# Patient Record
Sex: Male | Born: 1955 | Race: Black or African American | Hispanic: No | Marital: Single | Smoking: Current every day smoker
Health system: Southern US, Community
[De-identification: ages and names within clinical notes are randomized; demographics above are authoritative.]

## PROBLEM LIST (undated history)

## (undated) DIAGNOSIS — K5790 Diverticulosis of intestine, part unspecified, without perforation or abscess without bleeding: Secondary | ICD-10-CM

## (undated) DIAGNOSIS — T783XXA Angioneurotic edema, initial encounter: Secondary | ICD-10-CM

## (undated) DIAGNOSIS — I639 Cerebral infarction, unspecified: Secondary | ICD-10-CM

## (undated) DIAGNOSIS — M1990 Unspecified osteoarthritis, unspecified site: Secondary | ICD-10-CM

## (undated) DIAGNOSIS — D649 Anemia, unspecified: Secondary | ICD-10-CM

## (undated) DIAGNOSIS — F079 Unspecified personality and behavioral disorder due to known physiological condition: Secondary | ICD-10-CM

## (undated) DIAGNOSIS — R569 Unspecified convulsions: Secondary | ICD-10-CM

## (undated) DIAGNOSIS — E119 Type 2 diabetes mellitus without complications: Secondary | ICD-10-CM

## (undated) DIAGNOSIS — R55 Syncope and collapse: Secondary | ICD-10-CM

## (undated) DIAGNOSIS — E785 Hyperlipidemia, unspecified: Secondary | ICD-10-CM

## (undated) DIAGNOSIS — R609 Edema, unspecified: Secondary | ICD-10-CM

## (undated) DIAGNOSIS — I4901 Ventricular fibrillation: Secondary | ICD-10-CM

## (undated) DIAGNOSIS — F1010 Alcohol abuse, uncomplicated: Secondary | ICD-10-CM

## (undated) DIAGNOSIS — F418 Other specified anxiety disorders: Secondary | ICD-10-CM

## (undated) DIAGNOSIS — K861 Other chronic pancreatitis: Secondary | ICD-10-CM

## (undated) DIAGNOSIS — K2970 Gastritis, unspecified, without bleeding: Secondary | ICD-10-CM

## (undated) DIAGNOSIS — K635 Polyp of colon: Secondary | ICD-10-CM

## (undated) DIAGNOSIS — I10 Essential (primary) hypertension: Secondary | ICD-10-CM

## (undated) DIAGNOSIS — G8929 Other chronic pain: Secondary | ICD-10-CM

## (undated) DIAGNOSIS — J449 Chronic obstructive pulmonary disease, unspecified: Secondary | ICD-10-CM

## (undated) DIAGNOSIS — I219 Acute myocardial infarction, unspecified: Secondary | ICD-10-CM

## (undated) DIAGNOSIS — J189 Pneumonia, unspecified organism: Secondary | ICD-10-CM

## (undated) DIAGNOSIS — K750 Abscess of liver: Secondary | ICD-10-CM

## (undated) DIAGNOSIS — K4090 Unilateral inguinal hernia, without obstruction or gangrene, not specified as recurrent: Secondary | ICD-10-CM

## (undated) DIAGNOSIS — K7460 Unspecified cirrhosis of liver: Secondary | ICD-10-CM

## (undated) DIAGNOSIS — Z8673 Personal history of transient ischemic attack (TIA), and cerebral infarction without residual deficits: Secondary | ICD-10-CM

## (undated) DIAGNOSIS — G931 Anoxic brain damage, not elsewhere classified: Secondary | ICD-10-CM

## (undated) DIAGNOSIS — F17200 Nicotine dependence, unspecified, uncomplicated: Secondary | ICD-10-CM

## (undated) DIAGNOSIS — K219 Gastro-esophageal reflux disease without esophagitis: Secondary | ICD-10-CM

## (undated) HISTORY — DX: Cerebral infarction, unspecified: I63.9

## (undated) HISTORY — DX: Diverticulosis of intestine, part unspecified, without perforation or abscess without bleeding: K57.90

## (undated) HISTORY — DX: Unspecified cirrhosis of liver: K74.60

## (undated) HISTORY — DX: Gastro-esophageal reflux disease without esophagitis: K21.9

## (undated) HISTORY — DX: Anemia, unspecified: D64.9

## (undated) HISTORY — DX: Gastritis, unspecified, without bleeding: K29.70

## (undated) HISTORY — DX: Hyperlipidemia, unspecified: E78.5

## (undated) HISTORY — DX: Ventricular fibrillation: I49.01

## (undated) HISTORY — DX: Abscess of liver: K75.0

## (undated) HISTORY — DX: Acute myocardial infarction, unspecified: I21.9

## (undated) HISTORY — DX: Nicotine dependence, unspecified, uncomplicated: F17.200

## (undated) HISTORY — PX: OTHER SURGICAL HISTORY: SHX169

## (undated) HISTORY — DX: Unspecified osteoarthritis, unspecified site: M19.90

## (undated) HISTORY — DX: Personal history of transient ischemic attack (TIA), and cerebral infarction without residual deficits: Z86.73

## (undated) HISTORY — DX: Alcohol abuse, uncomplicated: F10.10

## (undated) HISTORY — DX: Unilateral inguinal hernia, without obstruction or gangrene, not specified as recurrent: K40.90

## (undated) HISTORY — DX: Unspecified personality and behavioral disorder due to known physiological condition: F07.9

## (undated) HISTORY — DX: Chronic obstructive pulmonary disease, unspecified: J44.9

## (undated) HISTORY — DX: Other chronic pancreatitis: K86.1

## (undated) HISTORY — DX: Unspecified convulsions: R56.9

## (undated) HISTORY — DX: Other chronic pain: G89.29

## (undated) HISTORY — DX: Anoxic brain damage, not elsewhere classified: G93.1

## (undated) HISTORY — DX: Angioneurotic edema, initial encounter: T78.3XXA

## (undated) HISTORY — DX: Edema, unspecified: R60.9

## (undated) HISTORY — DX: Polyp of colon: K63.5

## (undated) HISTORY — DX: Essential (primary) hypertension: I10

---
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
## 2007-07-28 ENCOUNTER — Inpatient Hospital Stay (HOSPITAL_COMMUNITY): Admission: EM | Admit: 2007-07-28 | Discharge: 2007-08-01 | Payer: Self-pay | Admitting: Emergency Medicine

## 2007-07-28 ENCOUNTER — Ambulatory Visit: Payer: Self-pay | Admitting: Family Medicine

## 2007-08-20 ENCOUNTER — Encounter: Payer: Self-pay | Admitting: Family Medicine

## 2007-08-21 ENCOUNTER — Encounter: Payer: Self-pay | Admitting: Family Medicine

## 2008-02-28 ENCOUNTER — Inpatient Hospital Stay (HOSPITAL_COMMUNITY): Admission: EM | Admit: 2008-02-28 | Discharge: 2008-03-03 | Payer: Self-pay | Admitting: Emergency Medicine

## 2008-02-28 ENCOUNTER — Ambulatory Visit: Payer: Self-pay | Admitting: Pulmonary Disease

## 2008-03-10 ENCOUNTER — Ambulatory Visit: Payer: Self-pay | Admitting: Internal Medicine

## 2008-03-10 DIAGNOSIS — T783XXA Angioneurotic edema, initial encounter: Secondary | ICD-10-CM | POA: Insufficient documentation

## 2008-03-10 DIAGNOSIS — F17200 Nicotine dependence, unspecified, uncomplicated: Secondary | ICD-10-CM | POA: Insufficient documentation

## 2008-03-10 DIAGNOSIS — J9600 Acute respiratory failure, unspecified whether with hypoxia or hypercapnia: Secondary | ICD-10-CM | POA: Insufficient documentation

## 2008-03-10 DIAGNOSIS — I10 Essential (primary) hypertension: Secondary | ICD-10-CM | POA: Insufficient documentation

## 2009-01-03 ENCOUNTER — Ambulatory Visit: Payer: Self-pay | Admitting: Internal Medicine

## 2009-01-03 ENCOUNTER — Ambulatory Visit: Payer: Self-pay | Admitting: Cardiology

## 2009-01-03 ENCOUNTER — Inpatient Hospital Stay (HOSPITAL_COMMUNITY): Admission: EM | Admit: 2009-01-03 | Discharge: 2009-01-07 | Payer: Self-pay | Admitting: Emergency Medicine

## 2009-01-05 ENCOUNTER — Encounter (INDEPENDENT_AMBULATORY_CARE_PROVIDER_SITE_OTHER): Payer: Self-pay | Admitting: Internal Medicine

## 2009-01-06 ENCOUNTER — Encounter: Payer: Self-pay | Admitting: Internal Medicine

## 2009-01-26 ENCOUNTER — Encounter: Payer: Self-pay | Admitting: Internal Medicine

## 2009-01-26 ENCOUNTER — Ambulatory Visit: Payer: Self-pay | Admitting: Infectious Disease

## 2009-01-26 DIAGNOSIS — F1010 Alcohol abuse, uncomplicated: Secondary | ICD-10-CM | POA: Insufficient documentation

## 2009-01-26 DIAGNOSIS — H5310 Unspecified subjective visual disturbances: Secondary | ICD-10-CM | POA: Insufficient documentation

## 2009-01-26 DIAGNOSIS — F528 Other sexual dysfunction not due to a substance or known physiological condition: Secondary | ICD-10-CM | POA: Insufficient documentation

## 2009-01-26 DIAGNOSIS — R350 Frequency of micturition: Secondary | ICD-10-CM | POA: Insufficient documentation

## 2009-01-26 LAB — CONVERTED CEMR LAB
Bilirubin Urine: NEGATIVE
Hemoglobin, Urine: NEGATIVE
Ketones, ur: NEGATIVE mg/dL
Leukocytes, UA: NEGATIVE
Nitrite: NEGATIVE
Protein, ur: NEGATIVE mg/dL
Specific Gravity, Urine: 1.01 (ref 1.005–1.030)
Urine Glucose: NEGATIVE mg/dL
Urobilinogen, UA: 0.2 (ref 0.0–1.0)
pH: 6 (ref 5.0–8.0)

## 2009-01-27 ENCOUNTER — Encounter: Payer: Self-pay | Admitting: Internal Medicine

## 2009-10-10 DIAGNOSIS — G8929 Other chronic pain: Secondary | ICD-10-CM

## 2009-10-10 DIAGNOSIS — R569 Unspecified convulsions: Secondary | ICD-10-CM

## 2009-10-10 DIAGNOSIS — E785 Hyperlipidemia, unspecified: Secondary | ICD-10-CM

## 2009-10-10 DIAGNOSIS — K7460 Unspecified cirrhosis of liver: Secondary | ICD-10-CM

## 2009-10-10 DIAGNOSIS — I4901 Ventricular fibrillation: Secondary | ICD-10-CM

## 2009-10-10 DIAGNOSIS — K4090 Unilateral inguinal hernia, without obstruction or gangrene, not specified as recurrent: Secondary | ICD-10-CM

## 2009-10-10 DIAGNOSIS — I10 Essential (primary) hypertension: Secondary | ICD-10-CM

## 2009-10-10 DIAGNOSIS — Z8673 Personal history of transient ischemic attack (TIA), and cerebral infarction without residual deficits: Secondary | ICD-10-CM

## 2009-10-10 DIAGNOSIS — I219 Acute myocardial infarction, unspecified: Secondary | ICD-10-CM

## 2009-10-10 HISTORY — DX: Unilateral inguinal hernia, without obstruction or gangrene, not specified as recurrent: K40.90

## 2009-10-10 HISTORY — DX: Ventricular fibrillation: I49.01

## 2009-10-10 HISTORY — DX: Acute myocardial infarction, unspecified: I21.9

## 2009-10-10 HISTORY — DX: Personal history of transient ischemic attack (TIA), and cerebral infarction without residual deficits: Z86.73

## 2009-10-10 HISTORY — DX: Unspecified convulsions: R56.9

## 2009-10-10 HISTORY — DX: Unspecified cirrhosis of liver: K74.60

## 2009-10-10 HISTORY — PX: CARDIAC CATHETERIZATION: SHX172

## 2009-10-10 HISTORY — DX: Other chronic pain: G89.29

## 2009-10-10 HISTORY — DX: Essential (primary) hypertension: I10

## 2009-10-10 HISTORY — DX: Hyperlipidemia, unspecified: E78.5

## 2010-02-03 ENCOUNTER — Encounter: Payer: Self-pay | Admitting: Physician Assistant

## 2010-02-03 ENCOUNTER — Inpatient Hospital Stay (HOSPITAL_COMMUNITY): Admission: EM | Admit: 2010-02-03 | Discharge: 2010-02-09 | Payer: Self-pay | Admitting: Emergency Medicine

## 2010-02-09 ENCOUNTER — Encounter: Payer: Self-pay | Admitting: Physician Assistant

## 2010-02-23 ENCOUNTER — Telehealth: Payer: Self-pay | Admitting: Physician Assistant

## 2010-02-23 ENCOUNTER — Encounter: Payer: Self-pay | Admitting: Physician Assistant

## 2010-02-23 ENCOUNTER — Ambulatory Visit: Payer: Self-pay | Admitting: Internal Medicine

## 2010-02-23 DIAGNOSIS — K861 Other chronic pancreatitis: Secondary | ICD-10-CM | POA: Insufficient documentation

## 2010-02-23 DIAGNOSIS — R079 Chest pain, unspecified: Secondary | ICD-10-CM | POA: Insufficient documentation

## 2010-02-23 DIAGNOSIS — K7030 Alcoholic cirrhosis of liver without ascites: Secondary | ICD-10-CM | POA: Insufficient documentation

## 2010-02-26 ENCOUNTER — Telehealth: Payer: Self-pay | Admitting: Physician Assistant

## 2010-03-05 ENCOUNTER — Telehealth: Payer: Self-pay | Admitting: Physician Assistant

## 2010-03-09 ENCOUNTER — Ambulatory Visit: Payer: Self-pay | Admitting: Physician Assistant

## 2010-03-15 ENCOUNTER — Encounter: Payer: Self-pay | Admitting: Physician Assistant

## 2010-03-15 ENCOUNTER — Encounter (INDEPENDENT_AMBULATORY_CARE_PROVIDER_SITE_OTHER): Payer: Self-pay | Admitting: *Deleted

## 2010-03-15 DIAGNOSIS — E785 Hyperlipidemia, unspecified: Secondary | ICD-10-CM | POA: Insufficient documentation

## 2010-03-15 DIAGNOSIS — D649 Anemia, unspecified: Secondary | ICD-10-CM | POA: Insufficient documentation

## 2010-03-15 LAB — CONVERTED CEMR LAB
ALT: <8 units/L (ref 0–53)
AST: 9 units/L (ref 0–37)
Albumin: 3.7 g/dL (ref 3.5–5.2)
Alkaline Phosphatase: 66 units/L (ref 39–117)
Amphetamine Screen, Ur: NEGATIVE
BUN: 13 mg/dL (ref 6–23)
Barbiturate Quant, Ur: NEGATIVE
Basophils Absolute: 0 10*3/uL (ref 0.0–0.1)
Basophils Relative: 0 % (ref 0–1)
Benzodiazepines.: NEGATIVE
CO2: 28 meq/L (ref 19–32)
Calcium: 9.5 mg/dL (ref 8.4–10.5)
Chloride: 99 meq/L (ref 96–112)
Cholesterol, target level: 200 mg/dL
Cholesterol: 138 mg/dL (ref 0–200)
Cocaine Metabolites: NEGATIVE
Creatinine, Ser: 0.75 mg/dL (ref 0.40–1.50)
Creatinine,U: 375.5 mg/dL
Eosinophils Absolute: 0.2 10*3/uL (ref 0.0–0.7)
Eosinophils Relative: 2 % (ref 0–5)
Glucose, Bld: 104 mg/dL — ABNORMAL HIGH (ref 70–99)
HCT: 38.5 % — ABNORMAL LOW (ref 39.0–52.0)
HDL goal, serum: 40 mg/dL
HDL: 19 mg/dL — ABNORMAL LOW (ref >39)
Hemoglobin: 11.7 g/dL — ABNORMAL LOW (ref 13.0–17.0)
LDL Cholesterol: 103 mg/dL — ABNORMAL HIGH (ref 0–99)
LDL Goal: 100 mg/dL
Lymphocytes Relative: 18 % (ref 12–46)
Lymphs Abs: 1.8 10*3/uL (ref 0.7–4.0)
MCHC: 30.4 g/dL (ref 30.0–36.0)
MCV: 89.1 fL (ref 78.0–100.0)
Marijuana Metabolite: NEGATIVE
Methadone: NEGATIVE
Monocytes Absolute: 0.6 10*3/uL (ref 0.1–1.0)
Monocytes Relative: 7 % (ref 3–12)
Neutro Abs: 7 10*3/uL (ref 1.7–7.7)
Neutrophils Relative %: 73 % (ref 43–77)
Opiate Screen, Urine: NEGATIVE
Phencyclidine (PCP): NEGATIVE
Platelets: 494 10*3/uL — ABNORMAL HIGH (ref 150–400)
Potassium: 4.5 meq/L (ref 3.5–5.3)
Propoxyphene: NEGATIVE
RBC: 4.32 M/uL (ref 4.22–5.81)
RDW: 17.9 % — ABNORMAL HIGH (ref 11.5–15.5)
Sodium: 139 meq/L (ref 135–145)
TSH: 2.823 microintl units/mL (ref 0.350–4.500)
Total Bilirubin: 0.8 mg/dL (ref 0.3–1.2)
Total CHOL/HDL Ratio: 7.3
Total Protein: 6.9 g/dL (ref 6.0–8.3)
Triglycerides: 80 mg/dL (ref <150)
VLDL: 16 mg/dL (ref 0–40)
WBC: 9.6 10*3/uL (ref 4.0–10.5)

## 2010-03-17 ENCOUNTER — Telehealth: Payer: Self-pay | Admitting: Physician Assistant

## 2010-03-18 ENCOUNTER — Ambulatory Visit: Payer: Self-pay | Admitting: Physician Assistant

## 2010-03-19 ENCOUNTER — Encounter: Payer: Self-pay | Admitting: Physician Assistant

## 2010-03-19 LAB — CONVERTED CEMR LAB
ALT: <8 units/L (ref 0–53)
AST: 11 units/L (ref 0–37)
Albumin: 3.7 g/dL (ref 3.5–5.2)
Alkaline Phosphatase: 70 units/L (ref 39–117)
BUN: 10 mg/dL (ref 6–23)
CO2: 25 meq/L (ref 19–32)
Calcium: 9.7 mg/dL (ref 8.4–10.5)
Chloride: 100 meq/L (ref 96–112)
Creatinine, Ser: 0.73 mg/dL (ref 0.40–1.50)
Ferritin: 1211 ng/mL — ABNORMAL HIGH (ref 22–322)
Glucose, Bld: 84 mg/dL (ref 70–99)
Iron: 20 ug/dL — ABNORMAL LOW (ref 42–165)
Lipase: 539 units/L — ABNORMAL HIGH (ref 0–75)
Potassium: 4.9 meq/L (ref 3.5–5.3)
RBC Folate: 393 ng/mL (ref 180–600)
Sodium: 140 meq/L (ref 135–145)
Total Bilirubin: 0.6 mg/dL (ref 0.3–1.2)
Total Protein: 7.1 g/dL (ref 6.0–8.3)
Vitamin B-12: 200 pg/mL — ABNORMAL LOW (ref 211–911)

## 2010-03-22 ENCOUNTER — Telehealth: Payer: Self-pay | Admitting: Physician Assistant

## 2010-03-22 ENCOUNTER — Encounter: Payer: Self-pay | Admitting: Physician Assistant

## 2010-03-24 ENCOUNTER — Inpatient Hospital Stay (HOSPITAL_COMMUNITY): Admission: EM | Admit: 2010-03-24 | Discharge: 2010-04-23 | Payer: Self-pay | Admitting: Emergency Medicine

## 2010-03-24 ENCOUNTER — Ambulatory Visit: Payer: Self-pay | Admitting: Pulmonary Disease

## 2010-03-24 ENCOUNTER — Ambulatory Visit: Payer: Self-pay | Admitting: Cardiology

## 2010-03-25 ENCOUNTER — Encounter: Payer: Self-pay | Admitting: Cardiology

## 2010-03-25 ENCOUNTER — Encounter: Payer: Self-pay | Admitting: Pulmonary Disease

## 2010-03-25 DIAGNOSIS — E538 Deficiency of other specified B group vitamins: Secondary | ICD-10-CM | POA: Insufficient documentation

## 2010-03-26 ENCOUNTER — Encounter: Payer: Self-pay | Admitting: Cardiology

## 2010-03-30 ENCOUNTER — Ambulatory Visit: Payer: Self-pay | Admitting: Vascular Surgery

## 2010-03-30 ENCOUNTER — Encounter (INDEPENDENT_AMBULATORY_CARE_PROVIDER_SITE_OTHER): Payer: Self-pay | Admitting: Diagnostic Neuroimaging

## 2010-04-03 ENCOUNTER — Encounter: Payer: Self-pay | Admitting: Internal Medicine

## 2010-04-09 ENCOUNTER — Encounter: Payer: Self-pay | Admitting: Cardiology

## 2010-04-13 ENCOUNTER — Encounter: Payer: Self-pay | Admitting: Cardiology

## 2010-04-23 ENCOUNTER — Encounter: Payer: Self-pay | Admitting: Internal Medicine

## 2010-04-26 ENCOUNTER — Encounter: Payer: Self-pay | Admitting: Physician Assistant

## 2010-04-26 ENCOUNTER — Encounter: Payer: Self-pay | Admitting: Internal Medicine

## 2010-05-04 ENCOUNTER — Inpatient Hospital Stay (HOSPITAL_COMMUNITY): Admission: EM | Admit: 2010-05-04 | Discharge: 2010-05-06 | Payer: Self-pay | Admitting: Emergency Medicine

## 2010-05-04 ENCOUNTER — Ambulatory Visit: Payer: Self-pay | Admitting: Cardiology

## 2010-05-10 DEATH — deceased

## 2010-05-13 ENCOUNTER — Inpatient Hospital Stay (HOSPITAL_COMMUNITY): Admission: EM | Admit: 2010-05-13 | Discharge: 2010-05-17 | Payer: Self-pay | Admitting: Emergency Medicine

## 2010-05-13 ENCOUNTER — Encounter: Payer: Self-pay | Admitting: Internal Medicine

## 2010-05-17 ENCOUNTER — Encounter: Payer: Self-pay | Admitting: Physician Assistant

## 2010-05-17 ENCOUNTER — Encounter: Payer: Self-pay | Admitting: Internal Medicine

## 2010-05-18 ENCOUNTER — Encounter: Payer: Self-pay | Admitting: Internal Medicine

## 2010-05-24 ENCOUNTER — Ambulatory Visit: Payer: Self-pay | Admitting: Internal Medicine

## 2010-05-24 DIAGNOSIS — I469 Cardiac arrest, cause unspecified: Secondary | ICD-10-CM | POA: Insufficient documentation

## 2010-06-02 ENCOUNTER — Inpatient Hospital Stay (HOSPITAL_COMMUNITY): Admission: EM | Admit: 2010-06-02 | Discharge: 2010-06-03 | Payer: Self-pay | Admitting: Emergency Medicine

## 2010-06-03 ENCOUNTER — Encounter: Payer: Self-pay | Admitting: Physician Assistant

## 2010-06-04 ENCOUNTER — Emergency Department (HOSPITAL_COMMUNITY): Admission: EM | Admit: 2010-06-04 | Discharge: 2010-06-04 | Payer: Self-pay | Admitting: Emergency Medicine

## 2010-06-05 ENCOUNTER — Encounter: Payer: Self-pay | Admitting: Physician Assistant

## 2010-06-16 ENCOUNTER — Ambulatory Visit: Payer: Self-pay | Admitting: Physician Assistant

## 2010-06-29 ENCOUNTER — Emergency Department (HOSPITAL_COMMUNITY): Admission: EM | Admit: 2010-06-29 | Discharge: 2010-06-29 | Payer: Self-pay | Admitting: Emergency Medicine

## 2010-06-30 ENCOUNTER — Encounter (INDEPENDENT_AMBULATORY_CARE_PROVIDER_SITE_OTHER): Payer: Self-pay | Admitting: Internal Medicine

## 2010-07-07 ENCOUNTER — Ambulatory Visit: Payer: Self-pay | Admitting: Physician Assistant

## 2010-07-07 DIAGNOSIS — G931 Anoxic brain damage, not elsewhere classified: Secondary | ICD-10-CM | POA: Insufficient documentation

## 2010-07-07 DIAGNOSIS — R569 Unspecified convulsions: Secondary | ICD-10-CM | POA: Insufficient documentation

## 2010-07-08 LAB — CONVERTED CEMR LAB
Amphetamine Screen, Ur: NEGATIVE
Barbiturate Quant, Ur: NEGATIVE
Basophils Absolute: 0 10*3/uL (ref 0.0–0.1)
Basophils Relative: 0 % (ref 0–1)
Benzodiazepines.: NEGATIVE
Cocaine Metabolites: NEGATIVE
Creatinine,U: 292 mg/dL
Eosinophils Absolute: 0.3 10*3/uL (ref 0.0–0.7)
Eosinophils Relative: 5 % (ref 0–5)
Ferritin: 945 ng/mL — ABNORMAL HIGH (ref 22–322)
HCT: 33.3 % — ABNORMAL LOW (ref 39.0–52.0)
Hemoglobin: 9.8 g/dL — ABNORMAL LOW (ref 13.0–17.0)
Iron: 22 ug/dL — ABNORMAL LOW (ref 42–165)
Lipase: 555 units/L — ABNORMAL HIGH (ref 0–75)
Lymphocytes Relative: 24 % (ref 12–46)
Lymphs Abs: 1.7 10*3/uL (ref 0.7–4.0)
MCHC: 29.4 g/dL — ABNORMAL LOW (ref 30.0–36.0)
MCV: 88.8 fL (ref 78.0–100.0)
Marijuana Metabolite: NEGATIVE
Methadone: NEGATIVE
Monocytes Absolute: 0.4 10*3/uL (ref 0.1–1.0)
Monocytes Relative: 6 % (ref 3–12)
Neutro Abs: 4.7 10*3/uL (ref 1.7–7.7)
Neutrophils Relative %: 65 % (ref 43–77)
Opiates: POSITIVE — AB
Phencyclidine (PCP): NEGATIVE
Platelets: 410 10*3/uL — ABNORMAL HIGH (ref 150–400)
Propoxyphene: NEGATIVE
RBC: 3.75 M/uL — ABNORMAL LOW (ref 4.22–5.81)
RDW: 17.8 % — ABNORMAL HIGH (ref 11.5–15.5)
Saturation Ratios: 11 % — ABNORMAL LOW (ref 20–55)
TIBC: 192 ug/dL — ABNORMAL LOW (ref 215–435)
UIBC: 170 ug/dL
WBC: 7.2 10*3/uL (ref 4.0–10.5)

## 2010-07-12 ENCOUNTER — Telehealth: Payer: Self-pay | Admitting: Physician Assistant

## 2010-07-13 ENCOUNTER — Telehealth: Payer: Self-pay | Admitting: Physician Assistant

## 2010-07-13 ENCOUNTER — Encounter: Payer: Self-pay | Admitting: Physician Assistant

## 2010-07-19 ENCOUNTER — Encounter: Payer: Self-pay | Admitting: Physician Assistant

## 2010-07-19 ENCOUNTER — Telehealth (INDEPENDENT_AMBULATORY_CARE_PROVIDER_SITE_OTHER): Payer: Self-pay | Admitting: *Deleted

## 2010-07-20 ENCOUNTER — Encounter: Payer: Self-pay | Admitting: Physician Assistant

## 2010-07-26 ENCOUNTER — Encounter: Payer: Self-pay | Admitting: Physician Assistant

## 2010-07-29 ENCOUNTER — Encounter: Payer: Self-pay | Admitting: Physician Assistant

## 2010-07-29 ENCOUNTER — Inpatient Hospital Stay (HOSPITAL_COMMUNITY): Admission: EM | Admit: 2010-07-29 | Discharge: 2010-08-14 | Payer: Self-pay | Admitting: Emergency Medicine

## 2010-07-29 ENCOUNTER — Encounter: Payer: Self-pay | Admitting: Internal Medicine

## 2010-07-30 ENCOUNTER — Ambulatory Visit: Payer: Self-pay | Admitting: Internal Medicine

## 2010-08-03 ENCOUNTER — Encounter: Payer: Self-pay | Admitting: Physician Assistant

## 2010-08-04 ENCOUNTER — Telehealth (INDEPENDENT_AMBULATORY_CARE_PROVIDER_SITE_OTHER): Payer: Self-pay | Admitting: Nurse Practitioner

## 2010-08-11 ENCOUNTER — Encounter (INDEPENDENT_AMBULATORY_CARE_PROVIDER_SITE_OTHER): Payer: Self-pay | Admitting: *Deleted

## 2010-08-13 ENCOUNTER — Encounter: Payer: Self-pay | Admitting: Internal Medicine

## 2010-08-13 DIAGNOSIS — K750 Abscess of liver: Secondary | ICD-10-CM | POA: Insufficient documentation

## 2010-08-19 ENCOUNTER — Emergency Department (HOSPITAL_COMMUNITY): Admission: EM | Admit: 2010-08-19 | Discharge: 2010-08-19 | Payer: Self-pay | Admitting: Emergency Medicine

## 2010-08-20 ENCOUNTER — Emergency Department (HOSPITAL_COMMUNITY): Admission: EM | Admit: 2010-08-20 | Discharge: 2010-08-20 | Payer: Self-pay | Admitting: Emergency Medicine

## 2010-08-24 ENCOUNTER — Ambulatory Visit: Payer: Self-pay | Admitting: Internal Medicine

## 2010-08-24 DIAGNOSIS — R609 Edema, unspecified: Secondary | ICD-10-CM | POA: Insufficient documentation

## 2010-08-24 DIAGNOSIS — J069 Acute upper respiratory infection, unspecified: Secondary | ICD-10-CM | POA: Insufficient documentation

## 2010-08-25 ENCOUNTER — Inpatient Hospital Stay (HOSPITAL_COMMUNITY): Admission: EM | Admit: 2010-08-25 | Discharge: 2010-09-07 | Payer: Self-pay | Admitting: Emergency Medicine

## 2010-09-07 ENCOUNTER — Encounter (INDEPENDENT_AMBULATORY_CARE_PROVIDER_SITE_OTHER): Payer: Self-pay | Admitting: Internal Medicine

## 2010-09-16 ENCOUNTER — Ambulatory Visit: Payer: Self-pay | Admitting: Physician Assistant

## 2010-09-21 ENCOUNTER — Telehealth (INDEPENDENT_AMBULATORY_CARE_PROVIDER_SITE_OTHER): Payer: Self-pay | Admitting: Internal Medicine

## 2010-09-21 ENCOUNTER — Encounter (INDEPENDENT_AMBULATORY_CARE_PROVIDER_SITE_OTHER): Payer: Self-pay | Admitting: Internal Medicine

## 2010-10-01 ENCOUNTER — Encounter: Payer: Self-pay | Admitting: Internal Medicine

## 2010-10-01 ENCOUNTER — Ambulatory Visit: Payer: Self-pay | Admitting: Internal Medicine

## 2010-10-05 ENCOUNTER — Telehealth (INDEPENDENT_AMBULATORY_CARE_PROVIDER_SITE_OTHER): Payer: Self-pay | Admitting: Internal Medicine

## 2010-10-08 ENCOUNTER — Ambulatory Visit: Payer: Self-pay | Admitting: Internal Medicine

## 2010-10-08 DIAGNOSIS — F411 Generalized anxiety disorder: Secondary | ICD-10-CM | POA: Insufficient documentation

## 2010-10-12 ENCOUNTER — Telehealth (INDEPENDENT_AMBULATORY_CARE_PROVIDER_SITE_OTHER): Payer: Self-pay | Admitting: Internal Medicine

## 2010-10-14 ENCOUNTER — Telehealth (INDEPENDENT_AMBULATORY_CARE_PROVIDER_SITE_OTHER): Payer: Self-pay | Admitting: *Deleted

## 2010-10-18 ENCOUNTER — Telehealth (INDEPENDENT_AMBULATORY_CARE_PROVIDER_SITE_OTHER): Payer: Self-pay | Admitting: Internal Medicine

## 2010-10-20 ENCOUNTER — Encounter (INDEPENDENT_AMBULATORY_CARE_PROVIDER_SITE_OTHER): Payer: Self-pay | Admitting: Internal Medicine

## 2010-10-28 ENCOUNTER — Telehealth (INDEPENDENT_AMBULATORY_CARE_PROVIDER_SITE_OTHER): Payer: Self-pay | Admitting: Internal Medicine

## 2010-11-02 ENCOUNTER — Ambulatory Visit
Admission: RE | Admit: 2010-11-02 | Discharge: 2010-11-02 | Payer: Self-pay | Source: Home / Self Care | Attending: Internal Medicine | Admitting: Internal Medicine

## 2010-11-02 DIAGNOSIS — M79609 Pain in unspecified limb: Secondary | ICD-10-CM | POA: Insufficient documentation

## 2010-11-06 ENCOUNTER — Telehealth (INDEPENDENT_AMBULATORY_CARE_PROVIDER_SITE_OTHER): Payer: Self-pay | Admitting: Internal Medicine

## 2010-11-07 LAB — CONVERTED CEMR LAB
Homocysteine: 20.7 micromoles/L — ABNORMAL HIGH (ref 4.0–15.4)
Iron: 23 ug/dL — ABNORMAL LOW (ref 42–165)
Saturation Ratios: 11 % — ABNORMAL LOW (ref 20–55)
TIBC: 206 ug/dL — ABNORMAL LOW (ref 215–435)
UIBC: 183 ug/dL

## 2010-11-09 ENCOUNTER — Encounter (INDEPENDENT_AMBULATORY_CARE_PROVIDER_SITE_OTHER): Payer: Self-pay | Admitting: Internal Medicine

## 2010-11-09 NOTE — Assessment & Plan Note (Signed)
Summary: Chronic Pancreatitis   Vital Signs:  Patient profile:   55 year old male Height:      68 inches Weight:      97.8 pounds BMI:     14.92 Temp:     97.7 degrees F oral Pulse rate:   96 / minute Pulse rhythm:   regular Resp:     18 per minute BP sitting:   100 / 65  (left arm) Cuff size:   regular  Vitals Entered By: [place] Shannon (July 07, 2010 11:07 AM) CC: meds refill... appt for scheduled...Marland KitchenMarland Kitchen pt says he needs something stronger for pain... Is Patient Diabetic? No Pain Assessment Patient in pain? no       Does patient need assistance? Functional Status Self care Ambulation Normal   Primary Care Provider:  Richardson Dopp, PA-C  CC:  meds refill... appt for scheduled...Marland KitchenMarland Kitchen pt says he needs something stronger for pain....  History of Present Illness: Steven Frey returns today for followup.  He was released from the nursing home about 2 weeks ago.  He comes in today for reestablishment.  His history is outlined in previous notes.  Basically, he has chronic pancreatitis and chronic pain related to this.  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  He suffered an anoxic brain injury from this.  This has resulted in seizures which are controlled on Keppra.  His Depakote was discontinued a couple of months ago due to the fear that this was causing acute episodes of pancreatitis.  He has had several trips to the emergency room as well as to the hospital for acute on chronic pancreatitis.  He has had several CT scans of his abdomen.  The most recent as noted below.  The patient is on multiple medications.  I have tried to reconcile those as much as possible.  The one medication that is still outstanding as Dilaudid.  This was apparently given to him for pain.  I believe he was taking 2 mg every 4-6 hours.  I cannot document that this has been prescribed to him through the pharmacy.  I have requested that the nursing home send me his medication  list.  Once I receive this, I will fill his medicine or change to a pain medication I am comfortable giving to him.  He has recurrent bouts of abdominal pain.  This is mainly epigastric.  He does feel it up into his chest.  He has a poor appetite.  He is very cachectic.  He says he eats sometimes.  He is not really drinking boost 3 times a day like he should be.  He has bowel movements every couple of days.  He vomited about 2 weeks ago.  He denies any blood in his stools are dark hard stools.  He is breathing okay.  He sleeping okay.  He denies any hematuria.  He denies any syncope.  He did get dizzy one day while walking.  He apparently has home health nursing and physical therapy coming to his house.  I've advised him to work with physical therapy so that he does not risk falling.  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 Uad 2)  Aspirin 81 Mg Tbec (Aspirin) .... Take One Tablet By Mouth Daily 3)  Keppra 500 Mg Tabs (Levetiracetam) .... Two Times A Day 4)  Ativan .... As Needed 5)  Crestor 40 Mg Tabs (Rosuvastatin Calcium) .... Take One Tablet By Mouth Daily. 6)  Vitamin B-1 100 Mg Tabs (Thiamine Hcl) .... Once Daily 7)  Prostate  Tabs (Specialty Vitamins Products) .... Once Daily 8)  Flora-Q  Caps  (Probiotic Product) .... Take One Daily 9)  Ferralet 90 90-1 Mg Tabs (Fe Cbn-Fe Gluc-Fa-B12-C-Dss) .... Take One Tab Daily 10)  Coreg 3.125 Mg Tabs (Carvedilol) .... Take One Tab Twice A Day 11)  Crestor 40 Mg Tabs (Rosuvastatin Calcium) .... Take One Tab At Bedtime 12)  Carafate 2 Gm Tabs (Sucralfate) .... Take One As Needed For Heart Burn 13)  Robaxin 500 Mg Tabs (Methocarbamol) .... Take One Tab As Needed For Spasms 14)  Ativan 1 Mg Tabs (Lorazepam) .... Take One Tab As Needed For Anxiety 15)  Dilaudid 2 Mg Tabs (Hydromorphone Hcl) .... Take One Tab Every Four Hours As Needed For Pain  Allergies (verified): 1)  ! Lisinopril  Past History:  Past Medical History: Reviewed history from 05/24/2010 and no changes required. 1. Angioedema rx requiring intubation/vent support suspected secondary to ACE I (but also on zithromax) 5/09 hospitalization 2. Smoker 3. Etoh abuse 4. Chronic Pancreatitis (10/08 hosp)     a.  admx 02/2010 . . . 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 Gastritis (alcohol induced) 7. Depression . . . no hx of meds 8. Borderline elevation in BP in past 9.VF arrest 6/11 successfully rescucitated with prolonged hospitalization due to respiratory failure QT prolongation during cooling, now resolved  Physical Exam  General:  cachetic.   Head:  normocephalic and atraumatic.   Mouth:  poor dentition and teeth missing.   Neck:  supple.   Lungs:  normal breath sounds.   Heart:  normal rate and regular rhythm.   Abdomen:  guarding, epigastric tenderness, and LUQ tenderness.   Neurologic:  cranial nerves II-XII intact.  noted weakness in RUE and RLE  Psych:  normally interactive, good eye contact, and not agitated.     Impression & Recommendations:  Problem # 1:  CHRONIC PANCREATITIS (ICD-577.1)  with chronic abd pain I am not comfortable chronically giving him dilaudid for this he needs to be followed by GI states he has seen someon and does  not remember who I will check with the nursing home to get a record  of his medicines he will sign a pain contract today I will need confirmation from the nursing home that he has been taking Dilaudid consistently If I cannot get this confirmation, I will prescribe him oxycodone to use If GI will not manage his chronic pain from pancreatitis, he will likely need to go to a pain clinic Lipase 391 in ED on 9/20  did get med list from RN home hydromorphone listed as 2 mg at bedtime as needed patient says he is taking something "every 4 hours or as needed" will give him oxycodone for now as above  Orders: T-Lipase [phone]) T-Drug Screen-Urine, (single) [phone])  Problem # 2:  ALCOHOL ABUSE (ICD-305.00) denies any alcohol at this time  Problem # 3:  VITAMIN B12 DEFICIENCY (ICD-266.2) this was discovered prior to his event in June will fill Rx for B12 and follow  Problem # 4:  ANEMIA (ICD-285.9)  Hgb 8.9 in ED 9/20 may be chronic disease may have chronic gi blood loss needs to see gi as noted above get stool cards check iron studies and start iron if low  The following medications were removed from the medication list:    Ferralet 90 90-1 Mg Tabs (Fe cbn-fe gluc-fa-b12-c-dss) .Marland Kitchen... Take one tab daily His updated medication list for this problem includes:    Folic Acid 1 Mg Tabs (Folic acid) .Marland Kitchen... Take 1 tablet by mouth once a day    Vitamin B-12 1000 Mcg Tabs (Cyanocobalamin) .Marland Kitchen... Take 1 tablet by mouth once a day  Orders: T-Hemoccult Cards-Multiple (82270) T-CBC w/Diff [id number]) T-Iron [phone]) T-Iron Binding Capacity (TIBC) ([phone]) T-Ferritin [id number])  Problem # 5:  CARDIAC ARREST (ICD-427.5) cont current meds f/u with card  His updated medication list for this problem includes:    Aspirin 81 Mg Tbec (Aspirin) .Marland Kitchen... Take one tablet by mouth daily    Coreg 3.125 Mg Tabs (Carvedilol) .Marland Kitchen... Take one tab twice a day for blood  pressure  Problem # 6:  ANOXIC BRAIN DAMAGE (ICD-348.1) residual right sided weakness working with PT at home  Problem # 7:  [place] (ICD-780.39) now on Keppra no seizures was on depakote, but d/c due to poss cause of pancreatitis exacerbation  His updated medication list for this problem includes:    Keppra 500 Mg Tabs (Levetiracetam) .Marland Kitchen... Take 1 tablet by mouth two times a day  Problem # 8:  HYPERTENSION, BENIGN (ICD-401.1) controlled  His updated medication list for this problem includes:    Coreg 3.125 Mg Tabs (Carvedilol) .Marland Kitchen... Take one tab twice a day for blood pressure  Complete Medication List: 1)  Creon 12000 Unit Cpep (Pancrelipase (lip-prot-amyl)) .... Three times a day before meals 2)  Aspirin 81 Mg Tbec (Aspirin) .... Take one tablet by mouth daily 3)  Keppra 500 Mg Tabs (Levetiracetam) .... Take 1 tablet by mouth two times a day 4)  Ativan 1 Mg Tabs (Lorazepam) .... Take one by mouth no more than every 8 hours as needed for anxiety or agitation 5)  Crestor 40 Mg Tabs (Rosuvastatin calcium) .... Take one tablet by mouth daily for cholesterol 6)  Vitamin B-1 100 Mg Tabs (Thiamine hcl) .... Take 1 tablet by mouth once a day 7)  Flora-q Caps (Probiotic product) .... Take one daily 8)  Coreg 3.125 Mg Tabs (Carvedilol) .... Take one tab twice a day for blood pressure 9)  Carafate 1 Gm Tabs (Sucralfate) .... Take one tab by mouth with meals as needed for stomach pain 10)  Robaxin 500 Mg Tabs (Methocarbamol) .... Take one by mouth every 6-8 hours as needed for spasms 11)  Nexium 40 Mg Cpdr (Esomeprazole magnesium) .... Take 1 capsule by mouth once a day for stomach 12)  Boost Liqd (Nutritional supplements) .... Drink one can three times a day 13)  Zofran 4 Mg Tabs (Ondansetron hcl) .... Take one by mouth two times a day as needed for nausea 14)  Folic Acid 1 Mg Tabs (Folic acid) .... Take 1 tablet by mouth once a day 15)  Multivitamins Tabs (Multiple vitamin) ....  Take 1 tablet by mouth once a  day - get this over the counter 16)  Nitrostat 0.4 Mg Subl (Nitroglycerin) .Marland Kitchen.. 1 sublingual as needed chest pain 17)  Vitamin B-12 1000 Mcg Tabs (Cyanocobalamin) .... Take 1 tablet by mouth once a day 18)  Oxycodone Hcl 10 Mg Tabs (Oxycodone hcl) .... Take one tab every 8 hours as needed for severe pain  Patient Instructions: 1)  Take all the medications listed below as directed.  If there is not a prescription for it, it can be obtained over the counter. 2)  Eat several small meals a day. 3)  Drink the Boost three times a day every day. 4)  The as needed medications are just that.  Do not take them unless you need them. 5)  I need to know who your gastroenterologist is.  Please call back with his name.  I would like the gastroenterologist to follow your pancreatitis and chronic pain.   6)  Schedule follow up here in 6 weeks. 7)  Complete your hemoccult cards and return them soon.  Prescriptions: OXYCODONE HCL 10 MG TABS (OXYCODONE HCL) Take one tab every 8 hours as needed for severe pain  #90 x 0   Entered and Authorized by:   Richardson Dopp PA-C   Signed by:   Richardson Dopp PA-C on 07/07/2010   Method used:   Print then Give to Patient   RxID:   [id number] VITAMIN B-12 1000 MCG TABS (CYANOCOBALAMIN) Take 1 tablet by mouth once a day  #30 x 11   Entered and Authorized by:   Richardson Dopp PA-C   Signed by:   Richardson Dopp PA-C on 07/07/2010   Method used:   Print then Give to Patient   RxID:   [id number] NITROSTAT 0.4 MG SUBL (NITROGLYCERIN) 1 sublingual as needed chest pain  #25 x 1   Entered and Authorized by:   Richardson Dopp PA-C   Signed by:   Richardson Dopp PA-C on 07/07/2010   Method used:   Print then Give to Patient   RxID:   [id number] FOLIC ACID 1 MG TABS (FOLIC ACID) Take 1 tablet by mouth once a day  #30 x 11   Entered and Authorized by:   Richardson Dopp PA-C   Signed by:   Richardson Dopp PA-C on 07/07/2010   Method used:   Print then  Give to Patient   RxID:   [id number] ZOFRAN 4 MG TABS (ONDANSETRON HCL) Take one by mouth two times a day as needed for nausea  #30 x 1   Entered and Authorized by:   Richardson Dopp PA-C   Signed by:   Richardson Dopp PA-C on 07/07/2010   Method used:   Print then Give to Patient   RxID:   [id number] [place] 40 MG CPDR (ESOMEPRAZOLE MAGNESIUM) Take 1 capsule by mouth once a day for stomach  #30 x 5   Entered and Authorized by:   Richardson Dopp PA-C   Signed by:   Richardson Dopp PA-C on 07/07/2010   Method used:   Print then Give to Patient   RxID:   [id number] ROBAXIN 500 MG TABS (METHOCARBAMOL) Take one by mouth every 6-8 hours as needed for spasms  #60 per month x 3   Entered and Authorized by:   Richardson Dopp PA-C   Signed by:   Richardson Dopp PA-C on 07/07/2010   Method used:   Print then Give to Patient   RxID:   [id number] CARAFATE 1 GM TABS (SUCRALFATE) Take one tab by mouth  with meals as needed for stomach pain  #90 x 3   Entered and Authorized by:   Richardson Dopp PA-C   Signed by:   Richardson Dopp PA-C on 07/07/2010   Method used:   Print then Give to Patient   RxID:   [id number] COREG 3.125 MG TABS (CARVEDILOL) take one tab twice a day for blood pressure  #60 x 5   Entered and Authorized by:   Richardson Dopp PA-C   Signed by:   Richardson Dopp PA-C on 07/07/2010   Method used:   Print then Give to Patient   RxID:   [id number] VITAMIN B-1 100 MG TABS (THIAMINE HCL) Take 1 tablet by mouth once a day  #30 x 11   Entered and Authorized by:   Richardson Dopp PA-C   Signed by:   Richardson Dopp PA-C on 07/07/2010   Method used:   Print then Give to Patient   RxID:   [id number] CRESTOR 40 MG TABS (ROSUVASTATIN CALCIUM) Take one tablet by mouth daily for cholesterol  #30 x 5   Entered and Authorized by:   Richardson Dopp PA-C   Signed by:   Richardson Dopp PA-C on 07/07/2010   Method used:   Print then Give to Patient   RxID:   [id number] ATIVAN 1 MG TABS  (LORAZEPAM) Take one by mouth no more than every 8 hours as needed for anxiety or agitation  #30 per month x 0   Entered and Authorized by:   Richardson Dopp PA-C   Signed by:   Richardson Dopp PA-C on 07/07/2010   Method used:   Print then Give to Patient   RxID:   [id number] KEPPRA 500 MG TABS (LEVETIRACETAM) Take 1 tablet by mouth two times a day  #60 x 5   Entered and Authorized by:   Richardson Dopp PA-C   Signed by:   Richardson Dopp PA-C on 07/07/2010   Method used:   Print then Give to Patient   RxID:   [id number] CREON 12000 UNIT CPEP (PANCRELIPASE (LIP-PROT-AMYL)) three times a day before meals  #90 x 5   Entered and Authorized by:   Richardson Dopp PA-C   Signed by:   Richardson Dopp PA-C on 07/07/2010   Method used:   Print then Give to Patient   RxID:   [id number]    CT Abdomen/Pelvis  Procedure date:  06/04/2010  Findings:      IMPRESSION:    1.  Progressive changes of acute and chronic pancreatitis in the   upper abdomen.   2.  No significant change in multiple pseudocysts.   3.  Small to moderate amount of free peritoneal fluid.   4.  Small to moderate-sized left pleural effusion.   5.  Left lower lobe atelectasis and minimal right lower lobe   atelectasis.   6.  Left inguinal hernia containing fat and fluid.

## 2010-11-09 NOTE — Progress Notes (Signed)
Summary: GI appt.  Phone Note Call from Patient   Summary of Call: Sister wants provider to know that the GI doctor they found is Dr. Shelda Altes at [facility] in [place].  September 01, 2010 at 1:15 pm.  Phone Number [phone] and fax is [phone].  Is still looking for one in [place]; if can't find one, will continue with appt. with Dr. Shelda Altes. Initial call taken by: Sherian Maroon RN,  July 13, 2010 3:23 PM  Follow-up for Phone Call        I want him referred to a GI in [place]. Order in system. Please make referral. Referral latter in system as well. Make sure his appt in [place] is canceled.  I do not want him showing up in 2 different GI offices for evaluation. Follow-up by: Richardson Dopp PA-C,  July 13, 2010 10:22 PM  Additional Follow-up for Phone Call Additional follow up Details #1::        PT HAVE A BALANCE WITH [facility] (ON CALL THESE MONTH) I SEND THE REFERRAL TO [facility] WAIT[facility] FOR APPT.Marland KitchenMaren Reamer  July 21, 2010 4:20 PM  Additional Follow-up by: Maren Reamer,  July 21, 2010 4:20 PM       Impression & Recommendations:  Problem # 1:  CHRONIC PANCREATITIS (ICD-577.1)  Orders: Gastroenterology Referral (GI)  Complete Medication List: 1)  Creon 12000 Unit Cpep (Pancrelipase (lip-prot-amyl)) .... Three times a day before meals 2)  Aspirin 81 Mg Tbec (Aspirin) .... Take one tablet by mouth daily 3)  Keppra 500 Mg Tabs (Levetiracetam) .... Take 1 tablet by mouth two times a day 4)  Ativan 1 Mg Tabs (Lorazepam) .... Take one by mouth no more than every 8 hours as needed for anxiety or agitation 5)  Pravastatin Sodium 20 Mg Tabs (Pravastatin sodium) .... Take 1 tab by mouth at bedtime for cholesterol 6)  Vitamin B-1 100 Mg Tabs (Thiamine hcl) .... Take 1 tablet by mouth once a day 7)  Flora-q Caps (Probiotic product) .... Take one daily 8)  Coreg 3.125 Mg Tabs (Carvedilol) .... Take one tab twice a day for blood pressure 9)  Carafate 1 Gm Tabs  (Sucralfate) .... Take one tab by mouth with meals as needed for stomach pain 10)  Robaxin 500 Mg Tabs (Methocarbamol) .... Take one by mouth every 6-8 hours as needed for spasms 11)  Nexium 40 Mg Cpdr (Esomeprazole magnesium) .... Take 1 capsule by mouth once a day for stomach 12)  Boost Liqd (Nutritional supplements) .... Drink one can three times a day 13)  Zofran 4 Mg Tabs (Ondansetron hcl) .... Take one by mouth two times a day as needed for nausea 14)  Folic Acid 1 Mg Tabs (Folic acid) .... Take 1 tablet by mouth once a day 15)  Multivitamins Tabs (Multiple vitamin) .... Take 1 tablet by mouth once a day - get this over the counter 16)  Nitrostat 0.4 Mg Subl (Nitroglycerin) .Marland Kitchen.. 1 sublingual as needed chest pain 17)  Vitamin B-12 1000 Mcg Tabs (Cyanocobalamin) .... Take 1 tablet by mouth once a day 18)  Oxycodone Hcl 10 Mg Tabs (Oxycodone hcl) .... Take one tab every 8 hours as needed for severe pain 19)  Nu-iron 150 Mg Caps (Polysaccharide iron complex) .... Take 1 capsule by mouth two times a day

## 2010-11-09 NOTE — Letter (Signed)
Summary: PT INFORMATION SHEET  PT INFORMATION SHEET   Imported By: Roland Earl 04/16/2010 14:50:28  _____________________________________________________________________  External Attachment:    Type:   Image     Comment:   External Document

## 2010-11-09 NOTE — Letter (Signed)
Summary: *HSN Results Follow up  [facility]  [address] [place], [place] [address]   Phone: [phone]  Fax: [phone]      08/11/2010   [address] [place], [place]  [address]   Dear  Mr. GREYSON SOBOTTA,                            ____S.Drinkard,FNP   ____D. Gore,FNP       ____B. McPherson,MD   ____V. Rankins,MD    ____E. Mulberry,MD    ____N. Hassell Done, FNP  ____D. Jobe Igo, MD    ____K. Tomma Lightning, MD    ____Other     This letter is to inform you that your recent test(s):  _______Pap Smear    _______Lab Test     _______X-ray    _______ is within acceptable limits  _______ requires a medication change  _______ requires a follow-up lab visit  _______ requires a follow-up visit with your provider   Comments:  We have been trying to reach you.  Please call the office at your earliest convenience.       _________________________________________________________ If you have any questions, please contact our office                     Sincerely,  [place] Shannon [facility]

## 2010-11-09 NOTE — Letter (Signed)
Summary: CARE MANAGEMENT  CARE MANAGEMENT   Imported By: Roland Earl 06/07/2010 16:28:12  _____________________________________________________________________  External Attachment:    Type:   Image     Comment:   External Document

## 2010-11-09 NOTE — Letter (Signed)
Summary: REQUESTING RECORDS FROM [facility]  REQUESTING RECORDS FROM [facility]   Imported By: Roland Earl 07/20/2010 15:51:53  _____________________________________________________________________  External Attachment:    Type:   Image     Comment:   External Document

## 2010-11-09 NOTE — Letter (Signed)
Summary: CASE MANAGEMENT  CASE MANAGEMENT   Imported By: Roland Earl 06/07/2010 15:07:41  _____________________________________________________________________  External Attachment:    Type:   Image     Comment:   External Document

## 2010-11-09 NOTE — Letter (Signed)
Summary: Discharge Summary  Discharge Summary   Imported By: Roland Earl 02/24/2010 12:11:07  _____________________________________________________________________  External Attachment:    Type:   Image     Comment:   External Document

## 2010-11-09 NOTE — Letter (Signed)
Summary: Life Vest  Life Vest   Imported By: Marilynne Drivers 07/22/2010 15:11:33  _____________________________________________________________________  External Attachment:    Type:   Image     Comment:   External Document

## 2010-11-09 NOTE — Assessment & Plan Note (Signed)
Summary: c/o abd. pain, Iron, TIBC, %sat, Ferritin, B12, RBC Folate, s...   Vital Signs:  Patient profile:   56 year old male Height:      68 inches Weight:      114 pounds BMI:     17.40 Temp:     98.3 degrees F oral Pulse rate:   111 / minute Pulse rhythm:   regular Resp:     18 per minute BP sitting:   107 / 78  (left arm) Cuff size:   regular  Vitals Entered By: [place] Shannon (March 18, 2010 11:58 AM) CC: pt is here for stomach pain.... pt says he doesnt eat well.... pt says he is out of pain med and would llike a refill... Is Patient Diabetic? No Pain Assessment Patient in pain? no       Does patient need assistance? Functional Status Self care Ambulation Normal   Primary Care Provider:  Richardson Dopp, PA-C  CC:  pt is here for stomach pain.... pt says he doesnt eat well.... pt says he is out of pain med and would llike a refill....  History of Present Illness:  55 year old male presents for followup on abdominal pain.  He called the office last week while I was out.  Dr. Amil Amen contacted the patient at home.  She asked him to refrain from using NSAIDs due to his cirrhosis.  He was set up for followup appointment today.  The patient notes periumbilical pain.  However this is fairly diffuse in this area.  His pain seems to be worse in the epigastric area.  It is clearly made worse by eating.  Not eating helps his pain improve.  He does feel nauseated.  He denies vomiting.  He denies fevers or chills.  His stools did not appear to be greasy or floating.  He is using a higher dose of the pancreatic enzymes.  He has had more chest pain with exertion.  Overall, there's been no significant change.  As noted previously I have tried to set him up with cardiology.  However, he has not been able to set up an appointment because he has not yet gone to eligibility.  He feels short of breath with this chest pain and some radiation up into his jaw.  I have not placed him on aspirin due to  cirrhosis.  He has not yet obtained his echocardiogram.  Current Medications (verified): 1)  Omeprazole 20 Mg Cpdr (Omeprazole) .... 2 Caps By Mouth Once Daily 2)  Oxycodone Hcl 5 Mg Tabs (Oxycodone Hcl) .Marland Kitchen.. 1 Tab, Every 6 Hours As Needed 3)  Zenpep 10000 Unit Cpep (Pancrelipase (Lip-Prot-Amyl)) .... Take 2 Capsules By Mouth Three Times A Day Before Meals  Allergies (verified): 1)  ! Lisinopril  Physical Exam  General:  alert, well-developed, and well-nourished.   Head:  normocephalic and atraumatic.   Neck:  supple.   Lungs:  normal breath sounds.   Heart:  normal rate and regular rhythm.   Abdomen:  + voluntary guarding soft, normal bowel sounds, no distention, no masses, and no hepatomegaly.   Msk:  normal ROM.   Neurologic:  alert & oriented X3 and cranial nerves II-XII intact.   Psych:  normally interactive.     Impression & Recommendations:  Problem # 1:  ANEMIA (ICD-285.9)  check labs today  Orders: T-Iron [id number]) T-Iron Binding Capacity (TIBC) ([id number]) T-Ferritin (A999333) T-Folic Acid; RBC ([id number]) T-Vitamin B12 [id number]) T-Hemoccult Card-Multiple (take home) (82270)  Problem # 2:  CHRONIC PANCREATITIS (ICD-577.1)  suspect abdominal pain all related to this d/w Dr. Amil Amen could consider repeat CT will adjust pancreatic enz's first refill pain meds would do better without narcotics, but with cirrhosis cannot use nsaids check cmet with labs today + lipase f/u in 2 weeks  Orders: T-Comprehensive Metabolic Panel ([id number]) T-Lipase [id number])  Problem # 3:  CHEST PAIN UNSPECIFIED (ICD-786.50) will give him NTG to have on hand encouraged him to go to eligibility appt so he can get in to see card no change in chest pain defer asa at this time with cirrhosis advised him to go to ED if increase, changed pain  Complete Medication List: 1)  Omeprazole 20 Mg Cpdr (Omeprazole) .... 2 caps by mouth once daily 2)  Oxycodone Hcl  5 Mg Tabs (Oxycodone hcl) .Marland Kitchen.. 1 tab, every 6 hours as needed 3)  Zenpep 10000 Unit Cpep (Pancrelipase (lip-prot-amyl)) .... Take 3 capsules by mouth three times a day before meals 4)  Nitrostat 0.4 Mg Subl (Nitroglycerin) .Marland Kitchen.. 1 pill under tongue as needed for chest pain; may repeat x 2; call 911 before 3rd tab  Patient Instructions: 1)  Try to eat clear liquid diet/bland foods for 2-3 days.  You should drink 6-8 glasses of water.  You can eat chicken broth or chicken noodle soup.  Add bland foods like bread, applesauce, bananas, etc. In the next 1-2 days.  Try to avoid greasy or fatty foods.   2)  Please schedule a follow-up appointment in 2 weeks with Rueben Kassim.  3)  Go to the ED if your chest pain gets worse.   4)  Go to the ED if your stomach pain gets worse. Prescriptions: NITROSTAT 0.4 MG SUBL (NITROGLYCERIN) 1 pill under tongue as needed for chest pain; may repeat x 2; call 911 before 3rd tab  #25 x 1   Entered and Authorized by:   Richardson Dopp PA-C   Signed by:   Richardson Dopp PA-C on 03/18/2010   Method used:   Print then Give to Patient   RxID:   [id number] ZENPEP 10000 UNIT CPEP (PANCRELIPASE (LIP-PROT-AMYL)) Take 3 capsules by mouth three times a day before meals  #270 x 5   Entered and Authorized by:   Richardson Dopp PA-C   Signed by:   Richardson Dopp PA-C on 03/18/2010   Method used:   Print then Give to Patient   RxID:   [id number] OXYCODONE HCL 5 MG TABS (OXYCODONE HCL) 1 tab, every 6 hours as needed  #45 x 0   Entered and Authorized by:   Richardson Dopp PA-C   Signed by:   Richardson Dopp PA-C on 03/18/2010   Method used:   Print then Give to Patient   RxID:   [id number]

## 2010-11-09 NOTE — Progress Notes (Signed)
Summary: Cardio referral update  Phone Note From Other Clinic   Caller: Referral Coordinator Summary of Call: Just wanted to give u a heads up .Steven Frey was sched for eligil today as a work in double book & Steven Frey called and R/S. His appt is not until July, so I can move forward with his referral if u want me too but  he will not get a discount. What would you like for me to do?  Initial call taken by: Kathrynn Ducking,  Feb 26, 2010 2:31 PM  Follow-up for Phone Call        [place], Please see what he plans to do. I am concerned about his symptoms. We had him scheduled to come in to get his eligibility so we could get him in to see the cardiologist.  Follow-up by: Versie Starks,  Feb 26, 2010 3:03 PM  Additional Follow-up for Phone Call Additional follow up Details #1::        spoke with pt and he stated that he did not have all his papers he needed for eligibility so he r/s... pt states as soon as he gets eligible he will call us Additional Follow-up by: [place] Shannon,  Mar 03, 2010 11:08 AM    Additional Follow-up for Phone Call Additional follow up Details #2::    But that's not until July.He can try to go to DSS to get the orange card. Follow-up by: Kathrynn Ducking,  Mar 03, 2010 12:50 PM  Additional Follow-up for Phone Call Additional follow up Details #3:: Details for Additional Follow-up Action Taken: Spoke with pt. and advised to go to DSS to facilitate orange card process.  Verbalized understanding. Sherian Maroon RN  Mar 05, 2010 9:12 AM

## 2010-11-09 NOTE — Progress Notes (Signed)
Summary: Pancreatitis F/u  Phone Note Outgoing Call   Summary of Call: Called patient today.  He notes that his abdominal pain is much better.  Had some pain on Sat., but overall feeling better. Lipase high last week. Will recheck this week.  Please bring patient in on 03/25/2010 for Repeat Lipase and LFTs.  Initial call taken by: Versie Starks,  March 22, 2010 8:55 AM  Follow-up for Phone Call        Labs scheduled for 03/25/10 9:10 am. Left message on answer machine for pt. to retun call Bridgett Larsson RN 06/13//1 12:42 pm  Spoke with pt. and advised of lab appt. Sherian Maroon RN  March 22, 2010 5:11 PM  Follow-up by: Sherian Maroon RN,  March 22, 2010 5:11 PM

## 2010-11-09 NOTE — Miscellaneous (Signed)
Summary: [facility]//FAXED  [facility]FAXED   Imported By: Roland Earl 08/03/2010 11:47:13  _____________________________________________________________________  External Attachment:    Type:   Image     Comment:   External Document

## 2010-11-09 NOTE — Progress Notes (Signed)
  DDS request recieved sent to [facility]  July 19, 2010 10:56 AM     Appended Document:  DDS request received sent to [facility]

## 2010-11-09 NOTE — Letter (Signed)
Summary: [place] [facility]  [facility]   Imported By: Roland Earl 05/24/2010 12:43:50  _____________________________________________________________________  External Attachment:    Type:   Image     Comment:   External Document

## 2010-11-09 NOTE — Progress Notes (Signed)
Summary: PATIENT NEEDS TO SEE GROUP PRACTICE  Phone Note Other Incoming   Summary of Call: [facility]. DR MADOFFS NURSE CALLED AND SAYS THAT DR MADOFF SAYS Steven Frey NEEDS TO BE IN A GROUP PRACTICE WITH A DR THAT CAN ADMIT TO THE HOSPITAL, AND HE HAS A LOT OF THINGS GOING ON. Initial call taken by: Steven Frey,  August 04, 2010 4:28 PM  Follow-up for Phone Call        [place]. He is welcome to find that practice and transfer his care if that what he desires to do. i offer no recommendations on what practice he can transfer to. He will however need to find one that accepts medicaid Follow-up by: Aurora Mask FNP,  August 04, 2010 5:56 PM  Additional Follow-up for Phone Call Additional follow up Details #1::        called pt... he is in hospital....Steven KitchenMarland KitchenThailand Frey  August 05, 2010 3:26 PM   pt is still in hospital...what shall i do Additional Follow-up by: Steven Frey,  August 10, 2010 4:42 PM    Additional Follow-up for Phone Call Additional follow up Details #2::    send letter advising pt to call this office after he gets out of the hospital  Do not include details of my note in the letter   **If and when he calls office back make him aware of my previous note and have discussion with him at that time** Follow-up by: Aurora Mask FNP,  August 11, 2010 8:36 AM  Additional Follow-up for Phone Call Additional follow up Details #3:: Details for Additional Follow-up Action Taken: ok... letter done.... Steven Frey  August 11, 2010 9:46 AM

## 2010-11-09 NOTE — Consult Note (Signed)
[facility]   Imported By: Sallee Provencal 04/21/2010 16:19:14  _____________________________________________________________________  External Attachment:    Type:   Image     Comment:   External Document

## 2010-11-09 NOTE — Progress Notes (Signed)
Summary: Card Referral  Phone Note Outgoing Call   Summary of Call: Needs referral to cardiology for chest pain and shortness of breath. I would like to get him in to be seen in the next 2-3 weeks.  Initial call taken by: Versie Starks,  Feb 23, 2010 3:29 PM  Follow-up for Phone Call        Pt has an eligi appt on 02-26-10.Will see if he gets his card before I send referral Follow-up by: Kathrynn Ducking,  Feb 24, 2010 3:33 PM  Additional Follow-up for Phone Call Additional follow up Details #1::        Ok.  Please try to get him in as soon as he has his card.  I am a little worried about his symptoms. Thanks. Additional Follow-up by: Richardson Dopp PA-C,  Feb 24, 2010 5:38 PM

## 2010-11-09 NOTE — Letter (Signed)
Summary: [place]   Imported By: Roland Earl 07/19/2010 16:42:22  _____________________________________________________________________  External Attachment:    Type:   Image     Comment:   External Document

## 2010-11-09 NOTE — Assessment & Plan Note (Signed)
Summary: XFU--PANCRETITUS //YC   Vital Signs:  Patient profile:   55 year old male Height:      68 inches Weight:      120 pounds BMI:     18.31 Temp:     97.8 degrees F oral Pulse rate:   108 / minute Pulse rhythm:   regular Resp:     17 per minute BP sitting:   134 / 83  (left arm) Cuff size:   regular  Vitals Entered By: [place] Shannon (Feb 23, 2010 2:23 PM) CC: follow-up visit,pancretitus, shortness of breath, pain in chest slight -off/on Is Patient Diabetic? No Pain Assessment Patient in pain? no       Does patient need assistance? Functional Status Self care Ambulation Normal 9  Primary Care Provider:  Richardson Dopp, PA-C  CC:  follow-up visit, pancretitus, shortness of breath, and pain in chest slight -off/on.  History of Present Illness: New patient.  XFU.  Just d/c from [facility] for recurrent pancreatitis.  The patient states that he originally presented to the hospital with chest pain and shortness of breath.  He had abdominal pain associated with this.  He had negative cardiac enzymes.  His lipase was elevated.  He was diagnosed with acute pancreatitis.  His abdominal CT did show some cirrhosis.  He had loculated fluid collections around his pancreas.  He complained of pressure in his abdomen.  One of these was drained.  This relieved his pressure.  He was treated with nothing by mouth for several days.  His pain improved.  He was discharged home on pancreatic enzymes.    He returns today for followup.  He notes that he is doing somewhat better.  He has had a couple episodes of abdominal pain.  He did have to take oxycodone for it.  He does complain of some chest discomfort and shortness of breath.  This seems to have started in the last several weeks.  He has mowed his lawn a couple times since being home from the hospital.  He uses a Chiropractor.  He said to stop on occasion with shortness of breath.  He describes New York Heart Association class II dyspnea.  He denies  insomnia or PND.  He denies lower extremity edema.  He denies any syncope.  He has had some chest pressure when he gets short of breath with exertion.  Of note, he did describe some jaw and arm pain in his left arm at the time of his original presentation to the hospital.  Of note, he continues to have some dyspepsia.  He feels like the current dose of omeprazole is not enough.   Habits & Providers  Alcohol-Tobacco-Diet     Tobacco Status: current  Exercise-Depression-Behavior     Drug Use: no  Current Medications (verified): 1)  Aspirin Adult Low Strength 81 Mg Tbec (Aspirin) .... Take One Tablet By Mouth Once Daily 2)  Hydrochlorothiazide 25 Mg Tabs (Hydrochlorothiazide) .... Take One Tablet By Mouth Daily. 3)  Cvs Vitamin B-1 100 Mg Tabs (Thiamine Hcl) .... Take One Tablet By Mouth Once Daily 4)  Omeprazole 20 Mg Cpdr (Omeprazole) .Marland Kitchen.. 1 Cap. Every Day 5)  Oxycodone Hcl 5 Mg Tabs (Oxycodone Hcl) .Marland Kitchen.. 1 Tab, Every 6 Hours As Needed 6)  Creon 6000 Unit Cpep (Pancrelipase (Lip-Prot-Amyl))  Allergies (verified): 1)  ! Lisinopril  Past History:  Past Medical History: 1. Angioedema rx requiring intubation/vent support suspected secondary to ACE I (but also on zithromax) 5/09 hospitalization 2.  Smoker 3. Etoh abuse (sober since Dec 2010) 4. Chronic Pancreatitis (10/08 hosp)     a.  admx 02/2010 . . . pseudocyst aspirated during admxn 5. Cirrhosis 6. Gastritis (alcohol induced) 7. Depression . . . no hx of meds 8. Borderline elevation in BP in past  Past Surgical History: right inguinal hernia repair right wrist ORIF for fx  Family History: Mother deceased at ge 35 Father living at 72 with hypertension sister living at age 89 with hypertension brother living at age 74 with no significant medical history No CAD in family No colon cancer  Social History: h/o alcohol abuse (states quit in 09/2009)   a.  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 Works in Clinical cytogeneticist. .  . laid off in  06/2008 Patient is divorced with 2 children Current Smoker (1/3-1 ppd for 30+ years) Drug use-no Drug Use:  no  Review of Systems      See HPI General:  Denies fever. GI:  Denies bloody stools and dark tarry stools.  Physical Exam  General:  alert, well-developed, and well-nourished.   Head:  normocephalic and atraumatic.   Eyes:  pupils equal, pupils round, and pupils reactive to light.   Mouth:  pharynx pink and moist.   Neck:  supple.   Lungs:  normal breath sounds, no crackles, and no wheezes.   Heart:  normal rate and regular rhythm.   Abdomen:  soft and no hepatomegaly.   Neurologic:  alert & oriented X3 and cranial nerves II-XII intact.   Psych:  normally interactive.     Impression & Recommendations:  Problem # 1:  CHRONIC PANCREATITIS (ICD-577.1) suspect some of his pain related to this will increase his pancrelipase increase omeprazole as well  Problem # 2:  CHEST PAIN UNSPECIFIED (ICD-786.50)  he has some pain and dyspnea at times with exertion some of his symptoms seem to be related to his pancreatitis as noted above however, I am concerned about some of his symptoms as he has had some jaw and arm pain assoc at times will refer to card get Echo with ? inf q's on ECG  Orders: Cardiology Referral (Cardiology) EKG w/ Interpretation (93000) 2 D Echo (2 D Echo)  Problem # 3:  ALCOHOL ABUSE (ICD-305.00)  open to seeing substance abuse counselor  Orders: Misc. Referral (Misc. Ref)  Problem # 4:  HYPERTENSION, BENIGN (ICD-401.1) controlled without meds  The following medications were removed from the medication list:    Hydrochlorothiazide 25 Mg Tabs (Hydrochlorothiazide) .Marland Kitchen... Take one tablet by mouth daily.  Problem # 5:  Preventive Health Care (ICD-V70.0) eventually schedule CPE get him to come back fasting for labs  Complete Medication List: 1)  Omeprazole 20 Mg Cpdr (Omeprazole) .... 2 caps by mouth once daily 2)  Oxycodone Hcl 5 Mg Tabs  (Oxycodone hcl) .Marland Kitchen.. 1 tab, every 6 hours as needed 3)  Zenpep 10000 Unit Cpep (Pancrelipase (lip-prot-amyl)) .... Take 2 capsules by mouth three times a day before meals  Patient Instructions: 1)  Return fasting in the next 1-2 weeks for labs (nothing to eat or drink after midnight except water): 2)  CMET, CBC, Lipids, TSH, UDS (Dx V70.0, 577.1, 786.50) 3)  Please schedule a follow-up appointment in 1 month with Dalyn Becker. 4)  Someone will call you about the referral to cardiology. 5)  Try to take Tylenol as needed for pain.  Do not take more that 1000 mg every 6 hours. 6)  You can try to take Ibuprofen as well.  Do not take more than 400 mg once a day and do not take more than 1-2 days a week. Prescriptions: OMEPRAZOLE 20 MG CPDR (OMEPRAZOLE) 2 caps by mouth once daily  #60 x 5   Entered and Authorized by:   Richardson Dopp PA-C   Signed by:   Richardson Dopp PA-C on 02/23/2010   Method used:   Print then Give to Patient   RxID:   [id number] ZENPEP 10000 UNIT CPEP (PANCRELIPASE (LIP-PROT-AMYL)) Take 2 capsules by mouth three times a day before meals  #180 x 5   Entered and Authorized by:   Richardson Dopp PA-C   Signed by:   Richardson Dopp PA-C on 02/23/2010   Method used:   Print then Give to Patient   RxID:   [id number]    EKG  Procedure date:  02/23/2010  Findings:      Normal sinus rhythm with rate of:  94 normal axis ? Q waves nonspecific ST - TW changes

## 2010-11-09 NOTE — Progress Notes (Signed)
Summary: [facility] PR[facility]  Phone Note Call from Patient Call back at Home Phone [phone]   Caller: Steven Frey Summary of Call: [facility].  [name] CALLED BECAUSE Steven Frey CANNOT GET HIS CRESTOR OR OXYCODONE. SHE SAYS THAT [facility] HAS BEEN TRYING TO GET IN TOUCH WHITH [place] TO GET PRIOR AUTHORIZATION. Initial call taken by: Steven Frey,  July 12, 2010 4:25 PM  Follow-up for Phone Call        Spoke with pharmacy-- they were able to get the oxycodone to go through, but not the Crestor.  Left message with male for pt.'s sister to return call.  Sherian Maroon RN  July 13, 2010 12:38 PM  Medication changed to pravastatin.  No need for PA.  Pt.'s sister made aware. Sherian Maroon RN  July 13, 2010 2:13 PM

## 2010-11-09 NOTE — Progress Notes (Signed)
Summary: Cardio referral update  Phone Note From Other Clinic   Caller: Referral Coordinator Summary of Call: Pt still has not completed his  eligilbility, so I still have not made the cardio appt. Initial call taken by: Kathrynn Ducking,  March 17, 2010 10:05 AM  Follow-up for Phone Call        [place], Please f/u with him to find out where he is at with getting eligibility. Follow-up by: Richardson Dopp PA-C,  March 17, 2010 1:26 PM  Additional Follow-up for Phone Call Additional follow up Details #1::        pt has appt on july 19... Additional Follow-up by: [place] Shannon,  March 17, 2010 2:24 PM    Additional Follow-up for Phone Call Additional follow up Details #2::    Hilda Blades, Please arrange the card appt after 7/19. Thanks  Follow-up by: Richardson Dopp PA-C,  March 17, 2010 4:31 PM  Additional Follow-up for Phone Call Additional follow up Details #3:: Details for Additional Follow-up Action Taken: [facility] Additional Follow-up by: Kathrynn Ducking,  March 22, 2010 8:25 AM

## 2010-11-09 NOTE — Consult Note (Signed)
Summary: [facility]  [place]   Imported By: Marilynne Drivers 05/04/2010 16:21:19  _____________________________________________________________________  External Attachment:    Type:   Image     Comment:   External Document

## 2010-11-09 NOTE — Letter (Signed)
Summary: Lipid Letter  [facility]  [address] [place], [place] [address]   Phone: [phone]  Fax: [phone]    03/15/2010  Steven Frey [address] [place], [place]  [address]  Dear Steven Frey:  Here are your cholesterol results:    Cholesterol:       138     Goal: < 200   HDL "good" Cholesterol:   19     Goal: > 40   LDL "bad" Cholesterol:   103     Goal: < 100   Triglycerides:       80     Goal: < 150    You are not at goal with your LDL and HDL.  Please follow the recommendations below for now.  Schedule repeat labs in 3 months.  I may need to put you on medicine if the numbers do not improve.    TLC Diet (Therapeutic Lifestyle Change): Saturated Fats & Transfatty acids should be kept < 7% of total calories ***Reduce Saturated Fats Polyunstaurated Fat can be up to 10% of total calories Monounsaturated Fat Fat can be up to 20% of total calories Total Fat should be no greater than 25-35% of total calories Carbohydrates should be 50-60% of total calories Protein should be approximately 15% of total calories Fiber should be at least 20-30 grams a day ***Increased fiber may help lower LDL Total Cholesterol should be < 200mg /day Consider adding plant stanol/sterols to diet (example: Benacol spread) ***A higher intake of unsaturated fat may reduce Triglycerides and Increase HDL    Adjunctive Measures (may lower LIPIDS and reduce risk of Heart Attack) include: Aerobic Exercise (20-30 minutes 3-4 times a week) Limit Alcohol Consumption Weight Reduction Dietary Fiber 20-30 grams a day by mouth    Current Medications: 1)    Omeprazole 20 Mg Cpdr (Omeprazole) .... 2 caps by mouth once daily 2)    Oxycodone Hcl 5 Mg Tabs (Oxycodone hcl) .Marland Kitchen.. 1 tab, every 6 hours as needed 3)    Zenpep 10000 Unit Cpep (Pancrelipase (lip-prot-amyl)) .... Take 2 capsules by mouth three times a day before meals  If you have any questions, please call.  Sincerely,     Richardson Dopp PA-C

## 2010-11-09 NOTE — Discharge Summary (Signed)
Summary: Hospital Discharge Update    Hospital Discharge Update:  Date of Admission: 07/29/2010 Date of Discharge: 08/14/2010  Brief Summary:  55 yo man with a complex past medical history presents with altered mental status secondary to acute on chronic pancreatitis.  Patient had a waxing and waning hospital course of abdominal tenderness so GI was consulted.  CT scan on 07/29/10 showed progressive changes of chronic pancreatitis as described.  Acute  exacerbation not excluded and a new left lobe liver lesion could represent an abscess.  A repeat CT scan 08/05/10 showed atrophic pancreas, ductal dilatation, ascites, small bilateral effusion, mild cirrhosis, and left renal cyst most likely complex cyst.   MRI/MRCP 08/09/10 scan showed pseudocyst, pancreas divisum, possible subacute portal vein thrombosis.  The questionable liver abscess was not seen on MRI.   He was placed on bowel rest, pain controlled, and pancreatic enzymes.  He slowly improved over the course of 2 weeks and was tolerating low fat diet prior to discharge. In addition, he also had several episodes of asymptomatic bradycardia and 6-7 beats V-tach during this admission.  Patient refused to wear his Lifevest because he states that it makes him feel uncomfortable.   Labs needed at follow-up: CBC with differential, Basic metabolic panel  Other follow-up issues:  Follow up CT scan of abdomen in 6-8 weeks  Problem list changes:  Added new problem of ABSCESS OF LIVER (ICD-572.0)  Medication list changes:  Removed medication of COREG 3.125 MG TABS (CARVEDILOL) take one tab twice a day for blood pressure Added new medication of OXYCODONE HCL 10 MG TABS (OXYCODONE HCL) take 1 tablet every 6-8 hr as needed for pain - Signed Rx of OXYCODONE HCL 10 MG TABS (OXYCODONE HCL) take 1 tablet every 6-8 hr as needed for pain;  #25 x 0;  Signed;  Entered by: Marcelle Smiling MD;  Authorized by: Marcelle Smiling MD;  Method used: Handwritten  The  medication, problem, and allergy lists have been updated.  Please see the dictated discharge summary for details.  Discharge medications:  CREON 12000 UNIT CPEP (PANCRELIPASE (LIP-PROT-AMYL)) three times a day before meals ASPIRIN 81 MG TBEC (ASPIRIN) Take one tablet by mouth daily KEPPRA 500 MG TABS (LEVETIRACETAM) Take 1 tablet by mouth two times a day ATIVAN 1 MG TABS (LORAZEPAM) Take one by mouth no more than every 8 hours as needed for anxiety or agitation PRAVASTATIN SODIUM 20 MG TABS (PRAVASTATIN SODIUM) Take 1 tab by mouth at bedtime for cholesterol VITAMIN B-1 100 MG TABS (THIAMINE HCL) Take 1 tablet by mouth once a day FLORA-Q  CAPS (PROBIOTIC PRODUCT) take one daily CARAFATE 1 GM TABS (SUCRALFATE) Take one tab by mouth with meals as needed for stomach pain ROBAXIN 500 MG TABS (METHOCARBAMOL) Take one by mouth every 6-8 hours as needed for spasms NEXIUM 40 MG CPDR (ESOMEPRAZOLE MAGNESIUM) Take 1 capsule by mouth once a day for stomach BOOST  LIQD (NUTRITIONAL SUPPLEMENTS) Drink one can three times a day ZOFRAN 4 MG TABS (ONDANSETRON HCL) Take one by mouth two times a day as needed for nausea FOLIC ACID 1 MG TABS (FOLIC ACID) Take 1 tablet by mouth once a day MULTIVITAMINS  TABS (MULTIPLE VITAMIN) Take 1 tablet by mouth once a day - get this over the counter NITROSTAT 0.4 MG SUBL (NITROGLYCERIN) 1 sublingual as needed chest pain VITAMIN B-12 1000 MCG TABS (CYANOCOBALAMIN) Take 1 tablet by mouth once a day OXYCODONE HCL 10 MG TABS (OXYCODONE HCL) Take one tab every 8 hours  as needed for severe pain NU-IRON 150 MG CAPS (POLYSACCHARIDE IRON COMPLEX) Take 1 capsule by mouth two times a day OXYCODONE HCL 10 MG TABS (OXYCODONE HCL) take 1 tablet every 6-8 hr as needed for pain  Other patient instructions:  You have a follow-up appointment with Dr. Amil Amen on Aug 20, 2010 at 3:30 at [facility] will need to continue to take Creon 12000 units, three times a day  Take pain medication  (Oxycodone 10mg ) 1 tablet every 6-8 hours as needed for pain Please eat LOW FAT DIET as instructed by dietician Please abstain from alcohol/ NO alcohol You will need to get a follow up CT scan in 6-8 weeks You need to follow up with Dr. Oletta Lamas in 1 month, his office will call for you for an appointment, if you do not hear from his office, please call [phone] Please stop taking your Coreg until you follow up with your primary care physician because your blood pressure has been on the soft side.   Please follow up with Dr. Joylene Grapes, Cardiology  Note: Hospital Discharge Medications & Other Instructions handout was printed, one copy for patient and a second copy to be placed in hospital chart.

## 2010-11-09 NOTE — Consult Note (Signed)
[facility]   Imported By: Sallee Provencal 04/26/2010 10:53:24  _____________________________________________________________________  External Attachment:    Type:   Image     Comment:   External Document

## 2010-11-09 NOTE — Letter (Signed)
Summary: AGREEMENT FOR CONREOLLED SUBSTANCE  AGREEMENT FOR [facility] SUBSTANCE   Imported By: Roland Earl 07/07/2010 15:53:09  _____________________________________________________________________  External Attachment:    Type:   Image     Comment:   External Document

## 2010-11-09 NOTE — Letter (Signed)
Summary: [facility]   Imported By: Marilynne Drivers 06/03/2010 16:14:44  _____________________________________________________________________  External Attachment:    Type:   Image     Comment:   External Document

## 2010-11-09 NOTE — Letter (Signed)
Summary: [facility] - Admission Record  [facility] - Admission Record   Imported By: Marilynne Drivers 06/04/2010 09:12:21  _____________________________________________________________________  External Attachment:    Type:   Image     Comment:   External Document

## 2010-11-09 NOTE — Letter (Signed)
Summary: Lipid Letter  [facility]  [address] [place], [place] [address]   Phone: [phone]  Fax: [phone]    03/15/2010  Steven Frey [address] [place], [place]  [address]  Dear Gwyndolyn Saxon:  We have carefully reviewed your last lipid profile from 03/09/2010 and the results are noted below with a summary of recommendations for lipid management.    Cholesterol:       138     Goal: <200   HDL "good" Cholesterol:   19     Goal: >40   LDL "bad" Cholesterol:   103     Goal: <100   Triglycerides:       80     Goal: <150        TLC Diet (Therapeutic Lifestyle Change): Saturated Fats & Transfatty acids should be kept < 7% of total calories ***Reduce Saturated Fats Polyunstaurated Fat can be up to 10% of total calories Monounsaturated Fat Fat can be up to 20% of total calories Total Fat should be no greater than 25-35% of total calories Carbohydrates should be 50-60% of total calories Protein should be approximately 15% of total calories Fiber should be at least 20-30 grams a day ***Increased fiber may help lower LDL Total Cholesterol should be < 200mg /day Consider adding plant stanol/sterols to diet (example: Benacol spread) ***A higher intake of unsaturated fat may reduce Triglycerides and Increase HDL    Adjunctive Measures (may lower LIPIDS and reduce risk of Heart Attack) include: Aerobic Exercise (20-30 minutes 3-4 times a week) Limit Alcohol Consumption Weight Reduction Aspirin 75-81 mg a day by mouth (if not allergic or contraindicated) Dietary Fiber 20-30 grams a day by mouth    Current Medications: 1)    Omeprazole 20 Mg Cpdr (Omeprazole) .... 2 caps by mouth once daily 2)    Oxycodone Hcl 5 Mg Tabs (Oxycodone hcl) .Marland Kitchen.. 1 tab, every 6 hours as needed 3)    Zenpep 10000 Unit Cpep (Pancrelipase (lip-prot-amyl)) .... Take 2 capsules by mouth three times a day before meals  If you have any questions, please call. We appreciate being able to  work with you.   Sincerely,   [facility] Richardson Dopp PA-C

## 2010-11-09 NOTE — Progress Notes (Signed)
Summary: c/o stomach pain not relieved OTC meds  Phone Note Call from Patient   Caller: Patient Summary of Call: Pt. c/o persistent stomach pain, Tylenol and ibuprofen not adequate for pain relief.  Advised to try Aleve as alternate pain reliever and to call back if symptoms increase in severity. Sherian Maroon RN  Mar 05, 2010 9:18 AM      Appended Document: c/o stomach pain not relieved OTC meds Please make sure he takes with food, not on an empty stomach.  To stop and call if develops blood in stool or black sticky stool    Appended Document: Orders Update Spoke with pt. and advised of recommendations for medication usage; states he takes meds with food, instructed as to adverse effects -- verbalized understanding. Sherian Maroon RN  Mar 09, 2010 12:35 PM  After further review of chart, realized that pt. has cirrhosis as well as pancreatitis.  He should not be taking any NSAIDs as he probably already does not clot well.  Called pt. and discussed pain--it's actually at the umbilical level, which would not associate with pancreatitis.  Has had before (before a recent hospitalization)  States it feels like he has been kicked in the balls 2 times really hard.  He will call in tomorrow to make an appt. to be evaluated.  If he does not, please call him and get that set up next week with Nadira Single.  If unable to get in with [place], can schedule with me  Has appt. 03/26/10. Sherian Maroon RN  March 11, 2010 9:22 AM     Clinical Lists Changes       Appended Document: c/o stomach pain not relieved OTC meds Spoke with pt. - rescheduled appt. to 03/18/10. Sherian Maroon RN  March 11, 2010 9:39 AM

## 2010-11-09 NOTE — Letter (Signed)
Summary: MAILED REQUESTED RECORDS TO [facility]  MAILED REQUESTED RECORDS TO [facility]   Imported By: Roland Earl 07/26/2010 12:59:49  _____________________________________________________________________  External Attachment:    Type:   Image     Comment:   External Document

## 2010-11-09 NOTE — Assessment & Plan Note (Signed)
Summary: eph/jml   Visit Type:  Follow-up Primary Provider:  Richardson Dopp, PA-C   History of Present Illness: Mr Regen presents today for further EP evaluation.  He previously had out of hospital witnessed VF arrest 6/11 for which he was treated at [facility].  He had QT prolongation during cooling and required prolonged hospitalization due to respiratory failure.  He also has a h/o heavy ETOH with cirrhosis and chronic pancreatitis for which he has had several subsequent ER presentations since his hospital discharge in July 2011.  He continues to lose weight and has failure to thrive.  He has not been felt to be well enough for ICD implantation previously and has had a LifeVest for which he has been noncompliant with wearing.  He denies wearing his vest over the past week. During a recent hospitalization, he was evaluated by Dr Verl Blalock who recommended to have the patient's vest removed and palliative care for arrhythmias longterm.  He decided to keep the vest until follow-up in our office today.   The patient denies symptoms of palpitations, chest pain, shortness of breath, orthopnea, PND, lower extremity edema, dizziness, presyncope, syncope, or neurologic sequela.  His primary concern is poor appetite and ongoing weight loss.  Current Medications (verified): 1)  Protonix 40 Mg Tbec (Pantoprazole Sodium) .... Two Times A Day 2)  Oxycodone Hcl 5 Mg Tabs (Oxycodone Hcl) .Marland Kitchen.. 1 Tab, Every 6 Hours As Needed 3)  Probiotic Formula  Caps (Probiotic Product) .... Daily 4)  Nitrostat 0.4 Mg Subl (Nitroglycerin) .Marland Kitchen.. 1 Pill Under Tongue As Needed For Chest Pain; May Repeat X 2; Call 911 Before 3rd Tab 5)  Zofran 4 Mg Tabs (Ondansetron Hcl) .... As Needed 6)  Creon 12000 Unit Cpep (Pancrelipase (Lip-Prot-Amyl)) .... Uad 7)  Aspirin 81 Mg Tbec (Aspirin) .... Take One Tablet By Mouth Daily 8)  Carvedilol 3.125 Mg Tabs (Carvedilol) .... Take One Tablet By Mouth Twice A Day 9)  Keppra 500 Mg Tabs  (Levetiracetam) .... Two Times A Day 10)  Ativan .... As Needed 11)  Crestor 40 Mg Tabs (Rosuvastatin Calcium) .... Take One Tablet By Mouth Daily. 12)  Vitamin B-1 100 Mg Tabs (Thiamine Hcl) .... Once Daily 13)  Prostate  Tabs (Specialty Vitamins Products) .... Once Daily  Allergies (verified): 1)  ! Lisinopril  Past History:  Past Medical History: 1. Angioedema rx requiring intubation/vent support suspected secondary to ACE I (but also on zithromax) 5/09 hospitalization 2. Smoker 3. Etoh abuse 4. Chronic Pancreatitis (10/08 hosp)     a.  admx 02/2010 . . . 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 Gastritis (alcohol induced) 7. Depression . . . no hx of meds 8. Borderline elevation in BP in past 9.VF arrest 6/11 successfully rescucitated with prolonged hospitalization due to respiratory failure QT prolongation during cooling, now resolved  Past Surgical History: Reviewed history from 02/23/2010 and no changes required. right inguinal hernia repair right wrist ORIF for fx  Social History: Reviewed history from 02/23/2010 and no changes required. h/o alcohol abuse, last used prior to hospitalization for VF arrest 6/11. He has been in a nursing home since that time.   a.  never been in [place], etc. Works in Clinical cytogeneticist. .  . laid off in 06/2008 Patient is divorced with 2 children Current Smoker (1/3-1 ppd for 30+ years) Drug use-no  Review of Systems       All systems are reviewed and negative except as listed in the HPI.  Vital Signs:  Patient profile:   55 year old male Height:      68 inches Weight:      108 pounds BMI:     16.48 Pulse rate:   83 / minute BP sitting:   114 / 70  (left arm)  Vitals Entered By: Margaretmary Bayley CMA (May 24, 2010 9:02 AM)  Physical Exam  General:  cachexic,  chronically ill Head:  normocephalic and atraumatic Eyes:  PERRLA/EOM intact; conjunctiva and lids normal. Mouth:  Teeth, gums and palate normal. Oral mucosa  normal. Neck:  supple, no TM or JVD Lungs:  Clear bilaterally to auscultation and percussion. Heart:  Non-displaced PMI, chest non-tender; regular rate and rhythm, S1, S2 without murmurs, rubs or gallops. Carotid upstroke normal, no bruit. Normal abdominal aortic size, no bruits. Femorals normal pulses, no bruits. Pedals normal pulses. No edema, no varicosities. Abdomen:  thin,  soft, NT +BS Msk:  diffuse muscle atrophy residual hemiparesis/weakness s/p prior stroke Pulses:  pulses normal in all 4 extremities Extremities:  No clubbing or cyanosis.  Neurologic:  Alert and oriented x 3.  Very poor recent memory recall but answers questions appropriately Skin:  Intact without lesions or rashes. Cervical Nodes:  no significant adenopathy Psych:  flat affect.   EKG  Procedure date:  05/24/2010  Findings:      sinus rhythm 83 bpm, PR 154, Qtc 448, otherwise normal ekg  Impression & Recommendations:  Problem # 1:  CARDIAC ARREST (ICD-427.5) The patient presents for follow-up after a prior witnessed VF arrest.  He had QT prolongation during cooling but no other causes for his arrest.  He is very ill with ETOH pancreatitis/ cirrhosis and prior CVA and also has residual encephalopathy.   He remains in a nursing home and it is not clear that he will not return to ETOH upon returning home in the near future.  He is presently too sick for ICD implantation (mortality limited by comorbidities) and therefore I have recommended lifevest therapy.  Unfortunately, he has been noncompliant with Vest therapy.  I had a very long and frank discussion with the patient, his brother, and nephew who accompany him today.  They are very clear in their understanding that he is at risk for further ventricular arrhythmias and cardiac arrest.  They also understand the reasons that he is not presently a candidate for ICD (comorbities, ETOH, ongoing weight loss/ failure to thrive, etc).  I have recommended that the patient  wear his lifevest and return in 3 months for further assessment.  The patient however is very clear in his desire to remove his lifevest today and not wear it anymore.  As his longterm prognosis is limited due to comorbidities (and probably less than 1 year), I think that this palliative approach is very reasonable. He should avoid QT prolonging drugs and ETOH.  Problem # 2:  ALCOHOL ABUSE (ICD-305.00) ETOH avoidance is paramount.  Problem # 3:  CHRONIC PANCREATITIS (ICD-577.1) Pt to follow-up with internal medicine  Patient Instructions: 1)  Your physician recommends that you schedule a follow-up appointment in: 3 months with Dr Rayann Heman and ASAP with Richardson Dopp

## 2010-11-09 NOTE — Letter (Signed)
Summary: [facility]  [facility]   Imported By: Roland Earl 07/29/2010 09:44:50  _____________________________________________________________________  External Attachment:    Type:   Image     Comment:   External Document

## 2010-11-09 NOTE — Assessment & Plan Note (Signed)
Summary: HOSP FU////KT   Vital Signs:  Patient profile:   55 year old male Weight:      97.50 pounds BMI:     14.88 Temp:     97 degrees F oral Pulse rate:   100 / minute Pulse rhythm:   regular Resp:     18 per minute BP sitting:   100 / 70  (left arm) Cuff size:   small  Vitals Entered By: Aquilla Solian CMA (August 24, 2010 11:00 AM) CC: Pt. is here for a f/u from [facility]. Pt. had a seizure on Friday (08/20/10). Pt. is also complaining of feet swelling after the ER visit. Pt. has been coughing for the past week. There has been some congestion aswell.  Pt. is needing refills on Flora-Q, needs multivitamins and  Nu-Iron 150mg . Pt. states there was a lump in the testicle area but states it is gone now.  Is Patient Diabetic? No Pain Assessment Patient in pain? no       Does patient need assistance? Functional Status Self care Ambulation Impaired:Risk for fall   Primary Care Provider:  Richardson Dopp, PA-C  CC:  Pt. is here for a f/u from [facility]. Pt. had a seizure on Friday (08/20/10). Pt. is also complaining of feet swelling after the ER visit. Pt. has been coughing for the past week. There has been some congestion aswell.  Pt. is needing refills on Flora-Q and needs multivitamins and  Nu-Iron 150mg . Pt. states there was a lump in the testicle area but states it is gone now. .  History of Present Illness: 1.  Pt. hospitalized end of October/beginning of November for abdominal pain, seizure.  Complicated pancreatic inflammation and pseudocyst formation and possible hepatic abscess noted on scans, as well as perineal abscess.  No growth from fluid aspirated from pseudocyst.  MRSA from perineal abscess.  Was treated with appropriate IV meds in hospital, then switched to Augmentin--to be treated for 3 weeks.  Not clear that he is taking.    2.  Pt. seen in ED end of last week for seizure.  Admits he was not taking meds as should as he was having pain from pancreatitis.  No alcohol for  some time--before stroke in the summer  3.  Hx of cardiac arrest with VF--follows with Dr. Rayann Heman at [facility].  Discussing implantable pacer/defibrillator.   4.  Perineal abscess:  pt. states this is doing fine--no tenderness or drainage any longer.  5.  Anemia:  Has had significant weight loss over past year and since stroke in the summer.  Iron studies show he is iron deficient.  Should be taking b12 and folate, but sounds like pt. is chronically noncompliant with meds.  Pt. states he is fatigued--has been for some time.  6.  Swelling in feet:  Started last week.  7.  Congested cough:  started last week as well.  Has not seen sputum.  Has had some anterior chest pain with cough and deep breathing.  No fever.  Sometimes short winded.  Cannot say whether received flu or pneumovax while in hospital recently.  No diarrhea.  Current Medications (verified): 1)  Creon 12000 Unit Cpep (Pancrelipase (Lip-Prot-Amyl)) .... Three Times A Day Before Meals 2)  Aspirin 81 Mg Tbec (Aspirin) .... Take One Tablet By Mouth Daily 3)  Keppra 500 Mg Tabs (Levetiracetam) .... Take 1 Tablet By Mouth Two Times A Day 4)  Ativan 1 Mg Tabs (Lorazepam) .... Take One By Mouth No More  Than Every 8 Hours As Needed For Anxiety or Agitation 5)  Pravastatin Sodium 20 Mg Tabs (Pravastatin Sodium) .... Take 1 Tab By Mouth At Bedtime For Cholesterol 6)  Vitamin B-1 100 Mg Tabs (Thiamine Hcl) .... Take 1 Tablet By Mouth Once A Day 7)  Flora-Q  Caps (Probiotic Product) .... Take One Daily 8)  Carafate 1 Gm Tabs (Sucralfate) .... Take One Tab By Mouth With Meals As Needed For Stomach Pain 9)  Robaxin 500 Mg Tabs (Methocarbamol) .... Take One By Mouth Every 6-8 Hours As Needed For Spasms 10)  Nexium 40 Mg Cpdr (Esomeprazole Magnesium) .... Take 1 Capsule By Mouth Once A Day For Stomach 11)  Boost  Liqd (Nutritional Supplements) .... Drink One Can Three Times A Day 12)  Zofran 4 Mg Tabs (Ondansetron Hcl) .... Take One By Mouth Two  Times A Day As Needed For Nausea 13)  Folic Acid 1 Mg Tabs (Folic Acid) .... Take 1 Tablet By Mouth Once A Day 14)  Multivitamins  Tabs (Multiple Vitamin) .... Take 1 Tablet By Mouth Once A Day - Get This Over The Counter 15)  Nitrostat 0.4 Mg Subl (Nitroglycerin) .Marland Kitchen.. 1 Sublingual As Needed Chest Pain 16)  Vitamin B-12 1000 Mcg Tabs (Cyanocobalamin) .... Take 1 Tablet By Mouth Once A Day 17)  Oxycodone Hcl 10 Mg Tabs (Oxycodone Hcl) .... Take One Tab Every 8 Hours As Needed For Severe Pain 18)  Nu-Iron 150 Mg Caps (Polysaccharide Iron Complex) .... Take 1 Capsule By Mouth Two Times A Day 19)  Oxycodone Hcl 10 Mg Tabs (Oxycodone Hcl) .... Take 1 Tablet Every 6-8 Hr As Needed For Pain 20)  Augmentin 875-125 Mg Tabs (Amoxicillin-Pot Clavulanate) .... Take 1 Tablet By Mouth Two Times A Day  Allergies (verified): 1)  ! Lisinopril  Physical Exam  General:  Cachectic male, appears chronically ill Head:  mild tenderness over maxillary sinuses Ears:  External ear exam shows no significant lesions or deformities.  Otoscopic examination reveals clear canals, tympanic membranes are intact bilaterally without bulging, retraction, inflammation or discharge. Hearing is grossly normal bilaterally. Nose:  External nasal examination shows no deformity or inflammation. Nasal mucosa are pink and moist without lesions or exudates. Mouth:  pharynx pink and moist, poor dentition, and teeth missing.   Neck:  No deformities, masses, or tenderness noted. Lungs:  Normal respiratory effort, chest expands symmetrically. Lungs are clear to auscultation, no crackles or wheezes. Abdomen:  Unable to examine well--pt. holds abdomen tight.normal bowel sounds.   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 Will set up pt. for reimaging of  liver/pancreas with follow up in 6 weeks. To call if increased abdominal pain, fever, inability to keep anything down. Strongly urged to be compliant with meds.  Problem # 2:  SEIZURE DISORDER (ICD-780.39) Encouraged compliance His updated medication list for this problem includes:    Keppra 500 Mg Tabs (Levetiracetam) .Marland Kitchen... Take 1 tablet by mouth two times a day  Problem # 3:  ANEMIA (ICD-285.9) Encouraged compliance. His updated medication list for this problem includes:    Folic Acid 1 Mg Tabs (Folic acid) .Marland Kitchen... Take 1 tablet by mouth once a day    Vitamin B-12 1000 Mcg Tabs (Cyanocobalamin) .Marland Kitchen... Take 1 tablet by mouth once a day    Nu-iron 150 Mg Caps (Polysaccharide iron complex) .Marland KitchenMarland KitchenMarland KitchenMarland Kitchen  Take 1 capsule by mouth two times a day  Problem # 4:  LEG EDEMA (ICD-782.3) Discussed most likely multifactorial--probable nutritional protein depletion with decreased oral intake, also with anemia, and finally, cirrhosis with associated electrolyte, protein depletion, fluid abnormalities. Work on improving anemia with iron, B12 supplements and protein with Boost shakes. Discussed elevation of legs  Problem # 5:  URI (ICD-465.9) supportive care. His updated medication list for this problem includes:    Aspirin 81 Mg Tbec (Aspirin) .Marland Kitchen... Take one tablet by mouth daily  Problem # 6:  Preventive Health Care (ICD-V70.0) Pneumovax and flu vaccine  Complete Medication List: 1)  Creon 12000 Unit Cpep (Pancrelipase (lip-prot-amyl)) .... Three times a day before meals 2)  Aspirin 81 Mg Tbec (Aspirin) .... Take one tablet by mouth daily 3)  Keppra 500 Mg Tabs (Levetiracetam) .... Take 1 tablet by mouth two times a day 4)  Ativan 1 Mg Tabs (Lorazepam) .... Take one by mouth no more than every 8 hours as needed for anxiety or agitation 5)  Pravastatin Sodium 20 Mg Tabs (Pravastatin sodium) .... Take 1 tab by mouth at bedtime for cholesterol 6)  Vitamin B-1 100 Mg Tabs (Thiamine hcl) .... Take 1 tablet by  mouth once a day 7)  Flora-q Caps (Probiotic product) .... Take one daily 8)  Carafate 1 Gm Tabs (Sucralfate) .... Take one tab by mouth with meals as needed for stomach pain 9)  Robaxin 500 Mg Tabs (Methocarbamol) .... Take one by mouth every 6-8 hours as needed for spasms 10)  Nexium 40 Mg Cpdr (Esomeprazole magnesium) .... Take 1 capsule by mouth once a day for stomach 11)  Boost Liqd (Nutritional supplements) .... Drink one can three times a day 12)  Zofran 4 Mg Tabs (Ondansetron hcl) .... Take one by mouth two times a day as needed for nausea 13)  Folic Acid 1 Mg Tabs (Folic acid) .... Take 1 tablet by mouth once a day 14)  Multivitamins Tabs (Multiple vitamin) .... Take 1 tablet by mouth once a day - get this over the counter 15)  Nitrostat 0.4 Mg Subl (Nitroglycerin) .Marland Kitchen.. 1 sublingual as needed chest pain 16)  Vitamin B-12 1000 Mcg Tabs (Cyanocobalamin) .... Take 1 tablet by mouth once a day 17)  Oxycodone Hcl 10 Mg Tabs (Oxycodone hcl) .... Take one tab every 8 hours as needed for severe pain 18)  Nu-iron 150 Mg Caps (Polysaccharide iron complex) .... Take 1 capsule by mouth two times a day 19)  Oxycodone Hcl 10 Mg Tabs (Oxycodone hcl) .... Take 1 tablet every 6-8 hr as needed for pain 20)  Augmentin 875-125 Mg Tabs (Amoxicillin-pot clavulanate) .... Take 1 tablet by mouth two times a day  Other Orders: Flu Vaccine 84yrs + [id number]) Admin 1st Vaccine [id number]) Pneumococcal Vaccine [id number]) Admin of Any Addtl Vaccine [id number])  Patient Instructions: 1)  Follow up with Dr. Amil Amen in 6 weeks--pancreatitis Prescriptions: NU-IRON 150 MG CAPS (POLYSACCHARIDE IRON COMPLEX) Take 1 capsule by mouth two times a day  #60 x 5   Entered and Authorized by:   Mack Hook MD   Signed by:   Mack Hook MD on 08/24/2010   Method used:   Electronically to        [facility] [phone][facility] (retail)       [address]       [place],   [address]       Ph:  [phone]       Fax: [phone]   [id number] MULTIVITAMINS  TABS (MULTIPLE VITAMIN) Take 1 tablet by mouth once a day - get this over the counter  #30 x 11   Entered and Authorized by:   Mack Hook MD   Signed by:   Mack Hook MD on 08/24/2010   Method used:   Electronically to        [facility] [phone]* (retail)       [address]       [place], [place]  [address]       Ph: [phone]       Fax: [phone]   [place]:   [phone] [place] (NUTRITIONAL SUPPLEMENTS) Drink one can three times a day  #90 x 11   Entered and Authorized by:   Mack Hook MD   Signed by:   Mack Hook MD on 08/24/2010   Method used:   Electronically to        [facility] [phone]* (retail)       [address]       [place], [place]  [address]       Ph: [phone]       Fax: [phone]   [place]:   [phone] FLORA-Q  CAPS (PROBIOTIC PRODUCT) take one daily  #30 x 11   Entered and Authorized by:   Mack Hook MD   Signed by:   Mack Hook MD on 08/24/2010   Method used:   Electron[facility] to        [facility] [phone]* (retail)       [address]       [place], [place]  [address]       Ph: [phone]       Fax: [phone]   RxID:   [id number]    Orders Added: 1)  Flu Vaccine 14yrs + [id number] 2)  Admin 1st Vaccine [id number] 3)  Pneumococcal Vaccine [id number])  Admin of Any Addtl Vaccine [[id number]] 5)  Est. Patient Level IV [id number]   Immunizations Administered:  Influenza Vaccine # 1:    Vaccine Type: Fluvax 3+    Site: left deltoid    Mfr: GlaxoSmithKline    Dose: 0.5 ml    Route: IM    Given by: Aquilla Solian CMA    Exp. Date: 04/09/2011    Lot #: [id number]    VIS given: 05/04/10 version given August 24, 2010.  Pneumonia Vaccine:    Vaccine Type: Pneumovax    Site: right deltoid    Mfr: Merck    Dose: 0.5 ml    Route: IM     Given by: Aquilla Solian CMA    Exp. Date: 12/28/2011    Lot #: [id number]    VIS given: 09/14/09 version given August 24, 2010.  Flu Vaccine Consent Questions:    Do you have a history of severe allergic reactions to this vaccine? no    Any prior history of allergic reactions to egg and/or gelatin? no    Do you have a sensitivity to the preservative Thimersol? no    Do you have a past history of Guillan-Barre Syndrome? no    Do you currently have an acute febrile illness? no    Have you ever had a severe  reaction to latex? no    Vaccine information given and explained to patient? yes   Immunizations Administered:  Influenza Vaccine # 1:    Vaccine Type: Fluvax 3+    Site: left deltoid    Mfr: GlaxoSmithKline    Dose: 0.5 ml    Route: IM    Given by: Aquilla Solian CMA    Exp. Date: 04/09/2011    Lot #: [id number]    VIS given: 05/04/10 version given August 24, 2010.  Pneumonia Vaccine:    Vaccine Type: Pneumovax    Site: right deltoid    Mfr: Merck    Dose: 0.5 ml    Route: IM    Given by: Aquilla Solian CMA    Exp. Date: 12/28/2011    Lot #: [id number]    VIS given: 09/14/09 version given August 24, 2010.

## 2010-11-09 NOTE — Letter (Signed)
Summary: Med List  Med List   Imported By: Marilynne Drivers 06/03/2010 16:20:01  _____________________________________________________________________  External Attachment:    Type:   Image     Comment:   External Document

## 2010-11-09 NOTE — Letter (Signed)
Summary: *Referral Letter  [facility]  [address] [place], [place] [address]   Phone: [phone]  Fax: [phone]    07/13/2010  Thank you in advance for agreeing to see my patient:  Steven Frey [address] [place], [place]  [address]  Phone: [phone]  Reason for Referral: 55 yo male with a h/o alcohol abuse and chronic pancreatitits.  His pain from pancreatitis has become gradually more difficult to control.  He was admitted in June 2011 for an out of hospital V. 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  He suffered an anoxic brain injury and was admitted to a nursing home after discharge.  He was just discharged from the nursing home.  Please help manage his chronic pancreatitis and pain.  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) 9)  ERECTILE DYSFUNCTION (ICD-302.72) 10)  VISUAL IMPAIRMENT (ICD-368.10) 11)  URINARY FREQUENCY (ICD-788.41) 12)  ALCOHOL ABUSE (ICD-305.00) 13)  CHRONIC PANCREATITIS (ICD-577.1) 14)  HYPERTENSION, BENIGN (ICD-401.1) 15)  CIGARETTE SMOKER (ICD-305.1) 16)  RESPIRATORY FAILURE, ACUTE (ICD-518.81) 17)  ANGIOEDEMA (ICD-995.1)   Current Medications: 1)  CREON 12000 UNIT CPEP (PANCRELIPASE (LIP-PROT-AMYL)) three times a day before meals 2)  ASPIRIN 81 MG TBEC (ASPIRIN) Take one tablet by mouth daily 3)  KEPPRA 500 MG TABS (LEVETIRACETAM) Take 1 tablet by mouth two times a day 4)  ATIVAN 1 MG TABS (LORAZEPAM) Take one by mouth no more than every 8 hours as needed for anxiety or agitation 5)  PRAVASTATIN SODIUM 20 MG TABS (PRAVASTATIN SODIUM) Take 1 tab by mouth at bedtime for cholesterol 6)  VITAMIN B-1 100 MG TABS  (THIAMINE HCL) Take 1 tablet by mouth once a day 7)  FLORA-Q  CAPS (PROBIOTIC PRODUCT) take one daily 8)  COREG 3.125 MG TABS (CARVEDILOL) take one tab twice a day for blood pressure 9)  CARAFATE 1 GM TABS (SUCRALFATE) Take one tab by mouth with meals as needed for stomach pain 10)  ROBAXIN 500 MG TABS (METHOCARBAMOL) Take one by mouth every 6-8 hours as needed for spasms 11)  NEXIUM 40 MG CPDR (ESOMEPRAZOLE MAGNESIUM) Take 1 capsule by mouth once a day for stomach 12)  BOOST  LIQD (NUTRITIONAL SUPPLEMENTS) Drink one can three times a day 13)  ZOFRAN 4 MG TABS (ONDANSETRON HCL) Take one by mouth two times a day as needed for nausea 14)  FOLIC ACID 1 MG TABS (FOLIC ACID) Take 1 tablet by mouth once a day 15)  MULTIVITAMINS  TABS (MULTIPLE VITAMIN) Take 1 tablet by mouth once a day - get this over the counter 16)  NITROSTAT 0.4 MG SUBL (NITROGLYCERIN) 1 sublingual as needed chest pain 17)  VITAMIN B-12 1000 MCG TABS (CYANOCOBALAMIN) Take 1 tablet by mouth once a day 18)  OXYCODONE HCL 10 MG TABS (OXYCODONE HCL) Take one tab every 8 hours as needed for severe pain 19)  NU-IRON 150 MG CAPS (POLYSACCHARIDE IRON COMPLEX) Take 1 capsule by mouth two times a day   Past Medical History: 1)  1. Angioedema rx requiring intubation/vent support suspected secondary to ACE I (but also on zithromax) 5/09 hospitalization 2)  2. Smoker 3)  3. Etoh abuse 4)  4. Chronic Pancreatitis (10/08 hosp) 5)  a.  admx 02/2010 . . . 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 Gastritis (alcohol induced) 8)  7. Depression . . . no hx of meds 9)  8. Borderline elevation in BP in past 10)  9.VF arrest 6/11 successfully rescucitated with prolonged hospitalization due to respiratory failure 11)  QT prolongation during cooling, now resolved   Prior History of Blood Transfusions:   Pertinent Labs:    Thank you again for agreeing to see our patient; please contact us if you have any further  questions or need additional information.  Sincerely,  Richardson Dopp PA-C

## 2010-11-09 NOTE — Miscellaneous (Signed)
Summary: Hospital Admission  INTERNAL MEDICINE ADMISSION HISTORY AND PHYSICAL Attending: Dr. Nance Pew First Contact: Dr. Silverio Decamp [phone] Second Contact: Dr. Ina Homes [phone]  PCP: Richardson Dopp PA-C, [facility]  CC: altered mental status  HPI: Mr. Steven Frey is a 55yo M with PMH of chronic pancreatitis and seizure disorder who presented to the ED with altered mental status following a seizure episode. History was obtained primarily from sister because patient was feeling tired and having trouble remembering events of the day. Two days ago, he began having increased epigastric pain and stopped eating and taking his medications (including Keppra), other than oxycodone Q8H for pain. He has also been having loose bowel movements for the last few days; no blood in stool. He denied nausea/vomiting, fever/chills. Early this afternoon, he had a seizure witnessed by his father. He had a second seizure in the ED, lasting approximately 3 min, associated with urinary incontinence, shaking, and post-ictal confusion. He also complains of perineal pain for the past week, but he did not notice any drainage.    ALLERGIES: ! LISINOPRIL Azithromycin  PAST MEDICAL HISTORY: 1. Angioedema rx requiring intubation/vent support suspected secondary to ACE I (but also on zithromax) 5/09 hospitalization 2. Smoker 3. Etoh abuse 4. Chronic Pancreatitis (10/08 hosp)     a.  admx 02/2010 . . . 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 Gastritis (alcohol induced) 7. Depression . . . no hx of meds 8. Borderline elevation in BP in past 9. VF arrest 6/11 successfully rescucitated with prolonged hospitalization due to respiratory failure QT prolongation during cooling, now resolved 10. 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 (03/2010)  MEDICATIONS: CREON 12000 UNIT CPEP (PANCRELIPASE (LIP-PROT-AMYL)) three times a day before meals ASPIRIN 81 MG TBEC (ASPIRIN) Take one tablet by mouth  daily KEPPRA 500 MG TABS (LEVETIRACETAM) Take 1 tablet by mouth two times a day ATIVAN 1 MG TABS (LORAZEPAM) Take one by mouth no more than every 8 hours as needed for anxiety or agitation PRAVASTATIN SODIUM 20 MG TABS (PRAVASTATIN SODIUM) Take 1 tab by mouth at bedtime for cholesterol VITAMIN B-1 100 MG TABS (THIAMINE HCL) Take 1 tablet by mouth once a day FLORA-Q  CAPS (PROBIOTIC PRODUCT) take one daily COREG 3.125 MG TABS (CARVEDILOL) take one tab twice a day for blood pressure CARAFATE 1 GM TABS (SUCRALFATE) Take one tab by mouth with meals as needed for stomach pain ROBAXIN 500 MG TABS (METHOCARBAMOL) Take one by mouth every 6-8 hours as needed for spasms NEXIUM 40 MG CPDR (ESOMEPRAZOLE MAGNESIUM) Take 1 capsule by mouth once a day for stomach BOOST  LIQD (NUTRITIONAL SUPPLEMENTS) Drink one can three times a day ZOFRAN 4 MG TABS (ONDANSETRON HCL) Take one by mouth two times a day as needed for nausea FOLIC ACID 1 MG TABS (FOLIC ACID) Take 1 tablet by mouth once a day MULTIVITAMINS  TABS (MULTIPLE VITAMIN) Take 1 tablet by mouth once a day - get this over the counter NITROSTAT 0.4 MG SUBL (NITROGLYCERIN) 1 sublingual as needed chest pain VITAMIN B-12 1000 MCG TABS (CYANOCOBALAMIN) Take 1 tablet by mouth once a day OXYCODONE HCL 10 MG TABS (OXYCODONE HCL) Take one tab every 8 hours as needed for severe pain NU-IRON 150 MG CAPS (POLYSACCHARIDE IRON COMPLEX) Take 1 capsule by mouth two times a day  SOCIAL HISTORY: h/o alcohol abuse, last used prior to hospitalization for VF arrest 6/11. Was initially discharged to nursing home but moved back in with his father approximately one month  ago. Stopped drinking 09/2009 and no longer smokes. Father helps care for him but sister visits him daily and manages his medications.  Previously worked in Clinical cytogeneticist. .  . laid off in 06/2008. Now on disability.  Patient is divorced with 2 children Drug use-no  FAMILY HISTORY: Mother deceased at ge 27 Father  living at 35 with hypertension sister living at age 82 with hypertension brother living at age 22 with no significant medical history No CAD in family No colon cancer  ROS: per HPI; all other systems reviewed and negative  VITALS: T:98.0  P:99  BP:111/58  R:18  O2SAT:99%  ON:RA PHYSICAL EXAM: General:  cachetic, older than stated age, appears fatigued, closes his eyes frequently.  Head:  normocephalic and atraumatic   Eyes:  pupils equal, pupils round, pupils reactive to light, no injection and anicteric.   Mouth:  pharynx pink and slightly dry, no erythema, and no exudates. Neck:  supple, no JVD.   Lungs:  (anterior exam due to weakness) normal breath sounds, no crackles, and no wheezes.  Heart:  tacchycardic, regular rhythm, no murmur, no gallop, and no rub.   Abdomen:  soft, acutely tender in epigastric area, normal to hypoactive bowel sounds, no distention, no guarding, no rebound tenderness. Msk:  no joint swelling, no joint warmth, and no redness over joints.    Pulses:  2+ DP/PT pulses bilaterally Extremities:  No cyanosis, clubbing, edema. Thin extremities.   Neurologic:  alert & oriented to place, person and situation, not oriented to year or month, cranial nerves II-XII intact, strength 2/5 in R arm and 3-4/5 in other extremities, sensation intact to light touch.   Skin:  skin somewhat dry and a few small (<1cm) raised erythematous plaques on forehead   GU: tender, indurated 4x4cm fluctuating, erythematous perineal mass with purulent drainage Psych:  Poor recall for recent events  LABS:  WBC                                      13.1       h      4.0-10.5         K/uL  RBC                                      3.52       l      4.22-5.81        MIL/uL  Hemoglobin (HGB)                         9.2        l      13.0-17.0        g/dL  Hematocrit (HCT)                         30.8       l      39.0-52.0        %  MCV                                      87.5               78.0-100.0  fL  MCH -                                    26.1              26.0-34.0        pg  MCHC                                     29.9       l      30.0-36.0        g/dL  RDW                                      18.2       h      11.5-15.5        %  Platelet Count (PLT)                     429        h      150-400          K/uL  Neutrophils, %                           88         h      43-77            %  Lymphocytes, %                           7          l      12-46            %  Monocytes, %                             5                 3-12             %  Eosinophils, %                           0                 0-5              %  Basophils, %                             0                 0-1              %  Neutrophils, Absolute                    11.5       h      1.7-7.7          K/uL  Lymphocytes, Absolute  0.9               0.7-4.0          K/uL  Monocytes, Absolute                      0.7               0.1-1.0          K/uL  Eosinophils, Absolute                    0.0               0.0-0.7          K/uL  Basophils, Absolute                      0.0               0.0-0.1          K/uL    Sodium (NA)                              141               135-145          mEq/L  Potassium (K)                            3.9               3.5-5.1          mEq/L  Chloride                                 102               96-112           mEq/L  CO2                                      23                19-32            mEq/L  Glucose                                  96                70-99            mg/dL  BUN                                      4          l      6-23             mg/dL  Creatinine                               0.85              0.4-1.5  mg/dL  GFR, Est Non African American            >60               >60              mL/min  GFR, Est African American                >60               >60              mL/min    Oversized comment,  see footnote  1  Bilirubin, Total                         0.9               0.3-1.2          mg/dL  Alkaline Phosphatase                     75                39-117           U/L  SGOT (AST)                               24                0-37             U/L  SGPT (ALT)                               11                0-53             U/L  Total  Protein                           6.6               6.0-8.3          g/dL  Albumin-Blood                            2.5        l      3.5-5.2          g/dL  Calcium                                  8.8               8.4-10.5         mg/dL   Color, Urine                             YELLOW            YELLOW  Appearance                               CLOUDY     a      CLEAR  Specific Gravity  1.019             1.005-1.030  pH                                       6.5               5.0-8.0  Urine Glucose                            NEGATIVE          NEG              mg/dL  Bilirubin                                NEGATIVE          NEG  Ketones                                  NEGATIVE          NEG              mg/dL  Blood                                    NEGATIVE          NEG  Protein                                  30         a      NEG              mg/dL  Urobilinogen                             1.0               0.0-1.0          mg/dL  Nitrite                                  NEGATIVE          NEG  Leukocytes                               NEGATIVE          NEG   Squamous Epithelial / LPF                FEW        a      RARE  Casts / HPF                              SEE NOTE.  a      NEG    HYALINE CASTS    EPITHELIAL CASTS  WBC / HPF  0-2               <3               WBC/hpf  RBC / HPF                                0-2               <3               RBC/hpf  Bacteria / HPF                           MANY       a      RARE  Urine-Other                              SEE NOTE.    MUCOUS  PRESENT   Protime ( Prothrombin Time)              14.8              11.6-15.2        seconds  INR                                      1.14              0.00-1.49   PTT(a-Partial Thromboplastn Time)        25                24-37            seconds   Creatine Kinase, Total                   106               7-232            U/L   Lipase                                   328        h      11-59            U/L   Lactic Acid, Venous                      7.6        h      0.5-2.2          mmol/L   Ammonia                                  64         h      11-35            umol/L  IMAGING:   CT ABDOMEN AND PELVIS WITH CONTRAST    Technique:  Multidetector CT imaging of the abdomen and pelvis was   performed following the standard protocol during bolus   administration of intravenous contrast.    Contrast: 80 ml Omnipaque-300.    Comparison: Most recent CT  06/04/2010.    Findings: Widespread edema and fluid throughout the abdomen most   pronounced in the upper abdomen.  Multiple upper abdominal fluid   collections are redemonstrated with very mild enhancement of the   wall developing since priors.  There is increased size of the fluid   collection in the body of the pancreas now measuring 32 x 17 mm.   Lesser sac collection is increased in size and irregular with cross-   sectional measurements of 20 x 28 mm.  Distended thin-walled   gallbladder with no visible stones.  No biliary ductal dilatation.    New irregular hypoattenuating lesion replacing much of the left   lobe of the liver measuring 35 x 54 mm.  This was not present in   August.  Hepatic abscess not excluded.  Unusual to develop a   neoplasm this quickly.  Central location suggests that does not   represent infarction.  Spleen appears normal.  Both kidneys excrete   contrast normally.  Patent aorta and major branches.    Left inguinal hernia contains fat.  There is a moderate amount of   free fluid in the pelvis.   Bladder contains air and urine status   post Foley catheterization.  No enlarged lymph nodes.  Small left   pleural effusion.  Cardiomegaly.  Scoliosis of lumbar spine   degenerative change.    IMPRESSION:   Progressive changes of chronic pancreatitis as described.  Acute   exacerbation not excluded.    New left lobe liver lesion could represent an abscess.  Tissue   sampling may be indicated.     ACUTE ABDOMEN SERIES (ABDOMEN 2 VIEW & CHEST 1 VIEW)    Comparison: 06/04/2010, 06/01/2010    Findings: The heart is enlarged.  There are perihilar bronchitic   changes.  Bilateral skin folds are present.  No focal   consolidations or pleural effusions are identified.  Old right rib   fracture identified.    Supine and decubitus views of the abdomen show a nonobstructive   bowel gas pattern.  There is contrast in the colon.  No evidence   for free intraperitoneal air.    IMPRESSION:    1.  Cardiomegaly.   2.  Bronchitic changes.   3.  Nonobstructive bowel gas pattern.   4.  Contrast in the colon.  ASSESSMENT AND PLAN: (1) Acute exacerbation of chronic pancreatitis. Elevated lipase, pain consistent with past episodes. Unclear what precipitated this episode as, per his sister, patient has been abstaining from alcohol for many months. On last admission, anti-seizure medication was changed from depakote to [place] because it was thought that this medication might be contributing to exacerbations of pancreatitis.   - Keep patient NPO.  - Hydrate with IVF - Morphine for pain control  (2) L lobe hepatic lesion. CT scan demonstrates L lobe hepatic lesion that is probably related to adjacent pancreatitis/pseudocysts but could represent hepatic abscess. By CT, lesion does not appear to extend into abdomenal cavity or pelvis.  - Plan to consult GI tomorrow for evaluation of and possible intervention for this hepatic lesion.  (3) Seizure disorder. Seizure activity likely secondary to patient  not taking his meds (including Keppra) for the past few days.  - Resume home dose of Keppra.    (4) Altered mental status. History suggest post-ictal confusion. However, given cirrhosis and elevated ammonia level, hepatic encephalopathy is also a concern. Will continue to monitor mental status. - Start lactulose - Recheck ammonia level  in the morning - Check UDS, TSH  (5) Perineal abscess.  - I&D performed at bedside - Wound culture obtained - Treat with vanc/Zosyn until culture results available - Will consult wound care for further management.   (6) Chronic anemia. Will continue iron supplementation.   (7) HTN. Holding Coreg as patient's blood pressure is relatively low.   (8) VTE PROPH: lovenox  Attending Physician: I have seen and examined the patient. I reviewed the resident/fellow note and agree with the findings and plan of care as documented. My additions and revisions are included.   Signature:

## 2010-11-09 NOTE — Progress Notes (Signed)
Summary: PA needed for Crestor  Phone Note Outgoing Call   Summary of Call: Received prior authorization request for Crestor .  Pt. doesn't show a history of having tried and failed preferred meds.  Do you want to change this medication to a preferred?  Initial call taken by: Sherian Maroon RN,  July 13, 2010 12:39 PM  Follow-up for Phone Call        Change to pravastatin 20 mg at bedtime. Rpt FLP and LFTs in 2 mos. Rx in basket to send to his pharmacy Richardson Dopp PA-C  July 13, 2010 1:28 PM   Appt. 09/16/10 -- sister made aware of Rx and confirmed appt. Follow-up by: Sherian Maroon RN,  July 13, 2010 3:20 PM    New/Updated Medications: PRAVASTATIN SODIUM 20 MG TABS (PRAVASTATIN SODIUM) Take 1 tab by mouth at bedtime for cholesterol Prescriptions: PRAVASTATIN SODIUM 20 MG TABS (PRAVASTATIN SODIUM) Take 1 tab by mouth at bedtime for cholesterol  #30 x 3   Entered and Authorized by:   Richardson Dopp PA-C   Signed by:   Richardson Dopp PA-C on 07/13/2010   Method used:   Printed then faxed to ...       [id number] (retail)       [address]       [place], [place]  [address]       Ph: [phone]       Fax: [phone]   RxID:   [id number]     Impression & Recommendations:  Problem # 1:  DYSLIPIDEMIA (ICD-272.4) not sure why he was on such a high dose of crestor lipid values ok in June when admxd no CAD on cath change to pravastatin and check FLP and LFTs in 2 mos  His updated medication list for this problem includes:    Pravastatin Sodium 20 Mg Tabs (Pravastatin sodium) .Marland Kitchen... Take 1 tab by mouth at bedtime for cholesterol  Complete Medication List: 1)  Creon 12000 Unit Cpep (Pancrelipase (lip-prot-amyl)) .... Three times a day before meals 2)  Aspirin 81 Mg Tbec (Aspirin) .... Take one tablet by mouth daily 3)  Keppra 500 Mg Tabs (Levetiracetam) .... Take 1 tablet by mouth two times a day 4)  Ativan 1 Mg Tabs (Lorazepam)  .... Take one by mouth no more than every 8 hours as needed for anxiety or agitation 5)  Pravastatin Sodium 20 Mg Tabs (Pravastatin sodium) .... Take 1 tab by mouth at bedtime for cholesterol 6)  Vitamin B-1 100 Mg Tabs (Thiamine hcl) .... Take 1 tablet by mouth once a day 7)  Flora-q Caps (Probiotic product) .... Take one daily 8)  Coreg 3.125 Mg Tabs (Carvedilol) .... Take one tab twice a day for blood pressure 9)  Carafate 1 Gm Tabs (Sucralfate) .... Take one tab by mouth with meals as needed for stomach pain 10)  Robaxin 500 Mg Tabs (Methocarbamol) .... Take one by mouth every 6-8 hours as needed for spasms 11)  Nexium 40 Mg Cpdr (Esomeprazole magnesium) .... Take 1 capsule by mouth once a day for stomach 12)  Boost Liqd (Nutritional supplements) .... Drink one can three times a day 13)  Zofran 4 Mg Tabs (Ondansetron hcl) .... Take one by mouth two times a day as needed for nausea 14)  Folic Acid 1 Mg Tabs (Folic acid) .... Take 1 tablet by mouth once a day 15)  Multivitamins Tabs (Multiple  vitamin) .... Take 1 tablet by mouth once a day - get this over the counter 16)  Nitrostat 0.4 Mg Subl (Nitroglycerin) .Marland Kitchen.. 1 sublingual as needed chest pain 17)  Vitamin B-12 1000 Mcg Tabs (Cyanocobalamin) .... Take 1 tablet by mouth once a day 18)  Oxycodone Hcl 10 Mg Tabs (Oxycodone hcl) .... Take one tab every 8 hours as needed for severe pain 19)  Nu-iron 150 Mg Caps (Polysaccharide iron complex) .... Take 1 capsule by mouth two times a day

## 2010-11-09 NOTE — Assessment & Plan Note (Signed)
Summary: PER DR ALLRED/WEIGHT LOSS/ALCHOL ABUSE/KT   Vital Signs:  Patient profile:   55 year old male Height:      68 inches Weight:      99.9 pounds BMI:     15.24 Temp:     97.6 degrees F oral Pulse rate:   88 / minute Pulse rhythm:   regular Resp:     18 per minute BP sitting:   132 / 82  (left arm) Cuff size:   regular  Vitals Entered By: Thailand Shannon (June 16, 2010 9:43 AM) CC: f/u per  pt says he wants to get out of nursing home... Is Patient Diabetic? No Pain Assessment Patient in pain? no       Does patient need assistance? Functional Status Self care Ambulation Normal   Primary Care Provider:  Richardson Dopp, PA-C  CC:  f/u per  pt says he wants to get out of nursing home....  History of Present Illness: 55 year old male presents for followup.  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  Prolonged hospital course.  This was complicated by ventilator dependent respiratory failure and anoxic brain injury.  He also developed seizures in the setting of anoxic brain injury.  Her cardiac catheterization performed.  This demonstrated normal coronary arteries.  His ejection fraction was normal.  Of note he was treated with a cooling protocol.  QT prolongation was noted.  He has followed up with electrophysiology.  He does not felt to be a candidate for AICD implantation.  He was however placed on a life vest.  The patient has been noncompliant with this and this is been discontinued.  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  He has been discharged to nursing home.  He is currently under the care of Dr. Dellia Nims at [place] [facility].  The patient has been in and out of the hospital since he was discharged with his presenting event.  He said a couple of episodes chest pain.  He's also had recurrent pancreatitis.  Depakote was recently discontinued secondary to possible exacerbation of pancreatitis.  He  was just in emergency room last week.  He did have a CT scan in the recent past of his abdomen that demonstrated some changes in the stomach.  The suggestion was made easy gastroenterology.  Long discussion with the patient and his sister today.  This is the first time I have seen him since his initial event in June of 2011.  His care should be completely managed by Dr. Dellia Nims who is his provider at the nursing home.  I spoke to his nurse, Edwena Felty, at [facility].  She confirmed Dr. Dellia Nims is following him.  They're currently trying to get him set up with gastroenterology for further management.  The patient apparently has Medicaid but has not been able to produce his card to them so that they can use this to refer him.  Current Medications (verified): 1)  Creon 12000 Unit Cpep (Pancrelipase (Lip-Prot-Amyl)) .... Uad 2)  Aspirin 81 Mg Tbec (Aspirin) .... Take One Tablet By Mouth Daily 3)  Keppra 500 Mg Tabs (Levetiracetam) .... Two Times A Day 4)  Ativan .... As Needed 5)  Crestor 40 Mg Tabs (Rosuvastatin Calcium) .... Take One Tablet By Mouth Daily. 6)  Vitamin B-1 100 Mg Tabs (Thiamine Hcl) .... Once Daily 7)  Prostate  Tabs (Specialty Vitamins Products) .... Once Daily 8)  Flora-Q  Caps (Probiotic Product) .... Take One Daily 9)  Ferralet 90 90-1 Mg Tabs (Fe Cbn-Fe Gluc-Fa-B12-C-Dss) .... Take One Tab Daily 10)  Coreg 3.125 Mg Tabs (Carvedilol) .... Take One Tab Twice A Day 11)  Crestor 40 Mg Tabs (Rosuvastatin Calcium) .... Take One Tab At Bedtime 12)  Carafate 2 Gm Tabs (Sucralfate) .... Take One As Needed For Heart Burn 13)  Robaxin 500 Mg Tabs (Methocarbamol) .... Take One Tab As Needed For Spasms 14)  Ativan 1 Mg Tabs (Lorazepam) .... Take One Tab As Needed For Anxiety 15)  Dilaudid 2 Mg Tabs (Hydromorphone Hcl) .... Take One Tab Every Four Hours As Needed For Pain  Allergies (verified): 1)  ! Lisinopril  Physical Exam  General:  cachetic.   Head:  normocephalic and atraumatic.     Neurologic:  cranial nerves II-XII intact.   Psych:  angry.     Impression & Recommendations:  Problem # 1:  CHRONIC PANCREATITIS (ICD-577.1) As above. I had a long discussion with the patient his sister today.  I tried to explain that he is being taken care of by Dr. Dellia Nims at this time.  I cannot discharge him from the nursing home.  He should continue to have his chronic disease management carried out by Dr. Dellia Nims who is involved in his care.  If he is discharged from the nursing home, I can see him back here at [facility].  I discussed this with Dr. Amil Amen today.  She concurs that the patient needs to follow up with Dr. Dellia Nims and discuss possible discharge from the nursing home with him.  I have again spoken to the patient's nurse, Edwena Felty, at [facility] home.  I have shared with her the concerns of the patient and his sister.  I have suggested that she arrange an appointment at the nursing home with Dr. Dellia Nims so that his concerns can be addressed.   The patient is to be set up with gastroenterology.  However, because they do not have access to his Medicaid information he had a hard time trying to get him in to see a gastroenterologist.  The nurse notes that they have repeatedly asked for the Medicaid card. The patient was quite angry today given the fact that he needed to follow up with Dr. Dellia Nims for his care as well as to discuss possible discharge to nursing home.  I tried to answer all of his questions the best I could.  Complete Medication List: 1)  Creon 12000 Unit Cpep (Pancrelipase (lip-prot-amyl)) .... Uad 2)  Aspirin 81 Mg Tbec (Aspirin) .... Take one tablet by mouth daily 3)  Keppra 500 Mg Tabs (Levetiracetam) .... Two times a day 4)  Ativan  .... As needed 5)  Crestor 40 Mg Tabs (Rosuvastatin calcium) .... Take one tablet by mouth daily. 6)  Vitamin B-1 100 Mg Tabs (Thiamine hcl) .... Once daily 7)  Prostate Tabs (Specialty vitamins products) .... Once daily 8)   Flora-q Caps (Probiotic product) .... Take one daily 9)  Ferralet 90 90-1 Mg Tabs (Fe cbn-fe gluc-fa-b12-c-dss) .... Take one tab daily 10)  Coreg 3.125 Mg Tabs (Carvedilol) .... Take one tab twice a day 11)  Crestor 40 Mg Tabs (Rosuvastatin calcium) .... Take one tab at bedtime 12)  Carafate 2 Gm Tabs (sucralfate)  .... Take one as needed for heart burn 13)  Robaxin 500 Mg Tabs (Methocarbamol) .... Take one tab as needed for spasms 14)  Ativan 1 Mg Tabs (Lorazepam) .... Take  one tab as needed for anxiety 15)  Dilaudid 2 Mg Tabs (Hydromorphone hcl) .... Take one tab every four hours as needed for pain  Patient Instructions: 1)  Dr. Dellia Nims is your provider while you are at [facility].  I understand you have frustrations.  I understand you want to be discharged from [facility].  However, I cannot make that decision.  You must discuss this with Dr. Dellia Nims.  Based upon your recent health issues and your current functional status, I believe you still need to be in a nursing home.  If you would like to be transferred, you should discuss this with Dr. Dellia Nims.  I spoke to your nurse at [facility].  They are arranging for you to see gastroenterology for management of your cirrhosis and pancreatitis.  She also informed me that they are going to start your Ensure supplementation again.  Please follow up on these items when you return and ask when your appointment with gastroenterology is to take place.   2)  I suggest you arrange a meeting with the case worker and Dr. Dellia Nims to discuss your concerns. 3)  If you are discharged from the nursing home to your home, I am happy to take care of you at [facility].  But for now, you must follow up with Dr. Dellia Nims.

## 2010-11-09 NOTE — Letter (Signed)
Summary: CARE MANAGEMENT  CARE MANAGEMENT   Imported By: Roland Earl 06/07/2010 16:33:32  _____________________________________________________________________  External Attachment:    Type:   Image     Comment:   External Document

## 2010-11-11 NOTE — Assessment & Plan Note (Signed)
Summary: 1 MO F/U ANXIETY/PAIN//RJE   Vital Signs:  Patient profile:   55 year old male Weight:      119 pounds Temp:     96.7 degrees F oral Pulse rate:   66 / minute Pulse rhythm:   regular Resp:     14 per minute BP sitting:   122 / 78  (left arm) Cuff size:   small  Vitals Entered By: Aquilla Solian CMA (November 02, 2010 5:09 PM) CC: 1 month f/u on anxiety and pain. Having pain on the right hand and right arm. More of a numbing pain.  Is Patient Diabetic? No Pain Assessment Patient in pain? yes     Location: right arm Intensity: 6 Type: sharp Onset of pain  Constant  Does patient need assistance? Functional Status Self care Ambulation Normal   Primary Care Provider:  Richardson Dopp, PA-C  CC:  1 month f/u on anxiety and pain. Having pain on the right hand and right arm. More of a numbing pain. Marland Kitchen  History of Present Illness: 1.  Right arm pain:  Started in hand, hurts in elbow and right shoulder.  Pt. states has had contracture and muscle spasm of right arm and hand since his stroke, but much more pronounced since last here.    Keppra was increased just before he was last seen and pt. was placed on Sertraline.  2.  Anxiety:  Has not been set up with [facility].  Taking Sertraline regularly.  Has not been on for a month as of yet.  Still wanting to take Benzo every 8 hours.  3.  [facility]--planning to go in and put a permanent stent in pancreatic duct on Thursday.    Current Medications (verified): 1)  Creon 12000 Unit Cpep (Pancrelipase (Lip-Prot-Amyl)) .Marland Kitchen.. 1 Cap 3  Times A Day Before Meals 2)  Levetiracetam 1000 Mg Tabs (Levetiracetam) .Marland Kitchen.. 1 Tab By Mouth Two Times A Day 3)  Ativan 1 Mg Tabs (Lorazepam) .... Take One By Mouth No More Than Every 8 Hours As Needed For Anxiety or Agitation 4)  Vitamin B-1 100 Mg Tabs (Thiamine Hcl) .... Take 1 Tablet By Mouth Once A Day 5)  Flora-Q  Caps (Probiotic Product) .... Take One Daily 6)  Carafate 1 Gm Tabs (Sucralfate) .... Take  One Tab By Mouth With Meals As Needed For Stomach Pain 7)  Robaxin 500 Mg Tabs (Methocarbamol) .... Take One By Mouth Every 6-8 Hours As Needed For Spasms 8)  Nexium 40 Mg Cpdr (Esomeprazole Magnesium) .Marland Kitchen.. 1 Capsule By Mouth Once A Day For Stomach 9)  Zofran 4 Mg Tabs (Ondansetron Hcl) .... Take One By Mouth Two Times A Day As Needed For Nausea 10)  Folic Acid 1 Mg Tabs (Folic Acid) .... Take 1 Tablet By Mouth Once A Day 11)  Multivitamins  Tabs (Multiple Vitamin) .... Take 1 Tablet By Mouth Once A Day - Get This Over The Counter 12)  Nitrostat 0.4 Mg Subl (Nitroglycerin) .Marland Kitchen.. 1 Sublingual As Needed Chest Pain 13)  Vitamin B-12 1000 Mcg Tabs (Cyanocobalamin) .... Take 1 Tablet By Mouth Once A Day 14)  Oxycodone Hcl 10 Mg Tabs (Oxycodone Hcl) .... Take 1 Tablet Every 6-8 Hr As Needed For Pain 15)  Carvedilol 3.125 Mg Tabs (Carvedilol) .... Take One Tablet By Mouth Twice A Day 16)  Ferrex 150 150 Mg Caps (Polysaccharide Iron Complex) .Marland Kitchen.. 1 Cap By Mouth Two Times A Day 17)  Sertraline Hcl 50 Mg Tabs (Sertraline  Hcl) .... 1/2 Tab By Mouth Daily For 7 Days, Then 1 Tab Daily Thereafter  Allergies (verified): 1)  ! Lisinopril 2)  ! * Azithromycin  Physical Exam  Msk:  NT over Cspine Extremities:  Right arm with more pronounced flexor contraction of elbow and hand with fingers in more of extensor contraction.  Frequent, intermittent muscle spasm of arm, hands and fingers noted. Neurologic:  biceps and triceps hyperreflexic   Impression & Recommendations:  Problem # 1:  ARM PAIN, RIGHT (ICD-729.5) Hypertonicity/muscle spasm of right arm and hand appears to be the cause of increased pain. Stop Zoloft--taper off over 3 days--to call if does not feel well reducing that quickly. If no improvement, will look at decreasing Keppra with hopefully, not allowing for increased frequency of seizure.  Problem # 2:  CHRONIC PANCREATITIS (ICD-577.1) Permanent stent to pancreatic duct apparently to be  placed in 2 days--[facility]  Complete Medication List: 1)  Creon 12000 Unit Cpep (Pancrelipase (lip-prot-amyl)) .Marland Kitchen.. 1 cap 3  times a day before meals 2)  Levetiracetam 1000 Mg Tabs (Levetiracetam) .Marland Kitchen.. 1 tab by mouth two times a day 3)  Ativan 1 Mg Tabs (Lorazepam) .... Take one by mouth no more than every 8 hours as needed for anxiety or agitation 4)  Vitamin B-1 100 Mg Tabs (Thiamine hcl) .... Take 1 tablet by mouth once a day 5)  Flora-q Caps (Probiotic product) .... Take one daily 6)  Carafate 1 Gm Tabs (Sucralfate) .... Take one tab by mouth with meals as needed for stomach pain 7)  Robaxin 500 Mg Tabs (Methocarbamol) .... Take one by mouth every 6-8 hours as needed for spasms 8)  Nexium 40 Mg Cpdr (Esomeprazole magnesium) .Marland Kitchen.. 1 capsule by mouth once a day for stomach 9)  Zofran 4 Mg Tabs (Ondansetron hcl) .... Take one by mouth two times a day as needed for nausea 10)  Folic Acid 1 Mg Tabs (Folic acid) .... Take 1 tablet by mouth once a day 11)  Multivitamins Tabs (Multiple vitamin) .... Take 1 tablet by mouth once a day - get this over the counter 12)  Nitrostat 0.4 Mg Subl (Nitroglycerin) .Marland Kitchen.. 1 sublingual as needed chest pain 13)  Vitamin B-12 1000 Mcg Tabs (Cyanocobalamin) .... Take 1 tablet by mouth once a day 14)  Oxycodone Hcl 10 Mg Tabs (Oxycodone hcl) .... Take 1 tablet every 6-8 hr as needed for pain 15)  Carvedilol 3.125 Mg Tabs (Carvedilol) .... Take one tablet by mouth twice a day 16)  Ferrex 150 150 Mg Caps (Polysaccharide iron complex) .Marland Kitchen.. 1 cap by mouth two times a day 17)  Sertraline Hcl 50 Mg Tabs (Sertraline hcl) .... 1/2 tab by mouth daily for 7 days, then 1 tab daily thereafter  Patient Instructions: 1)  Wean Sertraline to 1/2 tab daily for 2 days, then stop 2)  Follow up with Dr. Amil Amen in 1 month  for muscle spasm of right arm Prescriptions: OXYCODONE HCL 10 MG TABS (OXYCODONE HCL) take 1 tablet every 6-8 hr as needed for pain  #25 x 0   Entered and Authorized  by:   Mack Hook MD   Signed by:   Mack Hook MD on 11/02/2010   Method used:   Print then Give to Patient   RxID:   [id number]    Orders Added: 1)  Est. Patient Level III [id number]

## 2010-11-11 NOTE — Progress Notes (Signed)
Summary: Lost script  Phone Note Call from Patient   Summary of Call: spoke with Marita Kansas at pharmacy [facility] and they said that they missed placed the oxycodone script... i let her know that we did prescribe the med on 10-14-10 and the tech remembering taking from the pt but they can not find the hard copy... they would like to know can we send a hard copy to them Initial call taken by: [place] Shannon,  October 18, 2010 4:36 PM  Follow-up for Phone Call        Is Marita Kansas a Transport planner of pharmacy? Rx rewritten Follow-up by: Mack Hook MD,  October 18, 2010 10:11 PM  Additional Follow-up for Phone Call Additional follow up Details #1::        Marita Kansas is the pharmacist. Isla Pence  October 19, 2010 11:53 AM     Prescriptions: OXYCODONE HCL 10 MG TABS (OXYCODONE HCL) take 1 tablet every 6-8 hr as needed for pain  #25 x 0   Entered and Authorized by:   Mack Hook MD   Signed by:   Mack Hook MD on 10/18/2010   Method used:   Print then Give to Patient   RxID:   [id number]

## 2010-11-11 NOTE — Progress Notes (Signed)
Summary: meds refill  Phone Note Refill Request   Refills Requested: Medication #1:  OXYCODONE HCL 10 MG TABS take 1 tablet every 6-8 hr as needed for pain Steven Frey  Initial call taken by: Trula Ore,  October 12, 2010 2:13 PM  Follow-up for Phone Call        Can pick up tomorrow Follow-up by: Mack Hook MD,  October 14, 2010 9:20 AM  Additional Follow-up for Phone Call Additional follow up Details #1::        pt is aware Additional Follow-up by: Thailand Shannon,  October 14, 2010 11:16 AM    Prescriptions: OXYCODONE HCL 10 MG TABS (OXYCODONE HCL) take 1 tablet every 6-8 hr as needed for pain  #25 x 0   Entered and Authorized by:   Mack Hook MD   Signed by:   Mack Hook MD on 10/14/2010   Method used:   Print then Give to Patient   RxID:   [id number]

## 2010-11-11 NOTE — Progress Notes (Signed)
Summary: ARM IS PAINFUL AND STIFF  Phone Note Call from Patient Call back at Home Phone [phone]   Caller: linda hopkins-sister Reason for Call: Talk to Nurse Summary of Call: Steven Frey PT. [name] CALLED THIS MORNING AND SAYS THAT Steven Frey, [place] DR Donatella Walski SWITCHED HIM TO, IS CAUSING HIS RIGHT ARM TO BE VERY PAINFUL, AND VERY STIFF. SHE  IS NOT SURE IF THIS IS COMING FROM THE NEW MED OR NOT. [name] SAYS SHE WILL BE ON THE MOVE TODAY, BUT IF YOU CALL YOU CAN SPEAK WITH HER DAD, HIS NAME IS [name]. Initial call taken by: Roberto Scales,  October 18, 2010 8:53 AM  Follow-up for Phone Call        Having a lot of pain in his right hand, slowly going up right arm.  Started yesterday.  Denies trauma.  States pain is sharp, 9-10 on pain scale, goes down to 8.  Arm is very stiff where he can hardly move it.  Denies edema or redness, states some veins seem to be "sticking up" more than usual.  Denies headache, SOB, CP.  Hx of old fracture in right wrist with surgery that occured when he was in the [employer].  States never had symptoms like this since the surgery, has had stiffness, but not to this degree of immobility.  Has taken Robaxin and oxycodone -- Robaxin not effective, oxycodone only slightly effective.  Advised to use moist heat -- states has already tried that, not effective.  Pt. also states pharmacy did not receive refill for oxycodone -- pharmacy states has not received hard copy.  Pt. states that sister brought script to pharmacy.  Sister to call back when she returns. Follow-up by: Sherian Maroon RN,  October 18, 2010 12:59 PM  Additional Follow-up for Phone Call Additional follow up Details #1::        If he is having such extreme pain--should go to urgent care to be seen, particularly if develops swelling or redness. Additional Follow-up by: Mack Hook MD,  October 18, 2010 10:09 PM    Additional Follow-up for  Phone Call Additional follow up Details #2::    Unable to leave message - no answer.  Sherian Maroon RN  October 19, 2010 11:29 AM  No answer unable to leave message. Bridgett Larsson RN  October 19, 2010 4:10 PM  Unable to leave message - no answer.  Sherian Maroon RN  October 20, 2010 8:42 AM  Unable to leave message - no answer.  Letter sent.  Sherian Maroon RN  October 20, 2010 2:31 PM

## 2010-11-11 NOTE — Letter (Signed)
Summary: [place]   Imported By: Roland Earl 10/08/2010 14:27:28  _____________________________________________________________________  External Attachment:    Type:   Image     Comment:   External Document

## 2010-11-11 NOTE — Assessment & Plan Note (Signed)
Summary: pe rcheck out/sf   Visit Type:   Follow-up Primary Provider:  Richardson Dopp, PA-C   History of Present Illness: Steven Frey presents today for further EP follow-up.  He previously had out of hospital witnessed VF arrest 6/11 for which he was treated at [facility].  He had QT prolongation during cooling and required prolonged hospitalization due to respiratory failure.  He also has a h/o heavy ETOH with cirrhosis and chronic pancreatitis.  Most recently, he has been hospitalized for hepatic abscess vs concominant hepatocellular carcinoma (for which he is not felt to be a surgical candidate) and recurrent pancreatitis.  He subsequently underwent pancreatic ductal stenting and has done quite well.  The patient has now returned home.  He reports compliance with ETOH cessation. The patient denies symptoms of palpitations, chest pain, shortness of breath, orthopnea, PND, lower extremity edema, dizziness, presyncope, syncope, or neurologic sequela.  His primary concern is poor appetite and ongoing weight loss.  Current Medications (verified): 1)  Creon 12000 Unit Cpep (Pancrelipase (Lip-Prot-Amyl)) .... Three Times A Day Before Meals 2)  Keppra 500 Mg Tabs (Levetiracetam) .... Take 1 Tablet By Mouth Two Times A Day 3)  Ativan 1 Mg Tabs (Lorazepam) .... Take One By Mouth No More Than Every 8 Hours As Needed For Anxiety or Agitation 4)  Vitamin B-1 100 Mg Tabs (Thiamine Hcl) .... Take 1 Tablet By Mouth Once A Day 5)  Flora-Q  Caps (Probiotic Product) .... Take One Daily 6)  Carafate 1 Gm Tabs (Sucralfate) .... Take One Tab By Mouth With Meals As Needed For Stomach Pain 7)  Robaxin 500 Mg Tabs (Methocarbamol) .... Take One By Mouth Every 6-8 Hours As Needed For Spasms 8)  Nexium 40 Mg Cpdr (Esomeprazole Magnesium) .... Take 1 Capsule By Mouth Once A Day For Stomach 9)  Boost  Liqd (Nutritional Supplements) .... Drink One Can Three Times A Day 10)  Zofran 4 Mg Tabs (Ondansetron Hcl) ....  Take One By Mouth Two Times A Day As Needed For Nausea 11)  Folic Acid 1 Mg Tabs (Folic Acid) .... Take 1 Tablet By Mouth Once A Day 12)  Multivitamins  Tabs (Multiple Vitamin) .... Take 1 Tablet By Mouth Once A Day - Get This Over The Counter 13)  Nitrostat 0.4 Mg Subl (Nitroglycerin) .Marland Kitchen.. 1 Sublingual As Needed Chest Pain 14)  Vitamin B-12 1000 Mcg Tabs (Cyanocobalamin) .... Take 1 Tablet By Mouth Once A Day 15)  Oxycodone Hcl 10 Mg Tabs (Oxycodone Hcl) .... Take 1 Tablet Every 6-8 Hr As Needed For Pain 16)  Carvedilol 3.125 Mg Tabs (Carvedilol) .... Take One Tablet By Mouth Twice A Day  Allergies: 1)  ! Lisinopril  Past History:  Past Medical History: 1. Angioedema rx requiring intubation/vent support suspected secondary to ACE I (but also on zithromax) 5/09 hospitalization 2. Smoker 3. Etoh abuse 4. Chronic Pancreatitis (10/08 hosp)     a.  admx 02/2010 . . . pseudocyst aspirated during admxn     b. pancreatic ductal stent placement at [facility] 11/11 5. 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 Gastritis (alcohol induced) 7. Depression . . . no hx of meds 8. Borderline elevation in BP in past 9.VF arrest 6/11 successfully rescucitated with prolonged hospitalization due to respiratory failure QT prolongation during cooling, now resolved  Past Surgical History: right inguinal hernia repair right wrist ORIF for fx pancreatic stent placement  Social History: h/o alcohol abuse, last used prior to  hospitalization for VF arrest 6/11. He denies further ETOH since that time   a.  never been in [place], etc. Works in Clinical cytogeneticist. .  . laid off in 06/2008 Patient is divorced with 2 children Current Smoker (1/3-1 ppd for 30+ years) Drug use-no  Review of Systems       All systems are reviewed and negative except as listed in the HPI.   Vital Signs:  Patient profile:   55 year old male Height:      68 inches Weight:      108 pounds BMI:     16.48 Pulse rate:    103 / minute BP sitting:   104 / 70  (left arm)  Vitals Entered By: Margaretmary Bayley CMA (October 01, 2010 10:46 AM)  Physical Exam  General:  Cachectic male, appears chronically ill Head:  normocephalic and atraumatic Eyes:  PERRLA/EOM intact; conjunctiva and lids normal. Mouth:  pharynx pink and moist, poor dentition, and teeth missing.   Neck:  No deformities, masses, or tenderness noted. Lungs:  Normal respiratory effort, chest expands symmetrically. Lungs are clear to auscultation, no crackles or wheezes. Heart:  normal rate and regular rhythm.   Abdomen:  soft, nontender, +BS Msk:  diffuse muscle atrophy residual hemiparesis/weakness s/p prior stroke Extremities:  trace pitting edema of ankles Neurologic:  cranial nerves II-XII intact.  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  He is a very poor candidate for ICD given comorbidities which include liver abscess vs HCC. Pt is clear that he does not want an ICD at this time.  I agree and would not recommend ICD. He will continue medical management longterm.  Problem # 2:  HYPERTENSION, BENIGN (ICD-401.1) stable  Problem # 3:  CIGARETTE SMOKER (ICD-305.1) cessation advised  Problem # 4:  ALCOHOL ABUSE (ICD-305.00) longterm cessation encouraged  Patient Instructions: 1)  Your physician recommends that you schedule a follow-up appointment in: 3 months with DR Gailene Youkhana 2)  Your physician recommends that you continue on your current medications as directed. Please refer to the Current Medication list given to you today.

## 2010-11-11 NOTE — Letter (Signed)
Summary: Generic Letter  [facility]  [address] [place], [place] [address]   Phone: [phone]  Fax: [phone]        10/20/2010  Steven Frey [address] [place], [place]  [address]  Dear [name],  We have been unable to contact you by telephone.  Please call our office, at your earliest convenience, so that we may speak with you.  Sincerely,   Sherian Maroon RN

## 2010-11-11 NOTE — Progress Notes (Signed)
Summary: Discussion regarding medication side effects.  Phone Note Outgoing Call   Summary of Call: Called pt. to see if muscle spasm, hypertonicity in right arm any better with titration off Zoloft.  Pt. has only been off the Zoloft now for about 24 hours, so not long enough to see if any difference.  Discussed after looking at meds, could be an interaction between the Zoloft and Keppra, possibly a side effect from the increased dose of Keppra, possibly the Zoloft alone.  To continue with plan to discontinue the Zoloft first, see how he does, then taper down on Keppra if no improvement. Initial call taken by: Mack Hook MD,  November 06, 2010 12:10 PM

## 2010-11-11 NOTE — Progress Notes (Signed)
Summary: Right arm more painful  Phone Note Call from Patient   Summary of Call: States has been going on for about a week and a half -- see phone note of 10/18/10 -- Pain to right arm has increased greatly, fingers have stiffened up a lot, pain now goes from shoulder to fingers, painful to bend arm.  Denies swelling or redness, just increasing pain.   Has not sought other medical care.  Wants to see provider -- advised that there are no earlier appointments than 1/24.  States he is running out of pain meds - needs refill.  Oxycodone was refilled 10/18/10, #25.   Initial call taken by: Sherian Maroon RN,  October 28, 2010 1:05 PM  Follow-up for Phone Call        Pt sister Alita Chyle is calling back because she said that is been 72 hours and please, call her back whrn the rx is refill  [phone] Follow-up by: Maren Reamer,  October 28, 2010 2:12 PM  Additional Follow-up for Phone Call Additional follow up Details #1::        Pt.'s sister states he is out of [place] has been sending requests.  To date, we have not received any requests for this med- pharmacy advised.   Please refill.  Sherian Maroon RN  October 29, 2010 3:47 PM     Additional Follow-up for Phone Call Additional follow up Details #2::    Did he ever get seen as requested for the arm pain?   If he did not, needs to be seen as soon as we have an opening. The pain medication was to last through end of January. He said in the original note that he felt the "new medication"  was causing the pain--is he talking about the Sertraline?  Is he still taking it?  Why does he think it is his new medication?  Mack Hook MD  November 01, 2010 11:37 AM   Unable to contact pt. - no one answers phone.  Pt./sister previously stated because calls are marked "private."  Pt. had previously stated that he did not go to ED or UC as instructed.  Appt. was made last week for tomorrow, 11/02/10.  Had stated that thought it was new med  (unsure which one) because his symptoms hadn't started until he started taking it.  Unable to confirm appt. with pt./sister.  Sherian Maroon RN  November 01, 2010 12:50 PM   Prescriptions: LEVETIRACETAM 1000 MG TABS (LEVETIRACETAM) 1 tab by mouth two times a day  #60 x 4   Entered and Authorized by:   Mack Hook MD   Signed by:   Mack Hook MD on 10/29/2010   Method used:   [facility]        [phone]       [address]       [place], [place]  [address]       Ph: [phone]       Fax: [phone]   RxID:   [id number]

## 2010-11-11 NOTE — Letter (Signed)
Summary: CALL A NURSE  CALL A NURSE   Imported By: Roland Earl 10/05/2010 11:31:57  _____________________________________________________________________  External Attachment:    Type:   Image     Comment:   External Document

## 2010-11-11 NOTE — Progress Notes (Signed)
  Phone Note Other Incoming   Request: Send information Summary of Call: Request for records received from [employer] and [employer]. Request forwarded to [employer]. 2010 to present. X for signature.

## 2010-11-11 NOTE — Progress Notes (Signed)
Summary: poss seizures  Phone Note Call from Patient Call back at Home Phone [phone]   Caller: Cameron Ali Reason for Call: Refill Medication Summary of Call: Received call from [place] yesterday evening, but did not have access to pt's chart at the time.  Was requesting refill of ATivan.  Pt. had 3 tabs given via Nurse on Call on 09/21/10.  I sent in a prescription on 09/22/10 for #30 that was to last him 30 days, which has been the previous order.  He is requesting far too early--not due until 10/23/09.  Please check with pharmacy to see what his refill history of Ativan is. Initial call taken by: Mack Hook MD,  October 05, 2010 8:59 AM  Follow-up for Phone Call        [facility] and [facility] that Dr. Brigitte Pulse prescribe only three pills.... there is no other provider that has been prescribing this med to pt......Marland Kitchen Irene Limbo said that the sig on the script was one tab every eight hours so that meant three a day and that would last for only 10 days.... [place] Shannon  October 05, 2010 9:51 AM   Additional Follow-up for Phone Call Additional follow up Details #1::        rt arm then leg start jerking now jerking entire body. Completely out of Ativan, took last one yesterday. This is how he responds when he is getting ready to have a seizure. Takes Keppra 2 tabs daily. Will discuss with Dr. Amil Amen and call sister back.    Additional Follow-up for Phone Call Additional follow up Details #2::    per mulberry says the pt can have 10 pills til their appt on Dec. 30 and pt and doctor will talk about changing meds if needed.Marland KitchenMarland KitchenMarland KitchenThailand Shannon  October 05, 2010 10:35 AM  [place]--if those 10 tabs were called in , needs to be documented in med list--there is an option for telephone fill  Mack Hook MD  October 06, 2010 1:18 PM    done..[place] Shannon  October 06, 2010 1:55 PM   Prescriptions: ATIVAN 1 MG TABS (LORAZEPAM) Take one by mouth no  more than every 8 hours as needed for anxiety or agitation  #10 x 0   Entered by:   [place] Shannon   Authorized by:   Mack Hook MD   Signed by:   [place] Shannon on 10/06/2010   Method used:   [facility]phoned to ...       [id number]       [address]       [place], [place]  [address]       Ph: [phone]       Fax: [phone]   RxID:   [id number]

## 2010-11-11 NOTE — Progress Notes (Signed)
Summary: REFILL ON ATIVAN  Phone Note Call from Patient Call back at Home Phone [phone]   Reason for Call: Refill Medication Summary of Call: WEAVER [place]. FOR A REFILL ON HIS ATIVAN, BUT SO FAR NOTHING HAS BEEN SENT OVER [facility] [address]. Initial call taken by: Roberto Scales,  September 21, 2010 10:14 AM  Follow-up for Phone Call        Sent to E. Mulberry.  Sherian Maroon RN  September 21, 2010 10:26 AM   Additional Follow-up for Phone Call Additional follow up Details #1::        SISTER KEEPS CALLING AND SAYING THAT HE CANT KEEP WAITING TO GET [place] REFILLED. HE WAS IN THE HOSP AND SHE DIDNT REALIZE HIS MEDS WERE AS LOW AS THEY WERE, OR ELSE HE'LL END UP BACK IN THE HOSP. Additional Follow-up by: Roberto Scales,  September 21, 2010 3:00 PM    Additional Follow-up for Phone Call Additional follow up Details #2::    IF HE DOES'T GET HIS MEDICINE HE IS GOING TO END UP [place] .SISTERS # IS [id number] EXCEPT CALL BACK TODAY  [name] HOPKINS Follow-up by: Roland Earl,  September 22, 2010 9:39 AM  Additional Follow-up for Phone Call Additional follow up Details #3:: Details for Additional Follow-up Action Taken: Please explaine to sister that as an as needed medication for anxiety, this med will not keep him out of the hospital.  It should be used very sparingly --in fact, at some point will need to address with pt. whether he should be taking this or something else if he is having such extreme anxiety to send him to the hospital.  Please make sure he has an appt. coming up in next 2 months and can address this with him then.  Mack Hook MD  September 22, 2010 11:21 AM   pt's sister is aware... she let me know that if he doesnt take his ativan he will start getting aggervate and than that would cause seizure... they do have appt on dec 30 and can talk about alternatives than.Marland KitchenMarland KitchenMarland KitchenThailand Shannon  September 22, 2010 12:05  PM   Prescriptions: ATIVAN 1 MG TABS (LORAZEPAM) Take one by mouth no more than every 8 hours as needed for anxiety or agitation  #30 per month x 0   Entered and Authorized by:   Mack Hook MD   Signed by:   Mack Hook MD on 09/22/2010   Method used:   Telephoned to ...       [id number]       [address]       [place], [place]  [address]       Ph: [phone]       Fax: [phone]   RxID:   [id number]

## 2010-11-17 ENCOUNTER — Telehealth (INDEPENDENT_AMBULATORY_CARE_PROVIDER_SITE_OTHER): Payer: Self-pay | Admitting: Internal Medicine

## 2010-11-22 ENCOUNTER — Telehealth (INDEPENDENT_AMBULATORY_CARE_PROVIDER_SITE_OTHER): Payer: Self-pay | Admitting: Internal Medicine

## 2010-11-25 NOTE — Progress Notes (Signed)
Summary: [place] refill on his Ativan  Phone Note Call from Patient Call back at Home Phone [phone]   Caller: Alita Chyle- sister Reason for Call: Refill Medication Summary of Call: Kathlen Mody pt. Mr Kretzer sister called for a refill on his Ativan to be called in today because he doesn't have anymore. I explained to her that Dr. Amil Amen isn't here and that we will pass the request to our other Provider that is here and before I could finish, she cuts me off and says that Kim you don't have to talk to me this way and she and I have had problems since day one, and I told her no we haven't, and she still proceeded to talk loud on the phone to me, after she finished, I told her that this is our procedure to let her know what I was explaining to her, and she says ok and that he still needs his medicine today. Initial call taken by: Roberto Scales,  November 17, 2010 12:46 PM  Follow-up for Phone Call        rx printed. will be faxed to the pharmacy today.  notify pt Follow-up by: Aurora Mask FNP,  November 17, 2010 1:55 PM  Additional Follow-up for Phone Call Additional follow up Details #1::        Rx faxed to pharmacy.  Left message with male for pt. or sister to return call.  Sherian Maroon RN  November 17, 2010 3:43 PM  No answer.  Sherian Maroon RN  November 17, 2010 4:51 PM  Sister notified.  Sherian Maroon RN  November 17, 2010  5:30 PM  Additional Follow-up by: Sherian Maroon RN,  November 18, 2010 10:58 AM    Prescriptions: ATIVAN 1 MG TABS (LORAZEPAM) Take one by mouth no more than every 8 hours as needed for anxiety or agitation  #90 x 0   Entered and Authorized by:   Aurora Mask FNP   Signed by:   Aurora Mask FNP on 11/17/2010   Method used:   Printed then faxed to ...       [id number]       [address]       [place], [place]  [address]       Ph: [phone]       Fax: [phone]   RxID:   [id number]

## 2010-11-26 ENCOUNTER — Telehealth (INDEPENDENT_AMBULATORY_CARE_PROVIDER_SITE_OTHER): Payer: Self-pay | Admitting: Internal Medicine

## 2010-12-03 ENCOUNTER — Encounter (INDEPENDENT_AMBULATORY_CARE_PROVIDER_SITE_OTHER): Payer: Self-pay | Admitting: Internal Medicine

## 2010-12-03 ENCOUNTER — Encounter: Payer: Self-pay | Admitting: Internal Medicine

## 2010-12-07 NOTE — Assessment & Plan Note (Signed)
Summary: 6 week fu for pancreatitis//kt   Vital Signs:  Patient profile:   55 year old male Weight:      109.44 pounds Temp:     97 degrees F oral Pulse rate:   94 / minute Pulse rhythm:   regular Resp:     14 per minute BP sitting:   112 / 84  (left arm) Cuff size:   regular  Vitals Entered By: Aquilla Solian CMA (October 08, 2010 10:16 AM) CC: 6 week f/u on pancreatis. Has some questions in re: to pravastatin  Is Patient Diabetic? No Pain Assessment Patient in pain? no       Does patient need assistance? Functional Status Self care Ambulation Normal   Primary Care Provider:  Richardson Dopp, PA-C  CC:  6 week f/u on pancreatis. Has some questions in re: to pravastatin .  History of Present Illness: 1.  Complicated Pancreatitis:  Admitted since last seen here to [facility] on 08/25/10 for recurrent pancreatitis pain. Was ultimately transferred to [facility] under Dr. Kerin Ransom, GI care.  CT of abdomen prior to transfer did seem to show decreased size of areas of fluid collection, including that about the tail of the pancreas where felt to have a pancreatic duct obstruction as well.  Under Dr. Melissa Noon care subsequently underwent ERCP and stenting of stricture and significant ductal leak.  Surgery was then consulted and recommended conservative management with antibiotics and gradually improved.  Pt. states he is feeling much better.  Sister agrees.  Pt. and sister state he completed 5 days of continued oral Cipro and Metronidazole for the 5 days after discharge.  Discharged on 09/18/10.   Eating just fine now.   Only using Sulcralfate as needed, not regularly as prescribed.  2.  Anxiety:  Sister states they have been using Ativan three times a day.  Sounds like his anxiety is farily continuous.  Has not been treated with SSRI for this in past.  Resistant to switching from Ativan.  3.  Dyslipidemia:  off Pravastatin currently--stopped while in hospital.  4.  Alcohol ABuse:   Has stopped drinking.  Not interested in treatment/counseling.  5.  Vfib with arrest:  No plan for defibrillator placement with Dr. Rayann Heman secondary to pt. comorbidities and at pt's request not to.  Pt. was drinking heavily at the time of arrest with prolonged QT and electrolyte abnormalities.  No recurrence.  6.  Seizure Disorder:  Not clear if suffered another seizure as had fall with last hospitalization and EEG apparently abnormal even on Keppra.  Dose was doubled prior to discharge.  No definite seizure since, but pt. gets "shaky" when does not get ATivan on time.  Current Medications (verified): 1)  Creon 12000 Unit Cpep (Pancrelipase (Lip-Prot-Amyl)) .... Three Times A Day Before Meals 2)  Keppra 500 Mg Tabs (Levetiracetam) .... Take 1 Tablet By Mouth Two Times A Day 3)  Ativan 1 Mg Tabs (Lorazepam) .... Take One By Mouth No More Than Every 8 Hours As Needed For Anxiety or Agitation 4)  Vitamin B-1 100 Mg Tabs (Thiamine Hcl) .... Take 1 Tablet By Mouth Once A Day 5)  Flora-Q  Caps (Probiotic Product) .... Take One Daily 6)  Carafate 1 Gm Tabs (Sucralfate) .... Take One Tab By Mouth With Meals As Needed For Stomach Pain 7)  Robaxin 500 Mg Tabs (Methocarbamol) .... Take One By Mouth Every 6-8 Hours As Needed For Spasms 8)  Nexium 40 Mg Cpdr (Esomeprazole Magnesium) .... Take 1  Capsule By Mouth Once A Day For Stomach 9)  Boost  Liqd (Nutritional Supplements) .... Drink One Can Three Times A Day 10)  Zofran 4 Mg Tabs (Ondansetron Hcl) .... Take One By Mouth Two Times A Day As Needed For Nausea 11)  Folic Acid 1 Mg Tabs (Folic Acid) .... Take 1 Tablet By Mouth Once A Day 12)  Multivitamins  Tabs (Multiple Vitamin) .... Take 1 Tablet By Mouth Once A Day - Get This Over The Counter 13)  Nitrostat 0.4 Mg Subl (Nitroglycerin) .Marland Kitchen.. 1 Sublingual As Needed Chest Pain 14)  Vitamin B-12 1000 Mcg Tabs (Cyanocobalamin) .... Take 1 Tablet By Mouth Once A Day 15)  Oxycodone Hcl 10 Mg Tabs (Oxycodone Hcl)  .... Take 1 Tablet Every 6-8 Hr As Needed For Pain 16)  Carvedilol 3.125 Mg Tabs (Carvedilol) .... Take One Tablet By Mouth Twice A Day  Allergies: 1)  ! Lisinopril 2)  ! * Azithromycin  Physical Exam  General:  NAD--looks much better Lungs:  Normal respiratory effort, chest expands symmetrically. Lungs are clear to auscultation, no crackles or wheezes. Heart:  Normal rate and regular rhythm. S1 and S2 normal without gallop, murmur, click, rub or other extra sounds. Abdomen:  Mild epigastric tenderness soft, normal bowel sounds, no hepatomegaly, and no splenomegaly.     Impression & Recommendations:  Problem # 1:  ANXIETY (ICD-300.00) Plan to wean Ativan after taking Sertraline and hopefully get better control of anxiety. His updated medication list for this problem includes:    Ativan 1 Mg Tabs (Lorazepam) .Marland Kitchen... Take one by mouth no more than every 8 hours as needed for anxiety or agitation    Sertraline Hcl 50 Mg Tabs (Sertraline hcl) .Marland Kitchen... 1/2 tab by mouth daily for 7 days, then 1 tab daily thereafter  Orders: Psychology Referral (Psychology)  Merita Norton can address ETOH as well.  Problem # 2:  SEIZURE DISORDER (ICD-780.39) Dose increased beginning of month. His updated medication list for this problem includes:    Levetiracetam 1000 Mg Tabs (Levetiracetam) .Marland Kitchen... 1 tab by mouth two times a day  Problem # 3:  ALCOHOL ABUSE (ICD-305.00) Psychology referral.  Problem # 4:  CHRONIC PANCREATITIS (ICD-577.1) Improved with recent interventions--will continue with [facility]/Dr. 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 Multiple comor bidities at this time as well Pt. without recurrence from what we can tell His updated medication list for this problem includes:    Carvedilol 3.125 Mg  Tabs (Carvedilol) .Marland Kitchen... Take one tablet by mouth twice a day  Complete Medication List: 1)  Creon 12000 Unit Cpep (Pancrelipase (lip-prot-amyl)) .Marland Kitchen.. 1 cap 3  times a day before meals 2)  Levetiracetam 1000 Mg Tabs (Levetiracetam) .Marland Kitchen.. 1 tab by mouth two times a day 3)  Ativan 1 Mg Tabs (Lorazepam) .... Take one by mouth no more than every 8 hours as needed for anxiety or agitation 4)  Vitamin B-1 100 Mg Tabs (Thiamine hcl) .... Take 1 tablet by mouth once a day 5)  Flora-q Caps (Probiotic product) .... Take one daily 6)  Carafate 1 Gm Tabs (Sucralfate) .... Take one tab by mouth with meals as needed for stomach pain 7)  Robaxin 500 Mg Tabs (Methocarbamol) .... Take one by mouth every 6-8 hours as needed for spasms 8)  Nexium 40 Mg Cpdr (Esomeprazole magnesium) .Marland KitchenMarland KitchenMarland Kitchen  1 capsule by mouth once a day for stomach 9)  Zofran 4 Mg Tabs (Ondansetron hcl) .... Take one by mouth two times a day as needed for nausea 10)  Folic Acid 1 Mg Tabs (Folic acid) .... Take 1 tablet by mouth once a day 11)  Multivitamins Tabs (Multiple vitamin) .... Take 1 tablet by mouth once a day - get this over the counter 12)  Nitrostat 0.4 Mg Subl (Nitroglycerin) .Marland Kitchen.. 1 sublingual as needed chest pain 13)  Vitamin B-12 1000 Mcg Tabs (Cyanocobalamin) .... Take 1 tablet by mouth once a day 14)  Oxycodone Hcl 10 Mg Tabs (Oxycodone hcl) .... Take 1 tablet every 6-8 hr as needed for pain 15)  Carvedilol 3.125 Mg Tabs (Carvedilol) .... Take one tablet by mouth twice a day 16)  Ferrex 150 150 Mg Caps (Polysaccharide iron complex) .Marland Kitchen.. 1 cap by mouth two times a day 17)  Sertraline Hcl 50 Mg Tabs (Sertraline hcl) .... 1/2 tab by mouth daily for 7 days, then 1 tab daily thereafter  Patient Instructions: 1)  Follow up with Dr. Amil Amen in 1 month --anxiety Prescriptions: SERTRALINE HCL 50 MG TABS (SERTRALINE HCL) 1/2 tab by mouth daily for 7 days, then 1 tab daily thereafter  #30 x 2   Entered and Authorized by:   Mack Hook  MD   Signed by:   Mack Hook MD on 10/08/2010   Method used:   Electronically to        [facility] [phone]* (retail)       [address]       [place], [place]  [address]       Ph: [phone]       Fax: [phone]   [place]:   [id number] ATIVAN 1 MG TABS (LORAZEPAM) Take one by mouth no more than every 8 hours as needed for anxiety or agitation  #90 x 0   Entered and Authorized by:   Mack Hook MD   Signed by:   Mack Hook MD on 10/08/2010   Method used:   Print then Give to Patient   RxIDOR:[id number] [place] 150 150 MG CAPS (POLYSACCHARIDE IRON COMPLEX) 1 cap by mouth two times a day  #60 x 11   Entered and Authorized by:   Mack Hook MD   Signed by:   Mack Hook MD on 10/08/2010   Method used:   Electronically to        [facility] [phone]* (retail)       [address]       [place],   [address]       Ph: [phone]       Fax: [phone]   RxID:   [id number]    Orders Added: 1)  Psychology Referral [Psychology] 2)  Est. Patient Level IV [id number]

## 2010-12-07 NOTE — Assessment & Plan Note (Addendum)
Summary: 1 month f/u    Vital Signs:  Patient profile:   55 year old male Weight:      133.31 pounds Temp:     97.5 degrees F oral Pulse rate:   84 / minute Pulse rhythm:   regular Resp:     16 per minute BP sitting:   96 / 78  (left arm)  Vitals Entered By: Aquilla Solian CMA (December 03, 2010 10:09 AM) CC: 1 month f/u on right arm muscle spasms. Spasms are getting worse and right hand is tighten up. Having pain. Sister believes Setraline was not the cause of the spasms. Having neck cramps x2 months. Is Patient Diabetic? No Pain Assessment Patient in pain? yes       Does patient need assistance? Functional Status Self care Ambulation Normal   Primary Care Provider:  Richardson Dopp, PA-C  CC:  1 month f/u on right arm muscle spasms. Spasms are getting worse and right hand is tighten up. Having pain. Sister believes Setraline was not the cause of the spasms. Having neck cramps x2 months..  History of Present Illness: 1.  Right arm spasms: Came off Sertraline completely by end of January.  Has not noted any improvement in spasms since.  Next plan was to decrease Keppra--that info did not get to pt. on the 20th.  Pt. is still taking Methocarbamol, which I did not realize he still had.  Did not get Baclofen as on 20th note.Not using every day--does help.    2.  Pancreatitis with pseudocyst:  Imaging at [facility] showed significant improvement per pt.  Permanent stent was place 1 week after last OV.  Doing well with pain regarding this.  Discussed Oxycodone really for this, not the muscle spasm--need to work on weaning pain meds and treat spasm in other ways.  Current Medications (verified): 1)  Creon 12000 Unit Cpep (Pancrelipase (Lip-Prot-Amyl)) .Marland Kitchen.. 1 Cap 3  Times A Day Before Meals 2)  Levetiracetam 1000 Mg Tabs (Levetiracetam) .Marland Kitchen.. 1 Tab By Mouth Two Times A Day 3)  Ativan 1 Mg Tabs (Lorazepam) .... Take One By Mouth No More Than Every 8 Hours As Needed For Anxiety or Agitation 4)   Vitamin B-1 100 Mg Tabs (Thiamine Hcl) .... Take 1 Tablet By Mouth Once A Day 5)  Flora-Q  Caps (Probiotic Product) .... Take One Daily 6)  Carafate 1 Gm Tabs (Sucralfate) .... Take One Tab By Mouth With Meals As Needed For Stomach Pain 7)  Robaxin 500 Mg Tabs (Methocarbamol) .... Take One By Mouth Every 6-8 Hours As Needed For Spasms 8)  Nexium 40 Mg Cpdr (Esomeprazole Magnesium) .Marland Kitchen.. 1 Capsule By Mouth Once A Day For Stomach 9)  Zofran 4 Mg Tabs (Ondansetron Hcl) .... Take One By Mouth Two Times A Day As Needed For Nausea 10)  Folic Acid 1 Mg Tabs (Folic Acid) .... Take 1 Tablet By Mouth Once A Day 11)  Multivitamins  Tabs (Multiple Vitamin) .... Take 1 Tablet By Mouth Once A Day - Get This Over The Counter 12)  Nitrostat 0.4 Mg Subl (Nitroglycerin) .Marland Kitchen.. 1 Sublingual As Needed Chest Pain 13)  Vitamin B-12 1000 Mcg Tabs (Cyanocobalamin) .... Take 1 Tablet By Mouth Once A Day 14)  Oxycodone Hcl 10 Mg Tabs (Oxycodone Hcl) .... Take 1 Tablet Every 6-8 Hr As Needed For Pain 15)  Carvedilol 3.125 Mg Tabs (Carvedilol) .... Take One Tablet By Mouth Twice A Day 16)  Ferrex 150 150 Mg Caps (Polysaccharide Iron Complex) .Marland KitchenMarland KitchenMarland Kitchen  1 Cap By Mouth Two Times A Day 17)  Sertraline Hcl 50 Mg Tabs (Sertraline Hcl) .... 1/2 Tab By Mouth Daily For 7 Days, Then 1 Tab Daily Thereafter  Allergies (verified): 1)  ! Lisinopril 2)  ! * Azithromycin  Physical Exam  General:  NAD Lungs:  Normal respiratory effort, chest expands symmetrically. Lungs are clear to auscultation, no crackles or wheezes. Heart:  Normal rate and regular rhythm. S1 and S2 normal without gallop, murmur, click, rub or other extra sounds.  Radial pulses normal Neurologic:  Right arm with constant fasciculations--mainly in forearm  --ulnar fingers in flexed postiion, radial fingers in hyper flexed poisition at PIP/DIP, but flexed at MCPs.   Impression & Recommendations:  Problem # 1:  ARM PAIN, RIGHT (ICD-729.5)  Secondary to muscle  spasm--switching to Baclofen to see if helps better  Decrease Keppra to 500 mg in morning and 1000 mg at night  Orders: Physical Therapy Referral (PT)  Problem # 2:  CHRONIC PANCREATITIS (ICD-577.1) Doing well and having weight gain Plan is to ultimately wean pain meds that were originally written for this, but needing secondary to pain in arm now..  Problem # 3:  HYPERTENSION, BENIGN (ICD-401.1) No light headedness--continue Carvedilol unless gets symptomatic His updated medication list for this problem includes:    Carvedilol 3.125 Mg Tabs (Carvedilol) .Marland Kitchen... Take one tablet by mouth twice a day  Problem # 4:  ANXIETY (ICD-300.00) To keep Sertraline until can get muscle spasms under control--plan to ultimately wean Ativan His updated medication list for this problem includes:    Ativan 1 Mg Tabs (Lorazepam) .Marland Kitchen... Take one by mouth no more than every 8 hours as needed for anxiety or agitation    Sertraline Hcl 50 Mg Tabs (Sertraline hcl) .Marland Kitchen... 1/2 tab by mouth daily for 7 days, then 1 tab daily thereafter  Complete Medication List: 1)  Creon 12000 Unit Cpep (Pancrelipase (lip-prot-amyl)) .Marland Kitchen.. 1 cap 3  times a day before meals 2)  Levetiracetam 1000 Mg Tabs (Levetiracetam) .... 1/2 tab by mouth in morning and 1 tab by mouth in evening. 3)  Ativan 1 Mg Tabs (Lorazepam) .... Take one by mouth no more than every 8 hours as needed for anxiety or agitation 4)  Vitamin B-1 100 Mg Tabs (Thiamine hcl) .... Take 1 tablet by mouth once a day 5)  Flora-q Caps (Probiotic product) .... Take one daily 6)  Carafate 1 Gm Tabs (Sucralfate) .... Take one tab by mouth with meals as needed for stomach pain 7)  Nexium 40 Mg Cpdr (Esomeprazole magnesium) .Marland Kitchen.. 1 capsule by mouth once a day for stomach 8)  Zofran 4 Mg Tabs (Ondansetron hcl) .... Take one by mouth two times a day as needed for nausea 9)  Folic Acid 1 Mg Tabs (Folic acid) .... Take 1 tablet by mouth once a day 10)  Multivitamins Tabs (Multiple  vitamin) .... Take 1 tablet by mouth once a day - get this over the counter 11)  Nitrostat 0.4 Mg Subl (Nitroglycerin) .Marland Kitchen.. 1 sublingual as needed chest pain 12)  Vitamin B-12 1000 Mcg Tabs (Cyanocobalamin) .... Take 1 tablet by mouth once a day 13)  Oxycodone Hcl 10 Mg Tabs (Oxycodone hcl) .... Take 1 tablet every 6-8 hr as needed for pain 14)  Carvedilol 3.125 Mg Tabs (Carvedilol) .... Take one tablet by mouth twice a day 15)  Ferrex 150 150 Mg Caps (Polysaccharide iron complex) .Marland Kitchen.. 1 cap by mouth two times a day 16)  Sertraline  Hcl 50 Mg Tabs (Sertraline hcl) .... 1/2 tab by mouth daily for 7 days, then 1 tab daily thereafter 17)  Baclofen 10 Mg Tabs (Baclofen) .Marland Kitchen.. 1 tab by mouth three times a day for right arm muscle spasm--may fill only every 30 days  Patient Instructions: 1)  Follow up with Dr. Amil Amen in 1 month -6 weeks   Orders Added: 1)  Est. Patient Level IV [id number] 2)  Physical Therapy Referral [PT]  Appended Document: 1 month f/u  Addording to records obtained from [facility], the previous PD stent was actually just removed, no mention of a permanent stent placement on 11/09/10

## 2010-12-07 NOTE — Progress Notes (Addendum)
Summary: checking on ref for Neurologist  Phone Note Call from Patient Call back at Home Phone [phone]   Caller: Alita Chyle- sister Reason for Call: Referral Summary of Call: Steven Frey pt. ms hopkins called to check on Steven Frey Neurologist referral Initial call taken by: Roberto Scales,  November 26, 2010 11:56 AM  Follow-up for Phone Call        I don't see anything about neurologist referral.  Please advise.  Sherian Maroon RN  November 26, 2010 12:20 PM   Additional Follow-up for Phone Call Additional follow up Details #1::        I did not refer--wanted to see if his med changes were the cause of muscle spasms first. Additional Follow-up by: Mack Hook MD,  November 29, 2010 3:21 PM    Additional Follow-up for Phone Call Additional follow up Details #2::    Pt. here today to discuss--see ov note  Mack Hook MD  December 03, 2010 11:04 AM

## 2010-12-07 NOTE — Progress Notes (Addendum)
Summary: REFILL ON PAIN MEDS  Phone Note Call from Patient Call back at [phone]-dad's #   Caller: Wake Reason for Call: Refill Medication Summary of Call: Secret Kristensen PT. [name] CALLED FOR A REEFILL REQUEST FOR HIS OXYCODONE AND HE USES [facility] ON [address]. YOU CAN CALL THE OTHER # UP TOP AND ITS HER DAD, Sacha SR Initial call taken by: Roberto Scales,  November 22, 2010 11:44 AM  Follow-up for Phone Call        Need to know if right arm muscle spasm any better off Sertraline.   We will also need to discuss getting Ativan as we have previously discussed that he should not be taking so frequently and weaning. He is not due for a refill of the Oxycodone until the 24th as well. Follow-up by: Mack Hook MD,  November 23, 2010 8:45 AM  Additional Follow-up for Phone Call Additional follow up Details #1::        Left message on answer machine for pt. to return call. Bridgett Larsson RN  November 23, 2010 1:57 PM    Dr. Amil Amen -- Pt's. sister (Ms. Vaughan Basta) called -- Notified her of the above info. -- She says pt. is not getting better and that his pain is mostly from his right shoulder radiating to his fingers. -- He is taking 2-3 pills a day and has one more left. -- Reminded her of the pain contract pt. had signed -- Just wants to know what he can take for the pain in the meantime -- Adv. her that the pt. can take OTC ibuprofen. -- Aquilla Solian CMA  November 24, 2010 9:47 AM   Dr. Amil Amen -- Pt. called again -- States he is in too much pain -- Spasms are getting worse. -- Explained pt. the pain contract and adv. him of the above info but he was not satisfied -- Says 25 pills are not enough to last a month if he is to take 1 tab every 6-8 hours -- Would realy like for you to refill his med and increase the quant. being dispensed. -- Aquilla Solian CMA  November 24, 2010 12:15 PM     Additional Follow-up for Phone Call Additional follow up Details #2::    1.  How long  has he been off the Sertraline? 2.  Have them decrease his Keppra to 1/2 tab in the morning and 1 tab in the evening.  (they should be 1000 mg tabs)  3.  Would like to try Baclofen for the muscle spasms/spasticity as well. Ramon--see prescription in phone note--call into pharmacy for pt. please. Need to make sure he has not gone elswhere for pain meds--check to see if any ED notes and question sister please when you call her back. They should never use pain meds more than we discuss as he will run out of meds and go without from here on out. Will see how the Baclofen works along with decreased Arbutus.  If no improvement will refer to Neurology and consider refilling Oxycodone early.   Mack Hook MD  November 29, 2010 3:16 PM  Follow-up by: Mack Hook MD,  November 29, 2010 3:23 PM  Additional Follow-up for Phone Call Additional follow up Details #3:: Details for Additional Follow-up Action Taken: Pt. here today--above not previously relayed to pt.  Mack Hook MD  December 03, 2010 10:43 AM   New/Updated Medications: LEVETIRACETAM 1000 MG TABS (LEVETIRACETAM) 1/2 tab by mouth in morning and 1 tab  by mouth in evening. BACLOFEN 10 MG TABS (BACLOFEN) 1 tab by mouth every 12 hours as needed for muscle spasm Prescriptions: BACLOFEN 10 MG TABS (BACLOFEN) 1 tab by mouth every 12 hours as needed for muscle spasm  #20 x 0   Entered and Authorized by:   Mack Hook MD   Signed by:   Mack Hook MD on 12/03/2010   Method used:   Telephoned to ...       [id number] ([facility]ail)       [address]       [place], [place]  [address]       Ph: [phone]       Fax: [phone]   RxID:   [id number]

## 2010-12-08 ENCOUNTER — Telehealth (INDEPENDENT_AMBULATORY_CARE_PROVIDER_SITE_OTHER): Payer: Self-pay | Admitting: Internal Medicine

## 2010-12-16 NOTE — Letter (Signed)
Summary: [facility]/EPISODE SUMMARY REPORT  [facility]/EPISODE SUMMARY REPORT   Imported By: Roland Earl 12/07/2010 15:55:30  _____________________________________________________________________  External Attachment:    Type:   Image     Comment:   External Document

## 2010-12-17 ENCOUNTER — Telehealth (INDEPENDENT_AMBULATORY_CARE_PROVIDER_SITE_OTHER): Payer: Self-pay | Admitting: Internal Medicine

## 2010-12-20 ENCOUNTER — Telehealth (INDEPENDENT_AMBULATORY_CARE_PROVIDER_SITE_OTHER): Payer: Self-pay | Admitting: Internal Medicine

## 2010-12-21 LAB — CBC
HCT: 24.8 % — ABNORMAL LOW (ref 39.0–52.0)
HCT: 25.2 % — ABNORMAL LOW (ref 39.0–52.0)
HCT: 25.6 % — ABNORMAL LOW (ref 39.0–52.0)
HCT: 26.5 % — ABNORMAL LOW (ref 39.0–52.0)
HCT: 27.1 % — ABNORMAL LOW (ref 39.0–52.0)
HCT: 28.6 % — ABNORMAL LOW (ref 39.0–52.0)
HCT: 29.8 % — ABNORMAL LOW (ref 39.0–52.0)
HCT: 30 % — ABNORMAL LOW (ref 39.0–52.0)
HCT: 30.8 % — ABNORMAL LOW (ref 39.0–52.0)
HCT: 39.1 % (ref 39.0–52.0)
Hemoglobin: 11.4 g/dL — ABNORMAL LOW (ref 13.0–17.0)
Hemoglobin: 7.6 g/dL — ABNORMAL LOW (ref 13.0–17.0)
Hemoglobin: 7.7 g/dL — ABNORMAL LOW (ref 13.0–17.0)
Hemoglobin: 7.8 g/dL — ABNORMAL LOW (ref 13.0–17.0)
Hemoglobin: 7.8 g/dL — ABNORMAL LOW (ref 13.0–17.0)
Hemoglobin: 7.9 g/dL — ABNORMAL LOW (ref 13.0–17.0)
Hemoglobin: 8.5 g/dL — ABNORMAL LOW (ref 13.0–17.0)
Hemoglobin: 8.7 g/dL — ABNORMAL LOW (ref 13.0–17.0)
Hemoglobin: 9 g/dL — ABNORMAL LOW (ref 13.0–17.0)
Hemoglobin: 9 g/dL — ABNORMAL LOW (ref 13.0–17.0)
MCH: 25.3 pg — ABNORMAL LOW (ref 26.0–34.0)
MCH: 25.5 pg — ABNORMAL LOW (ref 26.0–34.0)
MCH: 25.7 pg — ABNORMAL LOW (ref 26.0–34.0)
MCH: 26.1 pg (ref 26.0–34.0)
MCH: 26.2 pg (ref 26.0–34.0)
MCH: 26.2 pg (ref 26.0–34.0)
MCH: 26.4 pg (ref 26.0–34.0)
MCH: 26.6 pg (ref 26.0–34.0)
MCH: 26.6 pg (ref 26.0–34.0)
MCH: 26.8 pg (ref 26.0–34.0)
MCHC: 28.8 g/dL — ABNORMAL LOW (ref 30.0–36.0)
MCHC: 29.2 g/dL — ABNORMAL LOW (ref 30.0–36.0)
MCHC: 29.2 g/dL — ABNORMAL LOW (ref 30.0–36.0)
MCHC: 29.2 g/dL — ABNORMAL LOW (ref 30.0–36.0)
MCHC: 29.4 g/dL — ABNORMAL LOW (ref 30.0–36.0)
MCHC: 29.7 g/dL — ABNORMAL LOW (ref 30.0–36.0)
MCHC: 30 g/dL (ref 30.0–36.0)
MCHC: 30.6 g/dL (ref 30.0–36.0)
MCHC: 30.6 g/dL (ref 30.0–36.0)
MCHC: 30.9 g/dL (ref 30.0–36.0)
MCV: 85.5 fL (ref 78.0–100.0)
MCV: 86.2 fL (ref 78.0–100.0)
MCV: 86.5 fL (ref 78.0–100.0)
MCV: 86.9 fL (ref 78.0–100.0)
MCV: 87 fL (ref 78.0–100.0)
MCV: 87.9 fL (ref 78.0–100.0)
MCV: 88.6 fL (ref 78.0–100.0)
MCV: 88.8 fL (ref 78.0–100.0)
MCV: 88.9 fL (ref 78.0–100.0)
MCV: 91.8 fL (ref 78.0–100.0)
Platelets: 469 10*3/uL — ABNORMAL HIGH (ref 150–400)
Platelets: 489 10*3/uL — ABNORMAL HIGH (ref 150–400)
Platelets: 503 10*3/uL — ABNORMAL HIGH (ref 150–400)
Platelets: 524 10*3/uL — ABNORMAL HIGH (ref 150–400)
Platelets: 534 10*3/uL — ABNORMAL HIGH (ref 150–400)
Platelets: 547 10*3/uL — ABNORMAL HIGH (ref 150–400)
Platelets: 550 10*3/uL — ABNORMAL HIGH (ref 150–400)
Platelets: 555 10*3/uL — ABNORMAL HIGH (ref 150–400)
Platelets: 558 10*3/uL — ABNORMAL HIGH (ref 150–400)
Platelets: 630 10*3/uL — ABNORMAL HIGH (ref 150–400)
RBC: 2.9 MIL/uL — ABNORMAL LOW (ref 4.22–5.81)
RBC: 2.9 MIL/uL — ABNORMAL LOW (ref 4.22–5.81)
RBC: 2.97 MIL/uL — ABNORMAL LOW (ref 4.22–5.81)
RBC: 2.98 MIL/uL — ABNORMAL LOW (ref 4.22–5.81)
RBC: 3.06 MIL/uL — ABNORMAL LOW (ref 4.22–5.81)
RBC: 3.22 MIL/uL — ABNORMAL LOW (ref 4.22–5.81)
RBC: 3.39 MIL/uL — ABNORMAL LOW (ref 4.22–5.81)
RBC: 3.45 MIL/uL — ABNORMAL LOW (ref 4.22–5.81)
RBC: 3.56 MIL/uL — ABNORMAL LOW (ref 4.22–5.81)
RBC: 4.26 MIL/uL (ref 4.22–5.81)
RDW: 18.1 % — ABNORMAL HIGH (ref 11.5–15.5)
RDW: 18.2 % — ABNORMAL HIGH (ref 11.5–15.5)
RDW: 18.2 % — ABNORMAL HIGH (ref 11.5–15.5)
RDW: 18.3 % — ABNORMAL HIGH (ref 11.5–15.5)
RDW: 18.6 % — ABNORMAL HIGH (ref 11.5–15.5)
RDW: 18.8 % — ABNORMAL HIGH (ref 11.5–15.5)
RDW: 18.9 % — ABNORMAL HIGH (ref 11.5–15.5)
RDW: 19 % — ABNORMAL HIGH (ref 11.5–15.5)
RDW: 19.4 % — ABNORMAL HIGH (ref 11.5–15.5)
RDW: 19.6 % — ABNORMAL HIGH (ref 11.5–15.5)
WBC: 11.5 10*3/uL — ABNORMAL HIGH (ref 4.0–10.5)
WBC: 12.6 10*3/uL — ABNORMAL HIGH (ref 4.0–10.5)
WBC: 12.9 10*3/uL — ABNORMAL HIGH (ref 4.0–10.5)
WBC: 15.2 10*3/uL — ABNORMAL HIGH (ref 4.0–10.5)
WBC: 5.1 10*3/uL (ref 4.0–10.5)
WBC: 6.3 10*3/uL (ref 4.0–10.5)
WBC: 6.4 10*3/uL (ref 4.0–10.5)
WBC: 7.6 10*3/uL (ref 4.0–10.5)
WBC: 7.8 10*3/uL (ref 4.0–10.5)
WBC: 9.7 10*3/uL (ref 4.0–10.5)

## 2010-12-21 LAB — RAPID URINE DRUG SCREEN, HOSP PERFORMED
Amphetamines: NOT DETECTED
Barbiturates: NOT DETECTED
Benzodiazepines: POSITIVE — AB
Cocaine: NOT DETECTED
Opiates: POSITIVE — AB
Tetrahydrocannabinol: NOT DETECTED

## 2010-12-21 LAB — DIFFERENTIAL
Basophils Absolute: 0 10*3/uL (ref 0.0–0.1)
Basophils Absolute: 0 10*3/uL (ref 0.0–0.1)
Basophils Absolute: 0 10*3/uL (ref 0.0–0.1)
Basophils Relative: 0 % (ref 0–1)
Basophils Relative: 0 % (ref 0–1)
Basophils Relative: 0 % (ref 0–1)
Eosinophils Absolute: 0 10*3/uL (ref 0.0–0.7)
Eosinophils Absolute: 0.1 10*3/uL (ref 0.0–0.7)
Eosinophils Absolute: 0.1 10*3/uL (ref 0.0–0.7)
Eosinophils Relative: 0 % (ref 0–5)
Eosinophils Relative: 1 % (ref 0–5)
Eosinophils Relative: 1 % (ref 0–5)
Lymphocytes Relative: 14 % (ref 12–46)
Lymphocytes Relative: 16 % (ref 12–46)
Lymphocytes Relative: 18 % (ref 12–46)
Lymphs Abs: 1.1 10*3/uL (ref 0.7–4.0)
Lymphs Abs: 1.7 10*3/uL (ref 0.7–4.0)
Lymphs Abs: 2 10*3/uL (ref 0.7–4.0)
Monocytes Absolute: 0.4 10*3/uL (ref 0.1–1.0)
Monocytes Absolute: 0.6 10*3/uL (ref 0.1–1.0)
Monocytes Absolute: 0.8 10*3/uL (ref 0.1–1.0)
Monocytes Relative: 5 % (ref 3–12)
Monocytes Relative: 6 % (ref 3–12)
Monocytes Relative: 6 % (ref 3–12)
Neutro Abs: 6 10*3/uL (ref 1.7–7.7)
Neutro Abs: 7.4 10*3/uL (ref 1.7–7.7)
Neutro Abs: 9.8 10*3/uL — ABNORMAL HIGH (ref 1.7–7.7)
Neutrophils Relative %: 76 % (ref 43–77)
Neutrophils Relative %: 77 % (ref 43–77)
Neutrophils Relative %: 80 % — ABNORMAL HIGH (ref 43–77)
Smear Review: INCREASED

## 2010-12-21 LAB — COMPREHENSIVE METABOLIC PANEL
ALT: 10 U/L (ref 0–53)
ALT: 10 U/L (ref 0–53)
ALT: 10 U/L (ref 0–53)
ALT: 10 U/L (ref 0–53)
ALT: <8 U/L (ref 0–53)
ALT: <8 U/L (ref 0–53)
AST: 11 U/L (ref 0–37)
AST: 11 U/L (ref 0–37)
AST: 13 U/L (ref 0–37)
AST: 19 U/L (ref 0–37)
AST: 20 U/L (ref 0–37)
AST: 23 U/L (ref 0–37)
Albumin: 1.9 g/dL — ABNORMAL LOW (ref 3.5–5.2)
Albumin: 1.9 g/dL — ABNORMAL LOW (ref 3.5–5.2)
Albumin: 2 g/dL — ABNORMAL LOW (ref 3.5–5.2)
Albumin: 2.2 g/dL — ABNORMAL LOW (ref 3.5–5.2)
Albumin: 2.3 g/dL — ABNORMAL LOW (ref 3.5–5.2)
Albumin: 2.5 g/dL — ABNORMAL LOW (ref 3.5–5.2)
Alkaline Phosphatase: 49 U/L (ref 39–117)
Alkaline Phosphatase: 50 U/L (ref 39–117)
Alkaline Phosphatase: 52 U/L (ref 39–117)
Alkaline Phosphatase: 56 U/L (ref 39–117)
Alkaline Phosphatase: 56 U/L (ref 39–117)
Alkaline Phosphatase: 65 U/L (ref 39–117)
BUN: 1 mg/dL — ABNORMAL LOW (ref 6–23)
BUN: 2 mg/dL — ABNORMAL LOW (ref 6–23)
BUN: 4 mg/dL — ABNORMAL LOW (ref 6–23)
BUN: 5 mg/dL — ABNORMAL LOW (ref 6–23)
BUN: 7 mg/dL (ref 6–23)
BUN: <1 mg/dL — ABNORMAL LOW (ref 6–23)
CO2: 26 mEq/L (ref 19–32)
CO2: 28 mEq/L (ref 19–32)
CO2: 28 mEq/L (ref 19–32)
CO2: 29 mEq/L (ref 19–32)
CO2: 29 mEq/L (ref 19–32)
CO2: 31 mEq/L (ref 19–32)
Calcium: 8.5 mg/dL (ref 8.4–10.5)
Calcium: 8.5 mg/dL (ref 8.4–10.5)
Calcium: 8.6 mg/dL (ref 8.4–10.5)
Calcium: 8.6 mg/dL (ref 8.4–10.5)
Calcium: 8.6 mg/dL (ref 8.4–10.5)
Calcium: 8.7 mg/dL (ref 8.4–10.5)
Chloride: 102 mEq/L (ref 96–112)
Chloride: 103 mEq/L (ref 96–112)
Chloride: 105 mEq/L (ref 96–112)
Chloride: 106 mEq/L (ref 96–112)
Chloride: 108 mEq/L (ref 96–112)
Chloride: 99 mEq/L (ref 96–112)
Creatinine, Ser: 0.47 mg/dL (ref 0.4–1.5)
Creatinine, Ser: 0.51 mg/dL (ref 0.4–1.5)
Creatinine, Ser: 0.51 mg/dL (ref 0.4–1.5)
Creatinine, Ser: 0.66 mg/dL (ref 0.4–1.5)
Creatinine, Ser: 0.68 mg/dL (ref 0.4–1.5)
Creatinine, Ser: 0.71 mg/dL (ref 0.4–1.5)
GFR calc Af Amer: >60 mL/min (ref >60)
GFR calc Af Amer: >60 mL/min (ref >60)
GFR calc Af Amer: >60 mL/min (ref >60)
GFR calc Af Amer: >60 mL/min (ref >60)
GFR calc Af Amer: >60 mL/min (ref >60)
GFR calc Af Amer: >60 mL/min (ref >60)
GFR calc non Af Amer: >60 mL/min (ref >60)
GFR calc non Af Amer: >60 mL/min (ref >60)
GFR calc non Af Amer: >60 mL/min (ref >60)
GFR calc non Af Amer: >60 mL/min (ref >60)
GFR calc non Af Amer: >60 mL/min (ref >60)
GFR calc non Af Amer: >60 mL/min (ref >60)
Glucose, Bld: 100 mg/dL — ABNORMAL HIGH (ref 70–99)
Glucose, Bld: 103 mg/dL — ABNORMAL HIGH (ref 70–99)
Glucose, Bld: 108 mg/dL — ABNORMAL HIGH (ref 70–99)
Glucose, Bld: 113 mg/dL — ABNORMAL HIGH (ref 70–99)
Glucose, Bld: 118 mg/dL — ABNORMAL HIGH (ref 70–99)
Glucose, Bld: 86 mg/dL (ref 70–99)
Potassium: 3.5 mEq/L (ref 3.5–5.1)
Potassium: 3.7 mEq/L (ref 3.5–5.1)
Potassium: 3.8 mEq/L (ref 3.5–5.1)
Potassium: 3.8 mEq/L (ref 3.5–5.1)
Potassium: 4 mEq/L (ref 3.5–5.1)
Potassium: 4.1 mEq/L (ref 3.5–5.1)
Sodium: 138 mEq/L (ref 135–145)
Sodium: 139 mEq/L (ref 135–145)
Sodium: 139 mEq/L (ref 135–145)
Sodium: 140 mEq/L (ref 135–145)
Sodium: 141 mEq/L (ref 135–145)
Sodium: 141 mEq/L (ref 135–145)
Total Bilirubin: 0.2 mg/dL — ABNORMAL LOW (ref 0.3–1.2)
Total Bilirubin: 0.2 mg/dL — ABNORMAL LOW (ref 0.3–1.2)
Total Bilirubin: 0.2 mg/dL — ABNORMAL LOW (ref 0.3–1.2)
Total Bilirubin: 0.3 mg/dL (ref 0.3–1.2)
Total Bilirubin: 0.3 mg/dL (ref 0.3–1.2)
Total Bilirubin: 0.3 mg/dL (ref 0.3–1.2)
Total Protein: 5.5 g/dL — ABNORMAL LOW (ref 6.0–8.3)
Total Protein: 5.6 g/dL — ABNORMAL LOW (ref 6.0–8.3)
Total Protein: 5.8 g/dL — ABNORMAL LOW (ref 6.0–8.3)
Total Protein: 6.8 g/dL (ref 6.0–8.3)
Total Protein: 7.1 g/dL (ref 6.0–8.3)
Total Protein: 7.2 g/dL (ref 6.0–8.3)

## 2010-12-21 LAB — URINALYSIS, ROUTINE W REFLEX MICROSCOPIC
Bilirubin Urine: NEGATIVE
Glucose, UA: NEGATIVE mg/dL
Glucose, UA: NEGATIVE mg/dL
Hgb urine dipstick: NEGATIVE
Hgb urine dipstick: NEGATIVE
Ketones, ur: NEGATIVE mg/dL
Ketones, ur: NEGATIVE mg/dL
Leukocytes, UA: NEGATIVE
Leukocytes, UA: NEGATIVE
Nitrite: NEGATIVE
Nitrite: NEGATIVE
Protein, ur: 30 mg/dL — AB
Protein, ur: 30 mg/dL — AB
Specific Gravity, Urine: 1.022 (ref 1.005–1.030)
Specific Gravity, Urine: 1.025 (ref 1.005–1.030)
Urobilinogen, UA: 1 mg/dL (ref 0.0–1.0)
Urobilinogen, UA: 1 mg/dL (ref 0.0–1.0)
pH: 5.5 (ref 5.0–8.0)
pH: 6.5 (ref 5.0–8.0)

## 2010-12-21 LAB — BASIC METABOLIC PANEL
BUN: 1 mg/dL — ABNORMAL LOW (ref 6–23)
BUN: 3 mg/dL — ABNORMAL LOW (ref 6–23)
BUN: 3 mg/dL — ABNORMAL LOW (ref 6–23)
CO2: 25 mEq/L (ref 19–32)
CO2: 28 mEq/L (ref 19–32)
CO2: 30 mEq/L (ref 19–32)
Calcium: 8 mg/dL — ABNORMAL LOW (ref 8.4–10.5)
Calcium: 8 mg/dL — ABNORMAL LOW (ref 8.4–10.5)
Calcium: 8.2 mg/dL — ABNORMAL LOW (ref 8.4–10.5)
Chloride: 100 mEq/L (ref 96–112)
Chloride: 102 mEq/L (ref 96–112)
Chloride: 104 mEq/L (ref 96–112)
Creatinine, Ser: 0.58 mg/dL (ref 0.4–1.5)
Creatinine, Ser: 0.65 mg/dL (ref 0.4–1.5)
Creatinine, Ser: 0.74 mg/dL (ref 0.4–1.5)
GFR calc Af Amer: >60 mL/min (ref >60)
GFR calc Af Amer: >60 mL/min (ref >60)
GFR calc Af Amer: >60 mL/min (ref >60)
GFR calc non Af Amer: >60 mL/min (ref >60)
GFR calc non Af Amer: >60 mL/min (ref >60)
GFR calc non Af Amer: >60 mL/min (ref >60)
Glucose, Bld: 70 mg/dL (ref 70–99)
Glucose, Bld: 78 mg/dL (ref 70–99)
Glucose, Bld: 92 mg/dL (ref 70–99)
Potassium: 3.1 mEq/L — ABNORMAL LOW (ref 3.5–5.1)
Potassium: 3.4 mEq/L — ABNORMAL LOW (ref 3.5–5.1)
Potassium: 3.6 mEq/L (ref 3.5–5.1)
Sodium: 136 mEq/L (ref 135–145)
Sodium: 137 mEq/L (ref 135–145)
Sodium: 138 mEq/L (ref 135–145)

## 2010-12-21 LAB — URINE MICROSCOPIC-ADD ON

## 2010-12-21 LAB — LIPASE, BLOOD
Lipase: 118 U/L — ABNORMAL HIGH (ref 11–59)
Lipase: 180 U/L — ABNORMAL HIGH (ref 11–59)
Lipase: 51 U/L (ref 11–59)
Lipase: 71 U/L — ABNORMAL HIGH (ref 11–59)
Lipase: 73 U/L — ABNORMAL HIGH (ref 11–59)
Lipase: 78 U/L — ABNORMAL HIGH (ref 11–59)
Lipase: 98 U/L — ABNORMAL HIGH (ref 11–59)

## 2010-12-21 LAB — LEVETIRACETAM LEVEL

## 2010-12-21 LAB — MAGNESIUM: Magnesium: 1.6 mg/dL (ref 1.5–2.5)

## 2010-12-21 LAB — ETHANOL
Alcohol, Ethyl (B): <5 mg/dL (ref 0–10)
Alcohol, Ethyl (B): <5 mg/dL (ref 0–10)

## 2010-12-21 LAB — GLUCOSE, CAPILLARY: Glucose-Capillary: 99 mg/dL (ref 70–99)

## 2010-12-21 LAB — PHOSPHORUS: Phosphorus: 3.5 mg/dL (ref 2.3–4.6)

## 2010-12-21 LAB — MRSA PCR SCREENING: MRSA by PCR: POSITIVE — AB

## 2010-12-21 NOTE — Letter (Signed)
Summary: ENDOSCOPY REPORT  ENDOSCOPY REPORT   Imported By: Roland Earl 12/13/2010 08:46:07  _____________________________________________________________________  External Attachment:    Type:   Image     Comment:   External Document

## 2010-12-21 NOTE — Progress Notes (Signed)
Summary: Long wait for neuro ref  Phone Note Call from Patient Call back at Home Phone [phone]   Caller: Patient Summary of Call: waiting for [place] referral 2-3 wks waiting now, not heard back Initial call taken by: Elgie Collard,  December 17, 2010 10:24 AM  Follow-up for Phone Call        Advised pt. that neuro referral not done.  As per discussion last OV with Dr. Amil Amen, is still adjusting medication.  Pt. verbalized understanding and agreement.  F/U appt. scheduled 01/04/11.   Follow-up by: Sherian Maroon RN,  December 17, 2010 11:37 AM

## 2010-12-22 ENCOUNTER — Ambulatory Visit (INDEPENDENT_AMBULATORY_CARE_PROVIDER_SITE_OTHER): Payer: Medicaid Other | Admitting: Internal Medicine

## 2010-12-22 ENCOUNTER — Encounter: Payer: Self-pay | Admitting: Internal Medicine

## 2010-12-22 DIAGNOSIS — I10 Essential (primary) hypertension: Secondary | ICD-10-CM

## 2010-12-22 LAB — RAPID URINE DRUG SCREEN, HOSP PERFORMED
Amphetamines: NOT DETECTED
Barbiturates: NOT DETECTED
Benzodiazepines: POSITIVE — AB
Cocaine: NOT DETECTED
Opiates: POSITIVE — AB
Tetrahydrocannabinol: NOT DETECTED

## 2010-12-22 LAB — BASIC METABOLIC PANEL
BUN: 1 mg/dL — ABNORMAL LOW (ref 6–23)
BUN: 1 mg/dL — ABNORMAL LOW (ref 6–23)
BUN: 1 mg/dL — ABNORMAL LOW (ref 6–23)
BUN: 2 mg/dL — ABNORMAL LOW (ref 6–23)
BUN: 2 mg/dL — ABNORMAL LOW (ref 6–23)
BUN: 2 mg/dL — ABNORMAL LOW (ref 6–23)
BUN: <1 mg/dL — ABNORMAL LOW (ref 6–23)
BUN: <1 mg/dL — ABNORMAL LOW (ref 6–23)
BUN: <1 mg/dL — ABNORMAL LOW (ref 6–23)
BUN: <1 mg/dL — ABNORMAL LOW (ref 6–23)
BUN: <1 mg/dL — ABNORMAL LOW (ref 6–23)
CO2: 21 mEq/L (ref 19–32)
CO2: 24 mEq/L (ref 19–32)
CO2: 25 mEq/L (ref 19–32)
CO2: 25 mEq/L (ref 19–32)
CO2: 25 mEq/L (ref 19–32)
CO2: 25 mEq/L (ref 19–32)
CO2: 25 mEq/L (ref 19–32)
CO2: 26 mEq/L (ref 19–32)
CO2: 27 mEq/L (ref 19–32)
CO2: 28 mEq/L (ref 19–32)
CO2: 30 mEq/L (ref 19–32)
Calcium: 7.9 mg/dL — ABNORMAL LOW (ref 8.4–10.5)
Calcium: 8 mg/dL — ABNORMAL LOW (ref 8.4–10.5)
Calcium: 8 mg/dL — ABNORMAL LOW (ref 8.4–10.5)
Calcium: 8.2 mg/dL — ABNORMAL LOW (ref 8.4–10.5)
Calcium: 8.2 mg/dL — ABNORMAL LOW (ref 8.4–10.5)
Calcium: 8.2 mg/dL — ABNORMAL LOW (ref 8.4–10.5)
Calcium: 8.2 mg/dL — ABNORMAL LOW (ref 8.4–10.5)
Calcium: 8.2 mg/dL — ABNORMAL LOW (ref 8.4–10.5)
Calcium: 8.3 mg/dL — ABNORMAL LOW (ref 8.4–10.5)
Calcium: 8.3 mg/dL — ABNORMAL LOW (ref 8.4–10.5)
Calcium: 8.3 mg/dL — ABNORMAL LOW (ref 8.4–10.5)
Chloride: 102 mEq/L (ref 96–112)
Chloride: 103 mEq/L (ref 96–112)
Chloride: 104 mEq/L (ref 96–112)
Chloride: 105 mEq/L (ref 96–112)
Chloride: 106 mEq/L (ref 96–112)
Chloride: 107 mEq/L (ref 96–112)
Chloride: 110 mEq/L (ref 96–112)
Chloride: 111 mEq/L (ref 96–112)
Chloride: 111 mEq/L (ref 96–112)
Chloride: 111 mEq/L (ref 96–112)
Chloride: 112 mEq/L (ref 96–112)
Creatinine, Ser: 0.52 mg/dL (ref 0.4–1.5)
Creatinine, Ser: 0.58 mg/dL (ref 0.4–1.5)
Creatinine, Ser: 0.6 mg/dL (ref 0.4–1.5)
Creatinine, Ser: 0.61 mg/dL (ref 0.4–1.5)
Creatinine, Ser: 0.62 mg/dL (ref 0.4–1.5)
Creatinine, Ser: 0.63 mg/dL (ref 0.4–1.5)
Creatinine, Ser: 0.67 mg/dL (ref 0.4–1.5)
Creatinine, Ser: 0.67 mg/dL (ref 0.4–1.5)
Creatinine, Ser: 0.7 mg/dL (ref 0.4–1.5)
Creatinine, Ser: 0.75 mg/dL (ref 0.4–1.5)
Creatinine, Ser: 0.83 mg/dL (ref 0.4–1.5)
GFR calc Af Amer: >60 mL/min (ref >60)
GFR calc Af Amer: >60 mL/min (ref >60)
GFR calc Af Amer: >60 mL/min (ref >60)
GFR calc Af Amer: >60 mL/min (ref >60)
GFR calc Af Amer: >60 mL/min (ref >60)
GFR calc Af Amer: >60 mL/min (ref >60)
GFR calc Af Amer: >60 mL/min (ref >60)
GFR calc Af Amer: >60 mL/min (ref >60)
GFR calc Af Amer: >60 mL/min (ref >60)
GFR calc Af Amer: >60 mL/min (ref >60)
GFR calc Af Amer: >60 mL/min (ref >60)
GFR calc non Af Amer: >60 mL/min (ref >60)
GFR calc non Af Amer: >60 mL/min (ref >60)
GFR calc non Af Amer: >60 mL/min (ref >60)
GFR calc non Af Amer: >60 mL/min (ref >60)
GFR calc non Af Amer: >60 mL/min (ref >60)
GFR calc non Af Amer: >60 mL/min (ref >60)
GFR calc non Af Amer: >60 mL/min (ref >60)
GFR calc non Af Amer: >60 mL/min (ref >60)
GFR calc non Af Amer: >60 mL/min (ref >60)
GFR calc non Af Amer: >60 mL/min (ref >60)
GFR calc non Af Amer: >60 mL/min (ref >60)
Glucose, Bld: 104 mg/dL — ABNORMAL HIGH (ref 70–99)
Glucose, Bld: 73 mg/dL (ref 70–99)
Glucose, Bld: 73 mg/dL (ref 70–99)
Glucose, Bld: 74 mg/dL (ref 70–99)
Glucose, Bld: 75 mg/dL (ref 70–99)
Glucose, Bld: 76 mg/dL (ref 70–99)
Glucose, Bld: 78 mg/dL (ref 70–99)
Glucose, Bld: 86 mg/dL (ref 70–99)
Glucose, Bld: 91 mg/dL (ref 70–99)
Glucose, Bld: 94 mg/dL (ref 70–99)
Glucose, Bld: 97 mg/dL (ref 70–99)
Potassium: 3 mEq/L — ABNORMAL LOW (ref 3.5–5.1)
Potassium: 3.2 mEq/L — ABNORMAL LOW (ref 3.5–5.1)
Potassium: 3.3 mEq/L — ABNORMAL LOW (ref 3.5–5.1)
Potassium: 3.4 mEq/L — ABNORMAL LOW (ref 3.5–5.1)
Potassium: 3.4 mEq/L — ABNORMAL LOW (ref 3.5–5.1)
Potassium: 3.6 mEq/L (ref 3.5–5.1)
Potassium: 3.7 mEq/L (ref 3.5–5.1)
Potassium: 3.7 mEq/L (ref 3.5–5.1)
Potassium: 3.7 mEq/L (ref 3.5–5.1)
Potassium: 3.8 mEq/L (ref 3.5–5.1)
Potassium: 3.9 mEq/L (ref 3.5–5.1)
Sodium: 134 mEq/L — ABNORMAL LOW (ref 135–145)
Sodium: 137 mEq/L (ref 135–145)
Sodium: 138 mEq/L (ref 135–145)
Sodium: 138 mEq/L (ref 135–145)
Sodium: 139 mEq/L (ref 135–145)
Sodium: 140 mEq/L (ref 135–145)
Sodium: 140 mEq/L (ref 135–145)
Sodium: 141 mEq/L (ref 135–145)
Sodium: 141 mEq/L (ref 135–145)
Sodium: 143 mEq/L (ref 135–145)
Sodium: 143 mEq/L (ref 135–145)

## 2010-12-22 LAB — CBC
HCT: 23.9 % — ABNORMAL LOW (ref 39.0–52.0)
HCT: 24.7 % — ABNORMAL LOW (ref 39.0–52.0)
HCT: 25.1 % — ABNORMAL LOW (ref 39.0–52.0)
HCT: 25.1 % — ABNORMAL LOW (ref 39.0–52.0)
HCT: 25.2 % — ABNORMAL LOW (ref 39.0–52.0)
HCT: 26.1 % — ABNORMAL LOW (ref 39.0–52.0)
HCT: 26.3 % — ABNORMAL LOW (ref 39.0–52.0)
HCT: 27.4 % — ABNORMAL LOW (ref 39.0–52.0)
HCT: 27.6 % — ABNORMAL LOW (ref 39.0–52.0)
HCT: 28.2 % — ABNORMAL LOW (ref 39.0–52.0)
HCT: 30.8 % — ABNORMAL LOW (ref 39.0–52.0)
HCT: 31.3 % — ABNORMAL LOW (ref 39.0–52.0)
HCT: 32.5 % — ABNORMAL LOW (ref 39.0–52.0)
Hemoglobin: 7.4 g/dL — ABNORMAL LOW (ref 13.0–17.0)
Hemoglobin: 7.5 g/dL — ABNORMAL LOW (ref 13.0–17.0)
Hemoglobin: 7.5 g/dL — ABNORMAL LOW (ref 13.0–17.0)
Hemoglobin: 7.6 g/dL — ABNORMAL LOW (ref 13.0–17.0)
Hemoglobin: 7.6 g/dL — ABNORMAL LOW (ref 13.0–17.0)
Hemoglobin: 7.7 g/dL — ABNORMAL LOW (ref 13.0–17.0)
Hemoglobin: 7.9 g/dL — ABNORMAL LOW (ref 13.0–17.0)
Hemoglobin: 8.3 g/dL — ABNORMAL LOW (ref 13.0–17.0)
Hemoglobin: 8.3 g/dL — ABNORMAL LOW (ref 13.0–17.0)
Hemoglobin: 8.5 g/dL — ABNORMAL LOW (ref 13.0–17.0)
Hemoglobin: 9.2 g/dL — ABNORMAL LOW (ref 13.0–17.0)
Hemoglobin: 9.6 g/dL — ABNORMAL LOW (ref 13.0–17.0)
Hemoglobin: 9.9 g/dL — ABNORMAL LOW (ref 13.0–17.0)
MCH: 25.3 pg — ABNORMAL LOW (ref 26.0–34.0)
MCH: 25.6 pg — ABNORMAL LOW (ref 26.0–34.0)
MCH: 25.6 pg — ABNORMAL LOW (ref 26.0–34.0)
MCH: 25.8 pg — ABNORMAL LOW (ref 26.0–34.0)
MCH: 25.8 pg — ABNORMAL LOW (ref 26.0–34.0)
MCH: 26.1 pg (ref 26.0–34.0)
MCH: 26.1 pg (ref 26.0–34.0)
MCH: 26.3 pg (ref 26.0–34.0)
MCH: 26.4 pg (ref 26.0–34.0)
MCH: 26.4 pg (ref 26.0–34.0)
MCH: 26.7 pg (ref 26.0–34.0)
MCH: 26.8 pg (ref 26.0–34.0)
MCH: 26.8 pg (ref 26.0–34.0)
MCHC: 29.5 g/dL — ABNORMAL LOW (ref 30.0–36.0)
MCHC: 29.9 g/dL — ABNORMAL LOW (ref 30.0–36.0)
MCHC: 29.9 g/dL — ABNORMAL LOW (ref 30.0–36.0)
MCHC: 29.9 g/dL — ABNORMAL LOW (ref 30.0–36.0)
MCHC: 30 g/dL (ref 30.0–36.0)
MCHC: 30.1 g/dL (ref 30.0–36.0)
MCHC: 30.1 g/dL (ref 30.0–36.0)
MCHC: 30.2 g/dL (ref 30.0–36.0)
MCHC: 30.3 g/dL (ref 30.0–36.0)
MCHC: 30.5 g/dL (ref 30.0–36.0)
MCHC: 30.7 g/dL (ref 30.0–36.0)
MCHC: 30.8 g/dL (ref 30.0–36.0)
MCHC: 31 g/dL (ref 30.0–36.0)
MCV: 84.8 fL (ref 78.0–100.0)
MCV: 85.2 fL (ref 78.0–100.0)
MCV: 85.7 fL (ref 78.0–100.0)
MCV: 86.2 fL (ref 78.0–100.0)
MCV: 86.3 fL (ref 78.0–100.0)
MCV: 86.6 fL (ref 78.0–100.0)
MCV: 86.7 fL (ref 78.0–100.0)
MCV: 86.8 fL (ref 78.0–100.0)
MCV: 87 fL (ref 78.0–100.0)
MCV: 87.2 fL (ref 78.0–100.0)
MCV: 87.3 fL (ref 78.0–100.0)
MCV: 87.5 fL (ref 78.0–100.0)
MCV: 87.6 fL (ref 78.0–100.0)
Platelets: 363 10*3/uL (ref 150–400)
Platelets: 408 10*3/uL — ABNORMAL HIGH (ref 150–400)
Platelets: 429 10*3/uL — ABNORMAL HIGH (ref 150–400)
Platelets: 442 10*3/uL — ABNORMAL HIGH (ref 150–400)
Platelets: 444 10*3/uL — ABNORMAL HIGH (ref 150–400)
Platelets: 444 10*3/uL — ABNORMAL HIGH (ref 150–400)
Platelets: 450 10*3/uL — ABNORMAL HIGH (ref 150–400)
Platelets: 463 10*3/uL — ABNORMAL HIGH (ref 150–400)
Platelets: 520 10*3/uL — ABNORMAL HIGH (ref 150–400)
Platelets: 527 10*3/uL — ABNORMAL HIGH (ref 150–400)
Platelets: 536 10*3/uL — ABNORMAL HIGH (ref 150–400)
Platelets: 582 10*3/uL — ABNORMAL HIGH (ref 150–400)
Platelets: 589 10*3/uL — ABNORMAL HIGH (ref 150–400)
RBC: 2.76 MIL/uL — ABNORMAL LOW (ref 4.22–5.81)
RBC: 2.84 MIL/uL — ABNORMAL LOW (ref 4.22–5.81)
RBC: 2.89 MIL/uL — ABNORMAL LOW (ref 4.22–5.81)
RBC: 2.91 MIL/uL — ABNORMAL LOW (ref 4.22–5.81)
RBC: 2.96 MIL/uL — ABNORMAL LOW (ref 4.22–5.81)
RBC: 3.01 MIL/uL — ABNORMAL LOW (ref 4.22–5.81)
RBC: 3.03 MIL/uL — ABNORMAL LOW (ref 4.22–5.81)
RBC: 3.14 MIL/uL — ABNORMAL LOW (ref 4.22–5.81)
RBC: 3.24 MIL/uL — ABNORMAL LOW (ref 4.22–5.81)
RBC: 3.29 MIL/uL — ABNORMAL LOW (ref 4.22–5.81)
RBC: 3.52 MIL/uL — ABNORMAL LOW (ref 4.22–5.81)
RBC: 3.63 MIL/uL — ABNORMAL LOW (ref 4.22–5.81)
RBC: 3.71 MIL/uL — ABNORMAL LOW (ref 4.22–5.81)
RDW: 17.8 % — ABNORMAL HIGH (ref 11.5–15.5)
RDW: 18 % — ABNORMAL HIGH (ref 11.5–15.5)
RDW: 18 % — ABNORMAL HIGH (ref 11.5–15.5)
RDW: 18.1 % — ABNORMAL HIGH (ref 11.5–15.5)
RDW: 18.1 % — ABNORMAL HIGH (ref 11.5–15.5)
RDW: 18.2 % — ABNORMAL HIGH (ref 11.5–15.5)
RDW: 18.4 % — ABNORMAL HIGH (ref 11.5–15.5)
RDW: 18.4 % — ABNORMAL HIGH (ref 11.5–15.5)
RDW: 18.4 % — ABNORMAL HIGH (ref 11.5–15.5)
RDW: 18.5 % — ABNORMAL HIGH (ref 11.5–15.5)
RDW: 18.6 % — ABNORMAL HIGH (ref 11.5–15.5)
RDW: 18.7 % — ABNORMAL HIGH (ref 11.5–15.5)
RDW: 18.9 % — ABNORMAL HIGH (ref 11.5–15.5)
WBC: 10.3 10*3/uL (ref 4.0–10.5)
WBC: 10.7 10*3/uL — ABNORMAL HIGH (ref 4.0–10.5)
WBC: 11.3 10*3/uL — ABNORMAL HIGH (ref 4.0–10.5)
WBC: 13.1 10*3/uL — ABNORMAL HIGH (ref 4.0–10.5)
WBC: 14.4 10*3/uL — ABNORMAL HIGH (ref 4.0–10.5)
WBC: 15.1 10*3/uL — ABNORMAL HIGH (ref 4.0–10.5)
WBC: 7.5 10*3/uL (ref 4.0–10.5)
WBC: 8 10*3/uL (ref 4.0–10.5)
WBC: 8.2 10*3/uL (ref 4.0–10.5)
WBC: 8.8 10*3/uL (ref 4.0–10.5)
WBC: 9 10*3/uL (ref 4.0–10.5)
WBC: 9.1 10*3/uL (ref 4.0–10.5)
WBC: 9.9 10*3/uL (ref 4.0–10.5)

## 2010-12-22 LAB — CULTURE, ROUTINE-ABSCESS

## 2010-12-22 LAB — URINE MICROSCOPIC-ADD ON

## 2010-12-22 LAB — COMPREHENSIVE METABOLIC PANEL
ALT: 11 U/L (ref 0–53)
ALT: <8 U/L (ref 0–53)
AST: 24 U/L (ref 0–37)
AST: 8 U/L (ref 0–37)
Albumin: 1.7 g/dL — ABNORMAL LOW (ref 3.5–5.2)
Albumin: 2.5 g/dL — ABNORMAL LOW (ref 3.5–5.2)
Alkaline Phosphatase: 42 U/L (ref 39–117)
Alkaline Phosphatase: 75 U/L (ref 39–117)
BUN: 3 mg/dL — ABNORMAL LOW (ref 6–23)
BUN: 4 mg/dL — ABNORMAL LOW (ref 6–23)
CO2: 23 mEq/L (ref 19–32)
CO2: 25 mEq/L (ref 19–32)
Calcium: 8.1 mg/dL — ABNORMAL LOW (ref 8.4–10.5)
Calcium: 8.8 mg/dL (ref 8.4–10.5)
Chloride: 102 mEq/L (ref 96–112)
Chloride: 106 mEq/L (ref 96–112)
Creatinine, Ser: 0.67 mg/dL (ref 0.4–1.5)
Creatinine, Ser: 0.85 mg/dL (ref 0.4–1.5)
GFR calc Af Amer: >60 mL/min (ref >60)
GFR calc Af Amer: >60 mL/min (ref >60)
GFR calc non Af Amer: >60 mL/min (ref >60)
GFR calc non Af Amer: >60 mL/min (ref >60)
Glucose, Bld: 68 mg/dL — ABNORMAL LOW (ref 70–99)
Glucose, Bld: 96 mg/dL (ref 70–99)
Potassium: 3.8 mEq/L (ref 3.5–5.1)
Potassium: 3.9 mEq/L (ref 3.5–5.1)
Sodium: 139 mEq/L (ref 135–145)
Sodium: 141 mEq/L (ref 135–145)
Total Bilirubin: 0.7 mg/dL (ref 0.3–1.2)
Total Bilirubin: 0.9 mg/dL (ref 0.3–1.2)
Total Protein: 4.6 g/dL — ABNORMAL LOW (ref 6.0–8.3)
Total Protein: 6.6 g/dL (ref 6.0–8.3)

## 2010-12-22 LAB — LIPID PANEL
Cholesterol: 75 mg/dL (ref 0–200)
HDL: 11 mg/dL — ABNORMAL LOW (ref >39)
LDL Cholesterol: 52 mg/dL (ref 0–99)
Total CHOL/HDL Ratio: 6.8 RATIO
Triglycerides: 61 mg/dL (ref <150)
VLDL: 12 mg/dL (ref 0–40)

## 2010-12-22 LAB — PROTIME-INR
INR: 1.14 (ref 0.00–1.49)
Prothrombin Time: 14.8 seconds (ref 11.6–15.2)

## 2010-12-22 LAB — FOLATE: Folate: >20 ng/mL

## 2010-12-22 LAB — CARDIAC PANEL(CRET KIN+CKTOT+MB+TROPI)
CK, MB: 0.6 ng/mL (ref 0.3–4.0)
CK, MB: 1 ng/mL (ref 0.3–4.0)
CK, MB: 1.1 ng/mL (ref 0.3–4.0)
CK, MB: 1.1 ng/mL (ref 0.3–4.0)
Relative Index: 0.8 (ref 0.0–2.5)
Relative Index: 0.9 (ref 0.0–2.5)
Relative Index: 0.9 (ref 0.0–2.5)
Relative Index: INVALID (ref 0.0–2.5)
Total CK: 122 U/L (ref 7–232)
Total CK: 124 U/L (ref 7–232)
Total CK: 125 U/L (ref 7–232)
Total CK: 27 U/L (ref 7–232)
Troponin I: 0.01 ng/mL (ref 0.00–0.06)
Troponin I: 0.01 ng/mL (ref 0.00–0.06)
Troponin I: 0.01 ng/mL (ref 0.00–0.06)
Troponin I: <0.01 ng/mL (ref 0.00–0.06)

## 2010-12-22 LAB — LIPASE, FLUID: Lipase-Fluid: 43 U/L

## 2010-12-22 LAB — URINALYSIS, ROUTINE W REFLEX MICROSCOPIC
Bilirubin Urine: NEGATIVE
Glucose, UA: NEGATIVE mg/dL
Hgb urine dipstick: NEGATIVE
Ketones, ur: NEGATIVE mg/dL
Leukocytes, UA: NEGATIVE
Nitrite: NEGATIVE
Protein, ur: 30 mg/dL — AB
Specific Gravity, Urine: 1.019 (ref 1.005–1.030)
Urobilinogen, UA: 1 mg/dL (ref 0.0–1.0)
pH: 6.5 (ref 5.0–8.0)

## 2010-12-22 LAB — DIFFERENTIAL
Basophils Absolute: 0 10*3/uL (ref 0.0–0.1)
Basophils Absolute: 0 10*3/uL (ref 0.0–0.1)
Basophils Absolute: 0 10*3/uL (ref 0.0–0.1)
Basophils Relative: 0 % (ref 0–1)
Basophils Relative: 0 % (ref 0–1)
Basophils Relative: 0 % (ref 0–1)
Eosinophils Absolute: 0 10*3/uL (ref 0.0–0.7)
Eosinophils Absolute: 0 10*3/uL (ref 0.0–0.7)
Eosinophils Absolute: 0 10*3/uL (ref 0.0–0.7)
Eosinophils Relative: 0 % (ref 0–5)
Eosinophils Relative: 0 % (ref 0–5)
Eosinophils Relative: 0 % (ref 0–5)
Lymphocytes Relative: 7 % — ABNORMAL LOW (ref 12–46)
Lymphocytes Relative: 8 % — ABNORMAL LOW (ref 12–46)
Lymphocytes Relative: 8 % — ABNORMAL LOW (ref 12–46)
Lymphs Abs: 0.9 10*3/uL (ref 0.7–4.0)
Lymphs Abs: 0.9 10*3/uL (ref 0.7–4.0)
Lymphs Abs: 1.3 10*3/uL (ref 0.7–4.0)
Monocytes Absolute: 0.7 10*3/uL (ref 0.1–1.0)
Monocytes Absolute: 0.7 10*3/uL (ref 0.1–1.0)
Monocytes Absolute: 1.2 10*3/uL — ABNORMAL HIGH (ref 0.1–1.0)
Monocytes Relative: 5 % (ref 3–12)
Monocytes Relative: 7 % (ref 3–12)
Monocytes Relative: 8 % (ref 3–12)
Neutro Abs: 11.5 10*3/uL — ABNORMAL HIGH (ref 1.7–7.7)
Neutro Abs: 12.7 10*3/uL — ABNORMAL HIGH (ref 1.7–7.7)
Neutro Abs: 9.7 10*3/uL — ABNORMAL HIGH (ref 1.7–7.7)
Neutrophils Relative %: 84 % — ABNORMAL HIGH (ref 43–77)
Neutrophils Relative %: 85 % — ABNORMAL HIGH (ref 43–77)
Neutrophils Relative %: 88 % — ABNORMAL HIGH (ref 43–77)

## 2010-12-22 LAB — TSH: TSH: 1.689 u[IU]/mL (ref 0.350–4.500)

## 2010-12-22 LAB — FUNGUS CULTURE W SMEAR: Fungal Smear: NONE SEEN

## 2010-12-22 LAB — BODY FLUID CULTURE

## 2010-12-22 LAB — AMYLASE, BODY FLUID: Amylase, Fluid: 170 U/L

## 2010-12-22 LAB — WOUND CULTURE: Gram Stain: NONE SEEN

## 2010-12-22 LAB — RETICULOCYTES
RBC.: 3.45 MIL/uL — ABNORMAL LOW (ref 4.22–5.81)
Retic Count, Absolute: 79.4 10*3/uL (ref 19.0–186.0)
Retic Ct Pct: 2.3 % (ref 0.4–3.1)

## 2010-12-22 LAB — BODY FLUID CELL COUNT WITH DIFFERENTIAL
Lymphs, Fluid: 2 %
Monocyte-Macrophage-Serous Fluid: 85 % (ref 50–90)
Neutrophil Count, Fluid: 13 % (ref 0–25)
Total Nucleated Cell Count, Fluid: 435 cu mm (ref 0–1000)

## 2010-12-22 LAB — LACTIC ACID, PLASMA
Lactic Acid, Venous: 1.5 mmol/L (ref 0.5–2.2)
Lactic Acid, Venous: 7.6 mmol/L — ABNORMAL HIGH (ref 0.5–2.2)

## 2010-12-22 LAB — MAGNESIUM
Magnesium: 1.5 mg/dL (ref 1.5–2.5)
Magnesium: 1.7 mg/dL (ref 1.5–2.5)
Magnesium: 1.8 mg/dL (ref 1.5–2.5)

## 2010-12-22 LAB — CULTURE, BLOOD (ROUTINE X 2): Culture  Setup Time: 201110210142

## 2010-12-22 LAB — URINE CULTURE: Culture  Setup Time: 201110201449

## 2010-12-22 LAB — AMMONIA
Ammonia: 23 umol/L (ref 11–35)
Ammonia: 64 umol/L — ABNORMAL HIGH (ref 11–35)

## 2010-12-22 LAB — LIPASE, BLOOD
Lipase: 110 U/L — ABNORMAL HIGH (ref 11–59)
Lipase: 244 U/L — ABNORMAL HIGH (ref 11–59)
Lipase: 328 U/L — ABNORMAL HIGH (ref 11–59)

## 2010-12-22 LAB — CK: Total CK: 106 U/L (ref 7–232)

## 2010-12-22 LAB — MRSA PCR SCREENING: MRSA by PCR: POSITIVE — AB

## 2010-12-22 LAB — T4, FREE: Free T4: 1.25 ng/dL (ref 0.80–1.80)

## 2010-12-22 LAB — IRON AND TIBC
Iron: 27 ug/dL — ABNORMAL LOW (ref 42–135)
Saturation Ratios: 21 % (ref 20–55)
TIBC: 129 ug/dL — ABNORMAL LOW (ref 215–435)
UIBC: 102 ug/dL

## 2010-12-22 LAB — AFP TUMOR MARKER: AFP-Tumor Marker: 1.5 ng/mL (ref 0.0–8.0)

## 2010-12-22 LAB — APTT: aPTT: 25 seconds (ref 24–37)

## 2010-12-22 LAB — VITAMIN B12: Vitamin B-12: 621 pg/mL (ref 211–911)

## 2010-12-22 LAB — FERRITIN: Ferritin: 862 ng/mL — ABNORMAL HIGH (ref 22–322)

## 2010-12-23 LAB — DIFFERENTIAL
Basophils Absolute: 0 10*3/uL (ref 0.0–0.1)
Basophils Relative: 0 % (ref 0–1)
Eosinophils Absolute: 0.2 10*3/uL (ref 0.0–0.7)
Eosinophils Relative: 3 % (ref 0–5)
Lymphocytes Relative: 18 % (ref 12–46)
Lymphs Abs: 1.6 10*3/uL (ref 0.7–4.0)
Monocytes Absolute: 1 10*3/uL (ref 0.1–1.0)
Monocytes Relative: 11 % (ref 3–12)
Neutro Abs: 6.2 10*3/uL (ref 1.7–7.7)
Neutrophils Relative %: 68 % (ref 43–77)

## 2010-12-23 LAB — COMPREHENSIVE METABOLIC PANEL
ALT: 40 U/L (ref 0–53)
AST: 30 U/L (ref 0–37)
Albumin: 3 g/dL — ABNORMAL LOW (ref 3.5–5.2)
Alkaline Phosphatase: 81 U/L (ref 39–117)
BUN: 8 mg/dL (ref 6–23)
CO2: 28 mEq/L (ref 19–32)
Calcium: 9 mg/dL (ref 8.4–10.5)
Chloride: 107 mEq/L (ref 96–112)
Creatinine, Ser: 0.86 mg/dL (ref 0.4–1.5)
GFR calc Af Amer: >60 mL/min (ref >60)
GFR calc non Af Amer: >60 mL/min (ref >60)
Glucose, Bld: 98 mg/dL (ref 70–99)
Potassium: 4 mEq/L (ref 3.5–5.1)
Sodium: 143 mEq/L (ref 135–145)
Total Bilirubin: 0.5 mg/dL (ref 0.3–1.2)
Total Protein: 7 g/dL (ref 6.0–8.3)

## 2010-12-23 LAB — CBC
HCT: 30.2 % — ABNORMAL LOW (ref 39.0–52.0)
Hemoglobin: 8.9 g/dL — ABNORMAL LOW (ref 13.0–17.0)
MCH: 25.9 pg — ABNORMAL LOW (ref 26.0–34.0)
MCHC: 29.5 g/dL — ABNORMAL LOW (ref 30.0–36.0)
MCV: 87.8 fL (ref 78.0–100.0)
Platelets: 461 10*3/uL — ABNORMAL HIGH (ref 150–400)
RBC: 3.44 MIL/uL — ABNORMAL LOW (ref 4.22–5.81)
RDW: 16 % — ABNORMAL HIGH (ref 11.5–15.5)
WBC: 9.1 10*3/uL (ref 4.0–10.5)

## 2010-12-23 LAB — POCT CARDIAC MARKERS
CKMB, poc: <1 ng/mL — ABNORMAL LOW (ref 1.0–8.0)
Myoglobin, poc: 79.9 ng/mL (ref 12–200)
Troponin i, poc: <0.05 ng/mL (ref 0.00–0.09)

## 2010-12-23 LAB — ETHANOL: Alcohol, Ethyl (B): <5 mg/dL (ref 0–10)

## 2010-12-23 LAB — LIPASE, BLOOD: Lipase: 391 U/L — ABNORMAL HIGH (ref 11–59)

## 2010-12-24 LAB — CBC
HCT: 30.7 % — ABNORMAL LOW (ref 39.0–52.0)
HCT: 31.7 % — ABNORMAL LOW (ref 39.0–52.0)
HCT: 32.2 % — ABNORMAL LOW (ref 39.0–52.0)
HCT: 35.4 % — ABNORMAL LOW (ref 39.0–52.0)
HCT: 40.9 % (ref 39.0–52.0)
Hemoglobin: 10 g/dL — ABNORMAL LOW (ref 13.0–17.0)
Hemoglobin: 10.9 g/dL — ABNORMAL LOW (ref 13.0–17.0)
Hemoglobin: 13.1 g/dL (ref 13.0–17.0)
Hemoglobin: 9.4 g/dL — ABNORMAL LOW (ref 13.0–17.0)
Hemoglobin: 9.6 g/dL — ABNORMAL LOW (ref 13.0–17.0)
MCH: 28.1 pg (ref 26.0–34.0)
MCH: 28.2 pg (ref 26.0–34.0)
MCH: 28.3 pg (ref 26.0–34.0)
MCH: 28.9 pg (ref 26.0–34.0)
MCH: 29 pg (ref 26.0–34.0)
MCHC: 30.3 g/dL (ref 30.0–36.0)
MCHC: 30.6 g/dL (ref 30.0–36.0)
MCHC: 30.8 g/dL (ref 30.0–36.0)
MCHC: 31.1 g/dL (ref 30.0–36.0)
MCHC: 32.1 g/dL (ref 30.0–36.0)
MCV: 90.3 fL (ref 78.0–100.0)
MCV: 90.4 fL (ref 78.0–100.0)
MCV: 92.5 fL (ref 78.0–100.0)
MCV: 93 fL (ref 78.0–100.0)
MCV: 93.9 fL (ref 78.0–100.0)
Platelets: 326 10*3/uL (ref 150–400)
Platelets: 326 10*3/uL (ref 150–400)
Platelets: 330 10*3/uL (ref 150–400)
Platelets: 344 10*3/uL (ref 150–400)
Platelets: 345 10*3/uL (ref 150–400)
RBC: 3.32 MIL/uL — ABNORMAL LOW (ref 4.22–5.81)
RBC: 3.41 MIL/uL — ABNORMAL LOW (ref 4.22–5.81)
RBC: 3.56 MIL/uL — ABNORMAL LOW (ref 4.22–5.81)
RBC: 3.77 MIL/uL — ABNORMAL LOW (ref 4.22–5.81)
RBC: 4.53 MIL/uL (ref 4.22–5.81)
RDW: 15.5 % (ref 11.5–15.5)
RDW: 15.5 % (ref 11.5–15.5)
RDW: 15.6 % — ABNORMAL HIGH (ref 11.5–15.5)
RDW: 15.9 % — ABNORMAL HIGH (ref 11.5–15.5)
RDW: 16.6 % — ABNORMAL HIGH (ref 11.5–15.5)
WBC: 6.5 10*3/uL (ref 4.0–10.5)
WBC: 8.2 10*3/uL (ref 4.0–10.5)
WBC: 8.7 10*3/uL (ref 4.0–10.5)
WBC: 9 10*3/uL (ref 4.0–10.5)
WBC: 9.4 10*3/uL (ref 4.0–10.5)

## 2010-12-24 LAB — POCT CARDIAC MARKERS
CKMB, poc: <1 ng/mL — ABNORMAL LOW (ref 1.0–8.0)
CKMB, poc: <1 ng/mL — ABNORMAL LOW (ref 1.0–8.0)
CKMB, poc: <1 ng/mL — ABNORMAL LOW (ref 1.0–8.0)
CKMB, poc: <1 ng/mL — ABNORMAL LOW (ref 1.0–8.0)
Myoglobin, poc: 33 ng/mL (ref 12–200)
Myoglobin, poc: 35.3 ng/mL (ref 12–200)
Myoglobin, poc: 38.8 ng/mL (ref 12–200)
Myoglobin, poc: 50.6 ng/mL (ref 12–200)
Troponin i, poc: <0.05 ng/mL (ref 0.00–0.09)
Troponin i, poc: <0.05 ng/mL (ref 0.00–0.09)
Troponin i, poc: <0.05 ng/mL (ref 0.00–0.09)
Troponin i, poc: <0.05 ng/mL (ref 0.00–0.09)

## 2010-12-24 LAB — COMPREHENSIVE METABOLIC PANEL
ALT: 12 U/L (ref 0–53)
ALT: 15 U/L (ref 0–53)
ALT: 23 U/L (ref 0–53)
AST: 18 U/L (ref 0–37)
AST: 19 U/L (ref 0–37)
AST: 27 U/L (ref 0–37)
Albumin: 2.6 g/dL — ABNORMAL LOW (ref 3.5–5.2)
Albumin: 2.8 g/dL — ABNORMAL LOW (ref 3.5–5.2)
Albumin: 3.4 g/dL — ABNORMAL LOW (ref 3.5–5.2)
Alkaline Phosphatase: 55 U/L (ref 39–117)
Alkaline Phosphatase: 62 U/L (ref 39–117)
Alkaline Phosphatase: 71 U/L (ref 39–117)
BUN: 13 mg/dL (ref 6–23)
BUN: 16 mg/dL (ref 6–23)
BUN: 6 mg/dL (ref 6–23)
CO2: 26 mEq/L (ref 19–32)
CO2: 28 mEq/L (ref 19–32)
CO2: 29 mEq/L (ref 19–32)
Calcium: 8.5 mg/dL (ref 8.4–10.5)
Calcium: 8.7 mg/dL (ref 8.4–10.5)
Calcium: 9.7 mg/dL (ref 8.4–10.5)
Chloride: 102 mEq/L (ref 96–112)
Chloride: 103 mEq/L (ref 96–112)
Chloride: 95 mEq/L — ABNORMAL LOW (ref 96–112)
Creatinine, Ser: 0.73 mg/dL (ref 0.4–1.5)
Creatinine, Ser: 0.84 mg/dL (ref 0.4–1.5)
Creatinine, Ser: 1.02 mg/dL (ref 0.4–1.5)
GFR calc Af Amer: >60 mL/min (ref >60)
GFR calc Af Amer: >60 mL/min (ref >60)
GFR calc Af Amer: >60 mL/min (ref >60)
GFR calc non Af Amer: >60 mL/min (ref >60)
GFR calc non Af Amer: >60 mL/min (ref >60)
GFR calc non Af Amer: >60 mL/min (ref >60)
Glucose, Bld: 164 mg/dL — ABNORMAL HIGH (ref 70–99)
Glucose, Bld: 90 mg/dL (ref 70–99)
Glucose, Bld: 94 mg/dL (ref 70–99)
Potassium: 3.5 mEq/L (ref 3.5–5.1)
Potassium: 3.7 mEq/L (ref 3.5–5.1)
Potassium: 3.9 mEq/L (ref 3.5–5.1)
Sodium: 133 mEq/L — ABNORMAL LOW (ref 135–145)
Sodium: 135 mEq/L (ref 135–145)
Sodium: 141 mEq/L (ref 135–145)
Total Bilirubin: 0.4 mg/dL (ref 0.3–1.2)
Total Bilirubin: 0.5 mg/dL (ref 0.3–1.2)
Total Bilirubin: 0.6 mg/dL (ref 0.3–1.2)
Total Protein: 6 g/dL (ref 6.0–8.3)
Total Protein: 6.9 g/dL (ref 6.0–8.3)
Total Protein: 8.2 g/dL (ref 6.0–8.3)

## 2010-12-24 LAB — BASIC METABOLIC PANEL
BUN: 5 mg/dL — ABNORMAL LOW (ref 6–23)
BUN: 6 mg/dL (ref 6–23)
CO2: 26 mEq/L (ref 19–32)
CO2: 30 mEq/L (ref 19–32)
Calcium: 8.3 mg/dL — ABNORMAL LOW (ref 8.4–10.5)
Calcium: 8.9 mg/dL (ref 8.4–10.5)
Chloride: 100 mEq/L (ref 96–112)
Chloride: 107 mEq/L (ref 96–112)
Creatinine, Ser: 0.62 mg/dL (ref 0.4–1.5)
Creatinine, Ser: 0.71 mg/dL (ref 0.4–1.5)
GFR calc Af Amer: >60 mL/min (ref >60)
GFR calc Af Amer: >60 mL/min (ref >60)
GFR calc non Af Amer: >60 mL/min (ref >60)
GFR calc non Af Amer: >60 mL/min (ref >60)
Glucose, Bld: 68 mg/dL — ABNORMAL LOW (ref 70–99)
Glucose, Bld: 84 mg/dL (ref 70–99)
Potassium: 3.4 mEq/L — ABNORMAL LOW (ref 3.5–5.1)
Potassium: 3.6 mEq/L (ref 3.5–5.1)
Sodium: 141 mEq/L (ref 135–145)
Sodium: 142 mEq/L (ref 135–145)

## 2010-12-24 LAB — CULTURE, BLOOD (ROUTINE X 2): Culture: NO GROWTH

## 2010-12-24 LAB — DIFFERENTIAL
Basophils Absolute: 0 10*3/uL (ref 0.0–0.1)
Basophils Absolute: 0 10*3/uL (ref 0.0–0.1)
Basophils Absolute: 0 10*3/uL (ref 0.0–0.1)
Basophils Absolute: 0 10*3/uL (ref 0.0–0.1)
Basophils Relative: 0 % (ref 0–1)
Basophils Relative: 0 % (ref 0–1)
Basophils Relative: 0 % (ref 0–1)
Basophils Relative: 0 % (ref 0–1)
Eosinophils Absolute: 0.2 10*3/uL (ref 0.0–0.7)
Eosinophils Absolute: 0.2 10*3/uL (ref 0.0–0.7)
Eosinophils Absolute: 0.3 10*3/uL (ref 0.0–0.7)
Eosinophils Absolute: 0.3 10*3/uL (ref 0.0–0.7)
Eosinophils Relative: 2 % (ref 0–5)
Eosinophils Relative: 3 % (ref 0–5)
Eosinophils Relative: 3 % (ref 0–5)
Eosinophils Relative: 3 % (ref 0–5)
Lymphocytes Relative: 15 % (ref 12–46)
Lymphocytes Relative: 17 % (ref 12–46)
Lymphocytes Relative: 18 % (ref 12–46)
Lymphocytes Relative: 21 % (ref 12–46)
Lymphs Abs: 1.3 10*3/uL (ref 0.7–4.0)
Lymphs Abs: 1.6 10*3/uL (ref 0.7–4.0)
Lymphs Abs: 1.7 10*3/uL (ref 0.7–4.0)
Lymphs Abs: 1.7 10*3/uL (ref 0.7–4.0)
Monocytes Absolute: 0.5 10*3/uL (ref 0.1–1.0)
Monocytes Absolute: 0.9 10*3/uL (ref 0.1–1.0)
Monocytes Absolute: 0.9 10*3/uL (ref 0.1–1.0)
Monocytes Absolute: 1 10*3/uL (ref 0.1–1.0)
Monocytes Relative: 10 % (ref 3–12)
Monocytes Relative: 11 % (ref 3–12)
Monocytes Relative: 6 % (ref 3–12)
Monocytes Relative: 9 % (ref 3–12)
Neutro Abs: 5.5 10*3/uL (ref 1.7–7.7)
Neutro Abs: 6.2 10*3/uL (ref 1.7–7.7)
Neutro Abs: 6.6 10*3/uL (ref 1.7–7.7)
Neutro Abs: 6.6 10*3/uL (ref 1.7–7.7)
Neutrophils Relative %: 66 % (ref 43–77)
Neutrophils Relative %: 69 % (ref 43–77)
Neutrophils Relative %: 70 % (ref 43–77)
Neutrophils Relative %: 77 % (ref 43–77)

## 2010-12-24 LAB — GLUCOSE, CAPILLARY
Glucose-Capillary: 101 mg/dL — ABNORMAL HIGH (ref 70–99)
Glucose-Capillary: 124 mg/dL — ABNORMAL HIGH (ref 70–99)
Glucose-Capillary: 125 mg/dL — ABNORMAL HIGH (ref 70–99)
Glucose-Capillary: 127 mg/dL — ABNORMAL HIGH (ref 70–99)
Glucose-Capillary: 135 mg/dL — ABNORMAL HIGH (ref 70–99)
Glucose-Capillary: 142 mg/dL — ABNORMAL HIGH (ref 70–99)
Glucose-Capillary: 75 mg/dL (ref 70–99)
Glucose-Capillary: 86 mg/dL (ref 70–99)
Glucose-Capillary: 88 mg/dL (ref 70–99)
Glucose-Capillary: 89 mg/dL (ref 70–99)

## 2010-12-24 LAB — POCT I-STAT, CHEM 8
BUN: 18 mg/dL (ref 6–23)
Calcium, Ion: 1.11 mmol/L — ABNORMAL LOW (ref 1.12–1.32)
Chloride: 104 mEq/L (ref 96–112)
Creatinine, Ser: 0.8 mg/dL (ref 0.4–1.5)
Glucose, Bld: 127 mg/dL — ABNORMAL HIGH (ref 70–99)
HCT: 39 % (ref 39.0–52.0)
Hemoglobin: 13.3 g/dL (ref 13.0–17.0)
Potassium: 4.2 mEq/L (ref 3.5–5.1)
Sodium: 139 mEq/L (ref 135–145)
TCO2: 27 mmol/L (ref 0–100)

## 2010-12-24 LAB — CARDIAC PANEL(CRET KIN+CKTOT+MB+TROPI)
CK, MB: 0.9 ng/mL (ref 0.3–4.0)
CK, MB: 1.1 ng/mL (ref 0.3–4.0)
CK, MB: 1.2 ng/mL (ref 0.3–4.0)
Relative Index: INVALID (ref 0.0–2.5)
Relative Index: INVALID (ref 0.0–2.5)
Relative Index: INVALID (ref 0.0–2.5)
Total CK: 32 U/L (ref 7–232)
Total CK: 33 U/L (ref 7–232)
Total CK: 41 U/L (ref 7–232)
Troponin I: 0.02 ng/mL (ref 0.00–0.06)
Troponin I: 0.02 ng/mL (ref 0.00–0.06)
Troponin I: 0.04 ng/mL (ref 0.00–0.06)

## 2010-12-24 LAB — HEPATIC FUNCTION PANEL
ALT: 11 U/L (ref 0–53)
ALT: 20 U/L (ref 0–53)
AST: 16 U/L (ref 0–37)
AST: 25 U/L (ref 0–37)
Albumin: 2.6 g/dL — ABNORMAL LOW (ref 3.5–5.2)
Albumin: 3.3 g/dL — ABNORMAL LOW (ref 3.5–5.2)
Alkaline Phosphatase: 47 U/L (ref 39–117)
Alkaline Phosphatase: 72 U/L (ref 39–117)
Bilirubin, Direct: <0.1 mg/dL (ref 0.0–0.3)
Bilirubin, Direct: <0.1 mg/dL (ref 0.0–0.3)
Total Bilirubin: 0.2 mg/dL — ABNORMAL LOW (ref 0.3–1.2)
Total Bilirubin: 0.3 mg/dL (ref 0.3–1.2)
Total Protein: 5.5 g/dL — ABNORMAL LOW (ref 6.0–8.3)
Total Protein: 7.3 g/dL (ref 6.0–8.3)

## 2010-12-24 LAB — LIPASE, BLOOD
Lipase: 134 U/L — ABNORMAL HIGH (ref 11–59)
Lipase: 171 U/L — ABNORMAL HIGH (ref 11–59)
Lipase: 173 U/L — ABNORMAL HIGH (ref 11–59)
Lipase: 186 U/L — ABNORMAL HIGH (ref 11–59)
Lipase: 430 U/L — ABNORMAL HIGH (ref 11–59)
Lipase: 536 U/L — ABNORMAL HIGH (ref 11–59)

## 2010-12-24 LAB — MRSA PCR SCREENING
MRSA by PCR: POSITIVE — AB
MRSA by PCR: POSITIVE — AB

## 2010-12-24 LAB — MAGNESIUM: Magnesium: 1.5 mg/dL (ref 1.5–2.5)

## 2010-12-24 LAB — VALPROIC ACID LEVEL: Valproic Acid Lvl: 35.3 ug/mL — ABNORMAL LOW (ref 50.0–100.0)

## 2010-12-25 LAB — CARDIAC PANEL(CRET KIN+CKTOT+MB+TROPI)
CK, MB: 1.1 ng/mL (ref 0.3–4.0)
CK, MB: 1.3 ng/mL (ref 0.3–4.0)
Relative Index: INVALID (ref 0.0–2.5)
Relative Index: INVALID (ref 0.0–2.5)
Total CK: 38 U/L (ref 7–232)
Total CK: 63 U/L (ref 7–232)
Troponin I: 0.02 ng/mL (ref 0.00–0.06)
Troponin I: 0.02 ng/mL (ref 0.00–0.06)

## 2010-12-25 LAB — POCT CARDIAC MARKERS
CKMB, poc: 1.1 ng/mL (ref 1.0–8.0)
CKMB, poc: <1 ng/mL — ABNORMAL LOW (ref 1.0–8.0)
Myoglobin, poc: 21.2 ng/mL (ref 12–200)
Myoglobin, poc: 37.5 ng/mL (ref 12–200)
Troponin i, poc: <0.05 ng/mL (ref 0.00–0.09)
Troponin i, poc: <0.05 ng/mL (ref 0.00–0.09)

## 2010-12-25 LAB — DIFFERENTIAL
Basophils Absolute: 0 10*3/uL (ref 0.0–0.1)
Basophils Relative: 0 % (ref 0–1)
Eosinophils Absolute: 0.2 10*3/uL (ref 0.0–0.7)
Eosinophils Relative: 3 % (ref 0–5)
Lymphocytes Relative: 29 % (ref 12–46)
Lymphs Abs: 2.2 10*3/uL (ref 0.7–4.0)
Monocytes Absolute: 0.7 10*3/uL (ref 0.1–1.0)
Monocytes Relative: 9 % (ref 3–12)
Neutro Abs: 4.6 10*3/uL (ref 1.7–7.7)
Neutrophils Relative %: 59 % (ref 43–77)

## 2010-12-25 LAB — BASIC METABOLIC PANEL
BUN: 14 mg/dL (ref 6–23)
BUN: 9 mg/dL (ref 6–23)
CO2: 26 mEq/L (ref 19–32)
CO2: 30 mEq/L (ref 19–32)
Calcium: 9 mg/dL (ref 8.4–10.5)
Calcium: 9.1 mg/dL (ref 8.4–10.5)
Chloride: 103 mEq/L (ref 96–112)
Chloride: 103 mEq/L (ref 96–112)
Creatinine, Ser: 0.75 mg/dL (ref 0.4–1.5)
Creatinine, Ser: 0.78 mg/dL (ref 0.4–1.5)
GFR calc Af Amer: >60 mL/min (ref >60)
GFR calc Af Amer: >60 mL/min (ref >60)
GFR calc non Af Amer: >60 mL/min (ref >60)
GFR calc non Af Amer: >60 mL/min (ref >60)
Glucose, Bld: 94 mg/dL (ref 70–99)
Glucose, Bld: 98 mg/dL (ref 70–99)
Potassium: 4.3 mEq/L (ref 3.5–5.1)
Potassium: 4.3 mEq/L (ref 3.5–5.1)
Sodium: 137 mEq/L (ref 135–145)
Sodium: 140 mEq/L (ref 135–145)

## 2010-12-25 LAB — CBC
HCT: 29.4 % — ABNORMAL LOW (ref 39.0–52.0)
HCT: 32.7 % — ABNORMAL LOW (ref 39.0–52.0)
Hemoglobin: 10.4 g/dL — ABNORMAL LOW (ref 13.0–17.0)
Hemoglobin: 9.3 g/dL — ABNORMAL LOW (ref 13.0–17.0)
MCH: 30.3 pg (ref 26.0–34.0)
MCH: 30.3 pg (ref 26.0–34.0)
MCHC: 31.4 g/dL (ref 30.0–36.0)
MCHC: 31.8 g/dL (ref 30.0–36.0)
MCV: 95.3 fL (ref 78.0–100.0)
MCV: 96.4 fL (ref 78.0–100.0)
Platelets: 315 10*3/uL (ref 150–400)
Platelets: 342 10*3/uL (ref 150–400)
RBC: 3.05 MIL/uL — ABNORMAL LOW (ref 4.22–5.81)
RBC: 3.43 MIL/uL — ABNORMAL LOW (ref 4.22–5.81)
RDW: 17.9 % — ABNORMAL HIGH (ref 11.5–15.5)
RDW: 19.3 % — ABNORMAL HIGH (ref 11.5–15.5)
WBC: 7.7 10*3/uL (ref 4.0–10.5)
WBC: 7.8 10*3/uL (ref 4.0–10.5)

## 2010-12-25 LAB — GLUCOSE, CAPILLARY
Glucose-Capillary: 140 mg/dL — ABNORMAL HIGH (ref 70–99)
Glucose-Capillary: 143 mg/dL — ABNORMAL HIGH (ref 70–99)
Glucose-Capillary: 98 mg/dL (ref 70–99)

## 2010-12-25 LAB — CK TOTAL AND CKMB (NOT AT ARMC)
CK, MB: 1.1 ng/mL (ref 0.3–4.0)
Relative Index: INVALID (ref 0.0–2.5)
Total CK: 36 U/L (ref 7–232)

## 2010-12-25 LAB — PROTIME-INR
INR: 1.01 (ref 0.00–1.49)
Prothrombin Time: 13.2 seconds (ref 11.6–15.2)

## 2010-12-25 LAB — TROPONIN I: Troponin I: 0.02 ng/mL (ref 0.00–0.06)

## 2010-12-25 LAB — APTT: aPTT: 28 seconds (ref 24–37)

## 2010-12-25 LAB — MRSA PCR SCREENING: MRSA by PCR: POSITIVE — AB

## 2010-12-26 LAB — CBC
HCT: 25.3 % — ABNORMAL LOW (ref 39.0–52.0)
HCT: 28.6 % — ABNORMAL LOW (ref 39.0–52.0)
HCT: 30.9 % — ABNORMAL LOW (ref 39.0–52.0)
HCT: 31.1 % — ABNORMAL LOW (ref 39.0–52.0)
HCT: 31.5 % — ABNORMAL LOW (ref 39.0–52.0)
HCT: 33.9 % — ABNORMAL LOW (ref 39.0–52.0)
HCT: 39 % (ref 39.0–52.0)
HCT: 22.1 % — ABNORMAL LOW (ref 39.0–52.0)
HCT: 23 % — ABNORMAL LOW (ref 39.0–52.0)
HCT: 23.3 % — ABNORMAL LOW (ref 39.0–52.0)
HCT: 23.4 % — ABNORMAL LOW (ref 39.0–52.0)
HCT: 23.8 % — ABNORMAL LOW (ref 39.0–52.0)
HCT: 24.5 % — ABNORMAL LOW (ref 39.0–52.0)
HCT: 24.8 % — ABNORMAL LOW (ref 39.0–52.0)
HCT: 25 % — ABNORMAL LOW (ref 39.0–52.0)
HCT: 25.1 % — ABNORMAL LOW (ref 39.0–52.0)
HCT: 25.7 % — ABNORMAL LOW (ref 39.0–52.0)
HCT: 25.7 % — ABNORMAL LOW (ref 39.0–52.0)
HCT: 27.3 % — ABNORMAL LOW (ref 39.0–52.0)
HCT: 27.6 % — ABNORMAL LOW (ref 39.0–52.0)
HCT: 29.3 % — ABNORMAL LOW (ref 39.0–52.0)
HCT: 29.4 % — ABNORMAL LOW (ref 39.0–52.0)
HCT: 32.5 % — ABNORMAL LOW (ref 39.0–52.0)
HCT: 32.9 % — ABNORMAL LOW (ref 39.0–52.0)
HCT: 34.3 % — ABNORMAL LOW (ref 39.0–52.0)
Hemoglobin: 10.1 g/dL — ABNORMAL LOW (ref 13.0–17.0)
Hemoglobin: 10.7 g/dL — ABNORMAL LOW (ref 13.0–17.0)
Hemoglobin: 12.2 g/dL — ABNORMAL LOW (ref 13.0–17.0)
Hemoglobin: 8.1 g/dL — ABNORMAL LOW (ref 13.0–17.0)
Hemoglobin: 9.1 g/dL — ABNORMAL LOW (ref 13.0–17.0)
Hemoglobin: 9.8 g/dL — ABNORMAL LOW (ref 13.0–17.0)
Hemoglobin: 9.8 g/dL — ABNORMAL LOW (ref 13.0–17.0)
Hemoglobin: 10.4 g/dL — ABNORMAL LOW (ref 13.0–17.0)
Hemoglobin: 10.7 g/dL — ABNORMAL LOW (ref 13.0–17.0)
Hemoglobin: 11.1 g/dL — ABNORMAL LOW (ref 13.0–17.0)
Hemoglobin: 7 g/dL — ABNORMAL LOW (ref 13.0–17.0)
Hemoglobin: 7.2 g/dL — ABNORMAL LOW (ref 13.0–17.0)
Hemoglobin: 7.4 g/dL — ABNORMAL LOW (ref 13.0–17.0)
Hemoglobin: 7.5 g/dL — ABNORMAL LOW (ref 13.0–17.0)
Hemoglobin: 7.7 g/dL — ABNORMAL LOW (ref 13.0–17.0)
Hemoglobin: 7.8 g/dL — ABNORMAL LOW (ref 13.0–17.0)
Hemoglobin: 7.9 g/dL — ABNORMAL LOW (ref 13.0–17.0)
Hemoglobin: 8 g/dL — ABNORMAL LOW (ref 13.0–17.0)
Hemoglobin: 8.1 g/dL — ABNORMAL LOW (ref 13.0–17.0)
Hemoglobin: 8.3 g/dL — ABNORMAL LOW (ref 13.0–17.0)
Hemoglobin: 8.4 g/dL — ABNORMAL LOW (ref 13.0–17.0)
Hemoglobin: 8.6 g/dL — ABNORMAL LOW (ref 13.0–17.0)
Hemoglobin: 8.9 g/dL — ABNORMAL LOW (ref 13.0–17.0)
Hemoglobin: 9.3 g/dL — ABNORMAL LOW (ref 13.0–17.0)
Hemoglobin: 9.6 g/dL — ABNORMAL LOW (ref 13.0–17.0)
MCH: 29.9 pg (ref 26.0–34.0)
MCH: 30.1 pg (ref 26.0–34.0)
MCH: 30.2 pg (ref 26.0–34.0)
MCH: 30.4 pg (ref 26.0–34.0)
MCH: 30.4 pg (ref 26.0–34.0)
MCH: 30.5 pg (ref 26.0–34.0)
MCH: 30.7 pg (ref 26.0–34.0)
MCH: 29.3 pg (ref 26.0–34.0)
MCH: 29.4 pg (ref 26.0–34.0)
MCH: 29.5 pg (ref 26.0–34.0)
MCH: 29.5 pg (ref 26.0–34.0)
MCH: 29.6 pg (ref 26.0–34.0)
MCH: 29.8 pg (ref 26.0–34.0)
MCH: 30 pg (ref 26.0–34.0)
MCHC: 31.4 g/dL (ref 30.0–36.0)
MCHC: 31.4 g/dL (ref 30.0–36.0)
MCHC: 31.6 g/dL (ref 30.0–36.0)
MCHC: 31.7 g/dL (ref 30.0–36.0)
MCHC: 31.8 g/dL (ref 30.0–36.0)
MCHC: 31.9 g/dL (ref 30.0–36.0)
MCHC: 32 g/dL (ref 30.0–36.0)
MCHC: 31.3 g/dL (ref 30.0–36.0)
MCHC: 31.3 g/dL (ref 30.0–36.0)
MCHC: 31.6 g/dL (ref 30.0–36.0)
MCHC: 31.6 g/dL (ref 30.0–36.0)
MCHC: 31.7 g/dL (ref 30.0–36.0)
MCHC: 31.8 g/dL (ref 30.0–36.0)
MCHC: 32 g/dL (ref 30.0–36.0)
MCHC: 32 g/dL (ref 30.0–36.0)
MCHC: 32.1 g/dL (ref 30.0–36.0)
MCHC: 32.2 g/dL (ref 30.0–36.0)
MCHC: 32.2 g/dL (ref 30.0–36.0)
MCHC: 32.3 g/dL (ref 30.0–36.0)
MCHC: 32.4 g/dL (ref 30.0–36.0)
MCHC: 32.4 g/dL (ref 30.0–36.0)
MCHC: 32.4 g/dL (ref 30.0–36.0)
MCHC: 32.4 g/dL (ref 30.0–36.0)
MCHC: 32.6 g/dL (ref 30.0–36.0)
MCHC: 32.7 g/dL (ref 30.0–36.0)
MCV: 95.1 fL (ref 78.0–100.0)
MCV: 95.3 fL (ref 78.0–100.0)
MCV: 95.5 fL (ref 78.0–100.0)
MCV: 95.5 fL (ref 78.0–100.0)
MCV: 95.9 fL (ref 78.0–100.0)
MCV: 96 fL (ref 78.0–100.0)
MCV: 96.2 fL (ref 78.0–100.0)
MCV: 88.3 fL (ref 78.0–100.0)
MCV: 88.5 fL (ref 78.0–100.0)
MCV: 88.6 fL (ref 78.0–100.0)
MCV: 88.6 fL (ref 78.0–100.0)
MCV: 88.8 fL (ref 78.0–100.0)
MCV: 88.9 fL (ref 78.0–100.0)
MCV: 89 fL (ref 78.0–100.0)
MCV: 89.1 fL (ref 78.0–100.0)
MCV: 89.3 fL (ref 78.0–100.0)
MCV: 89.6 fL (ref 78.0–100.0)
MCV: 90.9 fL (ref 78.0–100.0)
MCV: 91.1 fL (ref 78.0–100.0)
MCV: 91.6 fL (ref 78.0–100.0)
MCV: 92.3 fL (ref 78.0–100.0)
MCV: 93.6 fL (ref 78.0–100.0)
MCV: 93.7 fL (ref 78.0–100.0)
MCV: 94.2 fL (ref 78.0–100.0)
MCV: 94.8 fL (ref 78.0–100.0)
Platelets: 345 10*3/uL (ref 150–400)
Platelets: 403 10*3/uL — ABNORMAL HIGH (ref 150–400)
Platelets: 447 10*3/uL — ABNORMAL HIGH (ref 150–400)
Platelets: 467 10*3/uL — ABNORMAL HIGH (ref 150–400)
Platelets: 539 10*3/uL — ABNORMAL HIGH (ref 150–400)
Platelets: 590 10*3/uL — ABNORMAL HIGH (ref 150–400)
Platelets: 627 10*3/uL — ABNORMAL HIGH (ref 150–400)
Platelets: 241 10*3/uL (ref 150–400)
Platelets: 242 10*3/uL (ref 150–400)
Platelets: 265 10*3/uL (ref 150–400)
Platelets: 279 10*3/uL (ref 150–400)
Platelets: 289 10*3/uL (ref 150–400)
Platelets: 296 10*3/uL (ref 150–400)
Platelets: 311 10*3/uL (ref 150–400)
Platelets: 327 10*3/uL (ref 150–400)
Platelets: 334 10*3/uL (ref 150–400)
Platelets: 354 10*3/uL (ref 150–400)
Platelets: 375 10*3/uL (ref 150–400)
Platelets: 379 10*3/uL (ref 150–400)
Platelets: 382 10*3/uL (ref 150–400)
Platelets: 385 10*3/uL (ref 150–400)
Platelets: 427 10*3/uL — ABNORMAL HIGH (ref 150–400)
Platelets: 457 10*3/uL — ABNORMAL HIGH (ref 150–400)
Platelets: 473 10*3/uL — ABNORMAL HIGH (ref 150–400)
Platelets: 683 10*3/uL — ABNORMAL HIGH (ref 150–400)
RBC: 2.66 MIL/uL — ABNORMAL LOW (ref 4.22–5.81)
RBC: 3 MIL/uL — ABNORMAL LOW (ref 4.22–5.81)
RBC: 3.22 MIL/uL — ABNORMAL LOW (ref 4.22–5.81)
RBC: 3.26 MIL/uL — ABNORMAL LOW (ref 4.22–5.81)
RBC: 3.28 MIL/uL — ABNORMAL LOW (ref 4.22–5.81)
RBC: 3.55 MIL/uL — ABNORMAL LOW (ref 4.22–5.81)
RBC: 4.06 MIL/uL — ABNORMAL LOW (ref 4.22–5.81)
RBC: 2.37 MIL/uL — ABNORMAL LOW (ref 4.22–5.81)
RBC: 2.44 MIL/uL — ABNORMAL LOW (ref 4.22–5.81)
RBC: 2.53 MIL/uL — ABNORMAL LOW (ref 4.22–5.81)
RBC: 2.57 MIL/uL — ABNORMAL LOW (ref 4.22–5.81)
RBC: 2.67 MIL/uL — ABNORMAL LOW (ref 4.22–5.81)
RBC: 2.67 MIL/uL — ABNORMAL LOW (ref 4.22–5.81)
RBC: 2.67 MIL/uL — ABNORMAL LOW (ref 4.22–5.81)
RBC: 2.79 MIL/uL — ABNORMAL LOW (ref 4.22–5.81)
RBC: 2.82 MIL/uL — ABNORMAL LOW (ref 4.22–5.81)
RBC: 2.83 MIL/uL — ABNORMAL LOW (ref 4.22–5.81)
RBC: 2.89 MIL/uL — ABNORMAL LOW (ref 4.22–5.81)
RBC: 3.04 MIL/uL — ABNORMAL LOW (ref 4.22–5.81)
RBC: 3.09 MIL/uL — ABNORMAL LOW (ref 4.22–5.81)
RBC: 3.11 MIL/uL — ABNORMAL LOW (ref 4.22–5.81)
RBC: 3.32 MIL/uL — ABNORMAL LOW (ref 4.22–5.81)
RBC: 3.65 MIL/uL — ABNORMAL LOW (ref 4.22–5.81)
RBC: 3.72 MIL/uL — ABNORMAL LOW (ref 4.22–5.81)
RBC: 3.89 MIL/uL — ABNORMAL LOW (ref 4.22–5.81)
RDW: 19 % — ABNORMAL HIGH (ref 11.5–15.5)
RDW: 19.3 % — ABNORMAL HIGH (ref 11.5–15.5)
RDW: 19.6 % — ABNORMAL HIGH (ref 11.5–15.5)
RDW: 20 % — ABNORMAL HIGH (ref 11.5–15.5)
RDW: 20.6 % — ABNORMAL HIGH (ref 11.5–15.5)
RDW: 20.7 % — ABNORMAL HIGH (ref 11.5–15.5)
RDW: 22.6 % — ABNORMAL HIGH (ref 11.5–15.5)
RDW: 17 % — ABNORMAL HIGH (ref 11.5–15.5)
RDW: 17.1 % — ABNORMAL HIGH (ref 11.5–15.5)
RDW: 17.2 % — ABNORMAL HIGH (ref 11.5–15.5)
RDW: 17.3 % — ABNORMAL HIGH (ref 11.5–15.5)
RDW: 17.3 % — ABNORMAL HIGH (ref 11.5–15.5)
RDW: 17.3 % — ABNORMAL HIGH (ref 11.5–15.5)
RDW: 17.6 % — ABNORMAL HIGH (ref 11.5–15.5)
RDW: 17.7 % — ABNORMAL HIGH (ref 11.5–15.5)
RDW: 17.7 % — ABNORMAL HIGH (ref 11.5–15.5)
RDW: 17.7 % — ABNORMAL HIGH (ref 11.5–15.5)
RDW: 18 % — ABNORMAL HIGH (ref 11.5–15.5)
RDW: 18 % — ABNORMAL HIGH (ref 11.5–15.5)
RDW: 18 % — ABNORMAL HIGH (ref 11.5–15.5)
RDW: 18.8 % — ABNORMAL HIGH (ref 11.5–15.5)
RDW: 19.1 % — ABNORMAL HIGH (ref 11.5–15.5)
RDW: 19.5 % — ABNORMAL HIGH (ref 11.5–15.5)
RDW: 19.6 % — ABNORMAL HIGH (ref 11.5–15.5)
RDW: 20.6 % — ABNORMAL HIGH (ref 11.5–15.5)
WBC: 11.2 10*3/uL — ABNORMAL HIGH (ref 4.0–10.5)
WBC: 12.7 10*3/uL — ABNORMAL HIGH (ref 4.0–10.5)
WBC: 14.1 10*3/uL — ABNORMAL HIGH (ref 4.0–10.5)
WBC: 14.1 10*3/uL — ABNORMAL HIGH (ref 4.0–10.5)
WBC: 17.9 10*3/uL — ABNORMAL HIGH (ref 4.0–10.5)
WBC: 18.1 10*3/uL — ABNORMAL HIGH (ref 4.0–10.5)
WBC: 8.6 10*3/uL (ref 4.0–10.5)
WBC: 13.4 10*3/uL — ABNORMAL HIGH (ref 4.0–10.5)
WBC: 14.8 10*3/uL — ABNORMAL HIGH (ref 4.0–10.5)
WBC: 15.6 10*3/uL — ABNORMAL HIGH (ref 4.0–10.5)
WBC: 15.7 10*3/uL — ABNORMAL HIGH (ref 4.0–10.5)
WBC: 15.8 10*3/uL — ABNORMAL HIGH (ref 4.0–10.5)
WBC: 16 10*3/uL — ABNORMAL HIGH (ref 4.0–10.5)
WBC: 16.6 10*3/uL — ABNORMAL HIGH (ref 4.0–10.5)
WBC: 16.7 10*3/uL — ABNORMAL HIGH (ref 4.0–10.5)
WBC: 17.3 10*3/uL — ABNORMAL HIGH (ref 4.0–10.5)
WBC: 18.1 10*3/uL — ABNORMAL HIGH (ref 4.0–10.5)
WBC: 18.3 10*3/uL — ABNORMAL HIGH (ref 4.0–10.5)
WBC: 19.3 10*3/uL — ABNORMAL HIGH (ref 4.0–10.5)
WBC: 20.8 10*3/uL — ABNORMAL HIGH (ref 4.0–10.5)
WBC: 22.4 10*3/uL — ABNORMAL HIGH (ref 4.0–10.5)
WBC: 24.2 10*3/uL — ABNORMAL HIGH (ref 4.0–10.5)
WBC: 24.4 10*3/uL — ABNORMAL HIGH (ref 4.0–10.5)
WBC: 25 10*3/uL — ABNORMAL HIGH (ref 4.0–10.5)
WBC: 25 10*3/uL — ABNORMAL HIGH (ref 4.0–10.5)

## 2010-12-26 LAB — DIFFERENTIAL
Basophils Absolute: 0.1 10*3/uL (ref 0.0–0.1)
Basophils Absolute: 0 10*3/uL (ref 0.0–0.1)
Basophils Absolute: 0 10*3/uL (ref 0.0–0.1)
Basophils Relative: 0 % (ref 0–1)
Basophils Relative: 0 % (ref 0–1)
Basophils Relative: 0 % (ref 0–1)
Eosinophils Absolute: 0.1 10*3/uL (ref 0.0–0.7)
Eosinophils Absolute: 0 10*3/uL (ref 0.0–0.7)
Eosinophils Absolute: 0.3 10*3/uL (ref 0.0–0.7)
Eosinophils Relative: 0 % (ref 0–5)
Eosinophils Relative: 0 % (ref 0–5)
Eosinophils Relative: 1 % (ref 0–5)
Lymphocytes Relative: 10 % — ABNORMAL LOW (ref 12–46)
Lymphocytes Relative: 13 % (ref 12–46)
Lymphocytes Relative: 5 % — ABNORMAL LOW (ref 12–46)
Lymphs Abs: 1.5 10*3/uL (ref 0.7–4.0)
Lymphs Abs: 1.1 10*3/uL (ref 0.7–4.0)
Lymphs Abs: 2.6 10*3/uL (ref 0.7–4.0)
Monocytes Absolute: 1.5 10*3/uL — ABNORMAL HIGH (ref 0.1–1.0)
Monocytes Absolute: 0.4 10*3/uL (ref 0.1–1.0)
Monocytes Absolute: 0.4 10*3/uL (ref 0.1–1.0)
Monocytes Relative: 11 % (ref 3–12)
Monocytes Relative: 2 % — ABNORMAL LOW (ref 3–12)
Monocytes Relative: 2 % — ABNORMAL LOW (ref 3–12)
Neutro Abs: 11 10*3/uL — ABNORMAL HIGH (ref 1.7–7.7)
Neutro Abs: 17.5 10*3/uL — ABNORMAL HIGH (ref 1.7–7.7)
Neutro Abs: 20.9 10*3/uL — ABNORMAL HIGH (ref 1.7–7.7)
Neutrophils Relative %: 78 % — ABNORMAL HIGH (ref 43–77)
Neutrophils Relative %: 84 % — ABNORMAL HIGH (ref 43–77)
Neutrophils Relative %: 93 % — ABNORMAL HIGH (ref 43–77)

## 2010-12-26 LAB — GLUCOSE, CAPILLARY
Glucose-Capillary: 100 mg/dL — ABNORMAL HIGH (ref 70–99)
Glucose-Capillary: 101 mg/dL — ABNORMAL HIGH (ref 70–99)
Glucose-Capillary: 102 mg/dL — ABNORMAL HIGH (ref 70–99)
Glucose-Capillary: 103 mg/dL — ABNORMAL HIGH (ref 70–99)
Glucose-Capillary: 104 mg/dL — ABNORMAL HIGH (ref 70–99)
Glucose-Capillary: 104 mg/dL — ABNORMAL HIGH (ref 70–99)
Glucose-Capillary: 105 mg/dL — ABNORMAL HIGH (ref 70–99)
Glucose-Capillary: 105 mg/dL — ABNORMAL HIGH (ref 70–99)
Glucose-Capillary: 106 mg/dL — ABNORMAL HIGH (ref 70–99)
Glucose-Capillary: 106 mg/dL — ABNORMAL HIGH (ref 70–99)
Glucose-Capillary: 106 mg/dL — ABNORMAL HIGH (ref 70–99)
Glucose-Capillary: 106 mg/dL — ABNORMAL HIGH (ref 70–99)
Glucose-Capillary: 107 mg/dL — ABNORMAL HIGH (ref 70–99)
Glucose-Capillary: 107 mg/dL — ABNORMAL HIGH (ref 70–99)
Glucose-Capillary: 110 mg/dL — ABNORMAL HIGH (ref 70–99)
Glucose-Capillary: 110 mg/dL — ABNORMAL HIGH (ref 70–99)
Glucose-Capillary: 111 mg/dL — ABNORMAL HIGH (ref 70–99)
Glucose-Capillary: 112 mg/dL — ABNORMAL HIGH (ref 70–99)
Glucose-Capillary: 112 mg/dL — ABNORMAL HIGH (ref 70–99)
Glucose-Capillary: 113 mg/dL — ABNORMAL HIGH (ref 70–99)
Glucose-Capillary: 113 mg/dL — ABNORMAL HIGH (ref 70–99)
Glucose-Capillary: 115 mg/dL — ABNORMAL HIGH (ref 70–99)
Glucose-Capillary: 115 mg/dL — ABNORMAL HIGH (ref 70–99)
Glucose-Capillary: 116 mg/dL — ABNORMAL HIGH (ref 70–99)
Glucose-Capillary: 118 mg/dL — ABNORMAL HIGH (ref 70–99)
Glucose-Capillary: 119 mg/dL — ABNORMAL HIGH (ref 70–99)
Glucose-Capillary: 119 mg/dL — ABNORMAL HIGH (ref 70–99)
Glucose-Capillary: 122 mg/dL — ABNORMAL HIGH (ref 70–99)
Glucose-Capillary: 124 mg/dL — ABNORMAL HIGH (ref 70–99)
Glucose-Capillary: 124 mg/dL — ABNORMAL HIGH (ref 70–99)
Glucose-Capillary: 124 mg/dL — ABNORMAL HIGH (ref 70–99)
Glucose-Capillary: 127 mg/dL — ABNORMAL HIGH (ref 70–99)
Glucose-Capillary: 128 mg/dL — ABNORMAL HIGH (ref 70–99)
Glucose-Capillary: 129 mg/dL — ABNORMAL HIGH (ref 70–99)
Glucose-Capillary: 131 mg/dL — ABNORMAL HIGH (ref 70–99)
Glucose-Capillary: 132 mg/dL — ABNORMAL HIGH (ref 70–99)
Glucose-Capillary: 133 mg/dL — ABNORMAL HIGH (ref 70–99)
Glucose-Capillary: 133 mg/dL — ABNORMAL HIGH (ref 70–99)
Glucose-Capillary: 133 mg/dL — ABNORMAL HIGH (ref 70–99)
Glucose-Capillary: 134 mg/dL — ABNORMAL HIGH (ref 70–99)
Glucose-Capillary: 136 mg/dL — ABNORMAL HIGH (ref 70–99)
Glucose-Capillary: 137 mg/dL — ABNORMAL HIGH (ref 70–99)
Glucose-Capillary: 137 mg/dL — ABNORMAL HIGH (ref 70–99)
Glucose-Capillary: 138 mg/dL — ABNORMAL HIGH (ref 70–99)
Glucose-Capillary: 139 mg/dL — ABNORMAL HIGH (ref 70–99)
Glucose-Capillary: 139 mg/dL — ABNORMAL HIGH (ref 70–99)
Glucose-Capillary: 141 mg/dL — ABNORMAL HIGH (ref 70–99)
Glucose-Capillary: 141 mg/dL — ABNORMAL HIGH (ref 70–99)
Glucose-Capillary: 141 mg/dL — ABNORMAL HIGH (ref 70–99)
Glucose-Capillary: 144 mg/dL — ABNORMAL HIGH (ref 70–99)
Glucose-Capillary: 146 mg/dL — ABNORMAL HIGH (ref 70–99)
Glucose-Capillary: 146 mg/dL — ABNORMAL HIGH (ref 70–99)
Glucose-Capillary: 148 mg/dL — ABNORMAL HIGH (ref 70–99)
Glucose-Capillary: 151 mg/dL — ABNORMAL HIGH (ref 70–99)
Glucose-Capillary: 152 mg/dL — ABNORMAL HIGH (ref 70–99)
Glucose-Capillary: 156 mg/dL — ABNORMAL HIGH (ref 70–99)
Glucose-Capillary: 197 mg/dL — ABNORMAL HIGH (ref 70–99)
Glucose-Capillary: 201 mg/dL — ABNORMAL HIGH (ref 70–99)
Glucose-Capillary: 64 mg/dL — ABNORMAL LOW (ref 70–99)
Glucose-Capillary: 81 mg/dL (ref 70–99)
Glucose-Capillary: 84 mg/dL (ref 70–99)
Glucose-Capillary: 89 mg/dL (ref 70–99)
Glucose-Capillary: 92 mg/dL (ref 70–99)
Glucose-Capillary: 92 mg/dL (ref 70–99)
Glucose-Capillary: 93 mg/dL (ref 70–99)
Glucose-Capillary: 93 mg/dL (ref 70–99)
Glucose-Capillary: 94 mg/dL (ref 70–99)
Glucose-Capillary: 99 mg/dL (ref 70–99)
Glucose-Capillary: 99 mg/dL (ref 70–99)
Glucose-Capillary: 99 mg/dL (ref 70–99)
Glucose-Capillary: 100 mg/dL — ABNORMAL HIGH (ref 70–99)
Glucose-Capillary: 100 mg/dL — ABNORMAL HIGH (ref 70–99)
Glucose-Capillary: 101 mg/dL — ABNORMAL HIGH (ref 70–99)
Glucose-Capillary: 102 mg/dL — ABNORMAL HIGH (ref 70–99)
Glucose-Capillary: 103 mg/dL — ABNORMAL HIGH (ref 70–99)
Glucose-Capillary: 104 mg/dL — ABNORMAL HIGH (ref 70–99)
Glucose-Capillary: 107 mg/dL — ABNORMAL HIGH (ref 70–99)
Glucose-Capillary: 107 mg/dL — ABNORMAL HIGH (ref 70–99)
Glucose-Capillary: 108 mg/dL — ABNORMAL HIGH (ref 70–99)
Glucose-Capillary: 108 mg/dL — ABNORMAL HIGH (ref 70–99)
Glucose-Capillary: 108 mg/dL — ABNORMAL HIGH (ref 70–99)
Glucose-Capillary: 109 mg/dL — ABNORMAL HIGH (ref 70–99)
Glucose-Capillary: 109 mg/dL — ABNORMAL HIGH (ref 70–99)
Glucose-Capillary: 111 mg/dL — ABNORMAL HIGH (ref 70–99)
Glucose-Capillary: 112 mg/dL — ABNORMAL HIGH (ref 70–99)
Glucose-Capillary: 113 mg/dL — ABNORMAL HIGH (ref 70–99)
Glucose-Capillary: 114 mg/dL — ABNORMAL HIGH (ref 70–99)
Glucose-Capillary: 115 mg/dL — ABNORMAL HIGH (ref 70–99)
Glucose-Capillary: 116 mg/dL — ABNORMAL HIGH (ref 70–99)
Glucose-Capillary: 116 mg/dL — ABNORMAL HIGH (ref 70–99)
Glucose-Capillary: 116 mg/dL — ABNORMAL HIGH (ref 70–99)
Glucose-Capillary: 118 mg/dL — ABNORMAL HIGH (ref 70–99)
Glucose-Capillary: 118 mg/dL — ABNORMAL HIGH (ref 70–99)
Glucose-Capillary: 120 mg/dL — ABNORMAL HIGH (ref 70–99)
Glucose-Capillary: 120 mg/dL — ABNORMAL HIGH (ref 70–99)
Glucose-Capillary: 120 mg/dL — ABNORMAL HIGH (ref 70–99)
Glucose-Capillary: 121 mg/dL — ABNORMAL HIGH (ref 70–99)
Glucose-Capillary: 121 mg/dL — ABNORMAL HIGH (ref 70–99)
Glucose-Capillary: 121 mg/dL — ABNORMAL HIGH (ref 70–99)
Glucose-Capillary: 122 mg/dL — ABNORMAL HIGH (ref 70–99)
Glucose-Capillary: 122 mg/dL — ABNORMAL HIGH (ref 70–99)
Glucose-Capillary: 123 mg/dL — ABNORMAL HIGH (ref 70–99)
Glucose-Capillary: 123 mg/dL — ABNORMAL HIGH (ref 70–99)
Glucose-Capillary: 125 mg/dL — ABNORMAL HIGH (ref 70–99)
Glucose-Capillary: 125 mg/dL — ABNORMAL HIGH (ref 70–99)
Glucose-Capillary: 125 mg/dL — ABNORMAL HIGH (ref 70–99)
Glucose-Capillary: 126 mg/dL — ABNORMAL HIGH (ref 70–99)
Glucose-Capillary: 126 mg/dL — ABNORMAL HIGH (ref 70–99)
Glucose-Capillary: 127 mg/dL — ABNORMAL HIGH (ref 70–99)
Glucose-Capillary: 128 mg/dL — ABNORMAL HIGH (ref 70–99)
Glucose-Capillary: 129 mg/dL — ABNORMAL HIGH (ref 70–99)
Glucose-Capillary: 129 mg/dL — ABNORMAL HIGH (ref 70–99)
Glucose-Capillary: 129 mg/dL — ABNORMAL HIGH (ref 70–99)
Glucose-Capillary: 129 mg/dL — ABNORMAL HIGH (ref 70–99)
Glucose-Capillary: 129 mg/dL — ABNORMAL HIGH (ref 70–99)
Glucose-Capillary: 131 mg/dL — ABNORMAL HIGH (ref 70–99)
Glucose-Capillary: 131 mg/dL — ABNORMAL HIGH (ref 70–99)
Glucose-Capillary: 131 mg/dL — ABNORMAL HIGH (ref 70–99)
Glucose-Capillary: 132 mg/dL — ABNORMAL HIGH (ref 70–99)
Glucose-Capillary: 132 mg/dL — ABNORMAL HIGH (ref 70–99)
Glucose-Capillary: 132 mg/dL — ABNORMAL HIGH (ref 70–99)
Glucose-Capillary: 133 mg/dL — ABNORMAL HIGH (ref 70–99)
Glucose-Capillary: 133 mg/dL — ABNORMAL HIGH (ref 70–99)
Glucose-Capillary: 135 mg/dL — ABNORMAL HIGH (ref 70–99)
Glucose-Capillary: 135 mg/dL — ABNORMAL HIGH (ref 70–99)
Glucose-Capillary: 135 mg/dL — ABNORMAL HIGH (ref 70–99)
Glucose-Capillary: 135 mg/dL — ABNORMAL HIGH (ref 70–99)
Glucose-Capillary: 136 mg/dL — ABNORMAL HIGH (ref 70–99)
Glucose-Capillary: 136 mg/dL — ABNORMAL HIGH (ref 70–99)
Glucose-Capillary: 136 mg/dL — ABNORMAL HIGH (ref 70–99)
Glucose-Capillary: 138 mg/dL — ABNORMAL HIGH (ref 70–99)
Glucose-Capillary: 141 mg/dL — ABNORMAL HIGH (ref 70–99)
Glucose-Capillary: 144 mg/dL — ABNORMAL HIGH (ref 70–99)
Glucose-Capillary: 146 mg/dL — ABNORMAL HIGH (ref 70–99)
Glucose-Capillary: 147 mg/dL — ABNORMAL HIGH (ref 70–99)
Glucose-Capillary: 148 mg/dL — ABNORMAL HIGH (ref 70–99)
Glucose-Capillary: 149 mg/dL — ABNORMAL HIGH (ref 70–99)
Glucose-Capillary: 149 mg/dL — ABNORMAL HIGH (ref 70–99)
Glucose-Capillary: 149 mg/dL — ABNORMAL HIGH (ref 70–99)
Glucose-Capillary: 149 mg/dL — ABNORMAL HIGH (ref 70–99)
Glucose-Capillary: 151 mg/dL — ABNORMAL HIGH (ref 70–99)
Glucose-Capillary: 151 mg/dL — ABNORMAL HIGH (ref 70–99)
Glucose-Capillary: 154 mg/dL — ABNORMAL HIGH (ref 70–99)
Glucose-Capillary: 154 mg/dL — ABNORMAL HIGH (ref 70–99)
Glucose-Capillary: 154 mg/dL — ABNORMAL HIGH (ref 70–99)
Glucose-Capillary: 156 mg/dL — ABNORMAL HIGH (ref 70–99)
Glucose-Capillary: 157 mg/dL — ABNORMAL HIGH (ref 70–99)
Glucose-Capillary: 157 mg/dL — ABNORMAL HIGH (ref 70–99)
Glucose-Capillary: 157 mg/dL — ABNORMAL HIGH (ref 70–99)
Glucose-Capillary: 158 mg/dL — ABNORMAL HIGH (ref 70–99)
Glucose-Capillary: 167 mg/dL — ABNORMAL HIGH (ref 70–99)
Glucose-Capillary: 169 mg/dL — ABNORMAL HIGH (ref 70–99)
Glucose-Capillary: 169 mg/dL — ABNORMAL HIGH (ref 70–99)
Glucose-Capillary: 169 mg/dL — ABNORMAL HIGH (ref 70–99)
Glucose-Capillary: 174 mg/dL — ABNORMAL HIGH (ref 70–99)
Glucose-Capillary: 178 mg/dL — ABNORMAL HIGH (ref 70–99)
Glucose-Capillary: 180 mg/dL — ABNORMAL HIGH (ref 70–99)
Glucose-Capillary: 206 mg/dL — ABNORMAL HIGH (ref 70–99)
Glucose-Capillary: 277 mg/dL — ABNORMAL HIGH (ref 70–99)
Glucose-Capillary: 283 mg/dL — ABNORMAL HIGH (ref 70–99)
Glucose-Capillary: 303 mg/dL — ABNORMAL HIGH (ref 70–99)
Glucose-Capillary: 320 mg/dL — ABNORMAL HIGH (ref 70–99)
Glucose-Capillary: 334 mg/dL — ABNORMAL HIGH (ref 70–99)
Glucose-Capillary: 345 mg/dL — ABNORMAL HIGH (ref 70–99)
Glucose-Capillary: 66 mg/dL — ABNORMAL LOW (ref 70–99)
Glucose-Capillary: 79 mg/dL (ref 70–99)
Glucose-Capillary: 79 mg/dL (ref 70–99)
Glucose-Capillary: 81 mg/dL (ref 70–99)
Glucose-Capillary: 83 mg/dL (ref 70–99)
Glucose-Capillary: 84 mg/dL (ref 70–99)
Glucose-Capillary: 85 mg/dL (ref 70–99)
Glucose-Capillary: 85 mg/dL (ref 70–99)
Glucose-Capillary: 87 mg/dL (ref 70–99)
Glucose-Capillary: 92 mg/dL (ref 70–99)
Glucose-Capillary: 94 mg/dL (ref 70–99)
Glucose-Capillary: 95 mg/dL (ref 70–99)
Glucose-Capillary: 96 mg/dL (ref 70–99)
Glucose-Capillary: 99 mg/dL (ref 70–99)

## 2010-12-26 LAB — BASIC METABOLIC PANEL
BUN: 10 mg/dL (ref 6–23)
BUN: 11 mg/dL (ref 6–23)
BUN: 12 mg/dL (ref 6–23)
BUN: 7 mg/dL (ref 6–23)
BUN: 8 mg/dL (ref 6–23)
BUN: 9 mg/dL (ref 6–23)
BUN: 10 mg/dL (ref 6–23)
BUN: 10 mg/dL (ref 6–23)
BUN: 13 mg/dL (ref 6–23)
BUN: 13 mg/dL (ref 6–23)
BUN: 15 mg/dL (ref 6–23)
BUN: 16 mg/dL (ref 6–23)
BUN: 18 mg/dL (ref 6–23)
BUN: 5 mg/dL — ABNORMAL LOW (ref 6–23)
BUN: 7 mg/dL (ref 6–23)
BUN: 7 mg/dL (ref 6–23)
BUN: 7 mg/dL (ref 6–23)
BUN: 7 mg/dL (ref 6–23)
BUN: 7 mg/dL (ref 6–23)
BUN: 7 mg/dL (ref 6–23)
BUN: 7 mg/dL (ref 6–23)
BUN: 7 mg/dL (ref 6–23)
BUN: 8 mg/dL (ref 6–23)
BUN: 8 mg/dL (ref 6–23)
BUN: 8 mg/dL (ref 6–23)
BUN: 8 mg/dL (ref 6–23)
BUN: 8 mg/dL (ref 6–23)
BUN: 8 mg/dL (ref 6–23)
BUN: 9 mg/dL (ref 6–23)
BUN: 9 mg/dL (ref 6–23)
CO2: 25 mEq/L (ref 19–32)
CO2: 26 mEq/L (ref 19–32)
CO2: 29 mEq/L (ref 19–32)
CO2: 29 mEq/L (ref 19–32)
CO2: 30 mEq/L (ref 19–32)
CO2: 30 mEq/L (ref 19–32)
CO2: 19 mEq/L (ref 19–32)
CO2: 20 mEq/L (ref 19–32)
CO2: 21 mEq/L (ref 19–32)
CO2: 21 mEq/L (ref 19–32)
CO2: 22 mEq/L (ref 19–32)
CO2: 22 mEq/L (ref 19–32)
CO2: 22 mEq/L (ref 19–32)
CO2: 22 mEq/L (ref 19–32)
CO2: 23 mEq/L (ref 19–32)
CO2: 23 mEq/L (ref 19–32)
CO2: 24 mEq/L (ref 19–32)
CO2: 24 mEq/L (ref 19–32)
CO2: 24 mEq/L (ref 19–32)
CO2: 25 mEq/L (ref 19–32)
CO2: 25 mEq/L (ref 19–32)
CO2: 26 mEq/L (ref 19–32)
CO2: 26 mEq/L (ref 19–32)
CO2: 27 mEq/L (ref 19–32)
CO2: 27 mEq/L (ref 19–32)
CO2: 28 mEq/L (ref 19–32)
CO2: 29 mEq/L (ref 19–32)
CO2: 30 mEq/L (ref 19–32)
CO2: 32 mEq/L (ref 19–32)
CO2: 33 mEq/L — ABNORMAL HIGH (ref 19–32)
Calcium: 8.2 mg/dL — ABNORMAL LOW (ref 8.4–10.5)
Calcium: 8.5 mg/dL (ref 8.4–10.5)
Calcium: 8.7 mg/dL (ref 8.4–10.5)
Calcium: 8.9 mg/dL (ref 8.4–10.5)
Calcium: 9 mg/dL (ref 8.4–10.5)
Calcium: 9 mg/dL (ref 8.4–10.5)
Calcium: 7.1 mg/dL — ABNORMAL LOW (ref 8.4–10.5)
Calcium: 7.2 mg/dL — ABNORMAL LOW (ref 8.4–10.5)
Calcium: 7.3 mg/dL — ABNORMAL LOW (ref 8.4–10.5)
Calcium: 7.5 mg/dL — ABNORMAL LOW (ref 8.4–10.5)
Calcium: 7.6 mg/dL — ABNORMAL LOW (ref 8.4–10.5)
Calcium: 7.8 mg/dL — ABNORMAL LOW (ref 8.4–10.5)
Calcium: 7.9 mg/dL — ABNORMAL LOW (ref 8.4–10.5)
Calcium: 8 mg/dL — ABNORMAL LOW (ref 8.4–10.5)
Calcium: 8.1 mg/dL — ABNORMAL LOW (ref 8.4–10.5)
Calcium: 8.1 mg/dL — ABNORMAL LOW (ref 8.4–10.5)
Calcium: 8.1 mg/dL — ABNORMAL LOW (ref 8.4–10.5)
Calcium: 8.2 mg/dL — ABNORMAL LOW (ref 8.4–10.5)
Calcium: 8.2 mg/dL — ABNORMAL LOW (ref 8.4–10.5)
Calcium: 8.3 mg/dL — ABNORMAL LOW (ref 8.4–10.5)
Calcium: 8.3 mg/dL — ABNORMAL LOW (ref 8.4–10.5)
Calcium: 8.3 mg/dL — ABNORMAL LOW (ref 8.4–10.5)
Calcium: 8.4 mg/dL (ref 8.4–10.5)
Calcium: 8.4 mg/dL (ref 8.4–10.5)
Calcium: 8.5 mg/dL (ref 8.4–10.5)
Calcium: 8.5 mg/dL (ref 8.4–10.5)
Calcium: 8.5 mg/dL (ref 8.4–10.5)
Calcium: 8.5 mg/dL (ref 8.4–10.5)
Calcium: 8.5 mg/dL (ref 8.4–10.5)
Calcium: 8.5 mg/dL (ref 8.4–10.5)
Chloride: 102 mEq/L (ref 96–112)
Chloride: 102 mEq/L (ref 96–112)
Chloride: 102 mEq/L (ref 96–112)
Chloride: 105 mEq/L (ref 96–112)
Chloride: 105 mEq/L (ref 96–112)
Chloride: 99 mEq/L (ref 96–112)
Chloride: 101 mEq/L (ref 96–112)
Chloride: 101 mEq/L (ref 96–112)
Chloride: 102 mEq/L (ref 96–112)
Chloride: 105 mEq/L (ref 96–112)
Chloride: 105 mEq/L (ref 96–112)
Chloride: 106 mEq/L (ref 96–112)
Chloride: 106 mEq/L (ref 96–112)
Chloride: 107 mEq/L (ref 96–112)
Chloride: 108 mEq/L (ref 96–112)
Chloride: 109 mEq/L (ref 96–112)
Chloride: 110 mEq/L (ref 96–112)
Chloride: 110 mEq/L (ref 96–112)
Chloride: 111 mEq/L (ref 96–112)
Chloride: 111 mEq/L (ref 96–112)
Chloride: 111 mEq/L (ref 96–112)
Chloride: 111 mEq/L (ref 96–112)
Chloride: 111 mEq/L (ref 96–112)
Chloride: 111 mEq/L (ref 96–112)
Chloride: 111 mEq/L (ref 96–112)
Chloride: 111 mEq/L (ref 96–112)
Chloride: 112 mEq/L (ref 96–112)
Chloride: 113 mEq/L — ABNORMAL HIGH (ref 96–112)
Chloride: 113 mEq/L — ABNORMAL HIGH (ref 96–112)
Chloride: 115 mEq/L — ABNORMAL HIGH (ref 96–112)
Creatinine, Ser: 0.6 mg/dL (ref 0.4–1.5)
Creatinine, Ser: 0.63 mg/dL (ref 0.4–1.5)
Creatinine, Ser: 0.7 mg/dL (ref 0.4–1.5)
Creatinine, Ser: 0.7 mg/dL (ref 0.4–1.5)
Creatinine, Ser: 0.72 mg/dL (ref 0.4–1.5)
Creatinine, Ser: 0.86 mg/dL (ref 0.4–1.5)
Creatinine, Ser: 0.59 mg/dL (ref 0.4–1.5)
Creatinine, Ser: 0.6 mg/dL (ref 0.4–1.5)
Creatinine, Ser: 0.6 mg/dL (ref 0.4–1.5)
Creatinine, Ser: 0.6 mg/dL (ref 0.4–1.5)
Creatinine, Ser: 0.62 mg/dL (ref 0.4–1.5)
Creatinine, Ser: 0.62 mg/dL (ref 0.4–1.5)
Creatinine, Ser: 0.63 mg/dL (ref 0.4–1.5)
Creatinine, Ser: 0.63 mg/dL (ref 0.4–1.5)
Creatinine, Ser: 0.66 mg/dL (ref 0.4–1.5)
Creatinine, Ser: 0.68 mg/dL (ref 0.4–1.5)
Creatinine, Ser: 0.7 mg/dL (ref 0.4–1.5)
Creatinine, Ser: 0.73 mg/dL (ref 0.4–1.5)
Creatinine, Ser: 0.76 mg/dL (ref 0.4–1.5)
Creatinine, Ser: 0.77 mg/dL (ref 0.4–1.5)
Creatinine, Ser: 0.77 mg/dL (ref 0.4–1.5)
Creatinine, Ser: 0.79 mg/dL (ref 0.4–1.5)
Creatinine, Ser: 0.81 mg/dL (ref 0.4–1.5)
Creatinine, Ser: 0.81 mg/dL (ref 0.4–1.5)
Creatinine, Ser: 0.83 mg/dL (ref 0.4–1.5)
Creatinine, Ser: 0.85 mg/dL (ref 0.4–1.5)
Creatinine, Ser: 0.88 mg/dL (ref 0.4–1.5)
Creatinine, Ser: 0.89 mg/dL (ref 0.4–1.5)
Creatinine, Ser: 0.89 mg/dL (ref 0.4–1.5)
Creatinine, Ser: 0.9 mg/dL (ref 0.4–1.5)
GFR calc Af Amer: >60 mL/min (ref >60)
GFR calc Af Amer: >60 mL/min (ref >60)
GFR calc Af Amer: >60 mL/min (ref >60)
GFR calc Af Amer: >60 mL/min (ref >60)
GFR calc Af Amer: >60 mL/min (ref >60)
GFR calc Af Amer: >60 mL/min (ref >60)
GFR calc Af Amer: >60 mL/min (ref >60)
GFR calc Af Amer: >60 mL/min (ref >60)
GFR calc Af Amer: >60 mL/min (ref >60)
GFR calc Af Amer: >60 mL/min (ref >60)
GFR calc Af Amer: >60 mL/min (ref >60)
GFR calc Af Amer: >60 mL/min (ref >60)
GFR calc Af Amer: >60 mL/min (ref >60)
GFR calc Af Amer: >60 mL/min (ref >60)
GFR calc Af Amer: >60 mL/min (ref >60)
GFR calc Af Amer: >60 mL/min (ref >60)
GFR calc Af Amer: >60 mL/min (ref >60)
GFR calc Af Amer: >60 mL/min (ref >60)
GFR calc Af Amer: >60 mL/min (ref >60)
GFR calc Af Amer: >60 mL/min (ref >60)
GFR calc Af Amer: >60 mL/min (ref >60)
GFR calc Af Amer: >60 mL/min (ref >60)
GFR calc Af Amer: >60 mL/min (ref >60)
GFR calc Af Amer: >60 mL/min (ref >60)
GFR calc Af Amer: >60 mL/min (ref >60)
GFR calc Af Amer: >60 mL/min (ref >60)
GFR calc Af Amer: >60 mL/min (ref >60)
GFR calc Af Amer: >60 mL/min (ref >60)
GFR calc Af Amer: >60 mL/min (ref >60)
GFR calc Af Amer: >60 mL/min (ref >60)
GFR calc non Af Amer: >60 mL/min (ref >60)
GFR calc non Af Amer: >60 mL/min (ref >60)
GFR calc non Af Amer: >60 mL/min (ref >60)
GFR calc non Af Amer: >60 mL/min (ref >60)
GFR calc non Af Amer: >60 mL/min (ref >60)
GFR calc non Af Amer: >60 mL/min (ref >60)
GFR calc non Af Amer: >60 mL/min (ref >60)
GFR calc non Af Amer: >60 mL/min (ref >60)
GFR calc non Af Amer: >60 mL/min (ref >60)
GFR calc non Af Amer: >60 mL/min (ref >60)
GFR calc non Af Amer: >60 mL/min (ref >60)
GFR calc non Af Amer: >60 mL/min (ref >60)
GFR calc non Af Amer: >60 mL/min (ref >60)
GFR calc non Af Amer: >60 mL/min (ref >60)
GFR calc non Af Amer: >60 mL/min (ref >60)
GFR calc non Af Amer: >60 mL/min (ref >60)
GFR calc non Af Amer: >60 mL/min (ref >60)
GFR calc non Af Amer: >60 mL/min (ref >60)
GFR calc non Af Amer: >60 mL/min (ref >60)
GFR calc non Af Amer: >60 mL/min (ref >60)
GFR calc non Af Amer: >60 mL/min (ref >60)
GFR calc non Af Amer: >60 mL/min (ref >60)
GFR calc non Af Amer: >60 mL/min (ref >60)
GFR calc non Af Amer: >60 mL/min (ref >60)
GFR calc non Af Amer: >60 mL/min (ref >60)
GFR calc non Af Amer: >60 mL/min (ref >60)
GFR calc non Af Amer: >60 mL/min (ref >60)
GFR calc non Af Amer: >60 mL/min (ref >60)
GFR calc non Af Amer: >60 mL/min (ref >60)
GFR calc non Af Amer: >60 mL/min (ref >60)
Glucose, Bld: 104 mg/dL — ABNORMAL HIGH (ref 70–99)
Glucose, Bld: 111 mg/dL — ABNORMAL HIGH (ref 70–99)
Glucose, Bld: 122 mg/dL — ABNORMAL HIGH (ref 70–99)
Glucose, Bld: 92 mg/dL (ref 70–99)
Glucose, Bld: 93 mg/dL (ref 70–99)
Glucose, Bld: 95 mg/dL (ref 70–99)
Glucose, Bld: 102 mg/dL — ABNORMAL HIGH (ref 70–99)
Glucose, Bld: 105 mg/dL — ABNORMAL HIGH (ref 70–99)
Glucose, Bld: 107 mg/dL — ABNORMAL HIGH (ref 70–99)
Glucose, Bld: 110 mg/dL — ABNORMAL HIGH (ref 70–99)
Glucose, Bld: 113 mg/dL — ABNORMAL HIGH (ref 70–99)
Glucose, Bld: 117 mg/dL — ABNORMAL HIGH (ref 70–99)
Glucose, Bld: 119 mg/dL — ABNORMAL HIGH (ref 70–99)
Glucose, Bld: 122 mg/dL — ABNORMAL HIGH (ref 70–99)
Glucose, Bld: 125 mg/dL — ABNORMAL HIGH (ref 70–99)
Glucose, Bld: 126 mg/dL — ABNORMAL HIGH (ref 70–99)
Glucose, Bld: 128 mg/dL — ABNORMAL HIGH (ref 70–99)
Glucose, Bld: 128 mg/dL — ABNORMAL HIGH (ref 70–99)
Glucose, Bld: 134 mg/dL — ABNORMAL HIGH (ref 70–99)
Glucose, Bld: 137 mg/dL — ABNORMAL HIGH (ref 70–99)
Glucose, Bld: 144 mg/dL — ABNORMAL HIGH (ref 70–99)
Glucose, Bld: 150 mg/dL — ABNORMAL HIGH (ref 70–99)
Glucose, Bld: 163 mg/dL — ABNORMAL HIGH (ref 70–99)
Glucose, Bld: 166 mg/dL — ABNORMAL HIGH (ref 70–99)
Glucose, Bld: 167 mg/dL — ABNORMAL HIGH (ref 70–99)
Glucose, Bld: 246 mg/dL — ABNORMAL HIGH (ref 70–99)
Glucose, Bld: 340 mg/dL — ABNORMAL HIGH (ref 70–99)
Glucose, Bld: 356 mg/dL — ABNORMAL HIGH (ref 70–99)
Glucose, Bld: 91 mg/dL (ref 70–99)
Glucose, Bld: 99 mg/dL (ref 70–99)
Potassium: 3.7 mEq/L (ref 3.5–5.1)
Potassium: 3.9 mEq/L (ref 3.5–5.1)
Potassium: 4.2 mEq/L (ref 3.5–5.1)
Potassium: 4.3 mEq/L (ref 3.5–5.1)
Potassium: 4.5 mEq/L (ref 3.5–5.1)
Potassium: 4.5 mEq/L (ref 3.5–5.1)
Potassium: 3 mEq/L — ABNORMAL LOW (ref 3.5–5.1)
Potassium: 3.1 mEq/L — ABNORMAL LOW (ref 3.5–5.1)
Potassium: 3.1 mEq/L — ABNORMAL LOW (ref 3.5–5.1)
Potassium: 3.2 mEq/L — ABNORMAL LOW (ref 3.5–5.1)
Potassium: 3.3 mEq/L — ABNORMAL LOW (ref 3.5–5.1)
Potassium: 3.3 mEq/L — ABNORMAL LOW (ref 3.5–5.1)
Potassium: 3.5 mEq/L (ref 3.5–5.1)
Potassium: 3.5 mEq/L (ref 3.5–5.1)
Potassium: 3.5 mEq/L (ref 3.5–5.1)
Potassium: 3.5 mEq/L (ref 3.5–5.1)
Potassium: 3.5 mEq/L (ref 3.5–5.1)
Potassium: 3.5 mEq/L (ref 3.5–5.1)
Potassium: 3.5 mEq/L (ref 3.5–5.1)
Potassium: 3.9 mEq/L (ref 3.5–5.1)
Potassium: 4 mEq/L (ref 3.5–5.1)
Potassium: 4 mEq/L (ref 3.5–5.1)
Potassium: 4.1 mEq/L (ref 3.5–5.1)
Potassium: 4.4 mEq/L (ref 3.5–5.1)
Potassium: 4.5 mEq/L (ref 3.5–5.1)
Potassium: 4.5 mEq/L (ref 3.5–5.1)
Potassium: 4.6 mEq/L (ref 3.5–5.1)
Potassium: 4.7 mEq/L (ref 3.5–5.1)
Potassium: 4.7 mEq/L (ref 3.5–5.1)
Potassium: 4.8 mEq/L (ref 3.5–5.1)
Sodium: 136 mEq/L (ref 135–145)
Sodium: 137 mEq/L (ref 135–145)
Sodium: 139 mEq/L (ref 135–145)
Sodium: 139 mEq/L (ref 135–145)
Sodium: 139 mEq/L (ref 135–145)
Sodium: 141 mEq/L (ref 135–145)
Sodium: 134 mEq/L — ABNORMAL LOW (ref 135–145)
Sodium: 135 mEq/L (ref 135–145)
Sodium: 135 mEq/L (ref 135–145)
Sodium: 135 mEq/L (ref 135–145)
Sodium: 135 mEq/L (ref 135–145)
Sodium: 136 mEq/L (ref 135–145)
Sodium: 137 mEq/L (ref 135–145)
Sodium: 137 mEq/L (ref 135–145)
Sodium: 137 mEq/L (ref 135–145)
Sodium: 137 mEq/L (ref 135–145)
Sodium: 138 mEq/L (ref 135–145)
Sodium: 138 mEq/L (ref 135–145)
Sodium: 138 mEq/L (ref 135–145)
Sodium: 139 mEq/L (ref 135–145)
Sodium: 139 mEq/L (ref 135–145)
Sodium: 139 mEq/L (ref 135–145)
Sodium: 140 mEq/L (ref 135–145)
Sodium: 141 mEq/L (ref 135–145)
Sodium: 143 mEq/L (ref 135–145)
Sodium: 143 mEq/L (ref 135–145)
Sodium: 145 mEq/L (ref 135–145)
Sodium: 147 mEq/L — ABNORMAL HIGH (ref 135–145)
Sodium: 147 mEq/L — ABNORMAL HIGH (ref 135–145)
Sodium: 147 mEq/L — ABNORMAL HIGH (ref 135–145)

## 2010-12-26 LAB — MAGNESIUM
Magnesium: 1.9 mg/dL (ref 1.5–2.5)
Magnesium: 2 mg/dL (ref 1.5–2.5)
Magnesium: 1.6 mg/dL (ref 1.5–2.5)
Magnesium: 1.7 mg/dL (ref 1.5–2.5)
Magnesium: 1.8 mg/dL (ref 1.5–2.5)
Magnesium: 1.9 mg/dL (ref 1.5–2.5)
Magnesium: 2.2 mg/dL (ref 1.5–2.5)
Magnesium: 2.2 mg/dL (ref 1.5–2.5)

## 2010-12-26 LAB — HEPARIN LEVEL (UNFRACTIONATED)
Heparin Unfractionated: 0.32 IU/mL (ref 0.30–0.70)
Heparin Unfractionated: <0.1 IU/mL — ABNORMAL LOW (ref 0.30–0.70)
Heparin Unfractionated: 0.12 IU/mL — ABNORMAL LOW (ref 0.30–0.70)
Heparin Unfractionated: 0.16 IU/mL — ABNORMAL LOW (ref 0.30–0.70)
Heparin Unfractionated: 0.23 IU/mL — ABNORMAL LOW (ref 0.30–0.70)
Heparin Unfractionated: 0.25 IU/mL — ABNORMAL LOW (ref 0.30–0.70)
Heparin Unfractionated: 0.28 IU/mL — ABNORMAL LOW (ref 0.30–0.70)
Heparin Unfractionated: 0.37 IU/mL (ref 0.30–0.70)
Heparin Unfractionated: 0.44 IU/mL (ref 0.30–0.70)
Heparin Unfractionated: 0.6 IU/mL (ref 0.30–0.70)

## 2010-12-26 LAB — RAPID URINE DRUG SCREEN, HOSP PERFORMED
Amphetamines: NOT DETECTED
Barbiturates: NOT DETECTED
Benzodiazepines: NOT DETECTED
Cocaine: NOT DETECTED
Opiates: NOT DETECTED
Tetrahydrocannabinol: NOT DETECTED

## 2010-12-26 LAB — BLOOD GAS, ARTERIAL
Acid-Base Excess: 3 mmol/L — ABNORMAL HIGH (ref 0.0–2.0)
Acid-base deficit: 3.9 mmol/L — ABNORMAL HIGH (ref 0.0–2.0)
Acid-base deficit: 4.2 mmol/L — ABNORMAL HIGH (ref 0.0–2.0)
Acid-base deficit: 4.8 mmol/L — ABNORMAL HIGH (ref 0.0–2.0)
Acid-base deficit: 6.2 mmol/L — ABNORMAL HIGH (ref 0.0–2.0)
Bicarbonate: 19.1 mEq/L — ABNORMAL LOW (ref 20.0–24.0)
Bicarbonate: 19.4 mEq/L — ABNORMAL LOW (ref 20.0–24.0)
Bicarbonate: 20.2 mEq/L (ref 20.0–24.0)
Bicarbonate: 21.4 mEq/L (ref 20.0–24.0)
Bicarbonate: 26.7 mEq/L — ABNORMAL HIGH (ref 20.0–24.0)
Drawn by: 317771
Drawn by: 330991
FIO2: 0.3 %
FIO2: 0.3 %
FIO2: 0.3 %
FIO2: 0.6 %
FIO2: 60 %
MECHVT: 410 mL
MECHVT: 410 mL
MECHVT: 410 mL
MECHVT: 580 mL
Mode: POSITIVE
O2 Saturation: 96.8 %
O2 Saturation: 98.8 %
O2 Saturation: 99.6 %
O2 Saturation: 99.8 %
O2 Saturation: 99.9 %
PEEP: 5 cmH2O
PEEP: 5 cmH2O
PEEP: 5 cmH2O
PEEP: 5 cmH2O
PEEP: 5 cmH2O
Patient temperature: 89.6
Patient temperature: 91.2
Patient temperature: 95.6
Patient temperature: 98.6
Patient temperature: 99.5
Pressure support: 5 cmH2O
RATE: 12 resp/min
RATE: 15 resp/min
RATE: 15 resp/min
RATE: 15 resp/min
TCO2: 20.3 mmol/L (ref 0–100)
TCO2: 20.4 mmol/L (ref 0–100)
TCO2: 21.3 mmol/L (ref 0–100)
TCO2: 22.8 mmol/L (ref 0–100)
TCO2: 27.9 mmol/L (ref 0–100)
pCO2 arterial: 26.1 mmHg — ABNORMAL LOW (ref 35.0–45.0)
pCO2 arterial: 36.4 mmHg (ref 35.0–45.0)
pCO2 arterial: 36.6 mmHg (ref 35.0–45.0)
pCO2 arterial: 37.3 mmHg (ref 35.0–45.0)
pCO2 arterial: 38.8 mmHg (ref 35.0–45.0)
pH, Arterial: 7.319 — ABNORMAL LOW (ref 7.350–7.450)
pH, Arterial: 7.362 (ref 7.350–7.450)
pH, Arterial: 7.363 (ref 7.350–7.450)
pH, Arterial: 7.452 — ABNORMAL HIGH (ref 7.350–7.450)
pH, Arterial: 7.456 — ABNORMAL HIGH (ref 7.350–7.450)
pO2, Arterial: 112 mmHg — ABNORMAL HIGH (ref 80.0–100.0)
pO2, Arterial: 116 mmHg — ABNORMAL HIGH (ref 80.0–100.0)
pO2, Arterial: 276 mmHg — ABNORMAL HIGH (ref 80.0–100.0)
pO2, Arterial: 85.6 mmHg (ref 80.0–100.0)
pO2, Arterial: 98 mmHg (ref 80.0–100.0)

## 2010-12-26 LAB — URINALYSIS, ROUTINE W REFLEX MICROSCOPIC
Bilirubin Urine: NEGATIVE
Glucose, UA: NEGATIVE mg/dL
Ketones, ur: NEGATIVE mg/dL
Leukocytes, UA: NEGATIVE
Nitrite: NEGATIVE
Protein, ur: 100 mg/dL — AB
Specific Gravity, Urine: 1.019 (ref 1.005–1.030)
Urobilinogen, UA: 1 mg/dL (ref 0.0–1.0)
pH: 7.5 (ref 5.0–8.0)

## 2010-12-26 LAB — POCT I-STAT 3, ART BLOOD GAS (G3+)
Acid-base deficit: 5 mmol/L — ABNORMAL HIGH (ref 0.0–2.0)
Bicarbonate: 19.3 mEq/L — ABNORMAL LOW (ref 20.0–24.0)
O2 Saturation: 100 %
TCO2: 20 mmol/L (ref 0–100)
pCO2 arterial: 33.5 mmHg — ABNORMAL LOW (ref 35.0–45.0)
pH, Arterial: 7.369 (ref 7.350–7.450)
pO2, Arterial: 440 mmHg — ABNORMAL HIGH (ref 80.0–100.0)

## 2010-12-26 LAB — PHOSPHORUS
Phosphorus: 3.5 mg/dL (ref 2.3–4.6)
Phosphorus: 4.5 mg/dL (ref 2.3–4.6)
Phosphorus: 2.6 mg/dL (ref 2.3–4.6)
Phosphorus: 2.7 mg/dL (ref 2.3–4.6)
Phosphorus: 2.9 mg/dL (ref 2.3–4.6)
Phosphorus: 3.5 mg/dL (ref 2.3–4.6)
Phosphorus: 4.1 mg/dL (ref 2.3–4.6)
Phosphorus: 4.4 mg/dL (ref 2.3–4.6)

## 2010-12-26 LAB — CLOSTRIDIUM DIFFICILE EIA
C difficile Toxins A+B, EIA: NEGATIVE
C difficile Toxins A+B, EIA: NEGATIVE
C difficile Toxins A+B, EIA: NEGATIVE

## 2010-12-26 LAB — CORTISOL: Cortisol, Plasma: 16.8 ug/dL

## 2010-12-26 LAB — CULTURE, BAL-QUANTITATIVE

## 2010-12-26 LAB — POCT CARDIAC MARKERS
CKMB, poc: 9.6 ng/mL (ref 1.0–8.0)
Myoglobin, poc: >500 ng/mL (ref 12–200)
Troponin i, poc: 0.37 ng/mL (ref 0.00–0.09)

## 2010-12-26 LAB — HEPATIC FUNCTION PANEL
ALT: 33 U/L (ref 0–53)
AST: 96 U/L — ABNORMAL HIGH (ref 0–37)
Albumin: 2.1 g/dL — ABNORMAL LOW (ref 3.5–5.2)
Alkaline Phosphatase: 76 U/L (ref 39–117)
Bilirubin, Direct: 0.2 mg/dL (ref 0.0–0.3)
Indirect Bilirubin: 0.1 mg/dL — ABNORMAL LOW (ref 0.3–0.9)
Total Bilirubin: 0.3 mg/dL (ref 0.3–1.2)
Total Protein: 5.1 g/dL — ABNORMAL LOW (ref 6.0–8.3)

## 2010-12-26 LAB — CROSSMATCH
ABO/RH(D): B POS
Antibody Screen: NEGATIVE

## 2010-12-26 LAB — HEMOGLOBIN A1C
Hgb A1c MFr Bld: 4.6 % (ref <5.7)
Mean Plasma Glucose: 85 mg/dL (ref <117)

## 2010-12-26 LAB — CULTURE, RESPIRATORY W GRAM STAIN

## 2010-12-26 LAB — TROPONIN I: Troponin I: 6.21 ng/mL (ref 0.00–0.06)

## 2010-12-26 LAB — LIPID PANEL
Cholesterol: 76 mg/dL (ref 0–200)
HDL: 35 mg/dL — ABNORMAL LOW (ref >39)
LDL Cholesterol: 31 mg/dL (ref 0–99)
Total CHOL/HDL Ratio: 2.2 RATIO
Triglycerides: 48 mg/dL (ref <150)
VLDL: 10 mg/dL (ref 0–40)

## 2010-12-26 LAB — ABO/RH: ABO/RH(D): B POS

## 2010-12-26 LAB — COMPREHENSIVE METABOLIC PANEL
ALT: 15 U/L (ref 0–53)
AST: 31 U/L (ref 0–37)
Albumin: 2.6 g/dL — ABNORMAL LOW (ref 3.5–5.2)
Alkaline Phosphatase: 63 U/L (ref 39–117)
BUN: 16 mg/dL (ref 6–23)
CO2: 27 mEq/L (ref 19–32)
Calcium: 8.9 mg/dL (ref 8.4–10.5)
Chloride: 101 mEq/L (ref 96–112)
Creatinine, Ser: 0.89 mg/dL (ref 0.4–1.5)
GFR calc Af Amer: >60 mL/min (ref >60)
GFR calc non Af Amer: >60 mL/min (ref >60)
Glucose, Bld: 89 mg/dL (ref 70–99)
Potassium: 5 mEq/L (ref 3.5–5.1)
Sodium: 139 mEq/L (ref 135–145)
Total Bilirubin: 0.5 mg/dL (ref 0.3–1.2)
Total Protein: 6.5 g/dL (ref 6.0–8.3)

## 2010-12-26 LAB — CARDIAC PANEL(CRET KIN+CKTOT+MB+TROPI)
CK, MB: 1.3 ng/mL (ref 0.3–4.0)
CK, MB: 1.3 ng/mL (ref 0.3–4.0)
CK, MB: 1.4 ng/mL (ref 0.3–4.0)
CK, MB: 205.1 ng/mL (ref 0.3–4.0)
CK, MB: 291.4 ng/mL (ref 0.3–4.0)
Relative Index: INVALID (ref 0.0–2.5)
Relative Index: INVALID (ref 0.0–2.5)
Relative Index: INVALID (ref 0.0–2.5)
Relative Index: 5.8 — ABNORMAL HIGH (ref 0.0–2.5)
Relative Index: 7.8 — ABNORMAL HIGH (ref 0.0–2.5)
Total CK: 33 U/L (ref 7–232)
Total CK: 35 U/L (ref 7–232)
Total CK: 35 U/L (ref 7–232)
Total CK: 3547 U/L — ABNORMAL HIGH (ref 7–232)
Total CK: 3731 U/L — ABNORMAL HIGH (ref 7–232)
Troponin I: 0.02 ng/mL (ref 0.00–0.06)
Troponin I: 0.02 ng/mL (ref 0.00–0.06)
Troponin I: 0.03 ng/mL (ref 0.00–0.06)
Troponin I: 13.64 ng/mL (ref 0.00–0.06)
Troponin I: 16.59 ng/mL (ref 0.00–0.06)

## 2010-12-26 LAB — CULTURE, RESPIRATORY

## 2010-12-26 LAB — URINE CULTURE
Colony Count: NO GROWTH
Culture: NO GROWTH

## 2010-12-26 LAB — CULTURE, BLOOD (ROUTINE X 2)
Culture: NO GROWTH
Culture: NO GROWTH
Culture: NO GROWTH
Culture: NO GROWTH

## 2010-12-26 LAB — URINE MICROSCOPIC-ADD ON

## 2010-12-26 LAB — CK TOTAL AND CKMB (NOT AT ARMC)
CK, MB: 59.8 ng/mL (ref 0.3–4.0)
Relative Index: 3.9 — ABNORMAL HIGH (ref 0.0–2.5)
Total CK: 1531 U/L — ABNORMAL HIGH (ref 7–232)

## 2010-12-26 LAB — POCT I-STAT, CHEM 8
BUN: 7 mg/dL (ref 6–23)
Calcium, Ion: 0.97 mmol/L — ABNORMAL LOW (ref 1.12–1.32)
Chloride: 106 mEq/L (ref 96–112)
Creatinine, Ser: 0.9 mg/dL (ref 0.4–1.5)
Glucose, Bld: 230 mg/dL — ABNORMAL HIGH (ref 70–99)
HCT: 33 % — ABNORMAL LOW (ref 39.0–52.0)
Hemoglobin: 11.2 g/dL — ABNORMAL LOW (ref 13.0–17.0)
Potassium: 3.7 mEq/L (ref 3.5–5.1)
Sodium: 138 mEq/L (ref 135–145)
TCO2: 17 mmol/L (ref 0–100)

## 2010-12-26 LAB — MRSA PCR SCREENING
MRSA by PCR: NEGATIVE
MRSA by PCR: NEGATIVE

## 2010-12-26 LAB — LACTIC ACID, PLASMA: Lactic Acid, Venous: 2.3 mmol/L — ABNORMAL HIGH (ref 0.5–2.2)

## 2010-12-26 LAB — APTT
aPTT: 30 seconds (ref 24–37)
aPTT: 33 seconds (ref 24–37)
aPTT: 34 seconds (ref 24–37)

## 2010-12-26 LAB — CULTURE, BAL-QUANTITATIVE W GRAM STAIN: Colony Count: >=100000

## 2010-12-26 LAB — BRAIN NATRIURETIC PEPTIDE
Pro B Natriuretic peptide (BNP): 532 pg/mL — ABNORMAL HIGH (ref 0.0–100.0)
Pro B Natriuretic peptide (BNP): <30 pg/mL (ref 0.0–100.0)

## 2010-12-26 LAB — PROCALCITONIN
Procalcitonin: 0.68 ng/mL
Procalcitonin: <0.5 ng/mL

## 2010-12-26 LAB — PROTIME-INR
INR: 1.1 (ref 0.00–1.49)
INR: 1.24 (ref 0.00–1.49)
INR: 1.26 (ref 0.00–1.49)
Prothrombin Time: 14.1 seconds (ref 11.6–15.2)
Prothrombin Time: 15.5 seconds — ABNORMAL HIGH (ref 11.6–15.2)
Prothrombin Time: 15.7 seconds — ABNORMAL HIGH (ref 11.6–15.2)

## 2010-12-26 LAB — VALPROIC ACID LEVEL: Valproic Acid Lvl: 27.3 ug/mL — ABNORMAL LOW (ref 50.0–100.0)

## 2010-12-26 LAB — LIPASE, BLOOD: Lipase: 105 U/L — ABNORMAL HIGH (ref 11–59)

## 2010-12-26 LAB — D-DIMER, QUANTITATIVE (NOT AT ARMC): D-Dimer, Quant: 6.39 ug/mL-FEU — ABNORMAL HIGH (ref 0.00–0.48)

## 2010-12-26 LAB — ETHANOL: Alcohol, Ethyl (B): <5 mg/dL (ref 0–10)

## 2010-12-27 ENCOUNTER — Encounter (INDEPENDENT_AMBULATORY_CARE_PROVIDER_SITE_OTHER): Payer: Self-pay | Admitting: Internal Medicine

## 2010-12-27 ENCOUNTER — Ambulatory Visit: Payer: Medicaid Other | Attending: Internal Medicine | Admitting: Physical Therapy

## 2010-12-27 DIAGNOSIS — M2550 Pain in unspecified joint: Secondary | ICD-10-CM | POA: Insufficient documentation

## 2010-12-27 DIAGNOSIS — M2560 Stiffness of unspecified joint, not elsewhere classified: Secondary | ICD-10-CM | POA: Insufficient documentation

## 2010-12-27 DIAGNOSIS — M2420 Disorder of ligament, unspecified site: Secondary | ICD-10-CM | POA: Insufficient documentation

## 2010-12-27 DIAGNOSIS — IMO0001 Reserved for inherently not codable concepts without codable children: Secondary | ICD-10-CM | POA: Insufficient documentation

## 2010-12-27 DIAGNOSIS — M6281 Muscle weakness (generalized): Secondary | ICD-10-CM | POA: Insufficient documentation

## 2010-12-27 DIAGNOSIS — M629 Disorder of muscle, unspecified: Secondary | ICD-10-CM | POA: Insufficient documentation

## 2010-12-28 LAB — URINALYSIS, ROUTINE W REFLEX MICROSCOPIC
Glucose, UA: NEGATIVE mg/dL
Hgb urine dipstick: NEGATIVE
Ketones, ur: 15 mg/dL — AB
Nitrite: NEGATIVE
Protein, ur: 30 mg/dL — AB
Specific Gravity, Urine: 1.029 (ref 1.005–1.030)
Urobilinogen, UA: 1 mg/dL (ref 0.0–1.0)
pH: 7 (ref 5.0–8.0)

## 2010-12-28 LAB — URINE MICROSCOPIC-ADD ON

## 2010-12-28 LAB — CBC
HCT: 34.4 % — ABNORMAL LOW (ref 39.0–52.0)
Hemoglobin: 11.3 g/dL — ABNORMAL LOW (ref 13.0–17.0)
MCHC: 32.8 g/dL (ref 30.0–36.0)
MCV: 86.8 fL (ref 78.0–100.0)
Platelets: 458 10*3/uL — ABNORMAL HIGH (ref 150–400)
RBC: 3.96 MIL/uL — ABNORMAL LOW (ref 4.22–5.81)
RDW: 18.3 % — ABNORMAL HIGH (ref 11.5–15.5)
WBC: 8.8 10*3/uL (ref 4.0–10.5)

## 2010-12-28 LAB — COMPREHENSIVE METABOLIC PANEL
ALT: 9 U/L (ref 0–53)
AST: 19 U/L (ref 0–37)
Albumin: 3 g/dL — ABNORMAL LOW (ref 3.5–5.2)
Alkaline Phosphatase: 67 U/L (ref 39–117)
BUN: 7 mg/dL (ref 6–23)
CO2: 27 mEq/L (ref 19–32)
Calcium: 8.9 mg/dL (ref 8.4–10.5)
Chloride: 103 mEq/L (ref 96–112)
Creatinine, Ser: 0.72 mg/dL (ref 0.4–1.5)
GFR calc Af Amer: >60 mL/min (ref >60)
GFR calc non Af Amer: >60 mL/min (ref >60)
Glucose, Bld: 118 mg/dL — ABNORMAL HIGH (ref 70–99)
Potassium: 4.1 mEq/L (ref 3.5–5.1)
Sodium: 136 mEq/L (ref 135–145)
Total Bilirubin: 0.8 mg/dL (ref 0.3–1.2)
Total Protein: 6.7 g/dL (ref 6.0–8.3)

## 2010-12-28 LAB — POCT CARDIAC MARKERS
CKMB, poc: <1 ng/mL — ABNORMAL LOW (ref 1.0–8.0)
Myoglobin, poc: 49.8 ng/mL (ref 12–200)
Troponin i, poc: <0.05 ng/mL (ref 0.00–0.09)

## 2010-12-28 LAB — AMYLASE: Amylase: 620 U/L — ABNORMAL HIGH (ref 0–105)

## 2010-12-28 LAB — CARDIAC PANEL(CRET KIN+CKTOT+MB+TROPI)
CK, MB: 0.9 ng/mL (ref 0.3–4.0)
Relative Index: INVALID (ref 0.0–2.5)
Total CK: 61 U/L (ref 7–232)
Troponin I: 0.02 ng/mL (ref 0.00–0.06)

## 2010-12-28 LAB — BASIC METABOLIC PANEL
BUN: 4 mg/dL — ABNORMAL LOW (ref 6–23)
CO2: 27 mEq/L (ref 19–32)
Calcium: 8.5 mg/dL (ref 8.4–10.5)
Chloride: 105 mEq/L (ref 96–112)
Creatinine, Ser: 0.75 mg/dL (ref 0.4–1.5)
GFR calc Af Amer: >60 mL/min (ref >60)
GFR calc non Af Amer: >60 mL/min (ref >60)
Glucose, Bld: 102 mg/dL — ABNORMAL HIGH (ref 70–99)
Potassium: 3.7 mEq/L (ref 3.5–5.1)
Sodium: 135 mEq/L (ref 135–145)

## 2010-12-28 LAB — APTT: aPTT: 32 seconds (ref 24–37)

## 2010-12-28 LAB — LIPASE, BLOOD
Lipase: 115 U/L — ABNORMAL HIGH (ref 11–59)
Lipase: 191 U/L — ABNORMAL HIGH (ref 11–59)

## 2010-12-28 LAB — URINE CULTURE
Colony Count: NO GROWTH
Culture: NO GROWTH

## 2010-12-28 LAB — MAGNESIUM
Magnesium: 1.4 mg/dL — ABNORMAL LOW (ref 1.5–2.5)
Magnesium: 2.1 mg/dL (ref 1.5–2.5)

## 2010-12-28 LAB — LACTIC ACID, PLASMA: Lactic Acid, Venous: 1.5 mmol/L (ref 0.5–2.2)

## 2010-12-28 LAB — DIFFERENTIAL
Basophils Absolute: 0 10*3/uL (ref 0.0–0.1)
Basophils Relative: 0 % (ref 0–1)
Eosinophils Absolute: 0.1 10*3/uL (ref 0.0–0.7)
Eosinophils Relative: 1 % (ref 0–5)
Lymphocytes Relative: 18 % (ref 12–46)
Lymphs Abs: 1.5 10*3/uL (ref 0.7–4.0)
Monocytes Absolute: 0.4 10*3/uL (ref 0.1–1.0)
Monocytes Relative: 5 % (ref 3–12)
Neutro Abs: 6.7 10*3/uL (ref 1.7–7.7)
Neutrophils Relative %: 76 % (ref 43–77)

## 2010-12-28 LAB — PHOSPHORUS: Phosphorus: 3.6 mg/dL (ref 2.3–4.6)

## 2010-12-28 LAB — PROTIME-INR
INR: 1.15 (ref 0.00–1.49)
Prothrombin Time: 14.6 seconds (ref 11.6–15.2)

## 2010-12-28 NOTE — Progress Notes (Signed)
Summary: Dosage question-not clearly understood  Phone Note Call from Patient Call back at [phone]   Summary of Call: Question about medication ?Bal?, increasing dosage question is this each dose or per day? Steven Frey/sister administers meds, call her cell # listed. Initial call taken by: Elgie Collard,  December 20, 2010 12:17 PM  Follow-up for Phone Call        Clarified change in Baclofen dosage with sister -- verbalized understanding and agreement.  Sherian Maroon RN  December 20, 2010 5:24 PM

## 2010-12-28 NOTE — Progress Notes (Signed)
Summary: Spasms the same  Phone Note Outgoing Call   Call placed by: Shonna Chock Call placed to: Patient Summary of Call: Called pt. to see how he is doing -- Has p/u both prescriptions (Oxycodone and Baclofen) -- He no longer has pain but he is still having spasms throughout the day on/off.   Follow-up for Phone Call        Are the spasms any better--less severe or less frequent? Follow-up by: Mack Hook MD,  December 08, 2010 10:39 AM  Additional Follow-up for Phone Call Additional follow up Details #1::        Called pt. -- spasms are getting worse -- Constantly having them as before they would come and go. -- Aquilla Solian CMA  December 09, 2010 4:08 PM   ???His pain is better, but the spasms are worse???  The spasms were causing the pain--this is why I am questioning his responses.   If he truly feels the spasms are worse, have him increase his Baclofen dose to 1 1/2 tabs three times a day. Please plan to call him back with and update then in 1 week. Additional Follow-up by: Mack Hook MD,  December 09, 2010 6:07 PM    Additional Follow-up for Phone Call Additional follow up Details #2::    Called [phone], no answer or VM... Will try later... Aquilla Solian CMA  December 16, 2010 3:15 PM   Spoke with pt. -- states that pain is better, spasms are the same as they were during last OV.  Not instructed to increase Baclofen as the spasms are no worse.  F/U appt. scheduled 01/04/11.  Sherian Maroon RN  December 17, 2010 11:38 AM  Jobe Igo him go ahead and increase the dose as above since the spasms are no better. Will see if any improvement then when follows up.   Mack Hook MD  December 20, 2010 8:31 AM   Pt.'s sister called re dosage change -- instructed as per provider's response, confirmed f/u appt. on 01/04/11.  Sister verbalized understanding and agreement. Follow-up by: Sherian Maroon RN,  December 20, 2010 5:26 PM

## 2010-12-28 NOTE — Assessment & Plan Note (Signed)
Summary: 3 month fu appt/rs from [facility]/[facility]   Visit Type:  Follow-up Primary Provider:      History of Present Illness: [name] presents today for further EP follow-up.  He previously had out of hospital witnessed VF arrest 6/11 in the setting of heavy ETOH and electrolyte depletion for which he was treated at [facility].  He had QT prolongation during cooling and required prolonged hospitalization due to respiratory failure.  He also has a h/o heavy ETOH with cirrhosis and chronic pancreatitis.  Most recently, he has been hospitalized for hepatic abscess vs concominant hepatocellular carcinoma (for which he is not felt to be a surgical candidate) and recurrent pancreatitis.  He subsequently underwent pancreatic ductal stenting and has done quite well.  He has gained 29 lbs since seeing me last. He reports compliance with ETOH cessation.  He smokes occasionally. The patient denies symptoms of palpitations, chest pain, shortness of breath, orthopnea, PND, lower extremity edema, dizziness, presyncope, syncope, or neurologic sequela.  His primary concern is poor appetite and ongoing weight loss.  Current Medications (verified): 1)  Creon 12000 Unit Cpep (Pancrelipase (Lip-Prot-Amyl)) .... Once Daily 2)  Levetiracetam 1000 Mg Tabs (Levetiracetam) .... 1/2 Tab By Mouth in Morning and 1 Tab By Mouth in Evening. 3)  Ativan 1 Mg Tabs (Lorazepam) .... Take One By Mouth No More Than Every 8 Hours As Needed For Anxiety or Agitation 4)  Vitamin B-1 100 Mg Tabs (Thiamine Hcl) .... Take 1 Tablet By Mouth Once A Day 5)  Carafate 1 Gm Tabs (Sucralfate) .... Three Times A Day 6)  Nexium 40 Mg Cpdr (Esomeprazole Magnesium) .Marland Kitchen.. 1 Capsule By Mouth Once A Day For Stomach 7)  Zofran 4 Mg Tabs (Ondansetron Hcl) .... As Needed For Nausea 8)  Folic Acid 1 Mg Tabs (Folic Acid) .... Take 1 Tablet By Mouth Once A Day 9)  Multivitamins  Tabs (Multiple Vitamin) .... Take 1 Tablet By Mouth Once A Day - Get This  Over The Counter 10)  Nitrostat 0.4 Mg Subl (Nitroglycerin) .Marland Kitchen.. 1 Sublingual As Needed Chest Pain 11)  Vitamin B-12 1000 Mcg Tabs (Cyanocobalamin) .... Take 1 Tablet By Mouth Once A Day 12)  Oxycodone Hcl 10 Mg Tabs (Oxycodone Hcl) .... Once Daily 13)  Carvedilol 3.125 Mg Tabs (Carvedilol) .... Take One Tablet By Mouth Twice A Day 14)  Ferrex 150 150 Mg Caps (Polysaccharide Iron Complex) .Marland Kitchen.. 1 Cap By Mouth Two Times A Day 15)  Baclofen 10 Mg Tabs (Baclofen) .Marland Kitchen.. 1 1/2 Tabs By Mouth Three Times A Day For Right Arm Muscle Spasm--May Fill Only Every 30 Days  Allergies: 1)  ! Lisinopril 2)  ! * Azithromycin  Past History:  Past Medical History: Reviewed history from 10/01/2010 and no changes required. 1. Angioedema rx requiring intubation/vent support suspected secondary to ACE I (but also on zithromax) 5/09 hospitalization 2. Smoker 3. Etoh abuse 4. Chronic Pancreatitis (10/08 hosp)     a.  admx 02/2010 . . . pseudocyst aspirated during admxn     b. pancreatic ductal stent placement at [facility] 11/11 5. 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 Gastritis (alcohol induced) 7. Depression . . . no hx of meds 8. Borderline elevation in BP in past 9.VF arrest 6/11 successfully rescucitated with prolonged hospitalization due to respiratory failure QT prolongation during cooling, now resolved  Past Surgical History: Reviewed history from 10/01/2010 and no changes required. right inguinal hernia repair right wrist ORIF for fx  pancreatic stent placement  Social History: Reviewed history from 10/01/2010 and no changes required. h/o alcohol abuse, last used prior to hospitalization for VF arrest 6/11. He denies further ETOH since that time   a.  never been in [place], etc. Works in Clinical cytogeneticist. .  . laid off in 06/2008 Patient is divorced with 2 children Current Smoker (1/3-1 ppd for 30+ years) Drug use-no   Vital Signs:  Patient profile:   55 year old  male Height:      68 inches Weight:      137 pounds BMI:     20.91 Pulse rate:   74 / minute BP sitting:   140 / 80  (left arm)  Vitals Entered By: Margaretmary Bayley CMA (December 22, 2010 11:51 AM)  Physical Exam  General:  NAD Head:  normocephalic and atraumatic Eyes:  PERRLA/EOM intact; conjunctiva and lids normal. Mouth:  pharynx pink and moist, poor dentition, and teeth missing.   Neck:  supple Lungs:  Clear bilaterally to auscultation and percussion. Heart:  RRR, no m/r/g Abdomen:  Mild epigastric tenderness soft, normal bowel sounds, no hepatomegaly, and no splenomegaly.   Msk:  R arm muscle spasms are prominent Extremities:  No clubbing or cyanosis. Neurologic:  R arm muscle spasm Skin:  Intact without lesions or rashes.   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  I agree with his decision.  Problem # 2:  HYPERTENSION, BENIGN (ICD-401.1) stable  Problem # 3:  ALCOHOL ABUSE (ICD-305.00) cessation encourged  Problem # 4:  CIGARETTE SMOKER (ICD-305.1) cessation advised  Patient Instructions: 1)  Your physician recommends that you schedule a follow-up appointment in: [place]

## 2010-12-28 NOTE — Progress Notes (Signed)
Summary: ativan refill  Phone Note Refill Request Call back at [phone]   Refills Requested: Medication #1:  ATIVAN 1 MG TABS Take one by mouth no more than every 8 hours as needed for anxiety or agitation Initial call taken by: Elgie Collard,  December 17, 2010 10:27 AM Reason for Call: Refill Medication Summary of Call: Ativan-lorazepam, [facility] [address] Initial call taken by: Elgie Collard,  December 17, 2010 10:26 AM    Prescriptions: ATIVAN 1 MG TABS (LORAZEPAM) Take one by mouth no more than every 8 hours as needed for anxiety or agitation  #90 x 0   Entered and Authorized by:   Mack Hook MD   Signed by:   Mack Hook MD on 12/20/2010   Method used:   Telephoned to ...       [facility]  [address] [id number] (retail)       [address]       [place], [place]  [address]       Ph: [phone]       Fax: [phone]   RxID:   [id number]  Theresa--can you call this in today?  Thanks   Mack Hook MD  December 20, 2010 8:33 AM   Done.  Pt.'s sister notified.  Sherian Maroon RN  December 20, 2010 5:25 PM

## 2010-12-30 ENCOUNTER — Telehealth (INDEPENDENT_AMBULATORY_CARE_PROVIDER_SITE_OTHER): Payer: Self-pay | Admitting: Internal Medicine

## 2010-12-30 ENCOUNTER — Ambulatory Visit: Payer: Medicaid Other | Admitting: Occupational Therapy

## 2010-12-30 IMAGING — CT CT ANGIO CHEST
1 of 3 series · 18 of 32 positions shown · IV contrast (APPLIED)
Comparison: Chest radiograph performed 04/19/2010

CLINICAL DATA: Shortness of breath; positive D-dimer.

CT ANGIOGRAPHY CHEST WITH CONTRAST
TECHNIQUE: Multidetector CT imaging of the chest was performed
using the standard protocol during bolus administration of
intravenous contrast.  Multiplanar CT image reconstructions
including MIPs were obtained to evaluate the vascular anatomy.
Contrast:  100 mL of Omnipaque 300 IV contrast

[Series 9: pulm embolism 1.0 b25f thins · axial · 0.67mm/px · z∈[-259,-2]mm · 18 of 289 slices shown]
[im 16/289  lung]
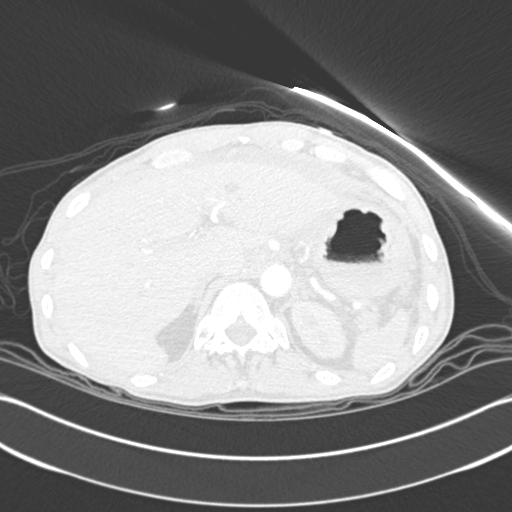
[im 31/289  mediastinal]
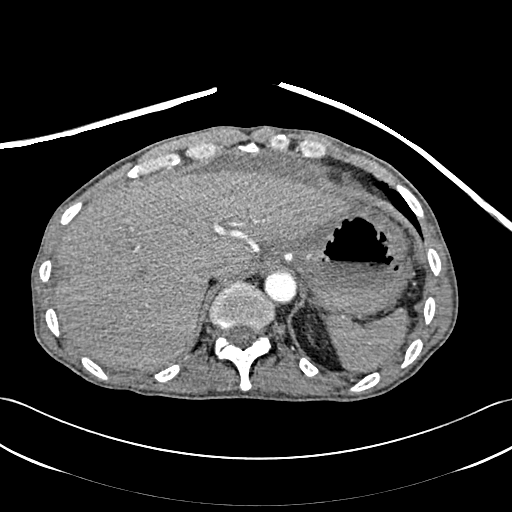
[im 46/289  lung]
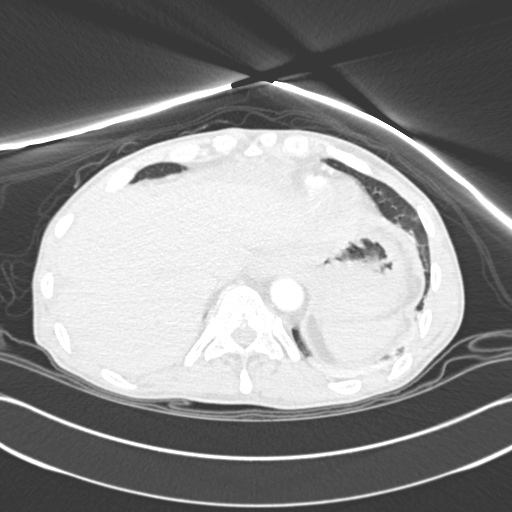
[im 76/289  mediastinal]
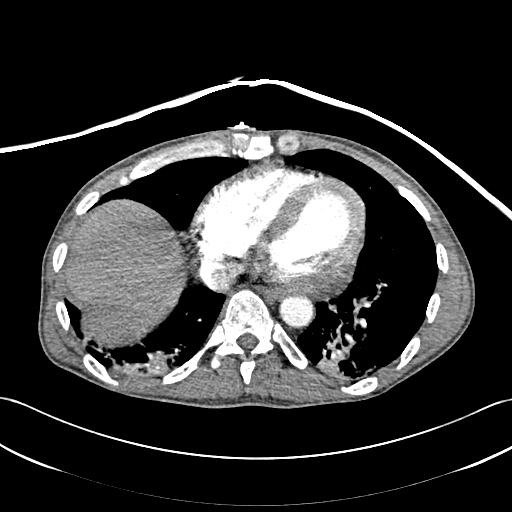
[im 91/289  lung]
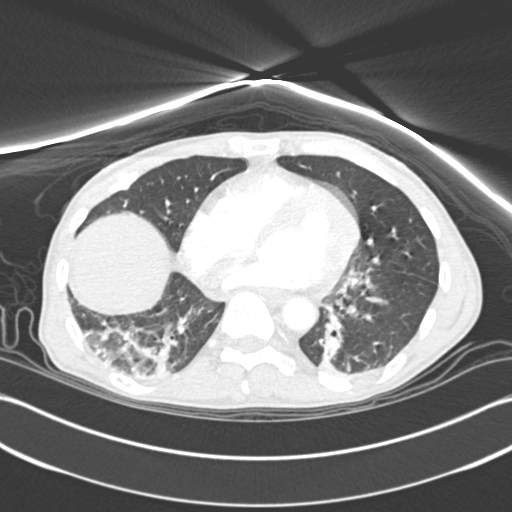
[im 97/289  mediastinal]
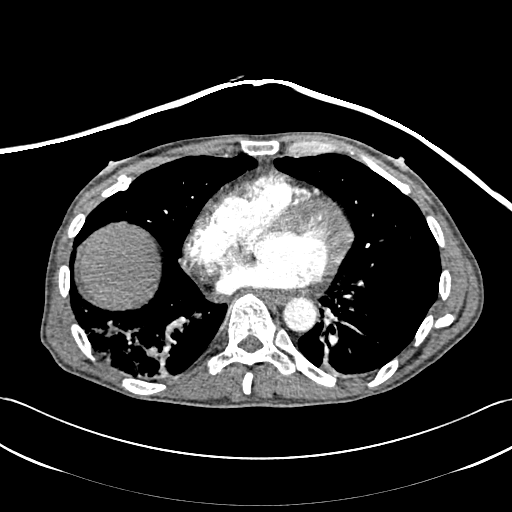
[im 107/289  lung]
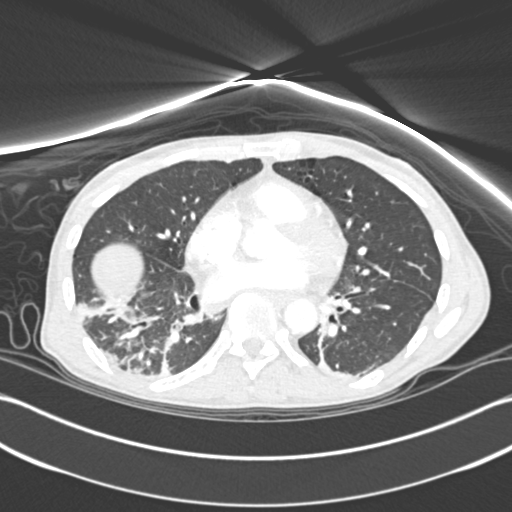
[im 122/289  mediastinal]
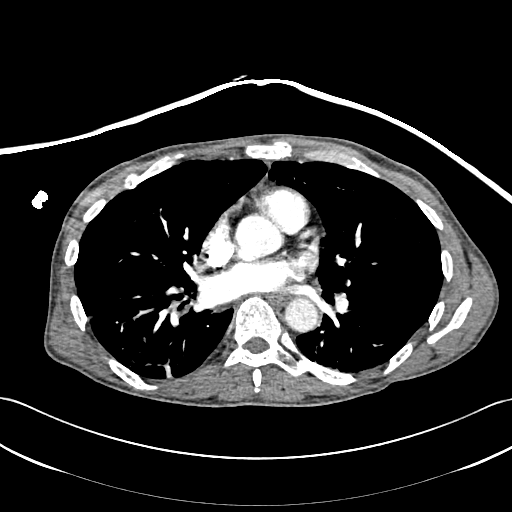
[im 135/289  lung]
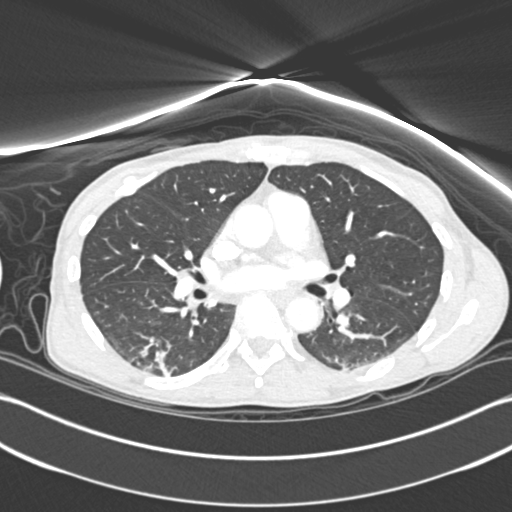
[im 152/289  mediastinal]
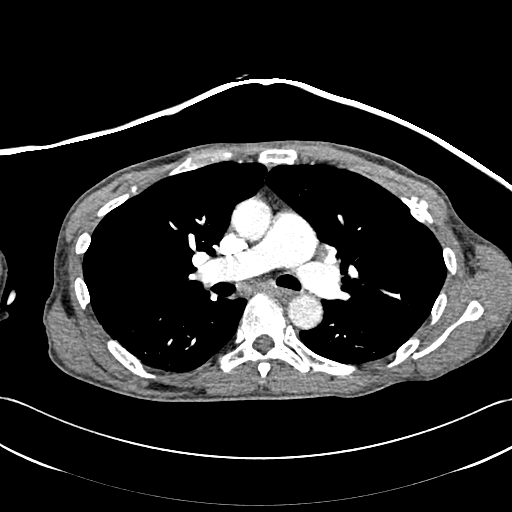
[im 167/289  lung]
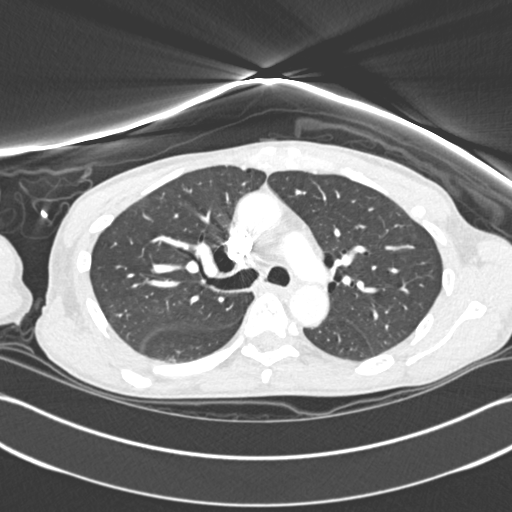
[im 182/289  mediastinal]
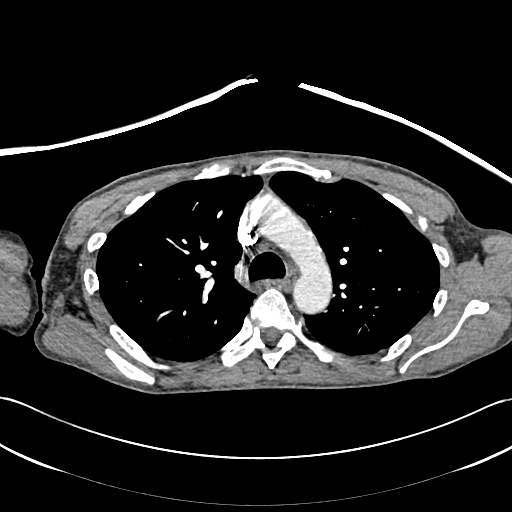
[im 193/289  lung]
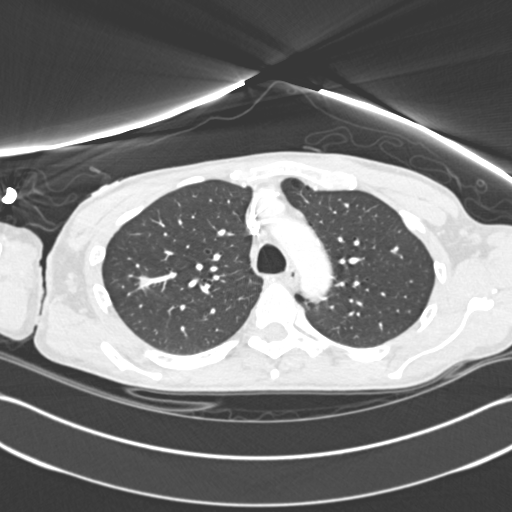
[im 198/289  mediastinal]
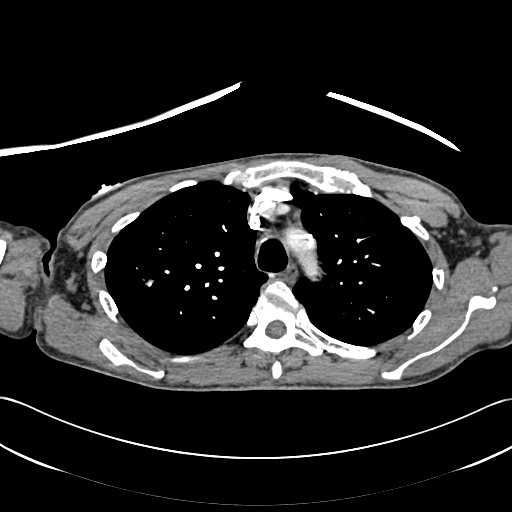
[im 213/289  lung]
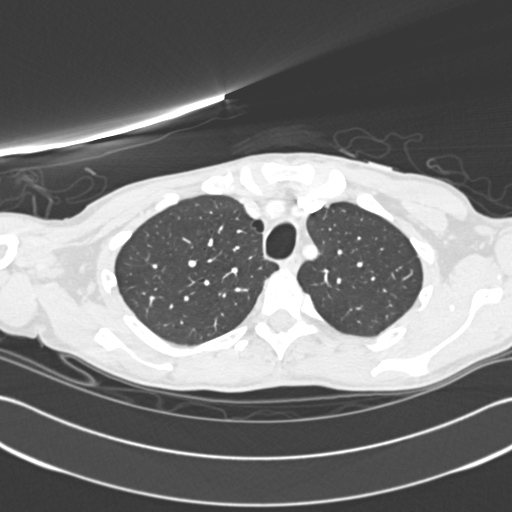
[im 243/289  mediastinal]
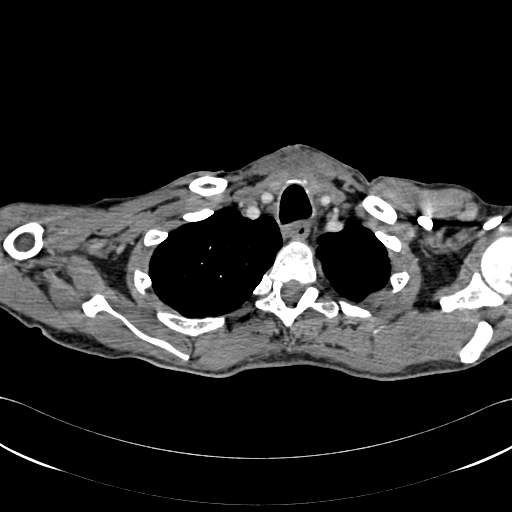
[im 258/289  lung]
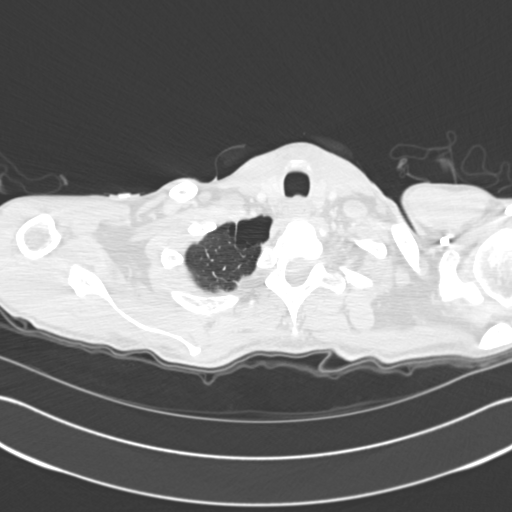
[im 273/289  mediastinal]
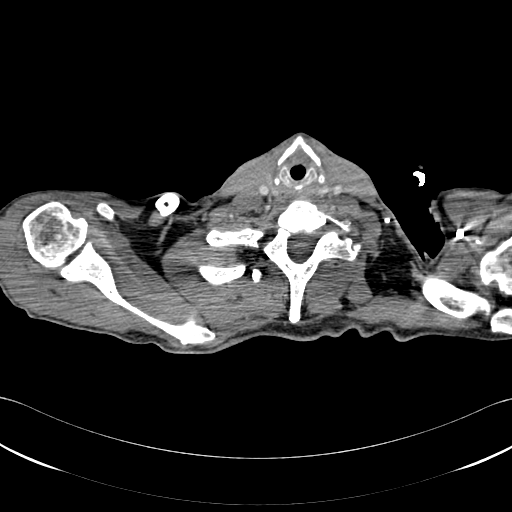

[18 of 32 positions shown; findings below may reference images not displayed]

FINDINGS: There is no evidence of significant pulmonary embolus.
Evaluation for pulmonary embolus is suboptimal in areas of airspace
consolidation.

There is patchy airspace consolidation within both lower lung
lobes, compatible with pneumonia.  There is no evidence of pleural
effusion or pneumothorax.  Within the right upper lobe, note is
made of a spiculated 8 mm nodular density straddling a vessel
bifurcation (image 33 of 97). Suggest treatment of the acute
process, and reevaluation with CT of the chest following treatment
to assess whether this reflects the acute infection.

Scattered blebs are seen at the lung apices.  The focal right mid
lung density identified on the recent chest x-ray reflects callus
formation about a healing anterior rib fracture.

The mediastinum is unremarkable in appearance.  There is no
evidence of mediastinal lymphadenopathy.  Trace pericardial fluid
remains within normal limits.  The great vessels are unremarkable
in appearance.  No axillary lymphadenopathy is seen.  The thyroid
gland is unremarkable in appearance.

Small volume abdominal ascites is noted.  Two small hypodense
lesions are noted within the left lobe of the liver, the larger of
which measures 1.0 cm in size; these likely reflect cysts.  The
visualized portions of the spleen are unremarkable.  The visualized
portions of the adrenal glands, kidneys and stomach are within
normal limits.

There is a healing oblique fracture involving the mid body of the
sternum, essentially nondisplaced, with overlying callus formation.
There are healing fractures of the right anterior 2nd through 5th
ribs, as well as of the left anterior 2nd through 5th ribs.

Review of the MIP images confirms the above findings.
IMPRESSION: 1.  No evidence of significant pulmonary embolus.
2.  Bilateral lower lobe patchy airspace consolidation, compatible
with multifocal pneumonia.
3.  8 mm spiculated nodular density straddling a vessel bifurcation
within the right upper lobe; suggest treatment of the acute
process, and reevaluation with CT of the chest following treatment
to assess whether this reflects the underlying acute infection.
4.  Small volume abdominal ascites noted.
5.  Scattered blebs seen at the lung apices.
6.  Likely small left hepatic cysts.
7.  Healing oblique fracture of the mid body of the sternum,
essentially nondisplaced; healing fractures of the right and left
anterior 2nd through 5th ribs.

## 2011-01-04 ENCOUNTER — Encounter (INDEPENDENT_AMBULATORY_CARE_PROVIDER_SITE_OTHER): Payer: Self-pay | Admitting: Internal Medicine

## 2011-01-04 ENCOUNTER — Encounter: Payer: Self-pay | Admitting: Internal Medicine

## 2011-01-04 DIAGNOSIS — F079 Unspecified personality and behavioral disorder due to known physiological condition: Secondary | ICD-10-CM | POA: Insufficient documentation

## 2011-01-06 NOTE — Miscellaneous (Signed)
Summary: Rehab Report/NEUROREHABILITATION  Rehab Report/NEUROREHABILITATION   Imported By: Roland Earl 12/29/2010 14:40:19  _____________________________________________________________________  External Attachment:    Type:   Image     Comment:   External Document

## 2011-01-11 NOTE — Assessment & Plan Note (Signed)
Summary: f/u arm muscle pain and spasms   Vital Signs:  Patient profile:   55 year old male Weight:      139.56 pounds Temp:     98.2 degrees F oral Pulse rate:   99 / minute Pulse rhythm:   regular Resp:     18 per minute BP sitting:   89 / 63  (left arm) Cuff size:   regular  Vitals Entered By: Aquilla Solian CMA (January 04, 2011 10:15 AM) CC: f/u on arm muscle pain and spapsm... States he feels better but still having pain and spasms.  Is Patient Diabetic? No Pain Assessment Patient in pain? yes       Does patient need assistance? Functional Status Self care Ambulation Normal   Primary Care Provider:  Richardson Dopp, PA-C  CC:  f/u on arm muscle pain and spapsm... States he feels better but still having pain and spasms. .  History of Present Illness: 1.  Muscle spasms of left arm:  improved with increase to 15 mg three times a day.  Worse time is first thing in morning when awakens--only time he is currently using Oxycodone.  2.  Since stroke, pt. also having difficulties maintaining long conversations--forgets what he is saying, though  I  have not noted this as a problem when in office. Also with blurred vision, though did have eyes checked recently and was told vision with glasses is fine.  Current Medications (verified): 1)  Creon 12000 Unit Cpep (Pancrelipase (Lip-Prot-Amyl)) .... Once Daily 2)  Levetiracetam 1000 Mg Tabs (Levetiracetam) .... 1/2 Tab By Mouth in Morning and 1 Tab By Mouth in Evening. 3)  Ativan 1 Mg Tabs (Lorazepam) .... Take One By Mouth No More Than Every 8 Hours As Needed For Anxiety or Agitation 4)  Vitamin B-1 100 Mg Tabs (Thiamine Hcl) .... Take 1 Tablet By Mouth Once A Day 5)  Carafate 1 Gm Tabs (Sucralfate) .... Three Times A Day 6)  Nexium 40 Mg Cpdr (Esomeprazole Magnesium) .Marland Kitchen.. 1 Capsule By Mouth Once A Day For Stomach 7)  Zofran 4 Mg Tabs (Ondansetron Hcl) .... As Needed For Nausea 8)  Folic Acid 1 Mg Tabs (Folic Acid) .... Take 1 Tablet By  Mouth Once A Day 9)  Multivitamins  Tabs (Multiple Vitamin) .... Take 1 Tablet By Mouth Once A Day - Get This Over The Counter 10)  Nitrostat 0.4 Mg Subl (Nitroglycerin) .Marland Kitchen.. 1 Sublingual As Needed Chest Pain 11)  Vitamin B-12 1000 Mcg Tabs (Cyanocobalamin) .... Take 1 Tablet By Mouth Once A Day 12)  Oxycodone Hcl 10 Mg Tabs (Oxycodone Hcl) .... Once Daily 13)  Carvedilol 3.125 Mg Tabs (Carvedilol) .... Take One Tablet By Mouth Twice A Day 14)  Ferrex 150 150 Mg Caps (Polysaccharide Iron Complex) .Marland Kitchen.. 1 Cap By Mouth Two Times A Day 15)  Baclofen 10 Mg Tabs (Baclofen) .Marland Kitchen.. 1 1/2 Tabs By Mouth Three Times A Day For Right Arm Muscle Spasm--May Fill Only Every 30 Days  Allergies (verified): 1)  ! Lisinopril 2)  ! * Azithromycin  Physical Exam  Neurologic:  Intermittent spasms of right arm--possibly less evident.  Hand not held in as strong of wrist flexion and finger extension.   Impression & Recommendations:  Problem # 1:  ARM PAIN, RIGHT (ICD-729.5) Secondary to muscle spasms Increase Baclofen to 20 mg three times a day --if problems with side effects, will decrease morning and afternoon dose, but keep bedtime dose t 20 mg.  Complete Medication List:  1)  Creon 12000 Unit Cpep (Pancrelipase (lip-prot-amyl)) .... Once daily 2)  Levetiracetam 1000 Mg Tabs (Levetiracetam) .... 1/2 tab by mouth in morning and 1 tab by mouth in evening. 3)  Ativan 1 Mg Tabs (Lorazepam) .... Take one by mouth no more than every 8 hours as needed for anxiety or agitation 4)  Vitamin B-1 100 Mg Tabs (Thiamine hcl) .... Take 1 tablet by mouth once a day 5)  Carafate 1 Gm Tabs (Sucralfate) .... Three times a day 6)  Nexium 40 Mg Cpdr (Esomeprazole magnesium) .Marland Kitchen.. 1 capsule by mouth once a day for stomach 7)  Zofran 4 Mg Tabs (Ondansetron hcl) .... As needed for nausea 8)  Folic Acid 1 Mg Tabs (Folic acid) .... Take 1 tablet by mouth once a day 9)  Multivitamins Tabs (Multiple vitamin) .... Take 1 tablet by mouth  once a day - get this over the counter 10)  Nitrostat 0.4 Mg Subl (Nitroglycerin) .Marland Kitchen.. 1 sublingual as needed chest pain 11)  Vitamin B-12 1000 Mcg Tabs (Cyanocobalamin) .... Take 1 tablet by mouth once a day 12)  Oxycodone Hcl 10 Mg Tabs (Oxycodone hcl) .... Once daily 13)  Carvedilol 3.125 Mg Tabs (Carvedilol) .... Take one tablet by mouth twice a day 14)  Ferrex 150 150 Mg Caps (Polysaccharide iron complex) .Marland Kitchen.. 1 cap by mouth two times a day 15)  Baclofen 10 Mg Tabs (Baclofen) .... 2 tabs by mouth three times a day for right arm muscle spasm--may fill only every 30 days  Other Orders: Occupational Therapy (OT)  Patient Instructions: 1)  Follow up with Dr. Amil Amen in 3 months -- right arm pain Prescriptions: OXYCODONE HCL 10 MG TABS (OXYCODONE HCL) once daily  #20 x 0   Entered and Authorized by:   Mack Hook MD   Signed by:   Mack Hook MD on 01/04/2011   Method used:   Print then Give to Patient   RxID:   [id number] BACLOFEN 10 MG TABS (BACLOFEN) 2 tabs by mouth three times a day for right arm muscle spasm--may fill only every 30 days  #180 x 4   Entered and Authorized by:   Mack Hook MD   Signed by:   Mack Hook MD on 01/04/2011   Method used:   Print then Give to Patient   RxID:   [id number]    Orders Added: 1)  Occupational Therapy [OT] 2)  Est. Patient Level III [id number]  Appended Document: f/u arm muscle pain and spasms No seizures since going down on Keppra. Pt. 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

## 2011-01-11 NOTE — Progress Notes (Signed)
Summary: MED REFILL  Phone Note Call from Patient Call back at Home Phone [phone]   Reason for Call: Refill Medication Summary of Call: PT CALLED TO SAID HE IS OUT OF HIS OXYCODONE. Initial call taken by: Daralene Milch,  December 30, 2010 3:14 PM  Follow-up for Phone Call        Here today. Follow-up by: Mack Hook MD,  January 04, 2011 10:47 AM

## 2011-01-12 ENCOUNTER — Encounter: Payer: Medicaid Other | Admitting: Occupational Therapy

## 2011-01-19 ENCOUNTER — Encounter: Payer: Medicaid Other | Admitting: Occupational Therapy

## 2011-01-20 LAB — CBC
HCT: 33.5 % — ABNORMAL LOW (ref 39.0–52.0)
HCT: 33.6 % — ABNORMAL LOW (ref 39.0–52.0)
HCT: 34.7 % — ABNORMAL LOW (ref 39.0–52.0)
HCT: 38.2 % — ABNORMAL LOW (ref 39.0–52.0)
HCT: 43.4 % (ref 39.0–52.0)
Hemoglobin: 11.3 g/dL — ABNORMAL LOW (ref 13.0–17.0)
Hemoglobin: 11.3 g/dL — ABNORMAL LOW (ref 13.0–17.0)
Hemoglobin: 11.6 g/dL — ABNORMAL LOW (ref 13.0–17.0)
Hemoglobin: 12.6 g/dL — ABNORMAL LOW (ref 13.0–17.0)
Hemoglobin: 14.2 g/dL (ref 13.0–17.0)
MCHC: 32.7 g/dL (ref 30.0–36.0)
MCHC: 32.9 g/dL (ref 30.0–36.0)
MCHC: 33.5 g/dL (ref 30.0–36.0)
MCHC: 33.5 g/dL (ref 30.0–36.0)
MCHC: 33.7 g/dL (ref 30.0–36.0)
MCV: 101 fL — ABNORMAL HIGH (ref 78.0–100.0)
MCV: 101.4 fL — ABNORMAL HIGH (ref 78.0–100.0)
MCV: 101.8 fL — ABNORMAL HIGH (ref 78.0–100.0)
MCV: 102.3 fL — ABNORMAL HIGH (ref 78.0–100.0)
MCV: 102.9 fL — ABNORMAL HIGH (ref 78.0–100.0)
Platelets: 129 10*3/uL — ABNORMAL LOW (ref 150–400)
Platelets: 143 10*3/uL — ABNORMAL LOW (ref 150–400)
Platelets: 149 10*3/uL — ABNORMAL LOW (ref 150–400)
Platelets: 169 10*3/uL (ref 150–400)
Platelets: 185 10*3/uL (ref 150–400)
RBC: 3.3 MIL/uL — ABNORMAL LOW (ref 4.22–5.81)
RBC: 3.32 MIL/uL — ABNORMAL LOW (ref 4.22–5.81)
RBC: 3.42 MIL/uL — ABNORMAL LOW (ref 4.22–5.81)
RBC: 3.73 MIL/uL — ABNORMAL LOW (ref 4.22–5.81)
RBC: 4.22 MIL/uL (ref 4.22–5.81)
RDW: 14.1 % (ref 11.5–15.5)
RDW: 14.2 % (ref 11.5–15.5)
RDW: 14.3 % (ref 11.5–15.5)
RDW: 14.4 % (ref 11.5–15.5)
RDW: 14.6 % (ref 11.5–15.5)
WBC: 10 10*3/uL (ref 4.0–10.5)
WBC: 10.2 10*3/uL (ref 4.0–10.5)
WBC: 11.2 10*3/uL — ABNORMAL HIGH (ref 4.0–10.5)
WBC: 11.9 10*3/uL — ABNORMAL HIGH (ref 4.0–10.5)
WBC: 13.5 10*3/uL — ABNORMAL HIGH (ref 4.0–10.5)

## 2011-01-20 LAB — IRON AND TIBC
Iron: 14 ug/dL — ABNORMAL LOW (ref 42–135)
Saturation Ratios: 8 % — ABNORMAL LOW (ref 20–55)
TIBC: 184 ug/dL — ABNORMAL LOW (ref 215–435)
UIBC: 170 ug/dL

## 2011-01-20 LAB — COMPREHENSIVE METABOLIC PANEL
ALT: 31 U/L (ref 0–53)
ALT: 53 U/L (ref 0–53)
ALT: 76 U/L — ABNORMAL HIGH (ref 0–53)
AST: 24 U/L (ref 0–37)
AST: 45 U/L — ABNORMAL HIGH (ref 0–37)
AST: 92 U/L — ABNORMAL HIGH (ref 0–37)
Albumin: 2.7 g/dL — ABNORMAL LOW (ref 3.5–5.2)
Albumin: 3.2 g/dL — ABNORMAL LOW (ref 3.5–5.2)
Albumin: 3.8 g/dL (ref 3.5–5.2)
Alkaline Phosphatase: 50 U/L (ref 39–117)
Alkaline Phosphatase: 61 U/L (ref 39–117)
Alkaline Phosphatase: 76 U/L (ref 39–117)
BUN: 3 mg/dL — ABNORMAL LOW (ref 6–23)
BUN: 6 mg/dL (ref 6–23)
BUN: 8 mg/dL (ref 6–23)
CO2: 20 mEq/L (ref 19–32)
CO2: 24 mEq/L (ref 19–32)
CO2: 25 mEq/L (ref 19–32)
Calcium: 8.1 mg/dL — ABNORMAL LOW (ref 8.4–10.5)
Calcium: 8.7 mg/dL (ref 8.4–10.5)
Calcium: 9.3 mg/dL (ref 8.4–10.5)
Chloride: 102 mEq/L (ref 96–112)
Chloride: 104 mEq/L (ref 96–112)
Chloride: 105 mEq/L (ref 96–112)
Creatinine, Ser: 0.82 mg/dL (ref 0.4–1.5)
Creatinine, Ser: 0.85 mg/dL (ref 0.4–1.5)
Creatinine, Ser: 0.9 mg/dL (ref 0.4–1.5)
GFR calc Af Amer: >60 mL/min (ref >60)
GFR calc Af Amer: >60 mL/min (ref >60)
GFR calc Af Amer: >60 mL/min (ref >60)
GFR calc non Af Amer: >60 mL/min (ref >60)
GFR calc non Af Amer: >60 mL/min (ref >60)
GFR calc non Af Amer: >60 mL/min (ref >60)
Glucose, Bld: 110 mg/dL — ABNORMAL HIGH (ref 70–99)
Glucose, Bld: 133 mg/dL — ABNORMAL HIGH (ref 70–99)
Glucose, Bld: 166 mg/dL — ABNORMAL HIGH (ref 70–99)
Potassium: 3.2 mEq/L — ABNORMAL LOW (ref 3.5–5.1)
Potassium: 3.2 mEq/L — ABNORMAL LOW (ref 3.5–5.1)
Potassium: 4.2 mEq/L (ref 3.5–5.1)
Sodium: 132 mEq/L — ABNORMAL LOW (ref 135–145)
Sodium: 135 mEq/L (ref 135–145)
Sodium: 140 mEq/L (ref 135–145)
Total Bilirubin: 0.6 mg/dL (ref 0.3–1.2)
Total Bilirubin: 1.5 mg/dL — ABNORMAL HIGH (ref 0.3–1.2)
Total Bilirubin: 1.6 mg/dL — ABNORMAL HIGH (ref 0.3–1.2)
Total Protein: 5.5 g/dL — ABNORMAL LOW (ref 6.0–8.3)
Total Protein: 6.1 g/dL (ref 6.0–8.3)
Total Protein: 7.1 g/dL (ref 6.0–8.3)

## 2011-01-20 LAB — RAPID URINE DRUG SCREEN, HOSP PERFORMED
Amphetamines: NOT DETECTED
Barbiturates: NOT DETECTED
Benzodiazepines: NOT DETECTED
Cocaine: NOT DETECTED
Opiates: POSITIVE — AB
Tetrahydrocannabinol: NOT DETECTED

## 2011-01-20 LAB — POCT I-STAT 3, ART BLOOD GAS (G3+)
Bicarbonate: 24.6 mEq/L — ABNORMAL HIGH (ref 20.0–24.0)
O2 Saturation: 96 %
Patient temperature: 98.6
TCO2: 26 mmol/L (ref 0–100)
pCO2 arterial: 38.1 mmHg (ref 35.0–45.0)
pH, Arterial: 7.419 (ref 7.350–7.450)
pO2, Arterial: 83 mmHg (ref 80.0–100.0)

## 2011-01-20 LAB — CULTURE, BLOOD (ROUTINE X 2)
Culture: NO GROWTH
Culture: NO GROWTH

## 2011-01-20 LAB — BASIC METABOLIC PANEL
BUN: 3 mg/dL — ABNORMAL LOW (ref 6–23)
BUN: 5 mg/dL — ABNORMAL LOW (ref 6–23)
BUN: 6 mg/dL (ref 6–23)
CO2: 20 mEq/L (ref 19–32)
CO2: 21 mEq/L (ref 19–32)
CO2: 22 mEq/L (ref 19–32)
Calcium: 8.1 mg/dL — ABNORMAL LOW (ref 8.4–10.5)
Calcium: 8.5 mg/dL (ref 8.4–10.5)
Calcium: 9 mg/dL (ref 8.4–10.5)
Chloride: 101 mEq/L (ref 96–112)
Chloride: 102 mEq/L (ref 96–112)
Chloride: 105 mEq/L (ref 96–112)
Creatinine, Ser: 0.9 mg/dL (ref 0.4–1.5)
Creatinine, Ser: 0.97 mg/dL (ref 0.4–1.5)
Creatinine, Ser: 0.99 mg/dL (ref 0.4–1.5)
GFR calc Af Amer: >60 mL/min (ref >60)
GFR calc Af Amer: >60 mL/min (ref >60)
GFR calc Af Amer: >60 mL/min (ref >60)
GFR calc non Af Amer: >60 mL/min (ref >60)
GFR calc non Af Amer: >60 mL/min (ref >60)
GFR calc non Af Amer: >60 mL/min (ref >60)
Glucose, Bld: 113 mg/dL — ABNORMAL HIGH (ref 70–99)
Glucose, Bld: 229 mg/dL — ABNORMAL HIGH (ref 70–99)
Glucose, Bld: 90 mg/dL (ref 70–99)
Potassium: 3.4 mEq/L — ABNORMAL LOW (ref 3.5–5.1)
Potassium: 3.4 mEq/L — ABNORMAL LOW (ref 3.5–5.1)
Potassium: 4.3 mEq/L (ref 3.5–5.1)
Sodium: 133 mEq/L — ABNORMAL LOW (ref 135–145)
Sodium: 133 mEq/L — ABNORMAL LOW (ref 135–145)
Sodium: 136 mEq/L (ref 135–145)

## 2011-01-20 LAB — URINALYSIS, ROUTINE W REFLEX MICROSCOPIC
Bilirubin Urine: NEGATIVE
Glucose, UA: >1000 mg/dL — AB
Hgb urine dipstick: NEGATIVE
Ketones, ur: 15 mg/dL — AB
Leukocytes, UA: NEGATIVE
Nitrite: NEGATIVE
Protein, ur: 30 mg/dL — AB
Specific Gravity, Urine: 1.031 — ABNORMAL HIGH (ref 1.005–1.030)
Urobilinogen, UA: 0.2 mg/dL (ref 0.0–1.0)
pH: 5.5 (ref 5.0–8.0)

## 2011-01-20 LAB — DIFFERENTIAL
Basophils Absolute: 0 10*3/uL (ref 0.0–0.1)
Basophils Absolute: 0 10*3/uL (ref 0.0–0.1)
Basophils Relative: 0 % (ref 0–1)
Basophils Relative: 0 % (ref 0–1)
Eosinophils Absolute: 0 10*3/uL (ref 0.0–0.7)
Eosinophils Absolute: 0.1 10*3/uL (ref 0.0–0.7)
Eosinophils Relative: 0 % (ref 0–5)
Eosinophils Relative: 1 % (ref 0–5)
Lymphocytes Relative: 26 % (ref 12–46)
Lymphocytes Relative: 7 % — ABNORMAL LOW (ref 12–46)
Lymphs Abs: 1 10*3/uL (ref 0.7–4.0)
Lymphs Abs: 2.6 10*3/uL (ref 0.7–4.0)
Monocytes Absolute: 0.3 10*3/uL (ref 0.1–1.0)
Monocytes Absolute: 0.6 10*3/uL (ref 0.1–1.0)
Monocytes Relative: 2 % — ABNORMAL LOW (ref 3–12)
Monocytes Relative: 6 % (ref 3–12)
Neutro Abs: 12.2 10*3/uL — ABNORMAL HIGH (ref 1.7–7.7)
Neutro Abs: 6.9 10*3/uL (ref 1.7–7.7)
Neutrophils Relative %: 68 % (ref 43–77)
Neutrophils Relative %: 90 % — ABNORMAL HIGH (ref 43–77)

## 2011-01-20 LAB — OSMOLALITY: Osmolality: 276 mOsm/kg (ref 275–300)

## 2011-01-20 LAB — GLUCOSE, CAPILLARY
Glucose-Capillary: 103 mg/dL — ABNORMAL HIGH (ref 70–99)
Glucose-Capillary: 115 mg/dL — ABNORMAL HIGH (ref 70–99)
Glucose-Capillary: 117 mg/dL — ABNORMAL HIGH (ref 70–99)
Glucose-Capillary: 117 mg/dL — ABNORMAL HIGH (ref 70–99)
Glucose-Capillary: 126 mg/dL — ABNORMAL HIGH (ref 70–99)
Glucose-Capillary: 126 mg/dL — ABNORMAL HIGH (ref 70–99)
Glucose-Capillary: 127 mg/dL — ABNORMAL HIGH (ref 70–99)
Glucose-Capillary: 136 mg/dL — ABNORMAL HIGH (ref 70–99)
Glucose-Capillary: 147 mg/dL — ABNORMAL HIGH (ref 70–99)
Glucose-Capillary: 157 mg/dL — ABNORMAL HIGH (ref 70–99)
Glucose-Capillary: 98 mg/dL (ref 70–99)
Glucose-Capillary: 98 mg/dL (ref 70–99)

## 2011-01-20 LAB — LIPID PANEL
Cholesterol: 169 mg/dL (ref 0–200)
HDL: 63 mg/dL (ref >39)
LDL Cholesterol: 93 mg/dL (ref 0–99)
Total CHOL/HDL Ratio: 2.7 RATIO
Triglycerides: 65 mg/dL (ref <150)
VLDL: 13 mg/dL (ref 0–40)

## 2011-01-20 LAB — LACTIC ACID, PLASMA: Lactic Acid, Venous: 3.3 mmol/L — ABNORMAL HIGH (ref 0.5–2.2)

## 2011-01-20 LAB — CARDIAC PANEL(CRET KIN+CKTOT+MB+TROPI)
CK, MB: 0.7 ng/mL (ref 0.3–4.0)
CK, MB: 1 ng/mL (ref 0.3–4.0)
Relative Index: 1 (ref 0.0–2.5)
Relative Index: INVALID (ref 0.0–2.5)
Total CK: 103 U/L (ref 7–232)
Total CK: 94 U/L (ref 7–232)
Troponin I: <0.01 ng/mL (ref 0.00–0.06)
Troponin I: <0.01 ng/mL (ref 0.00–0.06)

## 2011-01-20 LAB — URINE MICROSCOPIC-ADD ON

## 2011-01-20 LAB — TROPONIN I: Troponin I: <0.01 ng/mL (ref 0.00–0.06)

## 2011-01-20 LAB — POCT CARDIAC MARKERS
CKMB, poc: <1 ng/mL — ABNORMAL LOW (ref 1.0–8.0)
CKMB, poc: <1 ng/mL — ABNORMAL LOW (ref 1.0–8.0)
Myoglobin, poc: 34.5 ng/mL (ref 12–200)
Myoglobin, poc: 41.3 ng/mL (ref 12–200)
Troponin i, poc: <0.05 ng/mL (ref 0.00–0.09)
Troponin i, poc: <0.05 ng/mL (ref 0.00–0.09)

## 2011-01-20 LAB — LACTATE DEHYDROGENASE: LDH: 185 U/L (ref 94–250)

## 2011-01-20 LAB — URINE CULTURE
Colony Count: NO GROWTH
Culture: NO GROWTH

## 2011-01-20 LAB — RETICULOCYTES
RBC.: 3.78 MIL/uL — ABNORMAL LOW (ref 4.22–5.81)
Retic Count, Absolute: 90.7 10*3/uL (ref 19.0–186.0)
Retic Ct Pct: 2.4 % (ref 0.4–3.1)

## 2011-01-20 LAB — ACETAMINOPHEN LEVEL: Acetaminophen (Tylenol), Serum: <10 ug/mL — ABNORMAL LOW (ref 10–30)

## 2011-01-20 LAB — HEMOGLOBIN A1C
Hgb A1c MFr Bld: 5 % (ref 4.6–6.1)
Mean Plasma Glucose: 97 mg/dL

## 2011-01-20 LAB — SALICYLATE LEVEL: Salicylate Lvl: <4 mg/dL (ref 2.8–20.0)

## 2011-01-20 LAB — TSH: TSH: 2.137 u[IU]/mL (ref 0.350–4.500)

## 2011-01-20 LAB — VITAMIN B12
Vitamin B-12: 336 pg/mL (ref 211–911)
Vitamin B-12: 771 pg/mL (ref 211–911)

## 2011-01-20 LAB — FERRITIN: Ferritin: 610 ng/mL — ABNORMAL HIGH (ref 22–322)

## 2011-01-20 LAB — CK TOTAL AND CKMB (NOT AT ARMC)
CK, MB: 0.8 ng/mL (ref 0.3–4.0)
Relative Index: INVALID (ref 0.0–2.5)
Total CK: 88 U/L (ref 7–232)

## 2011-01-20 LAB — FOLATE
Folate: 6.7 ng/mL
Folate: >20 ng/mL

## 2011-01-20 LAB — TRICYCLICS SCREEN, URINE: TCA Scrn: NOT DETECTED

## 2011-01-20 LAB — ETHANOL
Alcohol, Ethyl (B): 96 mg/dL — ABNORMAL HIGH (ref 0–10)
Alcohol, Ethyl (B): <5 mg/dL (ref 0–10)

## 2011-01-20 LAB — MAGNESIUM
Magnesium: 1.7 mg/dL (ref 1.5–2.5)
Magnesium: 1.9 mg/dL (ref 1.5–2.5)

## 2011-01-20 LAB — LIPASE, BLOOD: Lipase: 634 U/L — ABNORMAL HIGH (ref 11–59)

## 2011-01-26 ENCOUNTER — Encounter: Payer: Medicaid Other | Admitting: Occupational Therapy

## 2011-02-22 NOTE — H&P (Signed)
NAME:  Steven Frey, Steven Frey NO.:  [id number]   MEDICAL RECORD NO.:  [id number]          PATIENT TYPE:  INP   LOCATION:  [id number]                         FACILITY:  [facility]   PHYSICIAN:  Blane Ohara McDiarmid, M.D.DATE OF BIRTH:  [birth year]   DATE OF ADMISSION:  07/28/2007  DATE OF DISCHARGE:                              HISTORY & PHYSICAL   CHIEF COMPLAINT:  Abdominal pain.   PRIMARY CARE PHYSICIAN:  None.   HISTORY OF PRESENT ILLNESS:  This is a 55 year old male with no  significant past medical history presenting with acute onset of  abdominal pain at 1430 today.  Pain was 15/10 and accompanied by  nonbloody nonbilious emesis x4.  The patient called EMS at 1700 and was  brought to the emergency department.  He had no relief from Pepto-Bismol  at home.  He has had no previous similar history, but reports eight days  of gastroenteritis two weeks ago with nausea, diarrhea, malaise and  fatigue.  He had an alcohol level of 280 in the emergency room  department and endorses two drinks daily as well as some additional beer  today.  Labs in the emergency department were significant for a lipase  of 2273 and proteinuria on urinalysis.  He is also borderline anemic  with an H&H of 12.8 and 39.5.  He was given Phenergan, folate, thiamine,  Zofran and Dilaudid in the emergency department and was bolused with 500  ml of if IV fluids in the emergency department.  The patient points to  his right lower quadrant and left upper quadrant as the source of pain  in his abdomen.   PAST MEDICAL HISTORY:  Broken wrist in 1975.   FAMILY HISTORY:  Mother deceased with alcoholism.   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  He lives with his father and his daughters.  He smokes 3/4 of a pack of  cigarettes per day and endorses two shots of liquor per day.  He denies  drug use.   MEDICATIONS:  None.   ALLERGIES:  No known drug allergies.   REVIEW OF SYSTEMS:   Negative for headache, vision changes and chest  pain.  Positive for decreased appetite, fever, chills, shortness of  breath, urinary urgency and dysuria x6 weeks.   PHYSICAL EXAMINATION:  VITAL SIGNS:  Temperature 96.8, heart rate 73,  respiratory rate 16, blood pressure 109/67, O2 saturation 100% on room  air.  GENERAL:  Alert and oriented x3, mild distress.  CARDIO:  Regular rate and rhythm with a 2/6 systolic ejection murmur.  PULMONARY:  Clear to auscultation bilateral with normal work of  breathing.  ABDOMINAL:  Decreased bowel sound.  Rigid, diffusely tender.  No  guarding.  Nondistended.  EXTREMITIES:  No edema.  Nontender.  NEURO:  No tremor.  Extraocular muscles intact.  Pupils equally round  and reactive to light.  HEENT:  Dry mucous membranes.  No scleral icterus.   LABS:  Urinalysis significant for a specific gravity of 1.018 and  protein of 30.  Lipase 2273.  Alcohol level 280.  BMP:  Sodium 144,  potassium 3.2, chloride 109, bicarb 27, BUN 5, creatinine 0.94, glucose  105, calcium 8.7.  Total bilirubin 0.5, alkaline phosphatase 52, AST 60,  ALT 43, total protein 6.4, albumin 3.3.  CBC:  White blood count 14.3,  hemoglobin 12.8, hematocrit 39.5, platelets 264, absolute neutrophil  count 10.1, absolute leukocyte count 3.3, absolute monocyte count 0.8.   ASSESSMENT/PLAN:  This is a 55 year old male with acute abdominal pain.   1. Abdominal pain.  Elevated lipase and acute onset are consistent      with acute pancreatitis.  May be minimizing alcohol intake and      etiology is alcoholic versus gallstones.  We will evaluate with      ultrasound.  His zero-hour Ranson score is zero, though the LDH      score is pending.  We will treat with IV fluids and IV pain      medicines as well as IV antiemetics.  Patient will remain n.p.o.      and also be given proton pump inhibitor.  2. Alcohol abuse.  Some concern with withdrawal, but CIWA score is 4.      We will start  withdrawal protocol in the morning and monitor for      symptoms.  Urine drug screen will be drawn now.  3. Tobacco abuse.  Patient will be given a 14 mg Nicotine patch to      prevent withdrawal symptoms.  4. Fluids, electrolytes, nutrition/gastrointestinal:  A 500 ml bolus      of normal saline now followed by D5 half-normal saline with 20 KCL      at 150.  5. Disposition.  Possibly home tomorrow pending ultrasound and pain      control, as well as resolution of nausea and vomiting and good p.o.      intake.      Graciella Belton, MD  Electronically Signed      Blane Ohara McDiarmid, M.D.  Electronically Signed    MO/MEDQ  D:  07/28/2007  T:  07/29/2007  Job:  [id number]

## 2011-02-22 NOTE — Discharge Summary (Signed)
NAME:  Steven Frey, WAKLEY NO.:  [id number]   MEDICAL RECORD NO.:  [id number]          PATIENT TYPE:  INP   LOCATION:  5122                         FACILITY:  [facility]   PHYSICIAN:  Jamal Collin. Hensel, M.D.DATE OF BIRTH:  09-15-1956   DATE OF ADMISSION:  07/28/2007  DATE OF DISCHARGE:  08/01/2007                               DISCHARGE SUMMARY   PRIMARY CARE Mrk Buzby:  Graciella Belton, MD   DISCHARGE DIAGNOSES:  1. Pancreatitis acute.  2. Alcohol withdrawal.   DISCHARGE MEDICATIONS:  He was discharged on day 4 of 5 of his Ativan  taper for alcohol withdrawal.  He has 1 remaining dose of Ativan 1 mg to  be taken in the morning on August 02, 2007, that is 1 mg Ativan times 1  on the morning of August 02, 2007.   CONSULTATIONS:  None.   PROCEDURE:  1. Abdominal ultrasound done on July 28, 2007.  2. X-ray of the abdomen July 28, 2007.  3. CT of the abdomen July 29, 2007.   LABORATORY DATA:  On July 28, 2007, his alcohol level was 280 and his  urinalysis showed 30 protein.  Lipids: Triglycerides 45, HDL 101, LDL  83.  LDH on July 28, 2007, 173, on July 30, 2007, 191.  Lipase on  July 28, 2007, 273, on July 31, 2007, 71.  Amylase on July 28, 2007, 1767, on July 30, 2007, 595.  On July 31, 2007, 269.  BMET  on August 01, 2007, sodium 138, potassium 3.8, chloride 101, bicarb 26,  BUN 5, creatinine 1.04, glucose 83, calcium 9.5.   BRIEF HOSPITAL COURSE:  This is a 55 year old male with acute  pancreatitis secondary to alcohol intoxication admitted on July 28, 2007.  1. Pancreatitis.  He was admitted with acute pancreatitis and a zero      hour ransom score of 0.  He was treated with IV fluids and IV pain      medicines as well as IV antiemetics.  He remained n.p.o. and we      advanced his diet throughout his admission.  On discharge he was      taking full solid diet with no abdominal pain reported.  Ultrasound      of the abdomen  showed peripancreatic and perihepatic fluids and his      follow-up CT of the abdomen revealed that there were no gallstones      or choledocholithiasis, however, there was diffuse pancreatic edema      and focal pancreatic necrosis.  2. Alcohol abuse.  The patient was placed on an Ativan protocol      beginning on July 29, 2007.  On the evening of July 29, 2007,      and the morning of July 30, 2007, he had period of confusion      that were consistent with delirium.  He would wax and wane and      occasionally be lucid but frequently was disoriented and wandering      the halls.  He required physical restraints at 1 point.  On the day  of discharge he is lucid and alert and oriented times 3. I examined      him several times on August 01, 2007, and he showed no further      signs of delirium at this point.  Of note he is on day 4 of 5 of      his Ativan taper and is due for 1 more dose of Ativan tomorrow      morning.  I have written him a prescription for this final dose to      be taken as an outpatient.  Also he was for social work consult to      discuss alcohol rehab.  Social work tried to visit him yesterday      but found that he was not a good candidate for discussion yesterday      secondary to delirium.  They visited him on the day of discharge to      discuss rehab options.  3. Macrocytic anemia.  He was fecal occult blood test positive on day      1 of admission.  This is likely due to his alcohol intake or      secondary to a folate or B12 deficiency, however, the fecal occult      blood test remained positive, it is also worrisome for GI bleeding.      There is no frank blood but this should be followed as an      outpatient.  4. Tobacco abuse.  The patient was given a 14 mg patch daily of      nicotine to prevent tobacco withdrawal symptoms.   DISCHARGE INSTRUCTIONS:  The patient is discharged without restrictions  on diet or work or activity.  He is to  follow up with Dr. Graciella Belton  at [facility] on August 21, 2007, at 2 p.m. and  is encouraged to seek outpatient alcohol rehab programs.   The patient was discharged to home in stable medical condition.      Graciella Belton, MD  Electronically Signed      Jamal Collin. Andria Frames, M.D.  Electronically Signed    MO/MEDQ  D:  08/01/2007  T:  08/02/2007  Job:  [id number]

## 2011-02-22 NOTE — Consult Note (Signed)
NAME:  Steven Frey, Steven Frey NO.:  [id number]   MEDICAL RECORD NO.:  [id number]          PATIENT TYPE:  INP   LOCATION:  [id number]                         FACILITY:  [facility]   PHYSICIAN:  Minus Breeding, MD, FACCDATE OF BIRTH:  May 28, 1956   DATE OF CONSULTATION:  01/03/2009  DATE OF DISCHARGE:                                 CONSULTATION   PRIMARY CARE PHYSICIAN:  Thomes Lolling, MD   REASON FOR CONSULTATION:  Evaluate the patient with chest pain.   HISTORY OF PRESENT ILLNESS:  The patient is a pleasant 55 year old  African American gentleman without prior cardiac history.  He was  admitted with chest and abdominal pain.  He was found to have  pancreatitis, it thought to be related to alcohol.  His lipase is 634.  He did have an EtOH level of 96.  Abdominal ultrasound demonstrated no  bowel obstruction.  There was a left pleural effusion reported.  Pancreatic duct was said to be prominent.  Chest x-ray did not confirm  the pleural effusion.   We are consulted because he had some lateral T-wave changes and was  complaining of chest pain.  His pain actually started this morning at  7:30 while he was watching TV.  It is described rather acute onset.  He  described a left-sided sharp discomfort.  It seemed to radiate towards  the right side.  He did not have radiation to his arm or his neck.  He  did not think that this was like previous pancreatitis.  He was short of  breath with this.  It was 9/10 in intensity.  He was somewhat  diaphoretic.  He presented to the emergency room where he had the above  evaluation.  He was treated with morphine and Zofran.  He is now  comfortable though he still nauseated.  His EKG en route demonstrated  nonspecific lateral T-wave flattening with premature ventricular  contractions, normal sinus rhythm.  He did have one EKG in May 2009.  This suggests some T-wave inversion.  It was not present on EKGs today.   The patient said that prior to this  morning, he was doing fine.  He is  active.  He actually is able to pedal a stationary bike.  He has done  that recently.  He has not had any chest pressure, neck, or arm  discomfort.  He does not usually have any shortness of breath.  He does  not describe PND or orthopnea.  He has no palpitations, presyncope, or  syncope.   PAST MEDICAL HISTORY:  1. EtOH abuse.  2. Tobacco abuse.  3. Borderline hypertension.  4. Alcoholic pancreatitis in the past.   PAST SURGICAL HISTORY:  1. Right wrist surgery.  2. Left hand surgery.  3. Tonsillectomy.   ALLERGIES:  1. ACE induced angioedema requiring hospitalization and intubation.  2. AZITHROMYCIN.   SOCIAL HISTORY:  The patient was working in [employer], but lost his job.  He has been smoking at least a half a pack of cigarettes for 35 years.  He drinks alcohol as described.  He lives with his father.  He has 2  teenage children, who live with them.   FAMILY HISTORY:  Noncontributory for early coronary artery disease.  His  mother had end-stage renal disease.   REVIEW OF SYSTEMS:  As stated in the HPI and otherwise negative for all  other systems.   PHYSICAL EXAMINATION:  GENERAL:  The patient is pleasant and in no  distress.  VITAL SIGNS:  Blood pressure 141/91, heart rate 76, temperature 97.6,  oxygen saturation 98% on room air, and respiratory rate 14.  HEENT:  Eyelids unremarkable, pupils equal and reactive to light, fundi  not visualized, oral mucosa unremarkable.  NECK:  No jugular vein distention at 45 degrees, carotid upstroke brisk  and symmetrical.  No bruits, no thyromegaly.  LYMPHATICS:  No cervical, axillary, or inguinal adenopathy.  LUNGS:  Clear to auscultation bilaterally.  BACK:  No costovertebral angle tenderness.  CHEST:  Unremarkable.  HEART:  PMI not displaced or sustained, positive S4, no S3, no clicks,  rubs, murmurs.  ABDOMEN:  Distended, decreased bowel sounds, midline tenderness to  palpation, no rebound  or guarding, no obvious thyromegaly or  splenomegaly.  SKIN:  No rashes, no lesions.  EXTREMITIES:  2+ pulses throughout, no edema, no cyanosis or clubbing.  NEUROLOGIC:  Oriented to person, place, and time, cranial nerves II  through XII grossly intact, motor grossly intact.   EKG sinus rhythm, rate 90, axis within normal limits, intervals within  normal limits, no acute ST-T wave changes.   LABORATORY DATA:  Point-of-care markers negative x2.  CK-MB and troponin  negative x1.  Magnesium 1.7, sodium 136, potassium 3.4, BUN 67,  creatinine 0.9, calcium 9.0, lactic acid 3.3.  WBC 10.2 and hemoglobin  14.2.   ASSESSMENT/PLAN:  1. Chest pain.  The patient's chest discomfort does have some      worrisome features for angina.  However, he has no objective      evidence of ischemia.  His EKG is nonspecific.  He has an      alternative diagnosis with acute pancreatitis confirmed.  At this      point, I think the possibility of an acute coronary process as      ongoing as well is unlikely.  He is to be managed for his      pancreatitis by being kept n.p.o.  I would not suggest any oral      therapies from a cardiac standpoint.  We will cycle enzymes.  I      would like to get an EKG to make sure he has no regional wall      motion abnormalities.  Also, I hear an S4, wonder if he might have      some left ventricular hypertrophy.  If his enzymes are normal and      his echo was normal, and his pain resolves with bowel rest, I would      not suggest further cardiac testing.  2. Hypertension.  His blood pressure is slightly elevated.  When he is      able to take p.o., I would suggest therapy      for this.  Perhaps amlodipine as once daily generic.  3. Tobacco.  I discussed this with him.  He will continue to be      educated.  4. Alcohol abuse.  The patient will continue to be counseled about      this.      Minus Breeding, MD, [facility]  Electronically Signed  JH/MEDQ  D:   01/03/2009  T:  01/04/2009  Job:  [id number]   cc:   Thomes Lolling, M.D.

## 2011-02-22 NOTE — Discharge Summary (Signed)
NAME:  Steven Frey, Steven Frey NO.:  [id number]   MEDICAL RECORD NO.:  [id number]          PATIENT TYPE:  INP   LOCATION:  63                         FACILITY:  [facility]   PHYSICIAN:  Burnett Harry. Joya Gaskins, MD, FCCPDATE OF BIRTH:  [birth year]   DATE OF ADMISSION:  02/28/2008  DATE OF DISCHARGE:  03/03/2008                               DISCHARGE SUMMARY   DISCHARGE DIAGNOSES:  1. Upper respiratory obstruction due to angioedema.  2. Hypertension.  3. Hyperglycemia.  4. Long term ETOH abuse.   HISTORY OF PRESENT ILLNESS:  Mr. Seiichi Debusk. Bolan is a 55 year old male  who was admitted on Feb 28, 2008 for angioedema after being started on  Lisinopril and Zithromax.  He does have a past medical history of ETOH  abuse, pancreatitis and hypertension.  He required nasotracheal  intubation from Feb 28, 2008 to Mar 01, 2008.  He was also placed on  sedation protocol from Feb 28, 2008 to Mar 01, 2008 and hyperglycemia  resistance scale from Feb 28, 2008 to Mar 02, 2008.   LABORATORY DATA:  White blood cells 20.1, hemoglobin 11.2, hematocrit  32.2, platelet count 166,000.  PT is 12.8.  INR is 0.9. PTT is 24.  Sodium 137, potassium 4.1, chloride 105, CO2 28, glucose 131, BUN 19,  creatinine 1.06.  Total protein 5.9, albumin 3.3, AST 21, ALT 15, ALP  62, total bilirubin 1.1.  Hemoglobin A1C was 5.0. CK 215, CK-MB 3.7.  Troponin-I is 0.04.  A 12-lead EKG shows sinus tachycardia with frequent  PVC's.   PROCEDURE:  Note that he was taken to the operating room for urgent  intubation per anesthesia with ENT backup but did not require  tracheostomy.  He had a nasotracheal intubation performed on Feb 28, 2008.   HOSPITAL COURSE BY DISCHARGE DIAGNOSIS:  1. Upper airway obstruction and ventilatory-dependent respiratory      failure secondary to angioedema.  This was from ACE inhibitor's      versus Zithromax.  He was admitted to the hospital.  He had a      nasotracheal intubation performed  by anesthesia with ENT backup if      he should require tracheostomy.  He was also placed on steroids      along with bronchodilators and sedation.  He was successfully      weaned from mechanical ventilatory support on Mar 01, 2008 without      any further upper airway obstruction.  He has been instructed to      list ACE INHIBITOR and ZITHROMAX on his ALLERGY list.  He was      continued on Pepcid and was placed on a rapid steroid taper as an      outpatient.   1. Hypertension.  His blood pressure remained well controlled.  No      antihypertensives were required at this time.   1. Hyperglycemia which was treated with sliding scale insulin, which      was stable.  He was transferred to the floor and subsequently      discharged home.   1. Note that  he also has a history of ETOH abuse and was placed on a      Librium taper.  He had no signs of DT's on the day of dis and was      not to imbibe in any alcohol.   DISCHARGE MEDICATIONS:  1. Librium one three times a day until gone.  2. Prednisone on a taper, two a day for two days, one a day for two      days and then stop.  3. Pepcid one two times a day until gone.  4. No blood pressure medications.  5. No alcohol.   DIET:  He is to be on a low salt diet.   FOLLOWUP:  He is to followup with Rexene Edison, NP at [phone] in one  week after discharge.   ACTIVITY:  He is to increase his activity slowly.   DISPOSITION/CONDITION ON DISCHARGE:  Improved.      Gaylyn Lambert, MSN, ACNP      Burnett Harry. Joya Gaskins, MD, [facility]  Electronically Signed    SM/MEDQ  D:  04/01/2008  T:  04/01/2008  Job:  [id number]

## 2011-02-25 NOTE — Discharge Summary (Signed)
NAME:  DASHONE, BIXLER NO.:  [id number]   MEDICAL RECORD NO.:  [id number]          PATIENT TYPE:  INP   LOCATION:  3712                         FACILITY:  [facility]   PHYSICIAN:  Thomes Lolling, M.D.    DATE OF BIRTH:  1956-04-18   DATE OF ADMISSION:  01/03/2009  DATE OF DISCHARGE:  01/07/2009                               DISCHARGE SUMMARY   ATTENDING PHYSICIAN:  Thomes Lolling, MD   DISCHARGE DIAGNOSES:  1. Acute pancreatitis.  2. Chest pain likely due to pancreatitis-associated pleuritic      effusion.  3. Alcohol abuse with withdrawal.  4. Hypertension.  5. Tobacco abuse.   DISCHARGE MEDICATIONS:  1. Aspirin 81 mg p.o. daily.  2. Hydrochlorothiazide 25 mg p.o. daily.  3. Vitamin B1 100 mg p.o. daily.   DISPOSITION AND FOLLOWUP:  Mr. Lebsack was discharged from the hospital  on January 07, 2009, in stable and improved condition.  His abdominal pain  has significantly improved and had tolerated regular food very well, his  chest pain had resolved.  He will have an appointment with Dr. Ronny Flurry at  [facility] on January 26, 2009, at 2 p.m.  At  that time, we will check his abdominal symptoms including abdominal  pain. check his blood pressure and we will adjust his medications if  necessary.  We will also check his BMET at that time.   CONSULTATIONS:  Minus Breeding, MD, [facility], Cardiology.   PROCEDURES PERFORMED:  1. Chest x-ray on January 03, 2009, shows no acute cardiopulmonary      finding.  2. Chest x-ray on January 05, 2009, shows left pleural effusion with      bibasilar atelectasis.  3. Abdominal ultrasound on January 03, 2009, shows fatty liver and left      pleural effusion.   ADMISSION HISTORY:  The patient is a 55 year old African American male  with past medical history of alcohol abuse, alcohol-associated  pancreatitis came to the [facility] Emergency Department presenting with  abdominal pain and chest pain.  This started in the  morning of admission  date when he was watching TV, and his abdominal pain located in the left  upper quadrant, sharp, constant, 4-5/10, no radiation.  At the same  time, he also had left-sided chest pain, sharp, constant, 10/10 of worst  in severity, no radiation.  He also had nausea, vomiting with subjective  fever and sweating, no blood in his vomitus.  He had bowel movement in  the morning of admission date.  He admitted alcohol drink about two  ounces a day for about 6 months, denies recent weight change, diarrhea  or dysuria.  He had no use of drugs.   ADMISSION PHYSICAL EXAMINATION:  VITAL SIGNS:  Temperature 96.7, blood  pressure 116/80, pulse 117, respiration rate 20, and oxygen saturation  100% on room air.  GENERAL:  The patient is in no acute distress, but appears uncomfortable  for pain.  HEENT:  Eyes, pupils are equal, round, and reactive to light.  Dry  mucosa.  NECK:  Supple.  No thyroid enlargement.  LUNGS:  Clear to auscultation bilaterally.  No wheezing or crackles.  HEART:  Regular rate and rhythm.  No murmur.  ABDOMEN:  Soft.  Left upper quadrant tenderness.  No rebounding.  Bowel  sounds positive, but hypoactive.  EXTREMITIES:  No edema.  NEURO:  Nonfocal.   ADMISSION LABS:  White blood cell 10.2, hemoglobin 14.2, platelets 169,  MCV 102.9.  Sodium 140, potassium 3.2, chloride 104, bicarb 20, BUN 8,  creatinine 0.9, glucose 166.  Bilirubin 0.6, alkaline phosphatase 76,  AST 92, ALT 76, protein 7.1, albumin 3.8, lipase 634.  Alcohol level 96.   HOSPITAL COURSE:  1. Acute pancreatitis.  The patient has past history of acute      pancreatitis associated with alcohol abuse and he has been drinking      alcohol over the past 6 months.  When he was admitted to the      hospital his lipase was of 674 and alcohol level of 96, and he has      left upper quadrant abdominal pain with tenderness.  So his symptom      is most likely due to acute pancreatitis, other causes  of      pancreatitis including gallstone, and high triglyceride are      unlikely because no evidence to support these.. After admission, we      have treated the patient with n.p.o., fluid repletion. Later the      patient's symptoms has significantly improved.  On discharge day,      he tolerated the regular diet very well and was discharge from the      hospital. We will follow up his abdominal symptoms after discharge.  2. Chest pain likely due to acute pancreatitis.  The patient has chest      pain with abnormal EKG which shows T-wave inversion on lead III,      aVF, and V4-V6.  We are worried about his acute coronary syndrome,      so we did the cyclic cardiac enzymes and also had requested the      cardiology consult.  Later on, we rechecked the EKG and the T-wave      inversion reversed back to normal, so Dr. Percival Spanish recommended for      the 2-D echo, which did not show any cardiac wall motion      abnormalities, no further workup at this time. So his chest pain is      likely due to the pancreatitis associated pleural inflammation as      evidenced by the chest x-ray, and ultrasound which shows the left      side mild pleural effusion.  3. Alcohol abuse and withdrawal.  On admission, the patient's alcohol      level is 96 and on the next day, his alcohol level was less than 5.      We have given the patient with thiamine and folate. The patient had      1 episode of agitation and altered mental status which is likely      due to the alcohol withdrawal, we treated the patient with Ativan      and CIWA protocol to prevent DT. The patient had been doing well      after that.  On discharge, the patient will continue to take      thiamine, and we have provided alcohol abuse counseling, the      patient would like to quit alcohol.  4. Hypertension.  The  patient has hypertension and he will take      hydrochlorothiazide after discharge.  On discharge, his blood      pressure is  130/82.  5. Tobacco abuse.  The patient was given nicotine patch and have      provided smoking cessation counseling, and he want to cut the      smoking after the discharge.   DISCHARGE VITALS:  Temperature 97.8, blood pressure 130/82, pulse 79,  respiration rate 18, oxygen saturation 94% on room air.   DISCHARGE LABORATORY:  White blood cell 10, hemoglobin 11.3, platelets  185.  Sodium 133, potassium is 4.3, chloride 102, bicarb 21, BUN 5,  creatinine 0.99, glucose 90.      Geanie Kenning, MD  Electronically Signed      Thomes Lolling, M.D.  Electronically Signed    ZY/MEDQ  D:  01/08/2009  T:  01/09/2009  Job:  [id number]   cc:   Outpatient Clinic

## 2011-03-09 ENCOUNTER — Other Ambulatory Visit: Payer: Self-pay | Admitting: *Deleted

## 2011-03-09 MED ORDER — CARVEDILOL 3.125 MG PO TABS: 3.1250 mg | ORAL_TABLET | Freq: Two times a day (BID) | ORAL | Status: DC

## 2011-03-11 ENCOUNTER — Encounter: Payer: Self-pay | Admitting: Internal Medicine

## 2011-03-30 ENCOUNTER — Ambulatory Visit: Payer: Medicaid Other | Admitting: Internal Medicine

## 2011-07-06 LAB — BLOOD GAS, ARTERIAL
Acid-base deficit: 0.6
Bicarbonate: 24.4 — ABNORMAL HIGH
Drawn by: 129801
FIO2: 0.4
MECHVT: 600
O2 Saturation: 99.5
PEEP: 5
Patient temperature: 98.6
RATE: 12
TCO2: 22
pCO2 arterial: 43.8
pH, Arterial: 7.364
pO2, Arterial: 152 — ABNORMAL HIGH

## 2011-07-06 LAB — CBC
HCT: 33.2 — ABNORMAL LOW
HCT: 38.5 — ABNORMAL LOW
HCT: 38.9 — ABNORMAL LOW
HCT: 42.3
Hemoglobin: 11 — ABNORMAL LOW
Hemoglobin: 12.9 — ABNORMAL LOW
Hemoglobin: 13
Hemoglobin: 14
MCHC: 33
MCHC: 33.1
MCHC: 33.5
MCHC: 33.5
MCV: 100.3 — ABNORMAL HIGH
MCV: 100.9 — ABNORMAL HIGH
MCV: 101.2 — ABNORMAL HIGH
MCV: 102.3 — ABNORMAL HIGH
Platelets: 166
Platelets: 184
Platelets: 184
Platelets: 187
RBC: 3.25 — ABNORMAL LOW
RBC: 3.82 — ABNORMAL LOW
RBC: 3.88 — ABNORMAL LOW
RBC: 4.18 — ABNORMAL LOW
RDW: 13.9
RDW: 14.3
RDW: 14.4
RDW: 14.4
WBC: 12.6 — ABNORMAL HIGH
WBC: 13.2 — ABNORMAL HIGH
WBC: 20.1 — ABNORMAL HIGH
WBC: 23.2 — ABNORMAL HIGH

## 2011-07-06 LAB — APTT: aPTT: 24

## 2011-07-06 LAB — BASIC METABOLIC PANEL
BUN: 18
BUN: 19
BUN: 22
CO2: 23
CO2: 25
CO2: 28
Calcium: 9
Calcium: 9
Calcium: 9.4
Chloride: 100
Chloride: 105
Chloride: 99
Creatinine, Ser: 1.06
Creatinine, Ser: 1.31
Creatinine, Ser: 1.76 — ABNORMAL HIGH
GFR calc Af Amer: 50 — ABNORMAL LOW
GFR calc Af Amer: >60
GFR calc Af Amer: >60
GFR calc non Af Amer: 41 — ABNORMAL LOW
GFR calc non Af Amer: 58 — ABNORMAL LOW
GFR calc non Af Amer: >60
Glucose, Bld: 118 — ABNORMAL HIGH
Glucose, Bld: 122 — ABNORMAL HIGH
Glucose, Bld: 131 — ABNORMAL HIGH
Potassium: 4.1
Potassium: 4.2
Potassium: 4.5
Sodium: 136
Sodium: 136
Sodium: 137

## 2011-07-06 LAB — DIFFERENTIAL
Basophils Absolute: 0
Basophils Relative: 0
Eosinophils Absolute: 0
Eosinophils Relative: 0
Lymphocytes Relative: 8 — ABNORMAL LOW
Lymphs Abs: 1
Monocytes Absolute: 0.6
Monocytes Relative: 4
Neutro Abs: 11.6 — ABNORMAL HIGH
Neutrophils Relative %: 88 — ABNORMAL HIGH

## 2011-07-06 LAB — COMPREHENSIVE METABOLIC PANEL
ALT: 16
AST: 21
Albumin: 3.3 — ABNORMAL LOW
Alkaline Phosphatase: 62
BUN: 28 — ABNORMAL HIGH
CO2: 22
Calcium: 8.5
Chloride: 98
Creatinine, Ser: 2.44 — ABNORMAL HIGH
GFR calc Af Amer: 34 — ABNORMAL LOW
GFR calc non Af Amer: 28 — ABNORMAL LOW
Glucose, Bld: 138 — ABNORMAL HIGH
Potassium: 3.7
Sodium: 132 — ABNORMAL LOW
Total Bilirubin: 1.1
Total Protein: 5.9 — ABNORMAL LOW

## 2011-07-06 LAB — CARDIAC PANEL(CRET KIN+CKTOT+MB+TROPI)
CK, MB: 3.7
Relative Index: 1.7
Total CK: 215
Troponin I: 0.04

## 2011-07-06 LAB — HEMOGLOBIN A1C
Hgb A1c MFr Bld: 5
Mean Plasma Glucose: 101

## 2011-07-06 LAB — PROTIME-INR
INR: 0.9
Prothrombin Time: 12.8

## 2011-07-20 LAB — BASIC METABOLIC PANEL
BUN: 5 — ABNORMAL LOW
CO2: 26
Calcium: 9.5
Chloride: 101
Creatinine, Ser: 1.04
GFR calc Af Amer: >60
GFR calc non Af Amer: >60
Glucose, Bld: 83
Potassium: 3.8
Sodium: 138

## 2011-07-20 LAB — FOLATE RBC: RBC Folate: 193

## 2011-07-20 LAB — CBC
HCT: 35.3 — ABNORMAL LOW
HCT: 36.3 — ABNORMAL LOW
HCT: 38.4 — ABNORMAL LOW
HCT: 39.5
Hemoglobin: 11.6 — ABNORMAL LOW
Hemoglobin: 11.9 — ABNORMAL LOW
Hemoglobin: 12.4 — ABNORMAL LOW
Hemoglobin: 12.8 — ABNORMAL LOW
MCHC: 32.4
MCHC: 32.5
MCHC: 32.8
MCHC: 32.8
MCV: 101.4 — ABNORMAL HIGH
MCV: 102 — ABNORMAL HIGH
MCV: 102.7 — ABNORMAL HIGH
MCV: 103 — ABNORMAL HIGH
Platelets: 195
Platelets: 207
Platelets: 237
Platelets: 264
RBC: 3.46 — ABNORMAL LOW
RBC: 3.58 — ABNORMAL LOW
RBC: 3.73 — ABNORMAL LOW
RBC: 3.84 — ABNORMAL LOW
RDW: 13.7
RDW: 14.1 — ABNORMAL HIGH
RDW: 14.4 — ABNORMAL HIGH
RDW: 14.6 — ABNORMAL HIGH
WBC: 10.9 — ABNORMAL HIGH
WBC: 11.6 — ABNORMAL HIGH
WBC: 11.7 — ABNORMAL HIGH
WBC: 14.3 — ABNORMAL HIGH

## 2011-07-20 LAB — LIPID PANEL
Cholesterol: 193
HDL: 101
LDL Cholesterol: 83
Total CHOL/HDL Ratio: 1.9
Triglycerides: 45
VLDL: 9

## 2011-07-20 LAB — RAPID URINE DRUG SCREEN, HOSP PERFORMED
Amphetamines: NOT DETECTED
Barbiturates: NOT DETECTED
Benzodiazepines: NOT DETECTED
Cocaine: NOT DETECTED
Opiates: POSITIVE — AB
Tetrahydrocannabinol: NOT DETECTED

## 2011-07-20 LAB — RETICULOCYTES
RBC.: 3.78 — ABNORMAL LOW
Retic Count, Absolute: 75.6
Retic Ct Pct: 2

## 2011-07-20 LAB — COMPREHENSIVE METABOLIC PANEL
ALT: 28
ALT: 30
ALT: 36
ALT: 43
AST: 28
AST: 32
AST: 44 — ABNORMAL HIGH
AST: 60 — ABNORMAL HIGH
Albumin: 3.1 — ABNORMAL LOW
Albumin: 3.2 — ABNORMAL LOW
Albumin: 3.3 — ABNORMAL LOW
Albumin: 3.3 — ABNORMAL LOW
Alkaline Phosphatase: 46
Alkaline Phosphatase: 52
Alkaline Phosphatase: 52
Alkaline Phosphatase: 56
BUN: 1 — ABNORMAL LOW
BUN: 3 — ABNORMAL LOW
BUN: 5 — ABNORMAL LOW
BUN: 5 — ABNORMAL LOW
CO2: 24
CO2: 25
CO2: 25
CO2: 27
Calcium: 8.1 — ABNORMAL LOW
Calcium: 8.7
Calcium: 8.7
Calcium: 9.5
Chloride: 109
Chloride: 97
Chloride: 99
Chloride: 99
Creatinine, Ser: 0.77
Creatinine, Ser: 0.87
Creatinine, Ser: 0.9
Creatinine, Ser: 0.94
GFR calc Af Amer: >60
GFR calc Af Amer: >60
GFR calc Af Amer: >60
GFR calc Af Amer: >60
GFR calc non Af Amer: >60
GFR calc non Af Amer: >60
GFR calc non Af Amer: >60
GFR calc non Af Amer: >60
Glucose, Bld: 105 — ABNORMAL HIGH
Glucose, Bld: 111 — ABNORMAL HIGH
Glucose, Bld: 156 — ABNORMAL HIGH
Glucose, Bld: 96
Potassium: 3.2 — ABNORMAL LOW
Potassium: 3.6
Potassium: 3.6
Potassium: 3.7
Sodium: 132 — ABNORMAL LOW
Sodium: 132 — ABNORMAL LOW
Sodium: 135
Sodium: 144
Total Bilirubin: 0.5
Total Bilirubin: 0.7
Total Bilirubin: 1.7 — ABNORMAL HIGH
Total Bilirubin: 1.9 — ABNORMAL HIGH
Total Protein: 5.8 — ABNORMAL LOW
Total Protein: 6.2
Total Protein: 6.4
Total Protein: 6.7

## 2011-07-20 LAB — URINALYSIS, ROUTINE W REFLEX MICROSCOPIC
Bilirubin Urine: NEGATIVE
Glucose, UA: NEGATIVE
Hgb urine dipstick: NEGATIVE
Ketones, ur: NEGATIVE
Leukocytes, UA: NEGATIVE
Nitrite: NEGATIVE
Protein, ur: 30 — AB
Specific Gravity, Urine: 1.018
Urobilinogen, UA: 0.2
pH: 5

## 2011-07-20 LAB — AMYLASE
Amylase: 1767 — ABNORMAL HIGH
Amylase: 269 — ABNORMAL HIGH
Amylase: 595 — ABNORMAL HIGH

## 2011-07-20 LAB — DIFFERENTIAL
Basophils Absolute: 0
Basophils Relative: 0
Eosinophils Absolute: 0
Eosinophils Relative: 0
Lymphocytes Relative: 23
Lymphs Abs: 3.3
Monocytes Absolute: 0.8 — ABNORMAL HIGH
Monocytes Relative: 6
Neutro Abs: 10.1 — ABNORMAL HIGH
Neutrophils Relative %: 71

## 2011-07-20 LAB — PROTIME-INR
INR: 0.9
Prothrombin Time: 12.6

## 2011-07-20 LAB — LACTATE DEHYDROGENASE
LDH: 173
LDH: 191

## 2011-07-20 LAB — ETHANOL: Alcohol, Ethyl (B): 280 — ABNORMAL HIGH

## 2011-07-20 LAB — LIPASE, BLOOD
Lipase: 2273 — ABNORMAL HIGH
Lipase: 71 — ABNORMAL HIGH

## 2011-07-20 LAB — VITAMIN B12: Vitamin B-12: 368 (ref 211–911)

## 2011-07-20 LAB — AMMONIA: Ammonia: 43 — ABNORMAL HIGH

## 2011-07-20 LAB — URINE MICROSCOPIC-ADD ON

## 2011-07-20 LAB — FOLATE: Folate: 8.2

## 2011-09-20 ENCOUNTER — Encounter (INDEPENDENT_AMBULATORY_CARE_PROVIDER_SITE_OTHER): Payer: Self-pay | Admitting: Surgery

## 2011-09-26 ENCOUNTER — Encounter (HOSPITAL_COMMUNITY): Payer: Self-pay | Admitting: Emergency Medicine

## 2011-09-26 ENCOUNTER — Emergency Department (HOSPITAL_COMMUNITY): Payer: Self-pay

## 2011-09-26 ENCOUNTER — Emergency Department (HOSPITAL_COMMUNITY)
Admission: EM | Admit: 2011-09-26 | Discharge: 2011-09-26 | Disposition: A | Discharge status: Home / Self Care | Payer: Self-pay | Attending: Emergency Medicine | Admitting: Emergency Medicine

## 2011-09-26 DIAGNOSIS — I10 Essential (primary) hypertension: Secondary | ICD-10-CM | POA: Insufficient documentation

## 2011-09-26 DIAGNOSIS — F17200 Nicotine dependence, unspecified, uncomplicated: Secondary | ICD-10-CM | POA: Insufficient documentation

## 2011-09-26 DIAGNOSIS — F1020 Alcohol dependence, uncomplicated: Secondary | ICD-10-CM | POA: Insufficient documentation

## 2011-09-26 DIAGNOSIS — Z8674 Personal history of sudden cardiac arrest: Secondary | ICD-10-CM | POA: Insufficient documentation

## 2011-09-26 DIAGNOSIS — R1032 Left lower quadrant pain: Secondary | ICD-10-CM | POA: Insufficient documentation

## 2011-09-26 DIAGNOSIS — K861 Other chronic pancreatitis: Secondary | ICD-10-CM | POA: Insufficient documentation

## 2011-09-26 DIAGNOSIS — K7030 Alcoholic cirrhosis of liver without ascites: Secondary | ICD-10-CM | POA: Insufficient documentation

## 2011-09-26 DIAGNOSIS — G40909 Epilepsy, unspecified, not intractable, without status epilepticus: Secondary | ICD-10-CM | POA: Insufficient documentation

## 2011-09-26 DIAGNOSIS — K4090 Unilateral inguinal hernia, without obstruction or gangrene, not specified as recurrent: Secondary | ICD-10-CM | POA: Insufficient documentation

## 2011-09-26 LAB — CBC
HCT: 47.3 % (ref 39.0–52.0)
Hemoglobin: 15.1 g/dL (ref 13.0–17.0)
MCH: 30 pg (ref 26.0–34.0)
MCHC: 31.9 g/dL (ref 30.0–36.0)
MCV: 93.8 fL (ref 78.0–100.0)
Platelets: 215 10*3/uL (ref 150–400)
RBC: 5.04 MIL/uL (ref 4.22–5.81)
RDW: 13.6 % (ref 11.5–15.5)
WBC: 11.9 10*3/uL — ABNORMAL HIGH (ref 4.0–10.5)

## 2011-09-26 LAB — PROTIME-INR
INR: 0.98 (ref 0.00–1.49)
Prothrombin Time: 13.2 seconds (ref 11.6–15.2)

## 2011-09-26 LAB — COMPREHENSIVE METABOLIC PANEL
ALT: 20 U/L (ref 0–53)
AST: 19 U/L (ref 0–37)
Albumin: 4.1 g/dL (ref 3.5–5.2)
Alkaline Phosphatase: 127 U/L — ABNORMAL HIGH (ref 39–117)
BUN: 14 mg/dL (ref 6–23)
CO2: 26 mEq/L (ref 19–32)
Calcium: 9.8 mg/dL (ref 8.4–10.5)
Chloride: 102 mEq/L (ref 96–112)
Creatinine, Ser: 1.02 mg/dL (ref 0.50–1.35)
GFR calc Af Amer: >90 mL/min (ref >90)
GFR calc non Af Amer: 81 mL/min — ABNORMAL LOW (ref >90)
Glucose, Bld: 148 mg/dL — ABNORMAL HIGH (ref 70–99)
Potassium: 3.9 mEq/L (ref 3.5–5.1)
Sodium: 138 mEq/L (ref 135–145)
Total Bilirubin: 0.8 mg/dL (ref 0.3–1.2)
Total Protein: 8 g/dL (ref 6.0–8.3)

## 2011-09-26 LAB — APTT: aPTT: 27 seconds (ref 24–37)

## 2011-09-26 LAB — LACTIC ACID, PLASMA: Lactic Acid, Venous: 1.9 mmol/L (ref 0.5–2.2)

## 2011-09-26 LAB — DIFFERENTIAL
Basophils Absolute: 0 10*3/uL (ref 0.0–0.1)
Basophils Relative: 0 % (ref 0–1)
Eosinophils Absolute: 0.2 10*3/uL (ref 0.0–0.7)
Eosinophils Relative: 2 % (ref 0–5)
Lymphocytes Relative: 25 % (ref 12–46)
Lymphs Abs: 3 10*3/uL (ref 0.7–4.0)
Monocytes Absolute: 0.9 10*3/uL (ref 0.1–1.0)
Monocytes Relative: 7 % (ref 3–12)
Neutro Abs: 7.9 10*3/uL — ABNORMAL HIGH (ref 1.7–7.7)
Neutrophils Relative %: 66 % (ref 43–77)

## 2011-09-26 MED ORDER — LORAZEPAM 1 MG PO TABS
1.0000 mg | ORAL_TABLET | Freq: Once | ORAL | Status: AC
  Administered 2011-09-26: 1 mg via ORAL
  Filled 2011-09-26: qty 1

## 2011-09-26 MED ORDER — MORPHINE SULFATE 4 MG/ML IJ SOLN
4.0000 mg | Freq: Once | INTRAMUSCULAR | Status: AC
  Administered 2011-09-26: 4 mg via INTRAVENOUS
  Filled 2011-09-26: qty 1

## 2011-09-26 MED ORDER — OXYCODONE-ACETAMINOPHEN 5-325 MG PO TABS: 2.0000 | ORAL_TABLET | ORAL | Status: AC | PRN

## 2011-09-26 NOTE — ED Notes (Signed)
Pt had hernia when a baby. Pain started about 3 weeks ago

## 2011-09-26 NOTE — ED Provider Notes (Addendum)
History     CSN: [id number] Arrival date & time: 09/26/2011  1:15 PM   First MD Initiated Contact with Patient 09/26/11 1713      Chief Complaint  Patient presents with  . Groin Pain  . Inguinal Hernia     HPI  55yo m h/o multiple medical problems including alcoholic cirrhosis chronic pancreatitis with pancreatic stent, seizure d/o, possible hepatocellular carcinoma, alcohol abuse, V. fib arrest with anoxic brain injury, CVA. Patient presents with left inguinal pain. He states that for about 18 months he has experienced a bulge in his left inguinal area and was diffusely diagnosed with a hernia. He states that he has been followed by his primary care doctor for this and that he was given laxatives several days ago to help with this intermittent pain. He states that he woke up this morning with severe 10 out of 10 pain. He states his pain is currently 4/10 without radiation. Denies hematuria/dysuria/freq/urgency. Denies nausea, vomiting. His last bowel movement was yesterday. He continues to pass gas.    Past Medical History  Diagnosis Date  . Angioedema     rx requiring intubation/vent support suspected secondary to ACE1 (but also on zithromax) 5/09 hospitalization   . Smoker   . ETOH abuse   . Chronic pancreatitis     (10/08 [facility]). admx 02/2010 pseudocyst aspirated during admxn. pancreatic dual stent placement at [facility] 11/11  . Cirrhosis     due to ETOH with recent hepatic abcess and possible HCC.   . Gastritis     alcohol induced  . Depression     no hx of meds  . Elevated BP     boarderline elevation in BP in past  . VF (ventricular fibrillation)     arrest 6/11. successfully rescucitated w/prolinged hospitalization due to respitatory failure. QT prolongation during cooling, now resolved  . Unspecified cerebral artery occlusion with cerebral infarction   . 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 Anxiety   . Seizures   . Edema   .  Abscess of liver   . Anoxic brain damage   . Anemia   . Other convulsions   . Cardiac arrest   . Hypertension     Past Surgical History  Procedure Date  . Inguinal hernia repair     right  . Right wrist orif     for fx  . Pancreatic stent placement     Family History  Problem Relation Age of Onset  . Coronary artery disease Neg Hx   . Colon cancer Neg Hx     History  Substance Use Topics  . Smoking status: Former Smoker -- 1.0 packs/day for 20 years    Quit date: 05/26/2010  . Smokeless tobacco: Former Systems developer    Quit date: 05/26/2010  . Alcohol Use: No     hx of alcohol abuse, last used prior to hospitalization for VF arrest 6/11. denies further ETOH since that time. has never been in AA     Review of Systems  All other systems reviewed and are negative.   except as noted HPI  Allergies  Azithromycin and Lisinopril  Home Medications   Current Outpatient Rx  Name Route Sig Dispense Refill  . BACLOFEN 10 MG PO TABS Oral Take 20 mg by mouth 3 (three) times daily.      Marland Kitchen CARVEDILOL 3.125 MG PO TABS Oral Take 1 tablet (3.125 mg total) by mouth 2 (two) times daily with a meal.  60 tablet 11  . ESOMEPRAZOLE MAGNESIUM 40 MG PO CPDR Oral Take 40 mg by mouth daily.      Marland Kitchen FOLIC ACID 1 MG PO TABS Oral Take 1 mg by mouth daily.      Marland Kitchen POLYSACCHARIDE IRON COMPLEX 150 MG PO CAPS Oral Take 150 mg by mouth 2 (two) times daily.      Marland Kitchen LEVETIRACETAM 1000 MG PO TABS Oral Take 1,000 mg by mouth 2 (two) times daily. Take 1/2 tablet am; 1 tablet pm      . PANCRELIPASE (LIP-PROT-AMYL) 12000 UNITS PO CPEP Oral Take 1 capsule by mouth daily.      Marland Kitchen LORAZEPAM 1 MG PO TABS Oral Take 1 mg by mouth every 8 (eight) hours.      Marland Kitchen ONE-DAILY MULTI VITAMINS PO TABS Oral Take 1 tablet by mouth daily. Get over counter     . NITROGLYCERIN 0.4 MG SL SUBL Sublingual Place 0.4 mg under the tongue every 5 (five) minutes as needed.      Marland Kitchen ONDANSETRON HCL 4 MG PO TABS Oral Take 4 mg by mouth every 8  (eight) hours as needed. For nausea    . OXYCODONE HCL ER 10 MG PO TB12 Oral Take 10 mg by mouth daily as needed. For pain    . VITAMIN B-1 100 MG PO TABS Oral Take 100 mg by mouth daily.      Marland Kitchen VITAMIN B-12 1000 MCG PO TABS Oral Take 1,000 mcg by mouth daily.      . SUCRALFATE 1 G PO TABS Oral Take 1 g by mouth 3 (three) times daily.        BP 131/72  Pulse 107  Temp(Src) 98.2 F (36.8 C) (Oral)  Resp 20  SpO2 98%  Physical Exam  Nursing note and vitals reviewed. Constitutional: He is oriented to person, place, and time. He appears well-developed and well-nourished. No distress.  HENT:  Head: Atraumatic.  Mouth/Throat: Oropharynx is clear and moist.  Eyes: Conjunctivae are normal. Pupils are equal, round, and reactive to light.  Neck: Neck supple.  Cardiovascular: Normal rate, regular rhythm, normal heart sounds and intact distal pulses.  Exam reveals no gallop and no friction rub.   No murmur heard. Pulmonary/Chest: Effort normal. No respiratory distress. He has no wheezes. He has no rales.  Abdominal: Soft. Bowel sounds are normal. He exhibits distension. There is no tenderness. There is no rebound and no guarding.       distension  Genitourinary:       Left-sided direct inguinal hernia without overlying color changes. Positive tenderness to palpation. Unable to reduce after ice, morphine, Trendelenburg  Musculoskeletal: Normal range of motion. He exhibits no edema and no tenderness.  Neurological: He is alert and oriented to person, place, and time.  Skin: Skin is warm and dry.  Psychiatric: He has a normal mood and affect.    ED Course  Procedures (including critical care time)  Labs Reviewed  CBC - Abnormal; Notable for the following:    WBC 11.9 (*)    All other components within normal limits  DIFFERENTIAL - Abnormal; Notable for the following:    Neutro Abs 7.9 (*)    All other components within normal limits  LACTIC ACID, PLASMA  COMPREHENSIVE METABOLIC PANEL    APTT  PROTIME-INR   No results found.   1. Inguinal hernia     MDM   The patient appears well today although he is in pain. He does have multiple  medical problems. He has a left direct inguinal hernia which cannot be reduced although there is some question about whether or not it was ever reducible. I discussed the case with Dr. Ninfa Linden who was seen and evaluated the patient. Recommends labs, CT abdomen pelvis, pain control. Does feel that the patient is a poor surgical candidate for repair. Would like update after CT completed.  Pt signed out, labs, CT A/P pending. Move to CDU  Pt getting ativan PO (home dose) for msk spasm RUE.         Blair Heys, MD 09/26/11 2051  Blair Heys, MD 09/26/11 2052

## 2011-09-26 NOTE — ED Provider Notes (Signed)
Radiology results reviewed by Dr. Ninfa Linden, who shared findings with patient.  Patient to follow-up with [facility] in the office.  Norman Herrlich, NP 09/26/11 [phone]

## 2011-09-26 NOTE — ED Notes (Signed)
Patient states he is here for hernia

## 2011-09-26 NOTE — ED Provider Notes (Signed)
I personally evaluated the patient during the encounter  See my full note for details  Blair Heys, MD 09/26/11 2319

## 2011-09-26 NOTE — ED Notes (Signed)
Pt. Reports of left inguinal pain at the rate of 4/10, had hernia several months ago. Pain worsen this morning.

## 2011-10-12 ENCOUNTER — Ambulatory Visit (INDEPENDENT_AMBULATORY_CARE_PROVIDER_SITE_OTHER): Payer: Self-pay | Admitting: Surgery

## 2011-10-14 ENCOUNTER — Telehealth: Payer: Self-pay | Admitting: Internal Medicine

## 2011-10-14 NOTE — Telephone Encounter (Signed)
PT'S FATHER AWARE WILL FORWARD TO DR Rayann Heman FOR  REVIEW./CY

## 2011-10-14 NOTE — Telephone Encounter (Signed)
New Problem:    Patient's sister called because her brother needs to have hernia surgery,b the surgeons will not set the date until they receive the ok from Dr. Rayann Heman.  She wanted to know if the patient needed to come in and have a surgical clearance OV or if Dr. Rayann Heman could just fax them the ok for the procedure. Please call back. If the patient's sister is not there please feel free to speak with her (their) father Edith Kelderman Sr.

## 2011-10-18 NOTE — Telephone Encounter (Signed)
He does not appear to have seen me since 12/11. I will need to see him in the office for surgical evaluation.

## 2011-10-19 NOTE — Telephone Encounter (Signed)
APPT  SCHEDULED WITH SCOTT WEAVER PAC  FOR PRE OP CLEARANCE  ON 10-26-11 @ 4:00 PM./CY

## 2011-10-19 NOTE — Telephone Encounter (Signed)
APPT WITH SCOTT WEAVER [facility] SCHEDULED  FOR 10-26-11 AT 4:00 PM

## 2011-10-26 ENCOUNTER — Encounter: Payer: Self-pay | Admitting: Physician Assistant

## 2011-10-26 ENCOUNTER — Ambulatory Visit (INDEPENDENT_AMBULATORY_CARE_PROVIDER_SITE_OTHER): Payer: Self-pay | Admitting: Physician Assistant

## 2011-10-26 ENCOUNTER — Other Ambulatory Visit: Payer: Self-pay | Admitting: Physician Assistant

## 2011-10-26 DIAGNOSIS — I498 Other specified cardiac arrhythmias: Secondary | ICD-10-CM

## 2011-10-26 DIAGNOSIS — R000 Tachycardia, unspecified: Secondary | ICD-10-CM | POA: Insufficient documentation

## 2011-10-26 DIAGNOSIS — Z01810 Encounter for preprocedural cardiovascular examination: Secondary | ICD-10-CM | POA: Insufficient documentation

## 2011-10-26 DIAGNOSIS — I469 Cardiac arrest, cause unspecified: Secondary | ICD-10-CM

## 2011-10-26 NOTE — Assessment & Plan Note (Signed)
As noted, will work up his sinus tach further.  Return in 2 weeks.

## 2011-10-26 NOTE — Patient Instructions (Signed)
Your physician recommends that you schedule a follow-up appointment in: 1-2 weeks with Dr Rayann Heman Your physician recommends that you return for lab work in: today (BMP, CBC, TSH, Liver panel, D-Dimer) Your physician has requested that you have an echocardiogram. Echocardiography is a painless test that uses sound waves to create images of your heart. It provides your doctor with information about the size and shape of your heart and how well your heart's chambers and valves are working. This procedure takes approximately one hour. There are no restrictions for this procedure. (10/31/10)

## 2011-10-26 NOTE — Assessment & Plan Note (Signed)
He has deferred ICD and Dr. Thompson Grayer has agreed with this.  Check echo and labs as noted and follow up in 1-2 weeks.

## 2011-10-26 NOTE — Assessment & Plan Note (Addendum)
Etiology for this is not entirely clear.  I discussed his case with Dr. Caryl Comes.  His ECG is different from last one available in 3/12.  This does not appear to be an atrial tach.  It appears to be sinus tach.  The question is why.  He does not have any symptoms that suggest anemia or acute illness.  He is not in significant pain.  We will have him undergo an echocardiogram.  Also check bmet, cbc, tsh, ddimer, LFTs.  We did walk him in the office and his O2 stayed at 95-98%.  His HR went up to 123.  Follow up in 1-2 weeks with Dr. Thompson Grayer or me on a day he is here.

## 2011-10-26 NOTE — Progress Notes (Signed)
[address] [place], [place]  [address] Phone: [phone] Fax:  [phone]  Date:  10/26/2011   Name:  Steven Frey       DOB:  [birth year] MRN:  [id number]  PCP:  Karoline Caldwell Primary Cardiologist:  Dr. Thompson Grayer  Primary Electrophysiologist:  Dr. Thompson Grayer    History of Present Illness: Steven Frey is a 56 y.o. male who presents for surgical clearance.  He is a prior patient of mine at [facility].  He has complex past medical history with chronic pancreatitis in the setting of alcohol abuse, cirrhosis, possible hepatocellular carcinoma, seizure disorder, prior stroke, depression.  He has undergone pancreatic ductal stenting at [facility].  He had an out of hospital VF arrest in 6/11 in the setting of heavy ETOH use and electrolyte depletion.  He was resuscitated and placed on the [facility] Protocol.  He had QT prolongation during cooling that eventually resolved.  He had a prolonged hospitalization with an anoxic brain injury.  Cardiac catheterization 6/11: Normal coronary arteries, minimal inferobasal wall hypokinesis, EF 55%.  Echocardiogram 7/11: EF 99991111, grade 2 diastolic dysfunction, PASP 26.  He had previously worn a LifeVest, but refused to keep wearing it.  He was previously felt to be a poor ICD candidate, and the patient did not wish to pursue this.  He was last seen by Dr. Rayann Heman 3/12.  He has an inguinal hernia that needs repair.  Surgeon is Dr. Ninfa Linden.  He was in the ED 12/17 and hernia is not reducible.  He denies chest pain.  He has right sided hemiplegia.  Notes mild DOE with activity.  No orthopnea, PND or edema.  No syncope. No palpitations.    Past Medical History  Diagnosis Date  . Angioedema     rx requiring intubation/vent support suspected secondary to ACE1 (but also on zithromax) 5/09 hospitalization   . Smoker   . ETOH abuse   . Chronic pancreatitis     (10/08 [facility]). admx 02/2010 pseudocyst aspirated during admxn. pancreatic  dual stent placement at [facility] 11/11  . Cirrhosis     due to ETOH with recent hepatic abcess and possible HCC.   . Gastritis     alcohol induced  . Depression     no hx of meds  . Elevated BP     boarderline elevation in BP in past  . VF (ventricular fibrillation)     arrest 6/11. successfully rescucitated w/prolinged hospitalization due to respitatory failure. QT prolongation during cooling, now resolved  . History of stroke   . 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 Anxiety   . Seizures   . Edema   . Abscess of liver   . Anoxic brain damage   . Anemia   . Other convulsions   . Hypertension     Current Outpatient Prescriptions  Medication Sig Dispense Refill  . carvedilol (COREG) 3.125 MG tablet Take 1 tablet (3.125 mg total) by mouth 2 (two) times daily with a meal.  60 tablet  11  . cyclobenzaprine (FLEXERIL) 10 MG tablet Take 10 mg by mouth 3 (three) times daily as needed.      Marland Kitchen esomeprazole (NEXIUM) 40 MG capsule Take 40 mg by mouth daily.        . folic acid (FOLVITE) 1 MG tablet Take 1 mg by mouth daily.        . iron polysaccharides (NIFEREX) 150 MG capsule Take 150 mg by mouth 2 (  two) times daily.        Marland Kitchen levETIRAcetam (KEPPRA) 1000 MG tablet Take 1,000 mg by mouth 2 (two) times daily. Take 1/2 tablet am; 1 tablet pm        . lipase/protease/amylase (CREON) 12000 UNITS CPEP Take 1 capsule by mouth daily.        Marland Kitchen LORazepam (ATIVAN) 1 MG tablet Take 1 mg by mouth every 8 (eight) hours.        . Multiple Vitamin (MULTIVITAMIN) tablet Take 1 tablet by mouth daily. Get over counter       . nitroGLYCERIN (NITROSTAT) 0.4 MG SL tablet Place 0.4 mg under the tongue every 5 (five) minutes as needed.        Marland Kitchen oxyCODONE (OXYCONTIN) 10 MG 12 hr tablet Take 10 mg by mouth daily as needed. For pain      . sucralfate (CARAFATE) 1 G tablet Take 1 g by mouth 3 (three) times daily.        . Thiamine HCl (VITAMIN B-1) 100 MG tablet Take 100 mg by  mouth daily.        . vitamin B-12 (CYANOCOBALAMIN) 1000 MCG tablet Take 1,000 mcg by mouth daily.        . ondansetron (ZOFRAN) 4 MG tablet Take 4 mg by mouth every 8 (eight) hours as needed. For nausea        Allergies: Allergies  Allergen Reactions  . Azithromycin     REACTION: Body swells up and throat  . Lisinopril     REACTION: facial/neck edema    History  Substance Use Topics  . Smoking status: Former Smoker -- 1.0 packs/day for 20 years    Quit date: 05/26/2010  . Smokeless tobacco: Former Systems developer    Quit date: 05/26/2010  . Alcohol Use: No     hx of alcohol abuse, last used prior to hospitalization for VF arrest 6/11. denies further ETOH since that time. has never been in AA     ROS:  Please see the history of present illness.   Denies fevers, chills, significant cough, vomiting, diarrhea, abdominal pain, melena, hematochezia.  All other systems reviewed and negative.   PHYSICAL EXAM: VS:  BP 128/82  Pulse 88  Resp 18  Ht 5\' 8"  (1.727 m)  Wt 181 lb 12.8 oz (82.464 kg)  BMI 27.64 kg/m2 Chronically ill appearing male in no acute distress HEENT: normal Neck: no JVD Endo: no thyromegaly Cardiac:  normal S1, S2; RRR; no murmur Lungs:  clear to auscultation bilaterally, no wheezing, rhonchi or rales Abd: soft, nontender, no hepatomegaly Ext: no edema Skin: warm and dry Neuro:  CNs 2-12 intact; RUE hand contracture noted  EKG:   Sinus tachycardia, heart rate 123, normal axis, low voltage, inf Q waves, nonspecific ST-T wave changes  ASSESSMENT AND PLAN:

## 2011-10-27 ENCOUNTER — Ambulatory Visit (INDEPENDENT_AMBULATORY_CARE_PROVIDER_SITE_OTHER)
Admission: RE | Admit: 2011-10-27 | Discharge: 2011-10-27 | Disposition: A | Discharge status: Home / Self Care | Payer: Self-pay | Source: Ambulatory Visit | Attending: Physician Assistant | Admitting: Physician Assistant

## 2011-10-27 ENCOUNTER — Other Ambulatory Visit: Payer: Self-pay | Admitting: Physician Assistant

## 2011-10-27 DIAGNOSIS — I2699 Other pulmonary embolism without acute cor pulmonale: Secondary | ICD-10-CM

## 2011-10-27 LAB — CBC
HCT: 50 % (ref 39.0–52.0)
Hemoglobin: 15.5 g/dL (ref 13.0–17.0)
MCH: 29.1 pg (ref 26.0–34.0)
MCHC: 31 g/dL (ref 30.0–36.0)
MCV: 93.8 fL (ref 78.0–100.0)
Platelets: 219 10*3/uL (ref 150–400)
RBC: 5.33 MIL/uL (ref 4.22–5.81)
RDW: 13.8 % (ref 11.5–15.5)
WBC: 8 10*3/uL (ref 4.0–10.5)

## 2011-10-27 LAB — BASIC METABOLIC PANEL
BUN: 13 mg/dL (ref 6–23)
CO2: 19 mEq/L (ref 19–32)
Calcium: 9.4 mg/dL (ref 8.4–10.5)
Chloride: 100 mEq/L (ref 96–112)
Creat: 1.27 mg/dL (ref 0.50–1.35)
Glucose, Bld: 204 mg/dL — ABNORMAL HIGH (ref 70–99)
Potassium: 3.7 mEq/L (ref 3.5–5.3)
Sodium: 136 mEq/L (ref 135–145)

## 2011-10-27 LAB — BRAIN NATRIURETIC PEPTIDE: Brain Natriuretic Peptide: <2 pg/mL (ref 0.0–100.0)

## 2011-10-27 LAB — HEPATIC FUNCTION PANEL
ALT: 29 U/L (ref 0–53)
AST: 25 U/L (ref 0–37)
Albumin: 4.7 g/dL (ref 3.5–5.2)
Alkaline Phosphatase: 126 U/L — ABNORMAL HIGH (ref 39–117)
Bilirubin, Direct: 0.2 mg/dL (ref 0.0–0.3)
Indirect Bilirubin: 0.5 mg/dL (ref 0.0–0.9)
Total Bilirubin: 0.7 mg/dL (ref 0.3–1.2)
Total Protein: 8 g/dL (ref 6.0–8.3)

## 2011-10-27 LAB — TSH: TSH: 4.254 u[IU]/mL (ref 0.350–4.500)

## 2011-10-27 LAB — D-DIMER, QUANTITATIVE: D-Dimer, Quant: 0.56 ug/mL-FEU — ABNORMAL HIGH (ref 0.00–0.48)

## 2011-10-27 MED ORDER — IOHEXOL 300 MG/ML  SOLN
80.0000 mL | Freq: Once | INTRAMUSCULAR | Status: AC | PRN
  Administered 2011-10-27: 80 mL via INTRAVENOUS

## 2011-11-02 ENCOUNTER — Ambulatory Visit (HOSPITAL_COMMUNITY): Payer: Self-pay | Attending: Cardiology

## 2011-11-02 DIAGNOSIS — R000 Tachycardia, unspecified: Secondary | ICD-10-CM

## 2011-11-02 DIAGNOSIS — I498 Other specified cardiac arrhythmias: Secondary | ICD-10-CM | POA: Insufficient documentation

## 2011-11-02 DIAGNOSIS — F1021 Alcohol dependence, in remission: Secondary | ICD-10-CM | POA: Insufficient documentation

## 2011-11-02 DIAGNOSIS — K7030 Alcoholic cirrhosis of liver without ascites: Secondary | ICD-10-CM | POA: Insufficient documentation

## 2011-11-02 DIAGNOSIS — E785 Hyperlipidemia, unspecified: Secondary | ICD-10-CM | POA: Insufficient documentation

## 2011-11-02 DIAGNOSIS — I469 Cardiac arrest, cause unspecified: Secondary | ICD-10-CM

## 2011-11-02 DIAGNOSIS — Z87891 Personal history of nicotine dependence: Secondary | ICD-10-CM | POA: Insufficient documentation

## 2011-11-02 DIAGNOSIS — R079 Chest pain, unspecified: Secondary | ICD-10-CM | POA: Insufficient documentation

## 2011-11-02 DIAGNOSIS — I10 Essential (primary) hypertension: Secondary | ICD-10-CM | POA: Insufficient documentation

## 2011-11-02 DIAGNOSIS — Z8674 Personal history of sudden cardiac arrest: Secondary | ICD-10-CM | POA: Insufficient documentation

## 2011-11-10 ENCOUNTER — Ambulatory Visit: Payer: Self-pay | Admitting: Physician Assistant

## 2011-11-20 ENCOUNTER — Encounter (HOSPITAL_COMMUNITY): Payer: Self-pay | Admitting: Emergency Medicine

## 2011-11-20 ENCOUNTER — Emergency Department (HOSPITAL_COMMUNITY)
Admission: EM | Admit: 2011-11-20 | Discharge: 2011-11-20 | Disposition: A | Discharge status: Home / Self Care | Payer: Self-pay | Attending: Emergency Medicine | Admitting: Emergency Medicine

## 2011-11-20 DIAGNOSIS — Z8673 Personal history of transient ischemic attack (TIA), and cerebral infarction without residual deficits: Secondary | ICD-10-CM | POA: Insufficient documentation

## 2011-11-20 DIAGNOSIS — Z79899 Other long term (current) drug therapy: Secondary | ICD-10-CM | POA: Insufficient documentation

## 2011-11-20 DIAGNOSIS — R109 Unspecified abdominal pain: Secondary | ICD-10-CM | POA: Insufficient documentation

## 2011-11-20 DIAGNOSIS — I10 Essential (primary) hypertension: Secondary | ICD-10-CM | POA: Insufficient documentation

## 2011-11-20 DIAGNOSIS — R1030 Lower abdominal pain, unspecified: Secondary | ICD-10-CM

## 2011-11-20 DIAGNOSIS — G40909 Epilepsy, unspecified, not intractable, without status epilepticus: Secondary | ICD-10-CM | POA: Insufficient documentation

## 2011-11-20 DIAGNOSIS — K7460 Unspecified cirrhosis of liver: Secondary | ICD-10-CM | POA: Insufficient documentation

## 2011-11-20 DIAGNOSIS — F341 Dysthymic disorder: Secondary | ICD-10-CM | POA: Insufficient documentation

## 2011-11-20 MED ORDER — TRAMADOL HCL 50 MG PO TABS: 50.0000 mg | ORAL_TABLET | Freq: Four times a day (QID) | ORAL | Status: AC | PRN

## 2011-11-20 MED ORDER — TRAMADOL HCL 50 MG PO TABS
50.0000 mg | ORAL_TABLET | Freq: Once | ORAL | Status: AC
  Administered 2011-11-20: 50 mg via ORAL
  Filled 2011-11-20: qty 1

## 2011-11-20 NOTE — ED Notes (Addendum)
Pt in for hernia pain. Hernia located L groin area. Pt had CVA June 2012. Pt noticed hernia a couple months after regaining normal cognitive functioning. Pt states that trigger for pain was him lifting the hood of his father's car yesterday. Says he stretched too far and foot slipped. Father caught him before he fell. Pt to have hernia removed by MD once cardiology clears him. Pt has mild expressive aphasia. R arm is has no fine motor strength.

## 2011-11-20 NOTE — ED Notes (Signed)
C/o hernia x 1 year.  States he hurt it yesterday while holding hood of car.

## 2011-11-21 NOTE — ED Provider Notes (Addendum)
History     CSN: [id number]  Arrival date & time 11/20/11  64   First MD Initiated Contact with Patient 11/20/11 1728      Chief Complaint  Patient presents with  . Hernia    (Consider location/radiation/quality/duration/timing/severity/associated sxs/prior treatment) HPI Patient is a 56 yo M with history of a hernia for the past year that hurts worse since he was stretching a day ago.  He continues to have bowel movements and has not vomited or had any signs of obstruction.  He complains of left groin pain.  He says this is a 5/10.  There are no other associated or modifying factors.  Past Medical History  Diagnosis Date  . Angioedema     rx requiring intubation/vent support suspected secondary to ACE1 (but also on zithromax) 5/09 hospitalization   . Smoker   . ETOH abuse   . Chronic pancreatitis     (10/08 [facility]). admx 02/2010 pseudocyst aspirated during admxn. pancreatic dual stent placement at [facility] 11/11  . Cirrhosis     due to ETOH with recent hepatic abcess and possible HCC.   . Gastritis     alcohol induced  . Depression     no hx of meds  . Elevated BP     boarderline elevation in BP in past  . VF (ventricular fibrillation)     arrest 6/11. successfully rescucitated w/prolinged hospitalization due to respitatory failure. QT prolongation during cooling, now resolved  . History of stroke   . 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 Anxiety   . Seizures   . Edema   . Abscess of liver   . Anoxic brain damage   . Anemia   . Other convulsions   . Hypertension     Past Surgical History  Procedure Date  . Inguinal hernia repair     right  . Right wrist orif     for fx  . Pancreatic stent placement     Family History  Problem Relation Age of Onset  . Coronary artery disease Neg Hx   . Colon cancer Neg Hx     History  Substance Use Topics  . Smoking status: Former Smoker -- 1.0 packs/day for 20 years    Quit date:  05/26/2010  . Smokeless tobacco: Former Systems developer    Quit date: 05/26/2010  . Alcohol Use: No     hx of alcohol abuse, last used prior to hospitalization for VF arrest 6/11. denies further ETOH since that time. has never been in AA      Review of Systems  Constitutional: Negative.   HENT: Negative.   Eyes: Negative.   Respiratory: Negative.   Cardiovascular: Negative.   Gastrointestinal: Negative.   Genitourinary: Negative.   Musculoskeletal:       Left groin pain  Skin: Negative.   Neurological: Negative.   Hematological: Negative.   Psychiatric/Behavioral: Negative.   All other systems reviewed and are negative.    Allergies  Azithromycin and Lisinopril  Home Medications   Current Outpatient Rx  Name Route Sig Dispense Refill  . CARVEDILOL 3.125 MG PO TABS Oral Take 1 tablet (3.125 mg total) by mouth 2 (two) times daily with a meal. 60 tablet 11  . CYCLOBENZAPRINE HCL 10 MG PO TABS Oral Take 10 mg by mouth 3 (three) times daily as needed. For spasms    . ESOMEPRAZOLE MAGNESIUM 40 MG PO CPDR Oral Take 40 mg by mouth daily.      Marland Kitchen  FOLIC ACID 1 MG PO TABS Oral Take 1 mg by mouth daily.      Marland Kitchen POLYSACCHARIDE IRON COMPLEX 150 MG PO CAPS Oral Take 150 mg by mouth 2 (two) times daily.      Marland Kitchen LEVETIRACETAM 1000 MG PO TABS Oral Take 500-1,000 mg by mouth See admin instructions. Take 1/2 tablet am; 1 tablet pm     . PANCRELIPASE (LIP-PROT-AMYL) 12000 UNITS PO CPEP Oral Take 1 capsule by mouth daily.      Marland Kitchen LORAZEPAM 1 MG PO TABS Oral Take 1 mg by mouth every 8 (eight) hours.      . ADULT MULTIVITAMIN W/MINERALS CH Oral Take 1 tablet by mouth daily.    Marland Kitchen NITROGLYCERIN 0.4 MG SL SUBL Sublingual Place 0.4 mg under the tongue every 5 (five) minutes as needed. For chest pain    . ONDANSETRON HCL 4 MG PO TABS Oral Take 4 mg by mouth every 8 (eight) hours as needed. For nausea    . OXYCODONE HCL ER 10 MG PO TB12 Oral Take 10 mg by mouth daily as needed. For pain    . SUCRALFATE 1 G PO TABS  Oral Take 1 g by mouth 3 (three) times daily.      Marland Kitchen VITAMIN B-1 100 MG PO TABS Oral Take 100 mg by mouth daily.      Marland Kitchen VITAMIN B-12 1000 MCG PO TABS Oral Take 1,000 mcg by mouth daily.      . TRAMADOL HCL 50 MG PO TABS Oral Take 1 tablet (50 mg total) by mouth every 6 (six) hours as needed for pain. 30 tablet 0    BP 125/76  Pulse 110  Temp(Src) 97.8 F (36.6 C) (Oral)  Resp 18  SpO2 97%  Physical Exam  Nursing note and vitals reviewed. GEN: Well-developed, well-nourished male in no distress HEENT: Atraumatic, normocephalic. Oropharynx clear without erythema EYES: PERRLA BL, no scleral icterus. NECK: Trachea midline, no meningismus CV: regular rate and rhythm. No murmurs, rubs, or gallops PULM: No respiratory distress.  No crackles, wheezes, or rales. GI: soft, non-tender. No guarding, rebound, or tenderness. + bowel sounds  GU: left inguinal hernia with mild TTP, this is reducible Psych: no abnormality of mood   ED Course  Procedures (including critical care time)  Labs Reviewed - No data to display No results found.   1. Groin pain       MDM  Patient complained of worsening of his chronic hernia pain since stretching.  He was hemodynamically stable.  He was given tramadol and discharged in good condition with a prescription for this.  Patient was tachy but this is his baseline per his report.        Chauncy Passy, MD 11/21/11 1443  Chauncy Passy, MD 11/21/11 [phone]

## 2012-01-30 ENCOUNTER — Inpatient Hospital Stay (HOSPITAL_COMMUNITY)
Admission: EM | Admit: 2012-01-30 | Discharge: 2012-02-02 | DRG: 101 | Disposition: A | Discharge status: Home / Self Care | Payer: Medicaid Other | Attending: Internal Medicine | Admitting: Internal Medicine

## 2012-01-30 ENCOUNTER — Encounter (HOSPITAL_COMMUNITY): Payer: Self-pay | Admitting: *Deleted

## 2012-01-30 ENCOUNTER — Emergency Department (HOSPITAL_COMMUNITY): Payer: Medicaid Other

## 2012-01-30 DIAGNOSIS — I10 Essential (primary) hypertension: Secondary | ICD-10-CM | POA: Diagnosis present

## 2012-01-30 DIAGNOSIS — Z8673 Personal history of transient ischemic attack (TIA), and cerebral infarction without residual deficits: Secondary | ICD-10-CM

## 2012-01-30 DIAGNOSIS — G40802 Other epilepsy, not intractable, without status epilepticus: Principal | ICD-10-CM | POA: Diagnosis present

## 2012-01-30 DIAGNOSIS — F341 Dysthymic disorder: Secondary | ICD-10-CM | POA: Diagnosis present

## 2012-01-30 DIAGNOSIS — K861 Other chronic pancreatitis: Secondary | ICD-10-CM | POA: Diagnosis present

## 2012-01-30 DIAGNOSIS — F1020 Alcohol dependence, uncomplicated: Secondary | ICD-10-CM | POA: Diagnosis present

## 2012-01-30 DIAGNOSIS — I639 Cerebral infarction, unspecified: Secondary | ICD-10-CM

## 2012-01-30 DIAGNOSIS — R569 Unspecified convulsions: Secondary | ICD-10-CM | POA: Diagnosis present

## 2012-01-30 DIAGNOSIS — F079 Unspecified personality and behavioral disorder due to known physiological condition: Secondary | ICD-10-CM | POA: Diagnosis present

## 2012-01-30 DIAGNOSIS — K7030 Alcoholic cirrhosis of liver without ascites: Secondary | ICD-10-CM | POA: Diagnosis present

## 2012-01-30 DIAGNOSIS — E785 Hyperlipidemia, unspecified: Secondary | ICD-10-CM | POA: Diagnosis present

## 2012-01-30 DIAGNOSIS — E119 Type 2 diabetes mellitus without complications: Secondary | ICD-10-CM | POA: Diagnosis present

## 2012-01-30 DIAGNOSIS — I441 Atrioventricular block, second degree: Secondary | ICD-10-CM

## 2012-01-30 DIAGNOSIS — Z91199 Patient's noncompliance with other medical treatment and regimen due to unspecified reason: Secondary | ICD-10-CM

## 2012-01-30 DIAGNOSIS — F17200 Nicotine dependence, unspecified, uncomplicated: Secondary | ICD-10-CM | POA: Diagnosis present

## 2012-01-30 DIAGNOSIS — Z9119 Patient's noncompliance with other medical treatment and regimen: Secondary | ICD-10-CM

## 2012-01-30 DIAGNOSIS — Z79899 Other long term (current) drug therapy: Secondary | ICD-10-CM

## 2012-01-30 LAB — COMPREHENSIVE METABOLIC PANEL
ALT: 24 U/L (ref 0–53)
AST: 22 U/L (ref 0–37)
Albumin: 3.6 g/dL (ref 3.5–5.2)
Alkaline Phosphatase: 151 U/L — ABNORMAL HIGH (ref 39–117)
BUN: 8 mg/dL (ref 6–23)
CO2: 18 mEq/L — ABNORMAL LOW (ref 19–32)
Calcium: 9.4 mg/dL (ref 8.4–10.5)
Chloride: 101 mEq/L (ref 96–112)
Creatinine, Ser: 0.96 mg/dL (ref 0.50–1.35)
GFR calc Af Amer: >90 mL/min (ref >90)
GFR calc non Af Amer: >90 mL/min (ref >90)
Glucose, Bld: 272 mg/dL — ABNORMAL HIGH (ref 70–99)
Potassium: 4 mEq/L (ref 3.5–5.1)
Sodium: 135 mEq/L (ref 135–145)
Total Bilirubin: 0.5 mg/dL (ref 0.3–1.2)
Total Protein: 7.3 g/dL (ref 6.0–8.3)

## 2012-01-30 LAB — DIFFERENTIAL
Basophils Absolute: 0 10*3/uL (ref 0.0–0.1)
Basophils Relative: 0 % (ref 0–1)
Eosinophils Absolute: 0.1 10*3/uL (ref 0.0–0.7)
Eosinophils Relative: 1 % (ref 0–5)
Lymphocytes Relative: 20 % (ref 12–46)
Lymphs Abs: 2.5 10*3/uL (ref 0.7–4.0)
Monocytes Absolute: 0.8 10*3/uL (ref 0.1–1.0)
Monocytes Relative: 7 % (ref 3–12)
Neutro Abs: 8.8 10*3/uL — ABNORMAL HIGH (ref 1.7–7.7)
Neutrophils Relative %: 72 % (ref 43–77)

## 2012-01-30 LAB — CBC
HCT: 41.3 % (ref 39.0–52.0)
Hemoglobin: 13.5 g/dL (ref 13.0–17.0)
MCH: 30.3 pg (ref 26.0–34.0)
MCHC: 32.7 g/dL (ref 30.0–36.0)
MCV: 92.6 fL (ref 78.0–100.0)
Platelets: 249 10*3/uL (ref 150–400)
RBC: 4.46 MIL/uL (ref 4.22–5.81)
RDW: 13.7 % (ref 11.5–15.5)
WBC: 12.3 10*3/uL — ABNORMAL HIGH (ref 4.0–10.5)

## 2012-01-30 LAB — TROPONIN I: Troponin I: <0.3 ng/mL (ref <0.30)

## 2012-01-30 LAB — POCT I-STAT, CHEM 8
BUN: 7 mg/dL (ref 6–23)
Calcium, Ion: 1.19 mmol/L (ref 1.12–1.32)
Chloride: 106 mEq/L (ref 96–112)
Creatinine, Ser: 0.9 mg/dL (ref 0.50–1.35)
Glucose, Bld: 291 mg/dL — ABNORMAL HIGH (ref 70–99)
HCT: 45 % (ref 39.0–52.0)
Hemoglobin: 15.3 g/dL (ref 13.0–17.0)
Potassium: 4.2 mEq/L (ref 3.5–5.1)
Sodium: 138 mEq/L (ref 135–145)
TCO2: 20 mmol/L (ref 0–100)

## 2012-01-30 LAB — CK TOTAL AND CKMB (NOT AT ARMC)
CK, MB: 2.3 ng/mL (ref 0.3–4.0)
Relative Index: 1.4 (ref 0.0–2.5)
Total CK: 162 U/L (ref 7–232)

## 2012-01-30 LAB — PROTIME-INR
INR: 0.98 (ref 0.00–1.49)
Prothrombin Time: 13.2 seconds (ref 11.6–15.2)

## 2012-01-30 LAB — APTT: aPTT: 26 seconds (ref 24–37)

## 2012-01-30 LAB — GLUCOSE, CAPILLARY: Glucose-Capillary: 263 mg/dL — ABNORMAL HIGH (ref 70–99)

## 2012-01-30 MED ORDER — ASPIRIN 325 MG PO TABS
325.0000 mg | ORAL_TABLET | Freq: Every day | ORAL | Status: DC
  Administered 2012-01-31 – 2012-02-02 (×3): 325 mg via ORAL
  Filled 2012-01-30 (×3): qty 1

## 2012-01-30 MED ORDER — HYDROMORPHONE HCL PF 1 MG/ML IJ SOLN: 1.0000 mg | INTRAMUSCULAR | Status: DC | PRN

## 2012-01-30 MED ORDER — SODIUM CHLORIDE 0.9 % IV SOLN
500.0000 mg | Freq: Once | INTRAVENOUS | Status: AC
  Administered 2012-01-30: 500 mg via INTRAVENOUS
  Filled 2012-01-30: qty 5

## 2012-01-30 MED ORDER — LORAZEPAM 2 MG/ML IJ SOLN
2.0000 mg | Freq: Once | INTRAMUSCULAR | Status: AC
  Administered 2012-01-30: 2 mg via INTRAVENOUS

## 2012-01-30 MED ORDER — SENNOSIDES-DOCUSATE SODIUM 8.6-50 MG PO TABS: 1.0000 | ORAL_TABLET | Freq: Every evening | ORAL | Status: DC | PRN

## 2012-01-30 MED ORDER — SODIUM CHLORIDE 0.9 % IV SOLN
INTRAVENOUS | Status: DC
  Administered 2012-01-31 – 2012-02-01 (×4): via INTRAVENOUS

## 2012-01-30 MED ORDER — ONDANSETRON HCL 4 MG/2ML IJ SOLN: 4.0000 mg | Freq: Four times a day (QID) | INTRAMUSCULAR | Status: DC | PRN

## 2012-01-30 MED ORDER — OXYCODONE-ACETAMINOPHEN 5-325 MG PO TABS: 2.0000 | ORAL_TABLET | Freq: Once | ORAL | Status: DC

## 2012-01-30 MED ORDER — OXYCODONE-ACETAMINOPHEN 5-325 MG PO TABS: 1.0000 | ORAL_TABLET | ORAL | Status: AC | PRN

## 2012-01-30 MED ORDER — ENOXAPARIN SODIUM 30 MG/0.3ML ~~LOC~~ SOLN: 30.0000 mg | SUBCUTANEOUS | Status: DC

## 2012-01-30 MED ORDER — ASPIRIN 300 MG RE SUPP
300.0000 mg | Freq: Every day | RECTAL | Status: DC
  Filled 2012-01-30 (×3): qty 1

## 2012-01-30 MED ORDER — LORAZEPAM 2 MG/ML IJ SOLN
INTRAMUSCULAR | Status: AC
  Administered 2012-01-30: 2 mg via INTRAVENOUS
  Filled 2012-01-30: qty 1

## 2012-01-30 NOTE — ED Notes (Signed)
EDP at bedside  

## 2012-01-30 NOTE — ED Notes (Signed)
Followed with 3700 regarding pt. Charge RN stated that he has not assessed the pt and room availability and will call back. Informed 3700 that I will call back in 10 minutes to follow-up on RN receiving report.

## 2012-01-30 NOTE — ED Notes (Addendum)
Per EMS, family called out because of sudden onset of non-communicative at 75. Pt is normally able to carry on coversation, but unable to talk. Pt is able to make purposeful movement. Pt failed the drop test 3 times. Previous hx of CVA with rt sided deficits. Pupils deficit. CBG 235. Vitals: 128/100, HR 116,NSR, Resp 30. Breath sounds clear bilateral. 22g L wrist.

## 2012-01-30 NOTE — Code Documentation (Signed)
56yo male brought in by EMS after becoming less responsive and nonverbal per patient's family around 46. Code stroke called at 2019, patient arrived at 2028, EDP exam at 2029, stroke team arrived at 2025. Patient arrived to CT at 2031 and phlebotomy arrived at 2031, CT read by Dr. Nicole Kindred at 2035, NIH 0, patient responding verbally, residual weakness on Right arm from previous CVA, tremors noted (hx of sz) 2 mg IV ativan given, tremors stopped, code stroke cancelled at 2054.

## 2012-01-30 NOTE — ED Notes (Signed)
RN not available for report. Will follow up.

## 2012-01-30 NOTE — ED Notes (Signed)
Admitting at bedside 

## 2012-01-30 NOTE — ED Notes (Signed)
Patient was at home with family  Family reports that patient became less responsive and non verbal. Patient denies ETOH or Cocaine today.

## 2012-01-30 NOTE — H&P (Signed)
Steven Frey is an 56 y.o. male.   Chief Complaint: Stroke HPI: Patient is a 56 year old gentleman man with history of stroke in 2011 who was brought in today after being found at home unable to use his right side as well as having what appeared to be seizures. He has history of seizure disorder and is on Keppra for that. Patient was not able to speak coherently on arrival in the emergency room. He is also not able to use his right side. He has long-standing history of tobacco and alcohol abuse and has had medication noncompliance. His  sister is here with him and she's responsible for his care. Patient apparently has not been on any aspirin even though he had a stroke 2 years ago. He was taken off aspirin about a year ago. He is also not on any statin even though he had a prior stroke and has history of hyperlipidemia. 2 years ago patient was found to have transient atrial fibrillation the rate was controlled with Coreg which he continues to take now. He converted into sinus rhythm at the time before discharge. He is able to understand and follow commands at the moment. Denied any specific complaints. The family noted that he had some seizure-like activity during the onset of his weakness. He has been evaluated by neurology in the emergency room and found to be not a candidate for TPA.  Past Medical History  Diagnosis Date  . Angioedema     rx requiring intubation/vent support suspected secondary to ACE1 (but also on zithromax) 5/09 hospitalization   . Smoker   . ETOH abuse   . Chronic pancreatitis     (10/08 [facility]). admx 02/2010 pseudocyst aspirated during admxn. pancreatic dual stent placement at [facility] 11/11  . Cirrhosis     due to ETOH with recent hepatic abcess and possible HCC.   . Gastritis     alcohol induced  . Depression     no hx of meds  . Elevated BP     boarderline elevation in BP in past  . VF (ventricular fibrillation)     arrest 6/11. successfully rescucitated w/prolinged  hospitalization due to respitatory failure. QT prolongation during cooling, now resolved  . History of stroke   . 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 Anxiety   . Seizures   . Edema   . Abscess of liver   . Anoxic brain damage   . Anemia   . Other convulsions   . Hypertension     Past Surgical History  Procedure Date  . Inguinal hernia repair     right  . Right wrist orif     for fx  . Pancreatic stent placement     Family History  Problem Relation Age of Onset  . Coronary artery disease Neg Hx   . Colon cancer Neg Hx    Social History:  reports that he quit smoking about 20 months ago. He quit smokeless tobacco use about 20 months ago. He reports that he does not drink alcohol or use illicit drugs.  Allergies:  Allergies  Allergen Reactions  . Azithromycin Swelling    Throat swelling, body swelling  . Lisinopril Other (See Comments)    REACTION: facial/neck edema     (Not in a hospital admission)  Results for orders placed during the hospital encounter of 01/30/12 (from the past 48 hour(s))  GLUCOSE, CAPILLARY     Status: Abnormal   Collection Time   01/30/12  8:34 PM      Component Value Range Comment   Glucose-Capillary 263 (*) 70 - 99 (mg/dL)   PROTIME-INR     Status: Normal   Collection Time   01/30/12  8:54 PM      Component Value Range Comment   Prothrombin Time 13.2  11.6 - 15.2 (seconds)    INR 0.98  0.00 - 1.49    APTT     Status: Normal   Collection Time   01/30/12  8:54 PM      Component Value Range Comment   aPTT 26  24 - 37 (seconds)   CBC     Status: Abnormal   Collection Time   01/30/12  8:54 PM      Component Value Range Comment   WBC 12.3 (*) 4.0 - 10.5 (K/uL)    RBC 4.46  4.22 - 5.81 (MIL/uL)    Hemoglobin 13.5  13.0 - 17.0 (g/dL)    HCT 41.3  39.0 - 52.0 (%)    MCV 92.6  78.0 - 100.0 (fL)    MCH 30.3  26.0 - 34.0 (pg)    MCHC 32.7  30.0 - 36.0 (g/dL)    RDW 13.7  11.5 - 15.5 (%)    Platelets  249  150 - 400 (K/uL)   DIFFERENTIAL     Status: Abnormal   Collection Time   01/30/12  8:54 PM      Component Value Range Comment   Neutrophils Relative 72  43 - 77 (%)    Neutro Abs 8.8 (*) 1.7 - 7.7 (K/uL)    Lymphocytes Relative 20  12 - 46 (%)    Lymphs Abs 2.5  0.7 - 4.0 (K/uL)    Monocytes Relative 7  3 - 12 (%)    Monocytes Absolute 0.8  0.1 - 1.0 (K/uL)    Eosinophils Relative 1  0 - 5 (%)    Eosinophils Absolute 0.1  0.0 - 0.7 (K/uL)    Basophils Relative 0  0 - 1 (%)    Basophils Absolute 0.0  0.0 - 0.1 (K/uL)   COMPREHENSIVE METABOLIC PANEL     Status: Abnormal   Collection Time   01/30/12  8:54 PM      Component Value Range Comment   Sodium 135  135 - 145 (mEq/L)    Potassium 4.0  3.5 - 5.1 (mEq/L)    Chloride 101  96 - 112 (mEq/L)    CO2 18 (*) 19 - 32 (mEq/L)    Glucose, Bld 272 (*) 70 - 99 (mg/dL)    BUN 8  6 - 23 (mg/dL)    Creatinine, Ser 0.96  0.50 - 1.35 (mg/dL)    Calcium 9.4  8.4 - 10.5 (mg/dL)    Total Protein 7.3  6.0 - 8.3 (g/dL)    Albumin 3.6  3.5 - 5.2 (g/dL)    AST 22  0 - 37 (U/L)    ALT 24  0 - 53 (U/L)    Alkaline Phosphatase 151 (*) 39 - 117 (U/L)    Total Bilirubin 0.5  0.3 - 1.2 (mg/dL)    GFR calc non Af Amer >90  >90 (mL/min)    GFR calc Af Amer >90  >90 (mL/min)   CK TOTAL AND CKMB     Status: Normal   Collection Time   01/30/12  8:55 PM      Component Value Range Comment   Total CK 162  7 - 232 (U/L)  CK, MB 2.3  0.3 - 4.0 (ng/mL)    Relative Index 1.4  0.0 - 2.5    TROPONIN I     Status: Normal   Collection Time   01/30/12  8:55 PM      Component Value Range Comment   Troponin I <0.30  <0.30 (ng/mL)   POCT I-STAT, CHEM 8     Status: Abnormal   Collection Time   01/30/12  9:13 PM      Component Value Range Comment   Sodium 138  135 - 145 (mEq/L)    Potassium 4.2  3.5 - 5.1 (mEq/L)    Chloride 106  96 - 112 (mEq/L)    BUN 7  6 - 23 (mg/dL)    Creatinine, Ser 0.90  0.50 - 1.35 (mg/dL)    Glucose, Bld 291 (*) 70 - 99 (mg/dL)     Calcium, Ion 1.19  1.12 - 1.32 (mmol/L)    TCO2 20  0 - 100 (mmol/L)    Hemoglobin 15.3  13.0 - 17.0 (g/dL)    HCT 45.0  39.0 - 52.0 (%)    Ct Head Wo Contrast  01/30/2012  *RADIOLOGY REPORT*  Clinical Data: Code stroke, aphasia  CT HEAD WITHOUT CONTRAST  Technique:  Contiguous axial images were obtained from the base of the skull through the vertex without contrast.  Comparison: 09/01/2010  Findings: Examination is degraded by patient motion.  Left occipital encephalomalacia again noted.  There is focal loss of gray-white differentiation and hypodensity of the gyri in the high left parietal lobe, new since the prior study.  Mild asymmetric prominence of the left lateral ventricle as compared to the right is stable.  Diffuse cortical volume loss noted with proportional ventricular prominence.  No acute hemorrhage or mass lesion identified.  No midline shift.  No acute osseous finding.  Orbits and paranasal sinuses are grossly intact incompletely imaged due to patient obliquity in the scanner.  IMPRESSION: High left parietal cortical hypodensity most compatible with acute infarct given the gyri involved and speech difficulties. Mass lesion is felt less likely.  Critical Value/emergent results were called by telephone at the time of interpretation on 01/30/2012  at 8:50 p.m.  to  Dr. Wyvonnia Dusky, who verbally acknowledged these results.  Original Report Authenticated By: Arline Asp, M.D.    Review of Systems  Constitutional: Negative.   HENT: Negative.   Eyes: Negative.   Respiratory: Negative.   Cardiovascular: Negative.   Gastrointestinal: Negative.   Genitourinary: Negative.   Musculoskeletal: Positive for myalgias.  Skin: Positive for rash.       Lower extremity rashes  Neurological: Positive for sensory change, speech change and focal weakness. Negative for loss of consciousness.  Psychiatric/Behavioral: Positive for memory loss and substance abuse. The patient is nervous/anxious.      Blood pressure 108/73, pulse 115, resp. rate 19, height 5' 8.11" (1.73 m), weight 82.5 kg (181 lb 14.1 oz), SpO2 97.00%. Physical Exam  Constitutional: He is oriented to person, place, and time. He appears well-developed and well-nourished.  HENT:  Head: Normocephalic and atraumatic.  Right Ear: External ear normal.  Left Ear: External ear normal.  Nose: Nose normal.  Mouth/Throat: Oropharynx is clear and moist.  Eyes: Conjunctivae and EOM are normal. Pupils are equal, round, and reactive to light.  Neck: Normal range of motion. Neck supple.  Cardiovascular: Normal rate, regular rhythm, normal heart sounds and intact distal pulses.   Respiratory: Effort normal and breath sounds normal.  GI: Soft.  Bowel sounds are normal.  Musculoskeletal: Normal range of motion.  Neurological: He is alert and oriented to person, place, and time. He has normal reflexes. He displays no atrophy and no tremor. No cranial nerve deficit or sensory deficit. He exhibits abnormal muscle tone. He displays seizure activity.       Right sided weakness Upper extremity>Lower extremity, Dysarthria  Skin: Skin is warm and dry. Rash noted.  Psychiatric: He has a normal mood and affect. His behavior is normal.     Assessment/Plan Assessment this is a 56 year old gentleman presenting with acute CVA seen on his head CT scan. Also probably had seizure disorder with acute seizure on arrival. He is a known alcoholic and smoker who has not smoked in a while and has not drunk alcohol according to him for a year. Plan #1 CVA: We will admit the patient to start him on the CVA protocol according to stroke center. We'll get MRI/MRA of the brain carotid Dopplers 2-D echocardiogram will check him for swallow evaluation and a stat aspirin. Once he passes a swallow evaluation we'll also put him on a statin. He will need to be on some anticoagulation like aspirin with or without Plavix after this hospitalization. #2 hypertension patient  will be treated accordingly with antihypertensive but for now we will allow his systolic blood pressure  to be up to 180 due to acute stroke. #3 seizure disorder: We will continue with IV Keppra until patient is able to take by mouth. #4 alcoholic cirrhosis: This seems to be stable at this point. We will observe patient in the hospital accordingly. #5 polysubstance abuse: Patient said he has quit so we will observe him closely again. #6 other chronic medical problems seem stable. We'll continue his home medication as necessary  Marilea Gwynne,LAWAL 01/30/2012, 10:18 PM

## 2012-01-30 NOTE — Consult Note (Signed)
Chief Complaint: Acute mental status changes with focal motor seizure activity, presenting as code stroke.  HPI: Steven Frey is an 56 y.o. male with a history of previous stroke, hypertension seizure disorder and alcohol abuse, presenting with acute onset of mental status changes with confusion at 7:30 PM tonight. Patient developed focal motor seizure activity involving his right arm and leg in route to the hospital in the ambulance. He is a history of seizure disorder and has been on Keppra. CT scan showed old left cerebral infarction as well as an area of probable acute left high parietal ischemic infarction. There is no evidence of hemorrhage. NIH stroke score was 8, including residual weakness from previous stroke. He was not a candidate for thrombolytic therapy with TPA because of seizure activity, as well as what appears to be acute stroke on CT scan. Patient was given 2 mg of Ativan intravenously with no further seizure activity noted.  LSN: 7:30 PM tonight tPA Given: No: Seizure activity MRankin: 1  Past Medical History  Diagnosis Date  . Angioedema     rx requiring intubation/vent support suspected secondary to ACE1 (but also on zithromax) 5/09 hospitalization   . Smoker   . ETOH abuse   . Chronic pancreatitis     (10/08 [facility]). admx 02/2010 pseudocyst aspirated during admxn. pancreatic dual stent placement at [facility] 11/11  . Cirrhosis     due to ETOH with recent hepatic abcess and possible HCC.   . Gastritis     alcohol induced  . Depression     no hx of meds  . Elevated BP     boarderline elevation in BP in past  . VF (ventricular fibrillation)     arrest 6/11. successfully rescucitated w/prolinged hospitalization due to respitatory failure. QT prolongation during cooling, now resolved  . History of stroke   . 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 Anxiety   . Seizures   . Edema   . Abscess of liver   . Anoxic brain damage   .  Anemia   . Other convulsions   . Hypertension     Family History  Problem Relation Age of Onset  . Coronary artery disease Neg Hx   . Colon cancer Neg Hx      Medications: Prior to Admission:  Coreg 3.125 mg twice a day Flexeril 10 mg 3 times a day when necessary muscle spasms Nexium 40 mg per day Folic acid 1 mg per day Niferex 150 mg twice a day Keppra 1000 mg in the morning and 500 mg at bedtime Creon one capsule per day Ativan 1 mg every 8 hours Multivitamin 1 per day Nitroglycerin 0.4 mg every 5 minutes when necessary chest pain Zofran 4 mg every 8 hours when necessary nausea OxyContin 10 mg once a day as needed for pain Percocet 1-2 every 4 hours when necessary pain Carafate 1 g 3 times a day Thiamine 100 mg per day Vitamin B12 1000 mcg per day   Physical Examination: Blood pressure 108/73, pulse 115, resp. rate 19, height 5' 8.11" (1.73 m), weight 82.5 kg (181 lb 14.1 oz), SpO2 97.00%.  Neurologic Examination: Mental Status: Lethargic but oriented to place, and to a lesser extent time.  Speech was slightly slurred without evidence of aphasia. Able to follow commands with some difficulty. Cranial Nerves: II-Visual fields were normal. III/IV/VI-Pupils were equal and reacted. Extraocular movements were full and conjugate.    V/VII-no facial numbness; mild right lower facial weakness.  VIII-normal. X-slightly dysarthric speech. XII-midline tongue extension Motor: Moderately severe right with flexion contractures of the right upper extremity Sensory: Normal throughout. Deep Tendon Reflexes: 2+ and symmetric. Plantars: Flexor bilaterally Cerebellar: Normal finger-to-nose testing on the right. Carotid auscultation: Normal   Ct Head Wo Contrast  01/30/2012  *RADIOLOGY REPORT*  Clinical Data: Code stroke, aphasia  CT HEAD WITHOUT CONTRAST  Technique:  Contiguous axial images were obtained from the base of the skull through the vertex without contrast.  Comparison:  09/01/2010  Findings: Examination is degraded by patient motion.  Left occipital encephalomalacia again noted.  There is focal loss of gray-white differentiation and hypodensity of the gyri in the high left parietal lobe, new since the prior study.  Mild asymmetric prominence of the left lateral ventricle as compared to the right is stable.  Diffuse cortical volume loss noted with proportional ventricular prominence.  No acute hemorrhage or mass lesion identified.  No midline shift.  No acute osseous finding.  Orbits and paranasal sinuses are grossly intact incompletely imaged due to patient obliquity in the scanner.  IMPRESSION: High left parietal cortical hypodensity most compatible with acute infarct given the gyri involved and speech difficulties. Mass lesion is felt less likely.  Critical Value/emergent results were called by telephone at the time of interpretation on 01/30/2012  at 8:50 p.m.  to  Dr. Wyvonnia Dusky, who verbally acknowledged these results.  Original Report Authenticated By: Arline Asp, M.D.    Assessment: 56 y.o. male presenting with partial seizure activity including mental status changes as well as right focal motor seizure activity, as well as what appears to be in acute left parietal ischemic stroke.  Stroke Risk Factors - hypertension  Plan: 1. HgbA1c, fasting lipid panel 2. MRI, MRA  of the brain without contrast 3. PT consult, OT consult, Speech consult 4. Echocardiogram 5. Carotid dopplers 6. Prophylactic therapy-Antiplatelet med: Aspirin - 325 mg per day 7. Risk factor modification 8. Telemetry monitoring 9. Increase Keppra to 1000 mg twice a day.  C.R. Nicole Kindred, MD [facility] [phone]  01/30/2012, 10:01 PM

## 2012-01-30 NOTE — ED Notes (Signed)
Transported by Delia Chimes

## 2012-01-30 NOTE — ED Notes (Signed)
Pt spilled his urine. Unable to collect. Will continue to monitor.

## 2012-01-31 ENCOUNTER — Inpatient Hospital Stay (HOSPITAL_COMMUNITY): Payer: Medicaid Other

## 2012-01-31 DIAGNOSIS — I6789 Other cerebrovascular disease: Secondary | ICD-10-CM

## 2012-01-31 DIAGNOSIS — I6350 Cerebral infarction due to unspecified occlusion or stenosis of unspecified cerebral artery: Secondary | ICD-10-CM

## 2012-01-31 LAB — RAPID URINE DRUG SCREEN, HOSP PERFORMED
Amphetamines: NOT DETECTED
Barbiturates: NOT DETECTED
Benzodiazepines: NOT DETECTED
Cocaine: NOT DETECTED
Opiates: NOT DETECTED
Tetrahydrocannabinol: NOT DETECTED

## 2012-01-31 LAB — LIPID PANEL
Cholesterol: 168 mg/dL (ref 0–200)
HDL: 40 mg/dL (ref >39)
LDL Cholesterol: 104 mg/dL — ABNORMAL HIGH (ref 0–99)
Total CHOL/HDL Ratio: 4.2 RATIO
Triglycerides: 122 mg/dL (ref <150)
VLDL: 24 mg/dL (ref 0–40)

## 2012-01-31 LAB — GLUCOSE, CAPILLARY
Glucose-Capillary: 281 mg/dL — ABNORMAL HIGH (ref 70–99)
Glucose-Capillary: 250 mg/dL — ABNORMAL HIGH (ref 70–99)
Glucose-Capillary: 296 mg/dL — ABNORMAL HIGH (ref 70–99)
Glucose-Capillary: 231 mg/dL — ABNORMAL HIGH (ref 70–99)

## 2012-01-31 LAB — HEMOGLOBIN A1C
Hgb A1c MFr Bld: 10.1 % — ABNORMAL HIGH (ref <5.7)
Mean Plasma Glucose: 243 mg/dL — ABNORMAL HIGH (ref <117)

## 2012-01-31 LAB — MRSA PCR SCREENING: MRSA by PCR: POSITIVE — AB

## 2012-01-31 MED ORDER — OXYCODONE HCL 10 MG PO TB12
10.0000 mg | ORAL_TABLET | Freq: Every day | ORAL | Status: DC
  Administered 2012-01-31 – 2012-02-02 (×3): 10 mg via ORAL
  Filled 2012-01-31 (×3): qty 1

## 2012-01-31 MED ORDER — ENOXAPARIN SODIUM 30 MG/0.3ML ~~LOC~~ SOLN
30.0000 mg | SUBCUTANEOUS | Status: DC
  Administered 2012-01-31 – 2012-02-02 (×3): 30 mg via SUBCUTANEOUS
  Filled 2012-01-31 (×3): qty 0.3

## 2012-01-31 MED ORDER — SODIUM CHLORIDE 0.9 % IV SOLN
1000.0000 mg | Freq: Two times a day (BID) | INTRAVENOUS | Status: DC
  Filled 2012-01-31 (×2): qty 10

## 2012-01-31 MED ORDER — LEVETIRACETAM 500 MG PO TABS
1000.0000 mg | ORAL_TABLET | Freq: Two times a day (BID) | ORAL | Status: DC
  Administered 2012-01-31: 1000 mg via ORAL
  Filled 2012-01-31 (×2): qty 2

## 2012-01-31 MED ORDER — CYCLOBENZAPRINE HCL 10 MG PO TABS
10.0000 mg | ORAL_TABLET | Freq: Three times a day (TID) | ORAL | Status: DC | PRN
  Administered 2012-01-31 – 2012-02-01 (×3): 10 mg via ORAL
  Filled 2012-01-31 (×3): qty 1

## 2012-01-31 MED ORDER — SIMVASTATIN 10 MG PO TABS
10.0000 mg | ORAL_TABLET | Freq: Every day | ORAL | Status: DC
  Administered 2012-01-31 – 2012-02-01 (×2): 10 mg via ORAL
  Filled 2012-01-31 (×3): qty 1

## 2012-01-31 MED ORDER — LEVETIRACETAM 500 MG PO TABS
500.0000 mg | ORAL_TABLET | Freq: Two times a day (BID) | ORAL | Status: DC
  Administered 2012-01-31: 500 mg via ORAL
  Filled 2012-01-31 (×3): qty 1

## 2012-01-31 MED ORDER — INSULIN ASPART 100 UNIT/ML ~~LOC~~ SOLN
0.0000 [IU] | Freq: Three times a day (TID) | SUBCUTANEOUS | Status: DC
  Administered 2012-01-31: 5 [IU] via SUBCUTANEOUS
  Administered 2012-01-31 – 2012-02-01 (×5): 3 [IU] via SUBCUTANEOUS
  Administered 2012-02-02: 08:00:00 via SUBCUTANEOUS
  Administered 2012-02-02: 1 [IU] via SUBCUTANEOUS

## 2012-01-31 NOTE — Progress Notes (Signed)
Stroke Team Progress Note  HISTORY Steven Frey is an 56 y.o. male with a history of previous stroke, hypertension seizure disorder and alcohol abuse, presenting with acute onset of mental status changes with confusion at 7:30 PM 01/30/2012. Patient developed focal motor seizure activity involving his right arm and leg in route to the hospital in the ambulance. He is a history of seizure disorder and has been on Keppra. CT scan showed old left cerebral infarction as well as an area of probable acute left high parietal ischemic infarction. There is no evidence of hemorrhage. NIH stroke score was 8, including residual weakness from previous stroke. He was not a candidate for thrombolytic therapy with TPA because of seizure activity, as well as what appears to be acute stroke on CT scan. Patient was given 2 mg of Ativan intravenously with no further seizure activity noted. He was admitted for further evaluation and treatment.  SUBJECTIVE No family is at the bedside.  Overall he feels his condition is completely resolved. No further seizures.  OBJECTIVE Most recent Vital Signs: Filed Vitals:   01/31/12 0400 01/31/12 0600 01/31/12 0800 01/31/12 1000  BP: 104/69 95/63 96/67  151/68  Pulse: 93 87 92 97  Temp: 98.1 F (36.7 C) 98.5 F (36.9 C)    TempSrc: Oral Oral    Resp: 18 18 18 18   Height:      Weight:      SpO2: 96% 98% 94% 96%   CBG (last 3)  Basename 01/31/12 0843 01/30/12 2034  GLUCAP 281* 263*   Intake/Output from previous day:   IV Fluid Intake:     . sodium chloride 100 mL/hr at 01/31/12 0715   MEDICATIONS    . aspirin  300 mg Rectal Daily   Or  . aspirin  325 mg Oral Daily  . enoxaparin (LOVENOX) injection  30 mg Subcutaneous Q24H  . insulin aspart  0-9 Units Subcutaneous TID WC  . levetiracetam  500 mg Intravenous Once  . levETIRAcetam  1,000 mg Oral BID  . LORazepam  2 mg Intravenous Once  . simvastatin  10 mg Oral q1800  . DISCONTD: enoxaparin  30 mg Subcutaneous  Q24H  . DISCONTD: levetiracetam  1,000 mg Intravenous BID  . DISCONTD: oxyCODONE-acetaminophen  2 tablet Oral Once   PRN:  HYDROmorphone (DILAUDID) injection, ondansetron (ZOFRAN) IV, senna-docusate  CLINICALLY SIGNIFICANT STUDIES CBC    Component Value Date/Time   WBC 12.3* 01/30/2012 2054   RBC 4.46 01/30/2012 2054   HGB 15.3 01/30/2012 2113   HCT 45.0 01/30/2012 2113   PLT 249 01/30/2012 2054   MCV 92.6 01/30/2012 2054   MCH 30.3 01/30/2012 2054   MCHC 32.7 01/30/2012 2054   RDW 13.7 01/30/2012 2054   LYMPHSABS 2.5 01/30/2012 2054   MONOABS 0.8 01/30/2012 2054   EOSABS 0.1 01/30/2012 2054   BASOSABS 0.0 01/30/2012 2054   CMP    Component Value Date/Time   NA 138 01/30/2012 2113   K 4.2 01/30/2012 2113   CL 106 01/30/2012 2113   CO2 18* 01/30/2012 2054   GLUCOSE 291* 01/30/2012 2113   BUN 7 01/30/2012 2113   CREATININE 0.90 01/30/2012 2113   CREATININE 1.27 10/26/2011 1805   CALCIUM 9.4 01/30/2012 2054   PROT 7.3 01/30/2012 2054   ALBUMIN 3.6 01/30/2012 2054   AST 22 01/30/2012 2054   ALT 24 01/30/2012 2054   ALKPHOS 151* 01/30/2012 2054   BILITOT 0.5 01/30/2012 2054   GFRNONAA >90 01/30/2012 2054   GFRAA >90 01/30/2012  2054   COAGS Lab Results  Component Value Date   INR 0.98 01/30/2012   INR 0.98 09/26/2011   INR 1.14 07/29/2010   Lipid Panel    Component Value Date/Time   CHOL 168 01/31/2012 0710   TRIG 122 01/31/2012 0710   HDL 40 01/31/2012 0710   CHOLHDL 4.2 01/31/2012 0710   VLDL 24 01/31/2012 0710   LDLCALC 104* 01/31/2012 0710   HgbA1C  Lab Results  Component Value Date   HGBA1C  Value: 4.6 (NOTE)                                                                       According to the ADA Clinical Practice Recommendations for 2011, when HbA1c is used as a screening test:   >=6.5%   Diagnostic of Diabetes Mellitus           (if abnormal result  is confirmed)  5.7-6.4%   Increased risk of developing Diabetes Mellitus  References:Diagnosis and Classification of Diabetes  [place],Diabetes [id number] 1):S62-S69 and Standards of Medical Care in         Diabetes - 2011,Diabetes [id number]  (Suppl 1):S11-S61. 04/21/2010   Cardiac Panel (last 3 results)  Basename 01/30/12 2055  CKTOTAL 162  CKMB 2.3  TROPONINI <0.30  RELINDX 1.4   Urinalysis    Component Value Date/Time   COLORURINE YELLOW 08/24/2010 [place] 08/24/2010 1754   LABSPEC 1.022 08/24/2010 1754   PHURINE 5.5 08/24/2010 1754   GLUCOSEU NEGATIVE 08/24/2010 1754   HGBUR NEGATIVE 08/24/2010 1754   BILIRUBINUR SMALL* 08/24/2010 [place] 08/24/2010 1754   PROTEINUR 30* 08/24/2010 1754   UROBILINOGEN 1.0 08/24/2010 1754   NITRITE NEGATIVE 08/24/2010 1754   LEUKOCYTESUR NEGATIVE 08/24/2010 1754   Urine Drug Screen    Component Value Date/Time   LABOPIA NONE DETECTED 01/31/2012 0014   LABOPIA NEG 03/09/2010 2157   COCAINSCRNUR NONE DETECTED 01/31/2012 0014   COCAINSCRNUR NEG 07/07/2010 2141   LABBENZ NONE DETECTED 01/31/2012 0014   LABBENZ NEG 07/07/2010 2141   AMPHETMU NONE DETECTED 01/31/2012 0014   AMPHETMU NEG 07/07/2010 2141   THCU NONE DETECTED 01/31/2012 0014   LABBARB NONE DETECTED 01/31/2012 0014    Alcohol Level    Component Value Date/Time   ETH  Value: <5        LOWEST DETECTABLE LIMIT FOR SERUM ALCOHOL IS 5 mg/dL FOR MEDICAL PURPOSES ONLY 08/24/2010 1811      Ct Head Wo Contrast  01/30/2012  *RADIOLOGY REPORT*  Clinical Data: Code stroke, aphasia  CT HEAD WITHOUT CONTRAST  Technique:  Contiguous axial images were obtained from the base of the skull through the vertex without contrast.  Comparison: 09/01/2010  Findings: Examination is degraded by patient motion.  Left occipital encephalomalacia again noted.  There is focal loss of gray-white differentiation and hypodensity of the gyri in the high left parietal lobe, new since the prior study.  Mild asymmetric prominence of the left lateral ventricle as compared to the right is stable.  Diffuse  cortical volume loss noted with proportional ventricular prominence.  No acute hemorrhage or mass lesion identified.  No midline shift.  No acute osseous finding.  Orbits and paranasal sinuses are grossly intact incompletely  imaged due to patient obliquity in the scanner.  IMPRESSION: High left parietal cortical hypodensity most compatible with acute infarct given the gyri involved and speech difficulties. Mass lesion is felt less likely.  Critical Value/emergent results were called by telephone at the time of interpretation on 01/30/2012  at 8:50 p.m.  to  Dr. Wyvonnia Dusky, who verbally acknowledged these results.  Original Report Authenticated By: Arline Asp, M.D.   Mr Brain Wo Contrast  01/31/2012  *RADIOLOGY REPORT*  Clinical Data:  Right-sided weakness with aphasia.  Hypertension. Cirrhosis.  Suspected acute but ill-defined cerebrovascular disease.  MRI HEAD WITHOUT CONTRAST MRA HEAD WITHOUT CONTRAST  Technique:  Multiplanar, multiecho pulse sequences of the brain and surrounding structures were obtained without intravenous contrast. Angiographic images of the head were obtained using MRA technique without contrast.  Comparison:  CT head 01/30/2012.  Also CT head 09/01/2010.  Prior MR head 03/29/2010.  MRI HEAD  Findings:  No acute stroke, intracranial hemorrhage, mass lesion, hydrocephalus, or extra-axial fluid.  There is an old left parietal cortical and subcortical infarction without visible associated hemorrhage.  There is gliosis and encephalomalacia.  There is no midline shift.  Premature atrophy is present.  There is chronic microvascular ischemic change throughout the periventricular and subcortical white matter.  The major intracranial vascular structures appear patent.  Extracranial soft tissues grossly unremarkable.  Pituitary and cerebellar tonsils are unremarkable.  IMPRESSION: Remote left parietal infarction.  Atrophy and small vessel disease.  MRA HEAD  Findings: Right internal carotid  artery demonstrates slight atheromatous outpouching inferior cavernous segment.  No focal stenosis in this location or elsewhere.  Left internal carotid artery normal throughout its skull base, and cavernous segments.  Slight nonstenotic narrowing supraclinoid ICA.  Basilar artery slightly hypoplastic, perhaps secondary to fetal right PCA origin. No focal stenosis.  Vertebrals codominant and widely patent.  No focal narrowing of the anterior, middle, or posterior cerebral arteries. No visible cerebellar branch occlusion.  Slight patient motion obscures detail of the distal MCA and PCA branches.  IMPRESSION: No proximal flow reducing lesion is observed.  See discussion above.  Original Report Authenticated By: Staci Righter, M.D.   Mr Mra Head/brain Wo Cm  01/31/2012  *RADIOLOGY REPORT*  Clinical Data:  Right-sided weakness with aphasia.  Hypertension. Cirrhosis.  Suspected acute but ill-defined cerebrovascular disease.  MRI HEAD WITHOUT CONTRAST MRA HEAD WITHOUT CONTRAST  Technique:  Multiplanar, multiecho pulse sequences of the brain and surrounding structures were obtained without intravenous contrast. Angiographic images of the head were obtained using MRA technique without contrast.  Comparison:  CT head 01/30/2012.  Also CT head 09/01/2010.  Prior MR head 03/29/2010.  MRI HEAD  Findings:  No acute stroke, intracranial hemorrhage, mass lesion, hydrocephalus, or extra-axial fluid.  There is an old left parietal cortical and subcortical infarction without visible associated hemorrhage.  There is gliosis and encephalomalacia.  There is no midline shift.  Premature atrophy is present.  There is chronic microvascular ischemic change throughout the periventricular and subcortical white matter.  The major intracranial vascular structures appear patent.  Extracranial soft tissues grossly unremarkable.  Pituitary and cerebellar tonsils are unremarkable.  IMPRESSION: Remote left parietal infarction.  Atrophy and small  vessel disease.  MRA HEAD  Findings: Right internal carotid artery demonstrates slight atheromatous outpouching inferior cavernous segment.  No focal stenosis in this location or elsewhere.  Left internal carotid artery normal throughout its skull base, and cavernous segments.  Slight nonstenotic narrowing supraclinoid ICA.  Basilar artery slightly hypoplastic,  perhaps secondary to fetal right PCA origin. No focal stenosis.  Vertebrals codominant and widely patent.  No focal narrowing of the anterior, middle, or posterior cerebral arteries. No visible cerebellar branch occlusion.  Slight patient motion obscures detail of the distal MCA and PCA branches.  IMPRESSION: No proximal flow reducing lesion is observed.  See discussion above.  Original Report Authenticated By: Staci Righter, M.D.   Physical Exam    Pleasant middle aged african [place] male not in distress.Awake alert. Afebrile. Head is nontraumatic. Neck is supple without bruit. Hearing is normal. Cardiac exam no murmur or gallop. Lungs are clear to auscultation. Distal pulses are well felt.   Neurological exam ; Awake alert oriented x 3 normal speech and language. Diminished attention, recall and short term memory.. No face asymmetry. Tongue midline. No drift. No focal limb weakness.. Normal sensation . Normal coordination.  ASSESSMENT Mr. Samaj Doehring is a 56 y.o. male with a seizure, no acute stroke per MRI. Pt with history of seizures. Stopped taking his medication. Seizures now resolved. Placed on Keppra 1000 BID. Stroke workup not needed.  Hospital day # 1  TREATMENT/PLAN -Continue Keppra. Decrease to 500mg  BID -Stroke Service will sign off. Follow up by [facility] as needed.  Steffanie Rainwater, ANP-BC, GNP-BC [facility] Pager: [phone] 01/31/2012 11:42 AM  Dr. Antony Contras, [place] Director, has personally reviewed chart, pertinent data, examined the patient and developed the plan of  care.

## 2012-01-31 NOTE — Progress Notes (Signed)
VASCULAR LAB PRELIMINARY  PRELIMINARY  PRELIMINARY  PRELIMINARY  Carotid duplex  completed.    Preliminary report:  Bilateral:  No evidence of hemodynamically significant internal carotid artery stenosis.   Vertebral artery flow is antegrade.      Judithann Sauger, RVT 01/31/2012, 10:41 AM

## 2012-01-31 NOTE — Progress Notes (Signed)
Received referral for this patient.  Question new onset diabetes per RN.  Noted Dr. Melrose Nakayama has ordered a Hemoglobin A1C level for this patient.  Currently awaiting results.  Last A1C on record was 4.6% (04/21/2010).  Noted patient has history of pancreatitis, ETOH, polysubstance abuse.  Spoke to patient about what a Hemoglobin A1C is and what it measures.  Discussed the fact that patient has been having hyperglycemia here in the hospital and that the healthcare team is concerned he may have underlying glucose issues.  Once A1C results are back we will know better what his glucose control has been over the last 3 months.  Also discussed with this patient the risk factors he has for development of Type 2 diabetes.  Gave patient educational pamphlets on Hemoglobin A1C and Diabetes, Am I at risk?Damaris Schooner with RN caring for this patient.  Asked her to please follow up with A1C results.  If the physician diagnoses this patient with diabetes, please begin basic education at the bedside with the DM videos ([id number]) on patient education network and The Living Well with Diabetes patient workbook from pharmacy.  It appears that patient does not have health insurance coverage nor a primary MD.  Needs care management consult.  Will follow. Wyn Quaker RN, MSN, CDE Diabetes Coordinator Inpatient Diabetes Program [phone]

## 2012-01-31 NOTE — Progress Notes (Signed)
Utilization Review Completed.Donne Anon T4/23/2013

## 2012-01-31 NOTE — Evaluation (Signed)
Physical Therapy Evaluation Patient Details Name: Steven Frey MRN: [id number] DOB: [birth year] Today's Date: 01/31/2012 Time: [id number] PT Time Calculation (min): 26 min  PT Assessment / Plan / Recommendation Clinical Impression  Patient presents with increased weakness of right side. His NIHSS was 0 on admission. Radiological testing reveals a likely ischemic left parietal CVA. Patient presents with balance and strength issues that will likely require him to use his cane at all times at the present time. We will follow acutely to maximize strength and balance to decrease fall risk at discharge.    PT Assessment  Patient needs continued PT services    Follow Up Recommendations  Home health PT;Supervision - Intermittent    Equipment Recommendations  None recommended by PT    Frequency Min 4X/week    Precautions / Restrictions Precautions Precautions: Fall   Pertinent Vitals/Pain Patient with right sided chronic pain - RN aware.      Mobility  Bed Mobility Bed Mobility: Rolling Right;Right Sidelying to Sit;Sit to Supine;Sitting - Scoot to Edge of Bed Rolling Right: 6: Modified independent (Device/Increase time) Right Sidelying to Sit: 6: Modified independent (Device/Increase time) Sitting - Scoot to Edge of Bed: 6: Modified independent (Device/Increase time) Sit to Supine: 6: Modified independent (Device/Increase time) Transfers Transfers: Sit to Stand;Stand to Sit Sit to Stand: 6: Modified independent (Device/Increase time);Without upper extremity assist;With upper extremity assist;From bed Stand to Sit: Without upper extremity assist;With upper extremity assist;7: Independent;To bed Ambulation/Gait Ambulation/Gait Assistance: 4: Min guard Ambulation Distance (Feet): 180 Feet Assistive device: None Ambulation/Gait Assistance Details: 2 episodes of right leg giving way. Patient able to self correct to prevent falls on both occassions, but I would recommend he use his cane  at discharge. Gait Pattern: Step-through pattern;Decreased stride length;Decreased hip/knee flexion - right;Decreased stance time - right;Right circumduction Modified Rankin (Stroke Patients Only) Modified Rankin: Moderately severe disability (pre-morbid 2)     PT Goals Acute Rehab PT Goals PT Goal Formulation: With patient Time For Goal Achievement: 02/07/12 Potential to Achieve Goals: Good Pt will Ambulate: >150 feet;with modified independence;with least restrictive assistive device PT Goal: Ambulate - Progress: Goal set today Pt will Go Up / Down Stairs: 3-5 stairs;with rail(s);with modified independence PT Goal: Up/Down Stairs - Progress: Goal set today Pt will Perform Home Exercise Program: Independently PT Goal: Perform Home Exercise Program - Progress: Goal set today Additional Goals Additional Goal #1: Increase Berg balance score to at least 44 to demonstrate a statistically signifcant change in the Berg balance test and thereby decrease his future falls risk. PT Goal: Additional Goal #1 - Progress: Goal set today  Visit Information  Last PT Received On: 01/31/12 Assistance Needed: +1    Subjective Data  Subjective: Patient reports increased weakness of right side Patient Stated Goal: Return to increased activity   Prior [place] Lives With:  (father) Available Help at Discharge: Family Type of Home: Mobile home Home Access: Stairs to enter Technical brewer of Steps: 3 Entrance Stairs-Rails: Right;Left;Can reach both Home Layout: One level Bathroom Shower/Tub: Chiropodist: Standard Bathroom Accessibility: No Home Adaptive Equipment: Straight cane Additional Comments: Uses cane intermittently more for outdoors Prior Function Level of Independence: Needs assistance Needs Assistance: Meal Prep;Light Housekeeping Meal Prep:  (at times secondary to slow so Dad helps) Light Housekeeping:  (at times secondary to slow so Dad  helps) Able to Take Stairs?: Yes Driving: No Vocation: On disability Comments: Patient states that if Dad was not willing to help he could  do all activties in the home, but his Dad currently will take over the task. Communication Communication: Expressive difficulties Dominant Hand: Right    Cognition  Overall Cognitive Status: Appears within functional limits for tasks assessed/performed Arousal/Alertness: Awake/alert Orientation Level: Appears intact for tasks assessed Behavior During Session: [facility] for tasks performed    Extremity/Trunk Assessment Right Lower Extremity Assessment RLE ROM/Strength/Tone: Deficits RLE ROM/Strength/Tone Deficits: Knww flexion and extension 4/5. Hip in all planes 4/5 RLE Sensation: Deficits RLE Sensation Deficits: Decreased light touch right lower extremity RLE Coordination: Deficits RLE Coordination Deficits: Decreased motor control Left Lower Extremity Assessment LLE ROM/Strength/Tone: Within functional levels LLE Sensation: WFL - Proprioception;WFL - Light Touch Trunk Assessment Trunk Assessment: Normal   Balance Standardized Balance Assessment Standardized Balance Assessment: Berg Balance Test Berg Balance Test Sit to Stand: Able to stand using hands after several tries Standing Unsupported: Able to stand safely 2 minutes Sitting with Back Unsupported but Feet Supported on Floor or Stool: Able to sit safely and securely 2 minutes Stand to Sit: Sits safely with minimal use of hands Transfers: Able to transfer safely, minor use of hands Standing Unsupported with Eyes Closed: Able to stand 10 seconds safely Standing Ubsupported with Feet Together: Able to place feet together independently and stand for 1 minute with supervision From Standing, Reach Forward with Outstretched Arm: Reaches forward but needs supervision From Standing Position, Pick up Object from Floor: Able to pick up shoe, needs supervision From Standing Position, Turn to Look  Behind Over each Shoulder: Looks behind from both sides and weight shifts well Turn 360 Degrees: Needs close supervision or verbal cueing Standing Unsupported, Alternately Place Feet on Step/Stool: Able to complete >2 steps/needs minimal assist Standing Unsupported, One Foot in Front: Loses balance while stepping or standing Standing on One Leg: Tries to lift leg/unable to hold 3 seconds but remains standing independently Total Score: 36   End of Session PT - End of Session Equipment Utilized During Treatment: Gait belt Activity Tolerance: Patient tolerated treatment well Patient left: in bed;with call bell/phone within reach;with bed alarm set Nurse Communication: Mobility status   Jake Bathe, PT  Pager [phone]  01/31/2012, 11:30 AM

## 2012-01-31 NOTE — Progress Notes (Signed)
Subjective: Patient seen and examined ,C/O feeling hungry ,otherwise no complaints  Objective: Vital signs in last 24 hours: Temp:  [97.9 F (36.6 C)-98.5 F (36.9 C)] 98.5 F (36.9 C) (04/23 0600) Pulse Rate:  [87-116] 87  (04/23 0600) Resp:  [12-19] 18  (04/23 0600) BP: (95-140)/(63-101) 95/63 mmHg (04/23 0600) SpO2:  [93 %-99 %] 98 % (04/23 0600) Weight:  [77.6 kg (171 lb 1.2 oz)-82.5 kg (181 lb 14.1 oz)] 77.6 kg (171 lb 1.2 oz) (04/22 2330) Weight change:  Last BM Date: 01/29/12  Intake/Output from previous day:       Physical Exam: General: Alert, awake, oriented x3, in no acute distress. HEENT: No bruits, no goiter. Heart: Regular rate and rhythm, without murmurs, rubs, gallops. Lungs: Clear to auscultation bilaterally. Abdomen: Soft, nontender, nondistended, positive bowel sounds. Extremities: No clubbing cyanosis or edema with positive pedal pulses. Neuro: Grossly intact, nonfocal.    Lab Results: Results for orders placed during the hospital encounter of 01/30/12 (from the past 24 hour(s))  GLUCOSE, CAPILLARY     Status: Abnormal   Collection Time   01/30/12  8:34 PM      Component Value Range   Glucose-Capillary 263 (*) 70 - 99 (mg/dL)  PROTIME-INR     Status: Normal   Collection Time   01/30/12  8:54 PM      Component Value Range   Prothrombin Time 13.2  11.6 - 15.2 (seconds)   INR 0.98  0.00 - 1.49   APTT     Status: Normal   Collection Time   01/30/12  8:54 PM      Component Value Range   aPTT 26  24 - 37 (seconds)  CBC     Status: Abnormal   Collection Time   01/30/12  8:54 PM      Component Value Range   WBC 12.3 (*) 4.0 - 10.5 (K/uL)   RBC 4.46  4.22 - 5.81 (MIL/uL)   Hemoglobin 13.5  13.0 - 17.0 (g/dL)   HCT 41.3  39.0 - 52.0 (%)   MCV 92.6  78.0 - 100.0 (fL)   MCH 30.3  26.0 - 34.0 (pg)   MCHC 32.7  30.0 - 36.0 (g/dL)   RDW 13.7  11.5 - 15.5 (%)   Platelets 249  150 - 400 (K/uL)  DIFFERENTIAL     Status: Abnormal   Collection Time   01/30/12  8:54 PM      Component Value Range   Neutrophils Relative 72  43 - 77 (%)   Neutro Abs 8.8 (*) 1.7 - 7.7 (K/uL)   Lymphocytes Relative 20  12 - 46 (%)   Lymphs Abs 2.5  0.7 - 4.0 (K/uL)   Monocytes Relative 7  3 - 12 (%)   Monocytes Absolute 0.8  0.1 - 1.0 (K/uL)   Eosinophils Relative 1  0 - 5 (%)   Eosinophils Absolute 0.1  0.0 - 0.7 (K/uL)   Basophils Relative 0  0 - 1 (%)   Basophils Absolute 0.0  0.0 - 0.1 (K/uL)  COMPREHENSIVE METABOLIC PANEL     Status: Abnormal   Collection Time   01/30/12  8:54 PM      Component Value Range   Sodium 135  135 - 145 (mEq/L)   Potassium 4.0  3.5 - 5.1 (mEq/L)   Chloride 101  96 - 112 (mEq/L)   CO2 18 (*) 19 - 32 (mEq/L)   Glucose, Bld 272 (*) 70 - 99 (mg/dL)   BUN  8  6 - 23 (mg/dL)   Creatinine, Ser 0.96  0.50 - 1.35 (mg/dL)   Calcium 9.4  8.4 - 10.5 (mg/dL)   Total Protein 7.3  6.0 - 8.3 (g/dL)   Albumin 3.6  3.5 - 5.2 (g/dL)   AST 22  0 - 37 (U/L)   ALT 24  0 - 53 (U/L)   Alkaline Phosphatase 151 (*) 39 - 117 (U/L)   Total Bilirubin 0.5  0.3 - 1.2 (mg/dL)   GFR calc non Af Amer >90  >90 (mL/min)   GFR calc Af Amer >90  >90 (mL/min)  CK TOTAL AND CKMB     Status: Normal   Collection Time   01/30/12  8:55 PM      Component Value Range   Total CK 162  7 - 232 (U/L)   CK, MB 2.3  0.3 - 4.0 (ng/mL)   Relative Index 1.4  0.0 - 2.5   TROPONIN I     Status: Normal   Collection Time   01/30/12  8:55 PM      Component Value Range   Troponin I <0.30  <0.30 (ng/mL)  POCT I-STAT, CHEM 8     Status: Abnormal   Collection Time   01/30/12  9:13 PM      Component Value Range   Sodium 138  135 - 145 (mEq/L)   Potassium 4.2  3.5 - 5.1 (mEq/L)   Chloride 106  96 - 112 (mEq/L)   BUN 7  6 - 23 (mg/dL)   Creatinine, Ser 0.90  0.50 - 1.35 (mg/dL)   Glucose, Bld 291 (*) 70 - 99 (mg/dL)   Calcium, Ion 1.19  1.12 - 1.32 (mmol/L)   TCO2 20  0 - 100 (mmol/L)   Hemoglobin 15.3  13.0 - 17.0 (g/dL)   HCT 45.0  39.0 - 52.0 (%)  URINE RAPID DRUG  SCREEN (HOSP PERFORMED)     Status: Normal   Collection Time   01/31/12 12:14 AM      Component Value Range   Opiates NONE DETECTED  NONE DETECTED    Cocaine NONE DETECTED  NONE DETECTED    Benzodiazepines NONE DETECTED  NONE DETECTED    Amphetamines NONE DETECTED  NONE DETECTED    Tetrahydrocannabinol NONE DETECTED  NONE DETECTED    Barbiturates NONE DETECTED  NONE DETECTED   MRSA PCR SCREENING     Status: Abnormal   Collection Time   01/31/12 12:25 AM      Component Value Range   MRSA by PCR POSITIVE (*) NEGATIVE     Studies/Results: Ct Head Wo Contrast  01/30/2012  *RADIOLOGY REPORT*  Clinical Data: Code stroke, aphasia  CT HEAD WITHOUT CONTRAST  Technique:  Contiguous axial images were obtained from the base of the skull through the vertex without contrast.  Comparison: 09/01/2010  Findings: Examination is degraded by patient motion.  Left occipital encephalomalacia again noted.  There is focal loss of gray-white differentiation and hypodensity of the gyri in the high left parietal lobe, new since the prior study.  Mild asymmetric prominence of the left lateral ventricle as compared to the right is stable.  Diffuse cortical volume loss noted with proportional ventricular prominence.  No acute hemorrhage or mass lesion identified.  No midline shift.  No acute osseous finding.  Orbits and paranasal sinuses are grossly intact incompletely imaged due to patient obliquity in the scanner.  IMPRESSION: High left parietal cortical hypodensity most compatible with acute infarct given the gyri involved  and speech difficulties. Mass lesion is felt less likely.  Critical Value/emergent results were called by telephone at the time of interpretation on 01/30/2012  at 8:50 p.m.  to  Dr. Wyvonnia Dusky, who verbally acknowledged these results.  Original Report Authenticated By: Arline Asp, M.D.    Medications:    . aspirin  300 mg Rectal Daily   Or  . aspirin  325 mg Oral Daily  . enoxaparin (LOVENOX)  injection  30 mg Subcutaneous Q24H  . levetiracetam  500 mg Intravenous Once  . LORazepam  2 mg Intravenous Once  . DISCONTD: enoxaparin  30 mg Subcutaneous Q24H  . DISCONTD: oxyCODONE-acetaminophen  2 tablet Oral Once    HYDROmorphone (DILAUDID) injection, ondansetron (ZOFRAN) IV, senna-docusate     . sodium chloride 100 mL/hr at 01/31/12 0715    Assessment/Plan:  Acute left parietal ischemic stroke. :  Follow  MRI/MRA of the brain ,carotid Dopplers and 2-D echocardiogram. -Continue  Aspirin,start zocor .follow HBA1C -PT/OT/SLP consult  Hypertension : BP runing low,continue to hold antihypertensives .patient will be treated accordingly with antihypertensive Seizure disorder: order Keppra,increase dose to 1000 mg bid as per neuro.Follow EEG.   Hyperglycemia: check HBA1C ,CBG monitoring ,start sensitive SSI. Alcoholic cirrhosis:  stable Polysubstance abuse: as per patient he quit a year ago. DVT prophylaxis; [place] Lovenx    LOS: 1 day   Colin Norment 01/31/2012, 7:47 AM

## 2012-01-31 NOTE — Progress Notes (Signed)
Portable EEG completed

## 2012-01-31 NOTE — Procedures (Signed)
EEG NUMBER:  REFERRING PHYSICIAN:  Barbette Merino, MD  HISTORY:  A 56 year old male with new-onset seizures.  MEDICATIONS:  Aspirin, Keppra, NovoLog, oxycodone, Lovenox, Zocor.  CONDITIONS OF RECORDING:  This is a 16 channel EEG carried out with the patient in the awake and drowsy states.  DESCRIPTION:  The waking background activity consists of a low-voltage, symmetrical, fairly well-organized 7-8 Hz beta activity seen from the parieto-occipital and posterotemporal regions.  Low-voltage, fast activity, poorly organized is seen anteriorly, but rarely.  Slower rhythms are more predominant anteriorly.  There is a mixture of beta and alpha rhythms seen from the central and temporal regions.  The patient drowses with slowing to low voltage theta and beta activity. Intermittently during the tracing, has noted sharp activity over the left hemisphere with phase reversal at [facility].  At times, this left-sided sharp activity does generalize and involved both sides with the frontal regions being most involved.  Hypoventilation was not performed. Intermittent photic stimulation failed to list any change in the tracing.  IMPRESSION:  This is an abnormal EEG secondary to left-sided sharp activity.  This finding is consistent with a focal disturbance with epileptogenic potential.  Differential includes stroke among other possibilities.          ______________________________ Alexis Goodell, MD    [id number] D:  01/31/2012 17:43:26  T:  01/31/2012 21:15:29  Job #:  [id number]

## 2012-01-31 NOTE — Progress Notes (Signed)
Pt arrived to floor from ED via stretcher with family at bedside. Vital signs stable. IV infusing with NS @100  in left AC. Neuro check and vitals q2h for 12 hours. No skin issues. Right sided weakness present. Oriented to room and call bell. Explained reason for bed alarm. Urine sample and MRSA pcr collected and sent to lab. All pt and family questions answered. Will continue to monitor.

## 2012-01-31 NOTE — Progress Notes (Signed)
  Echocardiogram 2D Echocardiogram has been performed.  Adelard, Fridman 01/31/2012, 3:04 PM

## 2012-01-31 NOTE — Evaluation (Signed)
Occupational Therapy Evaluation Patient Details Name: Steven Frey MRN: [id number] DOB: [birth year] Today's Date: 01/31/2012 Time: [id number] OT Time Calculation (min): 16 min  OT Assessment / Plan / Recommendation Clinical Impression  Pt presents to OT with decreased I with ADL activity s/p hospital admit for CVA. Pt feels he is almost back to baseline with ADL activity. Will benefit from skilled OT in the acute setting to increase I wtih ADL activity and return to [facility]    OT Assessment  Patient needs continued OT Services    Follow Up Recommendations  Outpatient OT;Other (comment) (could benefit from OPOT for possible splinting )    Equipment Recommendations  None recommended by OT    Frequency Min 2X/week    Precautions / Restrictions Precautions Precautions: Fall       ADL  Eating/Feeding: Performed;Minimal assistance;Other (comment) (needed assist opening packages) Where Assessed - Eating/Feeding: Chair;Other (comment) (with left hand) Upper Body Bathing: Simulated;Minimal assistance Where Assessed - Upper Body Bathing: Unsupported;Sitting, bed Lower Body Bathing: Minimal assistance Where Assessed - Lower Body Bathing: Sit to stand from bed Upper Body Dressing: Simulated;Minimal assistance Where Assessed - Upper Body Dressing: Sitting, bed;Unsupported Lower Body Dressing: Performed;Minimal assistance Where Assessed - Lower Body Dressing: Sit to stand from bed Toilet Transfer: Simulated;Supervision/safety Toilet Transfer Method: Stand pivot Toilet Transfer Equipment: Comfort height toilet Toileting - Clothing Manipulation: Simulated;Minimal assistance Where Assessed - Camera operator Manipulation: Standing Toileting - Hygiene: Simulated;Minimal assistance Where Assessed - Toileting Hygiene: Standing Tub/Shower Transfer: Not assessed    OT Goals Acute Rehab OT Goals OT Goal Formulation: With patient ADL Goals Pt Will Perform Upper Body Bathing: with modified  independence;Unsupported;Sitting, edge of bed ADL Goal: Upper Body Bathing - Progress: Goal set today Pt Will Perform Lower Body Bathing: Sit to stand from bed;with modified independence ADL Goal: Lower Body Bathing - Progress: Goal set today Pt Will Perform Upper Body Dressing: with modified independence;Sitting, bed;Unsupported ADL Goal: Upper Body Dressing - Progress: Goal set today Pt Will Perform Lower Body Dressing: Sit to stand from bed ADL Goal: Lower Body Dressing - Progress: Goal set today Pt Will Transfer to Toilet: Comfort height toilet;with modified independence ADL Goal: Toilet Transfer - Progress: Goal set today Arm Goals Pt Will Perform AROM: to decrease contractures;to maintain range of motion;Right upper extremity;10 reps Arm Goal: AROM - Progress: Goal set today  Visit Information  Assistance Needed: +1    Subjective Data  Subjective: My dad helps me out, but I was able to do for myself   Prior Functioning  Home Living Lives With:  (father) Available Help at Discharge: Family Type of Home: Mobile home Home Access: Stairs to enter Technical brewer of Steps: 3 Entrance Stairs-Rails: Right;Left;Can reach both Home Layout: One level Bathroom Shower/Tub: Chiropodist: Standard Bathroom Accessibility: No Home Adaptive Equipment: Straight cane Additional Comments: Uses cane intermittently more for outdoors Prior Function Level of Independence: Needs assistance Needs Assistance: Meal Prep;Light Housekeeping Meal Prep: Minimal Light Housekeeping: Minimal Able to Take Stairs?: Yes Driving: No Vocation: On disability Comments: Patient states that if Dad was not willing to help he could do all activties in the home, but his Dad currently will take over the task. Communication Communication: Expressive difficulties Dominant Hand: Right (pt uses L to feed himself )    Cognition  Overall Cognitive Status: Appears within functional limits  for tasks assessed/performed Arousal/Alertness: Awake/alert Orientation Level: Appears intact for tasks assessed Behavior During Session: [facility] for tasks performed    Extremity/Trunk Assessment  Right Upper Extremity Assessment RUE ROM/Strength/Tone: Deficits RUE ROM/Strength/Tone Deficits: deficits due to CVA a year ago.  Pts R hand with flexed digits, but pt able to open hand with increased effort.  Pt able to use L hand to open R hand as well. RUE Coordination: Deficits RUE Coordination Deficits: Able to use R UE as a gross assist Left Upper Extremity Assessment LUE ROM/Strength/Tone: Within functional levels Right Lower Extremity Assessment RLE ROM/Strength/Tone: Deficits RLE ROM/Strength/Tone Deficits: Knww flexion and extension 4/5. Hip in all planes 4/5 RLE Sensation: Deficits RLE Sensation Deficits: Decreased light touch right lower extremity RLE Coordination: Deficits RLE Coordination Deficits: Decreased motor control Left Lower Extremity Assessment LLE ROM/Strength/Tone: Within functional levels LLE Sensation: WFL - Proprioception;WFL - Light Touch Trunk Assessment Trunk Assessment: Normal   Mobility Bed Mobility Bed Mobility: Rolling Right;Right Sidelying to Sit;Sit to Supine;Sitting - Scoot to Edge of Bed Rolling Right: 6: Modified independent (Device/Increase time) Right Sidelying to Sit: 6: Modified independent (Device/Increase time) Sitting - Scoot to Edge of Bed: 6: Modified independent (Device/Increase time) Sit to Supine: 6: Modified independent (Device/Increase time) Transfers Transfers: Sit to Stand;Stand to Sit Sit to Stand: 6: Modified independent (Device/Increase time);Without upper extremity assist;With upper extremity assist;From bed Stand to Sit: Without upper extremity assist;With upper extremity assist;7: Independent;To bed      Balance Standardized Balance Assessment Standardized Balance Assessment: Berg Balance Test Berg Balance Test Sit to Stand: Able  to stand using hands after several tries Standing Unsupported: Able to stand safely 2 minutes Sitting with Back Unsupported but Feet Supported on Floor or Stool: Able to sit safely and securely 2 minutes Stand to Sit: Sits safely with minimal use of hands Transfers: Able to transfer safely, minor use of hands Standing Unsupported with Eyes Closed: Able to stand 10 seconds safely Standing Ubsupported with Feet Together: Able to place feet together independently and stand for 1 minute with supervision From Standing, Reach Forward with Outstretched Arm: Reaches forward but needs supervision From Standing Position, Pick up Object from Floor: Able to pick up shoe, needs supervision From Standing Position, Turn to Look Behind Over each Shoulder: Looks behind from both sides and weight shifts well Turn 360 Degrees: Needs close supervision or verbal cueing Standing Unsupported, Alternately Place Feet on Step/Stool: Able to complete >2 steps/needs minimal assist Standing Unsupported, One Foot in Front: Loses balance while stepping or standing Standing on One Leg: Tries to lift leg/unable to hold 3 seconds but remains standing independently Total Score: 36   End of Session OT - End of Session Activity Tolerance: Patient tolerated treatment well Patient left: in chair;with call bell/phone within reach Nurse Communication: Mobility status   Betsy Pries 01/31/2012, 12:55 PM

## 2012-02-01 DIAGNOSIS — I459 Conduction disorder, unspecified: Secondary | ICD-10-CM

## 2012-02-01 DIAGNOSIS — I6350 Cerebral infarction due to unspecified occlusion or stenosis of unspecified cerebral artery: Secondary | ICD-10-CM

## 2012-02-01 DIAGNOSIS — I441 Atrioventricular block, second degree: Secondary | ICD-10-CM

## 2012-02-01 LAB — CBC
HCT: 40.8 % (ref 39.0–52.0)
Hemoglobin: 12.7 g/dL — ABNORMAL LOW (ref 13.0–17.0)
MCH: 29.5 pg (ref 26.0–34.0)
MCHC: 31.1 g/dL (ref 30.0–36.0)
MCV: 94.9 fL (ref 78.0–100.0)
Platelets: 270 10*3/uL (ref 150–400)
RBC: 4.3 MIL/uL (ref 4.22–5.81)
RDW: 14.1 % (ref 11.5–15.5)
WBC: 7.3 10*3/uL (ref 4.0–10.5)

## 2012-02-01 LAB — COMPREHENSIVE METABOLIC PANEL
ALT: 22 U/L (ref 0–53)
AST: 31 U/L (ref 0–37)
Albumin: 3.1 g/dL — ABNORMAL LOW (ref 3.5–5.2)
Alkaline Phosphatase: 145 U/L — ABNORMAL HIGH (ref 39–117)
BUN: 6 mg/dL (ref 6–23)
CO2: 21 mEq/L (ref 19–32)
Calcium: 8.6 mg/dL (ref 8.4–10.5)
Chloride: 105 mEq/L (ref 96–112)
Creatinine, Ser: 0.83 mg/dL (ref 0.50–1.35)
GFR calc Af Amer: >90 mL/min (ref >90)
GFR calc non Af Amer: >90 mL/min (ref >90)
Glucose, Bld: 240 mg/dL — ABNORMAL HIGH (ref 70–99)
Potassium: 4 mEq/L (ref 3.5–5.1)
Sodium: 137 mEq/L (ref 135–145)
Total Bilirubin: 0.6 mg/dL (ref 0.3–1.2)
Total Protein: 6.6 g/dL (ref 6.0–8.3)

## 2012-02-01 LAB — GLUCOSE, CAPILLARY
Glucose-Capillary: 202 mg/dL — ABNORMAL HIGH (ref 70–99)
Glucose-Capillary: 225 mg/dL — ABNORMAL HIGH (ref 70–99)
Glucose-Capillary: 202 mg/dL — ABNORMAL HIGH (ref 70–99)
Glucose-Capillary: 125 mg/dL — ABNORMAL HIGH (ref 70–99)

## 2012-02-01 MED ORDER — LIVING WELL WITH DIABETES BOOK
Freq: Once | Status: AC
  Administered 2012-02-01: 09:00:00
  Filled 2012-02-01: qty 1

## 2012-02-01 MED ORDER — MUPIROCIN 2 % EX OINT
1.0000 "application " | TOPICAL_OINTMENT | Freq: Two times a day (BID) | CUTANEOUS | Status: DC
  Administered 2012-02-01 – 2012-02-02 (×3): 1 via NASAL
  Filled 2012-02-01: qty 22

## 2012-02-01 MED ORDER — INSULIN GLARGINE 100 UNIT/ML ~~LOC~~ SOLN
15.0000 [IU] | Freq: Every day | SUBCUTANEOUS | Status: DC
  Administered 2012-02-01: 15 [IU] via SUBCUTANEOUS

## 2012-02-01 MED ORDER — LEVETIRACETAM 500 MG PO TABS
1000.0000 mg | ORAL_TABLET | Freq: Two times a day (BID) | ORAL | Status: DC
  Administered 2012-02-01 – 2012-02-02 (×3): 1000 mg via ORAL
  Filled 2012-02-01 (×3): qty 2

## 2012-02-01 MED ORDER — CHLORHEXIDINE GLUCONATE CLOTH 2 % EX PADS
6.0000 | MEDICATED_PAD | Freq: Every day | CUTANEOUS | Status: DC
  Administered 2012-02-01 – 2012-02-02 (×2): 6 via TOPICAL

## 2012-02-01 MED ORDER — METFORMIN HCL 500 MG PO TABS
500.0000 mg | ORAL_TABLET | Freq: Two times a day (BID) | ORAL | Status: DC
  Administered 2012-02-01: 500 mg via ORAL
  Filled 2012-02-01 (×4): qty 1

## 2012-02-01 NOTE — Consult Note (Addendum)
ELECTROPHYSIOLOGY CONSULT NOTE    Patient ID: Steven Frey MRN: [id number], DOB/AGE: [birth year] 56 y.o.  Admit date: 01/30/2012 Date of Consult: 02-01-2012  Primary Physician: Karoline Caldwell Primary Cardiologist: Thompson Grayer, MD  Reason for Consultation: heart block on telemetry  HPI:  Steven Frey is a 56 year old male with a history of chronic pancreatitis in the setting of alcohol abuse, cirrhosis, possible hepatocellular carcinoma, seizure disorder, prior stroke, depression. He has undergone pancreatic ductal stenting at [facility]. He had an out of hospital VF arrest in 6/11 in the setting of heavy ETOH use and electrolyte depletion. He was resuscitated and placed on the [facility] Protocol. He had QT prolongation during cooling that eventually resolved. He had a prolonged hospitalization with an anoxic brain injury. Cardiac catheterization 6/11: Normal coronary arteries, minimal inferobasal wall hypokinesis, EF 55%. Echocardiogram 7/11: EF 99991111, grade 2 diastolic dysfunction, PASP 26. He had previously worn a LifeVest, but refused to keep wearing it. He was previously felt to be a poor ICD candidate, and the patient did not wish to pursue this. He was last seen by Dr. Rayann Heman 3/12.  On the day of admission, the patient was at home with his father and his daughter when he had a sudden loss of consciousness.  EMS was called and he was brought to [facility] for further evaluation.  On arrival, it was felt that the patient had an acute left parietal ischemic stroke, however MRI demonstrated no acute stroke and so this episode was attributed to his seizure disorder.  Of note, his [facility] physician had stopped his Keppra about 5 months ago.    Plans were for discharge soon, however, on telemetry last night, he had an episode of heart block.  The patient was sleeping at the time.  On the strip from 6:58AM, there is P-P and R-R lengthening consistent with AV wenkebach block.  His Coreg has been held for  hypotension this admission. EP has been asked to evaluate.  The patient denies chest pain, shortness of breath, or palpitations.  He states that he has not had any syncope or pre-syncope.  He has never been evaluated for sleep apnea.    Past Medical History  Diagnosis Date  . Angioedema     rx requiring intubation/vent support suspected secondary to ACE1 (but also on zithromax) 5/09 hospitalization   . Smoker   . ETOH abuse   . Chronic pancreatitis     (10/08 [facility]). admx 02/2010 pseudocyst aspirated during admxn. pancreatic dual stent placement at [facility] 11/11  . Cirrhosis     due to ETOH with recent hepatic abcess and possible HCC.   . Gastritis     alcohol induced  . Depression     no hx of meds  . Elevated BP     boarderline elevation in BP in past  . VF (ventricular fibrillation)     arrest 6/11. successfully rescucitated w/prolinged hospitalization due to respitatory failure. QT prolongation during cooling, now resolved  . History of stroke   . 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 Anxiety   . Seizures   . Edema   . Abscess of liver   . Anoxic brain damage   . Anemia   . Other convulsions   . Hypertension      Surgical History:  Past Surgical History  Procedure Date  . Inguinal hernia repair     right  . Right wrist orif     for fx  .  Pancreatic stent placement      Prescriptions prior to admission  Medication Sig Dispense Refill  . carvedilol (COREG) 3.125 MG tablet Take 3.125 mg by mouth 2 (two) times daily with a meal.      . cyclobenzaprine (FLEXERIL) 10 MG tablet Take 10 mg by mouth 3 (three) times daily.      Marland Kitchen esomeprazole (NEXIUM) 40 MG capsule Take 40 mg by mouth daily.       . folic acid (FOLVITE) 1 MG tablet Take 1 mg by mouth daily.        . iron polysaccharides (NIFEREX) 150 MG capsule Take 150 mg by mouth 2 (two) times daily.        Marland Kitchen levETIRAcetam (KEPPRA) 500 MG tablet Take 500-1,000 mg by mouth 2 (two)  times daily. Take 500 MG in the morning, and take 1000 MG at bedtime.      . lipase/protease/amylase (CREON) 12000 UNITS CPEP Take 1 capsule by mouth daily.        Marland Kitchen LORazepam (ATIVAN) 1 MG tablet Take 1 mg by mouth 2 (two) times daily.      . Multiple Vitamin (MULITIVITAMIN WITH MINERALS) TABS Take 1 tablet by mouth daily.      . nitroGLYCERIN (NITROSTAT) 0.4 MG SL tablet Place 0.4 mg under the tongue every 5 (five) minutes as needed. For chest pain      . ondansetron (ZOFRAN) 4 MG tablet Take 4 mg by mouth every 8 (eight) hours as needed. For nausea      . oxyCODONE (OXYCONTIN) 10 MG 12 hr tablet Take 10 mg by mouth daily.      . sucralfate (CARAFATE) 1 G tablet Take 1 g by mouth 3 (three) times daily.        . Thiamine HCl (VITAMIN B-1) 100 MG tablet Take 100 mg by mouth daily.        . vitamin B-12 (CYANOCOBALAMIN) 1000 MCG tablet Take 1,000 mcg by mouth daily.          Inpatient Medications:    . aspirin  325 mg Oral Daily  . Chlorhexidine Gluconate Cloth  6 each Topical Q0600  . enoxaparin (LOVENOX) injection  30 mg Subcutaneous Q24H  . insulin aspart  0-9 Units Subcutaneous TID WC  . insulin glargine  15 Units Subcutaneous QHS  . levETIRAcetam  1,000 mg Oral BID  . metFORMIN  500 mg Oral BID WC  . mupirocin ointment  1 application Nasal BID  . oxyCODONE  10 mg Oral Daily  . simvastatin  10 mg Oral q1800    Allergies:  Allergies  Allergen Reactions  . Azithromycin Swelling    Throat swelling, body swelling  . Lisinopril Other (See Comments)    REACTION: facial/neck edema    History   Social History  . Marital Status: Legally Separated    Spouse Name: N/A    Number of Children: N/A  . Years of Education: N/A   Occupational History  . Not on file.   Social History Main Topics  . Smoking status: Former Smoker -- 1.0 packs/day for 20 years    Quit date: 05/26/2010  . Smokeless tobacco: Former Systems developer    Quit date: 05/26/2010  . Alcohol Use: No     hx of alcohol  abuse, last used prior to hospitalization for VF arrest 6/11. denies further ETOH since that time. has never been in [place]  . Drug Use: No  . Sexually Active: Not on file   Other Topics  Concern  . Not on file   Social History Narrative   Divorced, 2 children. 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 Coronary artery disease Neg Hx   . Colon cancer Neg Hx       Labs:   Lab Results  Component Value Date   WBC 7.3 02/01/2012   HGB 12.7* 02/01/2012   HCT 40.8 02/01/2012   MCV 94.9 02/01/2012   PLT 270 02/01/2012    Lab 02/01/12 0939  NA 137  K 4.0  CL 105  CO2 21  BUN 6  CREATININE 0.83  CALCIUM 8.6  PROT 6.6  BILITOT 0.6  ALKPHOS 145*  ALT 22  AST 31  GLUCOSE 240*   Lab Results  Component Value Date   CKTOTAL 162 01/30/2012   CKMB 2.3 01/30/2012   TROPONINI <0.30 01/30/2012   Lab Results  Component Value Date   CHOL 168 01/31/2012   CHOL  Value: 75        ATP III CLASSIFICATION:  <200     mg/dL   Desirable  200-239  mg/dL   Borderline High  >=240    mg/dL   High        07/30/2010   CHOL  Value: 76        ATP III CLASSIFICATION:  <200     mg/dL   Desirable  200-239  mg/dL   Borderline High  >=240    mg/dL   High        03/26/2010   Lab Results  Component Value Date   HDL 40 01/31/2012   HDL 11* 07/30/2010   HDL 35* 03/26/2010   Lab Results  Component Value Date   LDLCALC 104* 01/31/2012   LDLCALC  Value: 52        Total Cholesterol/HDL:CHD Risk Coronary Heart Disease Risk Table                     Men   Women  1/2 Average Risk   3.4   3.3  Average Risk       5.0   4.4  2 X Average Risk   9.6   7.1  3 X Average Risk  23.4   11.0        Use the calculated Patient Ratio above and the CHD Risk Table to determine the patient's CHD Risk.        ATP III CLASSIFICATION (LDL):  <100     mg/dL   Optimal  100-129  mg/dL   Near or Above                    Optimal  130-159  mg/dL   Borderline  160-189  mg/dL   High  >190     mg/dL   Very High  07/30/2010   LDLCALC  Value: 31        Total Cholesterol/HDL:CHD Risk Coronary Heart Disease Risk Table                     Men   Women  1/2 Average Risk   3.4   3.3  Average Risk       5.0   4.4  2 X Average Risk   9.6   7.1  3 X Average Risk  23.4   11.0        Use the calculated Patient Ratio above and  the CHD Risk Table to determine the patient's CHD Risk.        ATP III CLASSIFICATION (LDL):  <100     mg/dL   Optimal  100-129  mg/dL   Near or Above                    Optimal  130-159  mg/dL   Borderline  160-189  mg/dL   High  >190     mg/dL   Very High 03/26/2010   Lab Results  Component Value Date   TRIG 122 01/31/2012   TRIG 61 07/30/2010   TRIG 48 03/26/2010   Lab Results  Component Value Date   CHOLHDL 4.2 01/31/2012   CHOLHDL 6.8 07/30/2010   CHOLHDL 2.2 03/26/2010    Radiology/Studies:  Mr Brain Wo Contrast 01/31/2012  *RADIOLOGY REPORT*  Clinical Data:  Right-sided weakness with aphasia.  Hypertension. Cirrhosis.  Suspected acute but ill-defined cerebrovascular disease.  MRI HEAD WITHOUT CONTRAST MRA HEAD WITHOUT CONTRAST  Technique:  Multiplanar, multiecho pulse sequences of the brain and surrounding structures were obtained without intravenous contrast. Angiographic images of the head were obtained using MRA technique without contrast.  Comparison:  CT head 01/30/2012.  Also CT head 09/01/2010.  Prior MR head 03/29/2010.  MRI HEAD  Findings:  No acute stroke, intracranial hemorrhage, mass lesion, hydrocephalus, or extra-axial fluid.  There is an old left parietal cortical and subcortical infarction without visible associated hemorrhage.  There is gliosis and encephalomalacia.  There is no midline shift.  Premature atrophy is present.  There is chronic microvascular ischemic change throughout the periventricular and subcortical white matter.  The major intracranial vascular structures appear patent.  Extracranial soft tissues grossly unremarkable.  Pituitary and cerebellar tonsils are  unremarkable.  IMPRESSION: Remote left parietal infarction.  Atrophy and small vessel disease.  MRA HEAD  Findings: Right internal carotid artery demonstrates slight atheromatous outpouching inferior cavernous segment.  No focal stenosis in this location or elsewhere.  Left internal carotid artery normal throughout its skull base, and cavernous segments.  Slight nonstenotic narrowing supraclinoid ICA.  Basilar artery slightly hypoplastic, perhaps secondary to fetal right PCA origin. No focal stenosis.  Vertebrals codominant and widely patent.  No focal narrowing of the anterior, middle, or posterior cerebral arteries. No visible cerebellar branch occlusion.  Slight patient motion obscures detail of the distal MCA and PCA branches.  IMPRESSION: No proximal flow reducing lesion is observed.  See discussion above.  Original Report Authenticated By: Staci Righter, M.D.   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 AV wenkebach block 2. 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 Remote VF arrest in the setting of metabolic and electrolyte abnormalities 4. Remote stroke Rec: his bradycardia is nocturnal and is asymptomatic. No indication for device implantation at this time. With multiple comorbidities and preserved LV function, he is not a candidate for ICD implant, despite his prior VF arrest.   Cristopher Peru, M.D.

## 2012-02-01 NOTE — Progress Notes (Signed)
Physical Therapy Treatment and Discharge Summary Patient Details Name: Steven Frey MRN: [id number] DOB: Jun 11, 1956 Today's Date: 02/01/2012 Time: [id number] PT Time Calculation (min): 27 min  PT Assessment / Plan / Recommendation Comments on Treatment Session  Patient has made excellent improvement in physical therapy and does not require further follow up. Patient is functioning at baseline.    Follow Up Recommendations  No PT follow up    Equipment Recommendations  None recommended by PT    Frequency   N/A  Plan Discharge plan needs to be updated;Frequency needs to be updated;All goals met and education completed, patient dischaged from PT services    Precautions / Restrictions Precautions Precautions: Fall Precaution Comments: Secondary to premorbid deficits from old CVA   Pertinent Vitals/Pain Chronic right hand pain - heat applied and pre-medicated    Mobility  Bed Mobility Rolling Right: 7: Independent Right Sidelying to Sit: 7: Independent Sitting - Scoot to Edge of Bed: 7: Independent Sit to Supine: 7: Independent Transfers Sit to Stand: 7: Independent;From chair/3-in-1;From bed;With upper extremity assist;Without upper extremity assist Stand to Sit: 7: Independent;To chair/3-in-1;To bed;With upper extremity assist;Without upper extremity assist Ambulation/Gait Ambulation/Gait Assistance: 6: Modified independent (Device/Increase time) Ambulation Distance (Feet): 300 Feet Assistive device: Straight cane Ambulation/Gait Assistance Details: No giving way of knee. Improved speed and efficiency and did not tend to use cane just carry it.  Gait Pattern: Within Functional Limits Gait velocity: 2.3 feet/second - inidicative of safe ambulation in neighboorhood with decreased risk of recurrent falls (this is patients baseline abilities) Stairs: No (Patient does not feel it is necessary) Modified Rankin (Stroke Patients Only) Modified Rankin: Slight disability    Exercises  Other Exercises Other Exercises: Educated in tandem and single limb stance exercises for home exercise program to improve stability and stance of right leg. Patient to reach targets of 30 and 10 seconds respectively.    PT Goals Acute Rehab PT Goals PT Goal: Ambulate - Progress: Met PT Goal: Up/Down Stairs - Progress: Discontinued (comment) (patient does not feel necessary) PT Goal: Perform Home Exercise Program - Progress: Met Additional Goals PT Goal: Additional Goal #1 - Progress: Met  Visit Information  Last PT Received On: 02/01/12    Subjective Data  Subjective: Patient reprots feeling back to baseline Patient Stated Goal: Splints for right hand to assist with alleviating pain in hand   Cognition  Overall Cognitive Status: Appears within functional limits for tasks assessed/performed Arousal/Alertness: Awake/alert Orientation Level: Appears intact for tasks assessed Behavior During Session: [facility] for tasks performed    Balance  Berg Balance Test Sit to Stand: Able to stand without using hands and stabilize independently Standing Unsupported: Able to stand safely 2 minutes Sitting with Back Unsupported but Feet Supported on Floor or Stool: Able to sit safely and securely 2 minutes Stand to Sit: Sits safely with minimal use of hands Transfers: Able to transfer safely, minor use of hands Standing Unsupported with Eyes Closed: Able to stand 10 seconds safely Standing Ubsupported with Feet Together: Able to place feet together independently and stand 1 minute safely From Standing, Reach Forward with Outstretched Arm: Can reach forward >12 cm safely (5") From Standing Position, Pick up Object from Floor: Able to pick up shoe safely and easily From Standing Position, Turn to Look Behind Over each Shoulder: Looks behind from both sides and weight shifts well Turn 360 Degrees: Able to turn 360 degrees safely in 4 seconds or less Standing Unsupported, Alternately Place Feet on  Step/Stool: Able to  complete 4 steps without aid or supervision Standing Unsupported, One Foot in Front: Able to plae foot ahead of the other independently and hold 30 seconds Standing on One Leg: Able to lift leg independently and hold 5-10 seconds Total Score: 51  High Level Balance High Level Balance Activites: Side stepping;Turns;Sudden stops;Backward walking  End of Session PT - End of Session Equipment Utilized During Treatment: Gait belt Activity Tolerance: Patient tolerated treatment well Patient left: in chair;with call bell/phone within reach Nurse Communication: Mobility status    Jake Bathe, PT  Pager [phone]  02/01/2012, 10:02 AM

## 2012-02-01 NOTE — Progress Notes (Signed)
Subjective: No complains. Ran out of meds.  Objective: Filed Vitals:   02/01/12 0000 02/01/12 0400 02/01/12 0644 02/01/12 0800  BP: 110/70 108/75  110/71  Pulse: 87 81  97  Temp: 97.6 F (36.4 C) 96.9 F (36.1 C) 97.3 F (36.3 C) 97.5 F (36.4 C)  TempSrc: Oral Oral    Resp: 18 18  17   Height:      Weight:      SpO2: 96% 94%  100%   Weight change:   Intake/Output Summary (Last 24 hours) at 02/01/12 1119 Last data filed at 02/01/12 0700  Gross per 24 hour  Intake    720 ml  Output   1375 ml  Net   -655 ml    General: Alert, awake, oriented x3, in no acute distress.  HEENT: No bruits, no goiter.  Heart: Regular rate and rhythm, without murmurs, rubs, gallops.  Lungs: Crackles left side, bilateral air movement.  Abdomen: Soft, nontender, nondistended, positive bowel sounds.  Neuro: Grossly intact, nonfocal.   Lab Results:  [facility] 02/01/12 0939 01/30/12 2113 01/30/12 2054  NA 137 138 135  K 4.0 4.2 4.0  CL 105 106 101  CO2 21 -- 18*  GLUCOSE 240* 291* 272*  BUN 6 7 8   CREATININE 0.83 0.90 0.96  CALCIUM 8.6 -- 9.4  MG -- -- --  PHOS -- -- --    Basename 02/01/12 0939 01/30/12 2054  AST 31 22  ALT 22 24  ALKPHOS 145* 151*  BILITOT 0.6 0.5  PROT 6.6 7.3  ALBUMIN 3.1* 3.6   No results found for this basename: LIPASE:2,AMYLASE:2 in the last 72 hours  Basename 02/01/12 0939 01/30/12 2113 01/30/12 2054  WBC 7.3 -- 12.3*  NEUTROABS -- -- 8.8*  HGB 12.7* 15.3 --  HCT 40.8 45.0 --  MCV 94.9 -- 92.6  PLT 270 -- 249    Basename 01/30/12 2055  CKTOTAL 162  CKMB 2.3  CKMBINDEX --  TROPONINI <0.30   No components found with this basename: POCBNP:3 No results found for this basename: DDIMER:2 in the last 72 hours  Basename 01/31/12 0500  HGBA1C 10.1*    Basename 01/31/12 0710  CHOL 168  HDL 40  LDLCALC 104*  TRIG 122  CHOLHDL 4.2  LDLDIRECT --   No results found for this basename: TSH,T4TOTAL,FREET3,T3FREE,THYROIDAB in the last 72 hours No  results found for this basename: VITAMINB12:2,FOLATE:2,FERRITIN:2,TIBC:2,IRON:2,RETICCTPCT:2 in the last 72 hours  Micro Results: Recent Results (from the past 240 hour(s))  MRSA PCR SCREENING     Status: Abnormal   Collection Time   01/31/12 12:25 AM      Component Value Range Status Comment   MRSA by PCR POSITIVE (*) NEGATIVE  Final     Studies/Results: Ct Head Wo Contrast  01/30/2012  *RADIOLOGY REPORT*  Clinical Data: Code stroke, aphasia  CT HEAD WITHOUT CONTRAST  Technique:  Contiguous axial images were obtained from the base of the skull through the vertex without contrast.  Comparison: 09/01/2010  Findings: Examination is degraded by patient motion.  Left occipital encephalomalacia again noted.  There is focal loss of gray-white differentiation and hypodensity of the gyri in the high left parietal lobe, new since the prior study.  Mild asymmetric prominence of the left lateral ventricle as compared to the right is stable.  Diffuse cortical volume loss noted with proportional ventricular prominence.  No acute hemorrhage or mass lesion identified.  No midline shift.  No acute osseous finding.  Orbits and paranasal sinuses are  grossly intact incompletely imaged due to patient obliquity in the scanner.  IMPRESSION: High left parietal cortical hypodensity most compatible with acute infarct given the gyri involved and speech difficulties. Mass lesion is felt less likely.  Critical Value/emergent results were called by telephone at the time of interpretation on 01/30/2012  at 8:50 p.m.  to  Dr. Wyvonnia Dusky, who verbally acknowledged these results.  Original Report Authenticated By: Arline Asp, M.D.   Mr Brain Wo Contrast  01/31/2012  *RADIOLOGY REPORT*  Clinical Data:  Right-sided weakness with aphasia.  Hypertension. Cirrhosis.  Suspected acute but ill-defined cerebrovascular disease.  MRI HEAD WITHOUT CONTRAST MRA HEAD WITHOUT CONTRAST  Technique:  Multiplanar, multiecho pulse sequences of the  brain and surrounding structures were obtained without intravenous contrast. Angiographic images of the head were obtained using MRA technique without contrast.  Comparison:  CT head 01/30/2012.  Also CT head 09/01/2010.  Prior MR head 03/29/2010.  MRI HEAD  Findings:  No acute stroke, intracranial hemorrhage, mass lesion, hydrocephalus, or extra-axial fluid.  There is an old left parietal cortical and subcortical infarction without visible associated hemorrhage.  There is gliosis and encephalomalacia.  There is no midline shift.  Premature atrophy is present.  There is chronic microvascular ischemic change throughout the periventricular and subcortical white matter.  The major intracranial vascular structures appear patent.  Extracranial soft tissues grossly unremarkable.  Pituitary and cerebellar tonsils are unremarkable.  IMPRESSION: Remote left parietal infarction.  Atrophy and small vessel disease.  MRA HEAD  Findings: Right internal carotid artery demonstrates slight atheromatous outpouching inferior cavernous segment.  No focal stenosis in this location or elsewhere.  Left internal carotid artery normal throughout its skull base, and cavernous segments.  Slight nonstenotic narrowing supraclinoid ICA.  Basilar artery slightly hypoplastic, perhaps secondary to fetal right PCA origin. No focal stenosis.  Vertebrals codominant and widely patent.  No focal narrowing of the anterior, middle, or posterior cerebral arteries. No visible cerebellar branch occlusion.  Slight patient motion obscures detail of the distal MCA and PCA branches.  IMPRESSION: No proximal flow reducing lesion is observed.  See discussion above.  Original Report Authenticated By: Staci Righter, M.D.   Mr Mra Head/brain Wo Cm  01/31/2012  *RADIOLOGY REPORT*  Clinical Data:  Right-sided weakness with aphasia.  Hypertension. Cirrhosis.  Suspected acute but ill-defined cerebrovascular disease.  MRI HEAD WITHOUT CONTRAST MRA HEAD WITHOUT  CONTRAST  Technique:  Multiplanar, multiecho pulse sequences of the brain and surrounding structures were obtained without intravenous contrast. Angiographic images of the head were obtained using MRA technique without contrast.  Comparison:  CT head 01/30/2012.  Also CT head 09/01/2010.  Prior MR head 03/29/2010.  MRI HEAD  Findings:  No acute stroke, intracranial hemorrhage, mass lesion, hydrocephalus, or extra-axial fluid.  There is an old left parietal cortical and subcortical infarction without visible associated hemorrhage.  There is gliosis and encephalomalacia.  There is no midline shift.  Premature atrophy is present.  There is chronic microvascular ischemic change throughout the periventricular and subcortical white matter.  The major intracranial vascular structures appear patent.  Extracranial soft tissues grossly unremarkable.  Pituitary and cerebellar tonsils are unremarkable.  IMPRESSION: Remote left parietal infarction.  Atrophy and small vessel disease.  MRA HEAD  Findings: Right internal carotid artery demonstrates slight atheromatous outpouching inferior cavernous segment.  No focal stenosis in this location or elsewhere.  Left internal carotid artery normal throughout its skull base, and cavernous segments.  Slight nonstenotic narrowing supraclinoid ICA.  Basilar  artery slightly hypoplastic, perhaps secondary to fetal right PCA origin. No focal stenosis.  Vertebrals codominant and widely patent.  No focal narrowing of the anterior, middle, or posterior cerebral arteries. No visible cerebellar branch occlusion.  Slight patient motion obscures detail of the distal MCA and PCA branches.  IMPRESSION: No proximal flow reducing lesion is observed.  See discussion above.  Original Report Authenticated By: Staci Righter, M.D.    Medications: I have reviewed the patient's current medications.  Assessment and plan: Principal Problem: SEIZURE DISORDER: -Non complaince. Continue keppra. patient ran  out of meds. -[facility] for [facility] dollars. -MRI negative  HYPERTENSION, BENIGN -Controlled. Continue to hold Bp meds.   CIRRHOSIS, ALCOHOLIC/polysubstance abuse. -Stable.  History of CVA (cerebrovascular accident) -MRI negative, stroke team sign off.  Dysrhythmia: Overnight on telemetry had 2nd degre AV block looks like mobitz II. Cards consult.    LOS: 2 days   Charlynne Cousins M.D. Pager: [phone] [facility] 02/01/2012, 11:19 AM

## 2012-02-01 NOTE — Progress Notes (Signed)
   CARE MANAGEMENT NOTE 02/01/2012  Patient:  Steven Frey, Steven Frey   Account Number:  [id number]  Date Initiated:  02/01/2012  Documentation initiated by:  Felix Ahmadi Assessment:   56 yr-old male adm with seizure; lives with daughter and father, independent PTA.         DC Planning Services  CM consult  [facility]      Comments:  Primary Care @ [facility]  02/01/12 [place] MSN CCM Arranged hospital f/u appointment @ [facility] with Dr. Aldona Bar on Friday, May 31st @ 2:00 p.m.  Pt gets prescriptions filled @ [facility] and, per pharmacist, they have generic Keppra in stock.

## 2012-02-01 NOTE — Progress Notes (Signed)
[facility] PROGRESS NOTE    SUBJECTIVE   No complaints. Currently having non rhytmic right arm twitching which he states he has on a daily basis since his previous stroke.  He is alert and oriented and able to converse well with me.   OBJECTIVE   Vital signs in last 24 hours: Temp:  [96.9 F (36.1 C)-97.6 F (36.4 C)] 97.5 F (36.4 C) (04/24 0800) Pulse Rate:  [81-97] 97  (04/24 0800) Resp:  [16-18] 17  (04/24 0800) BP: (99-129)/(70-88) 110/71 mmHg (04/24 0800) SpO2:  [94 %-100 %] 100 % (04/24 0800)  Intake/Output from previous day: 04/23 0701 - 04/24 0700 In: 720 [P.O.:720] Out: 1375 [Urine:1375] Intake/Output this shift:   Nutritional status: Carb Control  Past Medical History  Diagnosis Date  . Angioedema     rx requiring intubation/vent support suspected secondary to ACE1 (but also on zithromax) 5/09 hospitalization   . Smoker   . ETOH abuse   . Chronic pancreatitis     (10/08 [place]). admx 02/2010 pseudocyst aspirated during admxn. pancreatic dual stent placement at [facility] 11/11  . Cirrhosis     due to ETOH with recent hepatic abcess and possible HCC.   . Gastritis     alcohol induced  . Depression     no hx of meds  . Elevated BP     boarderline elevation in BP in past  . VF (ventricular fibrillation)     arrest 6/11. successfully rescucitated w/prolinged hospitalization due to respitatory failure. QT prolongation during cooling, now resolved  . History of stroke   . 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 Anxiety   . Seizures   . Edema   . Abscess of liver   . Anoxic brain damage   . Anemia   . Other convulsions   . Hypertension     Neurologic Exam:  Mental Status: Alert, oriented, thought content appropriate.  Speech fluent without evidence of aphasia. Able to follow 3 step commands without difficulty. Cranial Nerves: II-Visual fields grossly  intact. III/IV/VI-Extraocular movements intact.  Pupils reactive bilaterally. V/VII-Smile symmetric VIII-grossly intact IX/X-normal gag XI-bilateral shoulder shrug XII-midline tongue extension Motor: right arm is held in flexion at elbow, right hand held in flexion with minimal grip.  He is able to lift right arm antigravity and shows 3/4 strength with right bicep flexion and triceps extension. Right arm shows non rhythmic jerking localized to right arm only.  All other extremities shows 5/5 strength.  Patient stated the myoclonus of right arm is his baseline and not abnormal for him.  Sensory: Pinprick and light touch intact throughout, bilaterally Deep Tendon Reflexes: 1+ and symmetric throughout Plantars: Downgoing left and up going right Cerebellar: Normal finger-to-nose,normal heel-to-shin test.   Lab Results: Lab Results  Component Value Date/Time   CHOL 168 01/31/2012  7:10 AM   Lipid Panel  Basename 01/31/12 0710  CHOL 168  TRIG 122  HDL 40  CHOLHDL 4.2  VLDL 24  LDLCALC 104*    Studies/Results: Ct Head Wo Contrast  01/30/2012  *RADIOLOGY REPORT*  Clinical Data: Code stroke, aphasia  CT HEAD WITHOUT CONTRAST  Technique:  Contiguous axial images were obtained from the base of the skull through the vertex without contrast.  Comparison: 09/01/2010  Findings: Examination is degraded by patient motion.  Left occipital encephalomalacia again noted.  There is focal loss of gray-white differentiation and hypodensity of the gyri in the high left parietal lobe, new since the prior study.  Mild asymmetric prominence of the left lateral ventricle as compared to the right is stable.  Diffuse cortical volume loss noted with proportional ventricular prominence.  No acute hemorrhage or mass lesion identified.  No midline shift.  No acute osseous finding.  Orbits and paranasal sinuses are grossly intact incompletely imaged due to patient obliquity in the scanner.  IMPRESSION: High left parietal  cortical hypodensity most compatible with acute infarct given the gyri involved and speech difficulties. Mass lesion is felt less likely.  Critical Value/emergent results were called by telephone at the time of interpretation on 01/30/2012  at 8:50 p.m.  to  Dr. Wyvonnia Dusky, who verbally acknowledged these results.  Original Report Authenticated By: Arline Asp, M.D.   Mr Brain Wo Contrast  01/31/2012  *RADIOLOGY REPORT*  Clinical Data:  Right-sided weakness with aphasia.  Hypertension. Cirrhosis.  Suspected acute but ill-defined cerebrovascular disease.  MRI HEAD WITHOUT CONTRAST MRA HEAD WITHOUT CONTRAST  Technique:  Multiplanar, multiecho pulse sequences of the brain and surrounding structures were obtained without intravenous contrast. Angiographic images of the head were obtained using MRA technique without contrast.  Comparison:  CT head 01/30/2012.  Also CT head 09/01/2010.  Prior MR head 03/29/2010.  MRI HEAD  Findings:  No acute stroke, intracranial hemorrhage, mass lesion, hydrocephalus, or extra-axial fluid.  There is an old left parietal cortical and subcortical infarction without visible associated hemorrhage.  There is gliosis and encephalomalacia.  There is no midline shift.  Premature atrophy is present.  There is chronic microvascular ischemic change throughout the periventricular and subcortical white matter.  The major intracranial vascular structures appear patent.  Extracranial soft tissues grossly unremarkable.  Pituitary and cerebellar tonsils are unremarkable.  IMPRESSION: Remote left parietal infarction.  Atrophy and small vessel disease.  MRA HEAD  Findings: Right internal carotid artery demonstrates slight atheromatous outpouching inferior cavernous segment.  No focal stenosis in this location or elsewhere.  Left internal carotid artery normal throughout its skull base, and cavernous segments.  Slight nonstenotic narrowing supraclinoid ICA.  Basilar artery slightly hypoplastic,  perhaps secondary to fetal right PCA origin. No focal stenosis.  Vertebrals codominant and widely patent.  No focal narrowing of the anterior, middle, or posterior cerebral arteries. No visible cerebellar branch occlusion.  Slight patient motion obscures detail of the distal MCA and PCA branches.  IMPRESSION: No proximal flow reducing lesion is observed.  See discussion above.  Original Report Authenticated By: Staci Righter, M.D.   Mr Mra Head/brain Wo Cm  01/31/2012  *RADIOLOGY REPORT*  Clinical Data:  Right-sided weakness with aphasia.  Hypertension. Cirrhosis.  Suspected acute but ill-defined cerebrovascular disease.  MRI HEAD WITHOUT CONTRAST MRA HEAD WITHOUT CONTRAST  Technique:  Multiplanar, multiecho pulse sequences of the brain and surrounding structures were obtained without intravenous contrast. Angiographic images of the head were obtained using MRA technique without contrast.  Comparison:  CT head 01/30/2012.  Also CT head 09/01/2010.  Prior MR head 03/29/2010.  MRI HEAD  Findings:  No acute stroke, intracranial hemorrhage, mass lesion, hydrocephalus, or extra-axial fluid.  There is an old left parietal cortical and subcortical infarction without visible associated hemorrhage.  There is gliosis and encephalomalacia.  There is no midline shift.  Premature atrophy is present.  There is chronic microvascular ischemic change  throughout the periventricular and subcortical white matter.  The major intracranial vascular structures appear patent.  Extracranial soft tissues grossly unremarkable.  Pituitary and cerebellar tonsils are unremarkable.  IMPRESSION: Remote left parietal infarction.  Atrophy and small vessel disease.  MRA HEAD  Findings: Right internal carotid artery demonstrates slight atheromatous outpouching inferior cavernous segment.  No focal stenosis in this location or elsewhere.  Left internal carotid artery normal throughout its skull base, and cavernous segments.  Slight nonstenotic  narrowing supraclinoid ICA.  Basilar artery slightly hypoplastic, perhaps secondary to fetal right PCA origin. No focal stenosis.  Vertebrals codominant and widely patent.  No focal narrowing of the anterior, middle, or posterior cerebral arteries. No visible cerebellar branch occlusion.  Slight patient motion obscures detail of the distal MCA and PCA branches.  IMPRESSION: No proximal flow reducing lesion is observed.  See discussion above.  Original Report Authenticated By: Staci Righter, M.D.    Medications:     Scheduled:   . aspirin  300 mg Rectal Daily   Or  . aspirin  325 mg Oral Daily  . Chlorhexidine Gluconate Cloth  6 each Topical Q0600  . enoxaparin (LOVENOX) injection  30 mg Subcutaneous Q24H  . insulin aspart  0-9 Units Subcutaneous TID WC  . levETIRAcetam  1,000 mg Oral BID  . living well with diabetes book   Does not apply Once  . mupirocin ointment  1 application Nasal BID  . oxyCODONE  10 mg Oral Daily  . simvastatin  10 mg Oral q1800  . DISCONTD: levETIRAcetam  500 mg Oral BID    Assessment/Plan:    Patient Active Hospital Problem List: SEIZURE DISORDER (07/07/2010)   Assessment: currently having non-rhytmic jerking of right upper extremity, he is fully alert and oriented able to follow all commands.    Plan: Have increased Keppra to 1 gram BID and will continue at this dose.     Etta Quill PA-C [facility] [phone]  02/01/2012, 12:01 PM

## 2012-02-01 NOTE — Progress Notes (Signed)
Inpatient Diabetes Program Recommendations  AACE/ADA: New Consensus Statement on Inpatient Glycemic Control (2009)  Target Ranges:  Prepandial:   less than 140 mg/dL      Peak postprandial:   less than 180 mg/dL (1-2 hours)      Critically ill patients:  140 - 180 mg/dL   Reason for Visit: Newly-diagnosed DM  Results for STIVEN, REISENAUER (MRN [id number]) as of 02/01/2012 15:00  Ref. Range 01/31/2012 05:00  Hemoglobin A1C Latest Range: <5.7 % 10.1 (H)  Results for BRALEN, DESCHAINE (MRN [id number]) as of 02/01/2012 15:00  Ref. Range 01/31/2012 08:43 01/31/2012 11:36 01/31/2012 17:08 01/31/2012 20:46 02/01/2012 07:33 02/01/2012 11:43  Glucose-Capillary Latest Range: 70-99 mg/dL 281 (H) 250 (H) 296 (H) 231 (H) 202 (H) 225 (H)    Inpatient Diabetes Program Recommendations Insulin - Basal: Needs basal insulin - Add Lantus 10 units QHS if pt is not going to be discharged.  Note: Long discussion with pt about diabetes diagnosis.  Pt states he can "make a lot of changes with my diet."  Refuses to go home on insulin because he doesn't have use of R hand from CVA.  Has watched 4 diabetes videos and states he will followup with [facility] on May 31st.  Stressed importance of controlling blood sugars to prevent long-term complications.  RN continues diabetes education at bedside.  Will need prescription for glucose meter and supplies.  ? Possibility of pt's sister assisting with monitoring and/or insulin admin. since she handles his medications.    Discussed the above with RN.

## 2012-02-02 DIAGNOSIS — E119 Type 2 diabetes mellitus without complications: Secondary | ICD-10-CM | POA: Diagnosis present

## 2012-02-02 LAB — GLUCOSE, CAPILLARY
Glucose-Capillary: 174 mg/dL — ABNORMAL HIGH (ref 70–99)
Glucose-Capillary: 142 mg/dL — ABNORMAL HIGH (ref 70–99)

## 2012-02-02 MED ORDER — LEVETIRACETAM 1000 MG PO TABS: 1000.0000 mg | ORAL_TABLET | Freq: Two times a day (BID) | ORAL | Status: DC

## 2012-02-02 MED ORDER — METFORMIN HCL 1000 MG PO TABS: 1000.0000 mg | ORAL_TABLET | Freq: Two times a day (BID) | ORAL | Status: DC

## 2012-02-02 MED ORDER — METFORMIN HCL 500 MG PO TABS
1000.0000 mg | ORAL_TABLET | Freq: Two times a day (BID) | ORAL | Status: DC
  Administered 2012-02-02: 1000 mg via ORAL
  Filled 2012-02-02 (×3): qty 2

## 2012-02-02 NOTE — Discharge Instructions (Signed)
STROKE/TIA DISCHARGE INSTRUCTIONS SMOKING Cigarette smoking nearly doubles your risk of having a stroke & is the single most alterable risk factor  If you smoke or have smoked in the last 12 months, you are advised to quit smoking for your health.  Most of the excess cardiovascular risk related to smoking disappears within a year of stopping.  Ask you doctor about anti-smoking medications  [place] Quit Line: 1-800-QUIT NOW  Free Smoking Cessation Classes [phone]  CHOLESTEROL Know your levels; limit fat & cholesterol in your diet  Lipid Panel     Component Value Date/Time   CHOL 168 01/31/2012 0710   TRIG 122 01/31/2012 0710   HDL 40 01/31/2012 0710   CHOLHDL 4.2 01/31/2012 0710   VLDL 24 01/31/2012 0710   LDLCALC 104* 01/31/2012 0710      Many patients benefit from treatment even if their cholesterol is at goal.  Goal: Total Cholesterol (CHOL) less than 160  Goal:  Triglycerides (TRIG) less than 150  Goal:  HDL greater than 40  Goal:  LDL (LDLCALC) less than 100   BLOOD PRESSURE American Stroke Association blood pressure target is less that 120/80 mm/Hg  Your discharge blood pressure is:  BP: 116/74 mmHg  Monitor your blood pressure  Limit your salt and alcohol intake  Many individuals will require more than one medication for high blood pressure  DIABETES (A1c is a blood sugar average for last 3 months) Goal HGBA1c is under 7% (HBGA1c is blood sugar average for last 3 months)  Diabetes: Diagnosis of diabetes:  Your A1c:10.1 %    Lab Results  Component Value Date   HGBA1C 10.1* 01/31/2012     Your HGBA1c can be lowered with medications, healthy diet, and exercise.  Check your blood sugar as directed by your physician  Call your physician if you experience unexplained or low blood sugars.  PHYSICAL ACTIVITY/REHABILITATION Goal is 30 minutes at least 4 days per week    Activity:   Increase activity slowly,  Activity decreases your risk of heart attack and stroke and  makes your heart stronger.  It helps control your weight and blood pressure; helps you relax and can improve your mood.  Participate in a regular exercise program.  Talk with your doctor about the best form of exercise for you (dancing, walking, swimming, cycling).  DIET/WEIGHT Goal is to maintain a healthy weight  Your discharge diet is: Carb ControlCARBOHYDRATE MODIFIED MEDIUM DIABETIC DIET Your height is:  Height: 5\' 8"  (172.7 cm) Your current weight is: Weight: 77.6 kg (171 lb 1.2 oz) Your Body Mass Index (BMI) is:  BMI (Calculated): 26.1   Following the type of diet specifically designed for you will help prevent another stroke.  Your goal weight range is:     Your goal Body Mass Index (BMI) is 19-24.  Healthy food habits can help reduce 3 risk factors for stroke:  High cholesterol, hypertension, and excess weight.  RESOURCES Stroke/Support Group:  Call [phone]  they meet the 3rd Sunday of the month on the Rehab Unit at [facility], [place] ( no meetings June, July & Aug).  STROKE EDUCATION PROVIDED/REVIEWED AND GIVEN TO PATIENT Stroke warning signs and symptoms How to activate emergency medical system (call 911). Medications prescribed at discharge. Need for follow-up after discharge. Personal risk factors for stroke. Pneumonia vaccine given:   No Flu vaccine given:   No My questions have been answered, the writing is legible, and I understand these instructions.  I will adhere to  these goals & educational materials that have been provided to me after my discharge from the hospital.

## 2012-02-02 NOTE — Progress Notes (Signed)
Reviewed discharge instructions with patient and niece.  They stated their understanding.  Gluclose meter prescription given to patient and explained he can get one from [place].  Patient discharged on percocet, no prescription in chart; patient stated he had some at home.  Patient discharged via wheelchair.  Sanda Linger

## 2012-02-02 NOTE — Progress Notes (Signed)
Patient had 3 beat run of V-tach. Asymptomatic and sleeping. Vitals  T 97.8 P 91 R 18 BP 99/62 02 98 RA. Baltazar Najjar, NP notified. Will continue to monitor.

## 2012-02-02 NOTE — Discharge Summary (Signed)
Admit date: 01/30/2012 Discharge date: 02/02/2012  Primary Care Physician:  No primary provider on file.   Discharge Diagnoses:   Active Hospital Problems  Diagnoses Date Noted   . Diabetes mellitus type II 02/02/2012   . History of CVA (cerebrovascular accident) 01/30/2012   . 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 CIRRHOSIS, ALCOHOLIC 0000000   . HYPERTENSION, BENIGN 03/10/2008     Resolved Hospital Problems  Diagnoses Date Noted Date Resolved  . SEIZURE DISORDER 07/07/2010 02/02/2012  . Heart block AV second degree 02/01/2012 02/02/2012     DISCHARGE MEDICATION: Medication List  As of 02/02/2012  7:46 AM   TAKE these medications         carvedilol 3.125 MG tablet   Commonly known as: COREG   Take 3.125 mg by mouth 2 (two) times daily with a meal.      CREON 12000 UNITS Cpep   Generic drug: lipase/protease/amylase   Take 1 capsule by mouth daily.      cyclobenzaprine 10 MG tablet   Commonly known as: FLEXERIL   Take 10 mg by mouth 3 (three) times daily.      folic acid 1 MG tablet   Commonly known as: FOLVITE   Take 1 mg by mouth daily.      iron polysaccharides 150 MG capsule   Commonly known as: NIFEREX   Take 150 mg by mouth 2 (two) times daily.      levETIRAcetam 1000 MG tablet   Commonly known as: KEPPRA   Take 1 tablet (1,000 mg total) by mouth 2 (two) times daily.      LORazepam 1 MG tablet   Commonly known as: ATIVAN   Take 1 mg by mouth 2 (two) times daily.      metFORMIN 1000 MG tablet   Commonly known as: GLUCOPHAGE   Take 1 tablet (1,000 mg total) by mouth 2 (two) times daily with a meal.      mulitivitamin with minerals Tabs   Take 1 tablet by mouth daily.      NEXIUM 40 MG capsule   Generic drug: esomeprazole   Take 40 mg by mouth daily.      nitroGLYCERIN 0.4 MG SL tablet   Commonly known as: NITROSTAT   Place 0.4 mg under the tongue every 5 (five) minutes as needed. For chest pain      ondansetron 4 MG tablet   Commonly known as:  ZOFRAN   Take 4 mg by mouth every 8 (eight) hours as needed. For nausea      oxyCODONE 10 MG 12 hr tablet   Commonly known as: OXYCONTIN   Take 10 mg by mouth daily.      oxyCODONE-acetaminophen 5-325 MG per tablet   Commonly known as: PERCOCET   Take 1-2 tablets by mouth every 4 (four) hours as needed for pain.      sucralfate 1 G tablet   Commonly known as: CARAFATE   Take 1 g by mouth 3 (three) times daily.      thiamine 100 MG tablet   Commonly known as: VITAMIN B-1   Take 100 mg by mouth daily.      vitamin B-12 1000 MCG tablet   Commonly known as: CYANOCOBALAMIN   Take 1,000 mcg by mouth daily.              Consults: Treatment Team:  Catarina Hartshorn, MD [facility], MD   SIGNIFICANT DIAGNOSTIC STUDIES:  Ct Head Wo Contrast  01/30/2012  *  RADIOLOGY REPORT*  Clinical Data: Code stroke, aphasia  CT HEAD WITHOUT CONTRAST  Technique:  Contiguous axial images were obtained from the base of the skull through the vertex without contrast.  Comparison: 09/01/2010  Findings: Examination is degraded by patient motion.  Left occipital encephalomalacia again noted.  There is focal loss of gray-white differentiation and hypodensity of the gyri in the high left parietal lobe, new since the prior study.  Mild asymmetric prominence of the left lateral ventricle as compared to the right is stable.  Diffuse cortical volume loss noted with proportional ventricular prominence.  No acute hemorrhage or mass lesion identified.  No midline shift.  No acute osseous finding.  Orbits and paranasal sinuses are grossly intact incompletely imaged due to patient obliquity in the scanner.  IMPRESSION: High left parietal cortical hypodensity most compatible with acute infarct given the gyri involved and speech difficulties. Mass lesion is felt less likely.  Critical Value/emergent results were called by telephone at the time of interpretation on 01/30/2012  at 8:50 p.m.  to  Dr. Wyvonnia Dusky, who  verbally acknowledged these results.  Original Report Authenticated By: Arline Asp, M.D.   Mr Brain Wo Contrast  01/31/2012  *RADIOLOGY REPORT*  Clinical Data:  Right-sided weakness with aphasia.  Hypertension. Cirrhosis.  Suspected acute but ill-defined cerebrovascular disease.  MRI HEAD WITHOUT CONTRAST MRA HEAD WITHOUT CONTRAST  Technique:  Multiplanar, multiecho pulse sequences of the brain and surrounding structures were obtained without intravenous contrast. Angiographic images of the head were obtained using MRA technique without contrast.  Comparison:  CT head 01/30/2012.  Also CT head 09/01/2010.  Prior MR head 03/29/2010.  MRI HEAD  Findings:  No acute stroke, intracranial hemorrhage, mass lesion, hydrocephalus, or extra-axial fluid.  There is an old left parietal cortical and subcortical infarction without visible associated hemorrhage.  There is gliosis and encephalomalacia.  There is no midline shift.  Premature atrophy is present.  There is chronic microvascular ischemic change throughout the periventricular and subcortical white matter.  The major intracranial vascular structures appear patent.  Extracranial soft tissues grossly unremarkable.  Pituitary and cerebellar tonsils are unremarkable.  IMPRESSION: Remote left parietal infarction.  Atrophy and small vessel disease.  MRA HEAD  Findings: Right internal carotid artery demonstrates slight atheromatous outpouching inferior cavernous segment.  No focal stenosis in this location or elsewhere.  Left internal carotid artery normal throughout its skull base, and cavernous segments.  Slight nonstenotic narrowing supraclinoid ICA.  Basilar artery slightly hypoplastic, perhaps secondary to fetal right PCA origin. No focal stenosis.  Vertebrals codominant and widely patent.  No focal narrowing of the anterior, middle, or posterior cerebral arteries. No visible cerebellar branch occlusion.  Slight patient motion obscures detail of the distal MCA  and PCA branches.  IMPRESSION: No proximal flow reducing lesion is observed.  See discussion above.  Original Report Authenticated By: Staci Righter, M.D.   Mr Mra Head/brain Wo Cm  01/31/2012  *RADIOLOGY REPORT*  Clinical Data:  Right-sided weakness with aphasia.  Hypertension. Cirrhosis.  Suspected acute but ill-defined cerebrovascular disease.  MRI HEAD WITHOUT CONTRAST MRA HEAD WITHOUT CONTRAST  Technique:  Multiplanar, multiecho pulse sequences of the brain and surrounding structures were obtained without intravenous contrast. Angiographic images of the head were obtained using MRA technique without contrast.  Comparison:  CT head 01/30/2012.  Also CT head 09/01/2010.  Prior MR head 03/29/2010.  MRI HEAD  Findings:  No acute stroke, intracranial hemorrhage, mass lesion, hydrocephalus, or extra-axial fluid.  There is  an old left parietal cortical and subcortical infarction without visible associated hemorrhage.  There is gliosis and encephalomalacia.  There is no midline shift.  Premature atrophy is present.  There is chronic microvascular ischemic change throughout the periventricular and subcortical white matter.  The major intracranial vascular structures appear patent.  Extracranial soft tissues grossly unremarkable.  Pituitary and cerebellar tonsils are unremarkable.  IMPRESSION: Remote left parietal infarction.  Atrophy and small vessel disease.  MRA HEAD  Findings: Right internal carotid artery demonstrates slight atheromatous outpouching inferior cavernous segment.  No focal stenosis in this location or elsewhere.  Left internal carotid artery normal throughout its skull base, and cavernous segments.  Slight nonstenotic narrowing supraclinoid ICA.  Basilar artery slightly hypoplastic, perhaps secondary to fetal right PCA origin. No focal stenosis.  Vertebrals codominant and widely patent.  No focal narrowing of the anterior, middle, or posterior cerebral arteries. No visible cerebellar branch  occlusion.  Slight patient motion obscures detail of the distal MCA and PCA branches.  IMPRESSION: No proximal flow reducing lesion is observed.  See discussion above.  Original Report Authenticated By: Staci Righter, M.D.     Carotid Dopplers:  No significant extracranial carotid artery stenosis demonstrated. Veterbrals are patent with antegrade flow.   Recent Results (from the past 240 hour(s))  MRSA PCR SCREENING     Status: Abnormal   Collection Time   01/31/12 12:25 AM      Component Value Range Status Comment   MRSA by PCR POSITIVE (*) NEGATIVE  Final     BRIEF ADMITTING H & P: 56 year old [place] man with history of stroke in 2011 who was brought in today after being found at home unable to use his right side as well as having what appeared to be seizures. He has history of seizure disorder and is on Keppra for that. Patient was not able to speak coherently on arrival in the emergency room. He is also not able to use his right side. He has long-standing history of tobacco and alcohol abuse and has had medication noncompliance. His sister is here with him and she's responsible for his care. Patient apparently has not been on any aspirin even though he had a stroke 2 years ago. He was taken off aspirin about a year ago. He is also not on any statin even though he had a prior stroke and has history of hyperlipidemia. 2 years ago patient was found to have transient atrial fibrillation the rate was controlled with Coreg which he continues to take now. He converted into sinus rhythm at the time before discharge. He is able to understand and follow commands at the moment. Denied any specific complaints. The family noted that he had some seizure-like activity during the onset of his weakness. He has been evaluated by neurology in the emergency room and found to be not a candidate for TPA.   Active Hospital Problems  Diagnoses Date Noted   . Diabetes mellitus type II: History running high in  the hospital greater than 200 hemoglobin A1c was checked and it showed it was 11. He was initially started on Lantus in case any procedures as needed he is newly diagnosed diabetic. Diabetes coordinator educator recall. He was changed to metformin which she tolerated this well his blood glucose was much improved. He'll followup with his primary care Dr. As an outpatient.  02/02/2012   . History of CVA (cerebrovascular accident): No changes were made.  01/30/2012   . CIRRHOSIS, ALCOHOLIC: Vital signs of  withdrawal LFTs remained within normal limits.  02/23/2010   . HYPERTENSION, BENIGN: Actually his blood pressure was very well controlled the hospital stay.  03/10/2008     Resolved Hospital Problems  Diagnoses Date Noted Date Resolved  . SEIZURE DISORDER: Neurology was consulted recommended to rule out a stroke MRI was done that showed results as above. They recommended an EEG which was done and showed: abnormal EEG secondary to left-sided sharp activity. This finding is consistent with a focal disturbance with epileptogenic potential. Differential includes stroke among other  possibilities. Neurology recommended to increase her Keppra was was done he had no further seizures here in the hospital. Carotid Dopplers was done which showed results above.  07/07/2010 02/02/2012  . Heart block AV second degree: Patient asymptomatic Wenckebach block during his sleep. Which returned to sinus rhythm, spontaneously. Cardiology was consulted, as he was symptomatic and it was nocturnal they recommended no further no device implantation.  02/01/2012 02/02/2012     Disposition and Follow-up:  Discharge Orders    Future Orders Please Complete By Expires   Diet - low sodium heart healthy      Increase activity slowly        Follow-up Information    Follow up with Aldona Bar, MD in 1 week. (Appointment Friday, May 31st @ 2:00 p.m.)    Contact information:   [address]  [id number]           DISCHARGE EXAM:  General: Alert, awake, oriented x3, in no acute distress.  HEENT: No bruits, no goiter.  Heart: Regular rate and rhythm, without murmurs, rubs, gallops.  Lungs: Crackles left side, bilateral air movement.  Abdomen: Soft, nontender, nondistended, positive bowel sounds.  Neuro: Grossly intact, nonfocal, except for surgical stroke lesion   Blood pressure 99/62, pulse 91, temperature 97.8 F (36.6 C), temperature source Oral, resp. rate 18, height 5\' 8"  (1.727 m), weight 77.6 kg (171 lb 1.2 oz), SpO2 98.00%.   Basename 02/01/12 0939 01/30/12 2113 01/30/12 2054  NA 137 138 --  K 4.0 4.2 --  CL 105 106 --  CO2 21 -- 18*  GLUCOSE 240* 291* --  BUN 6 7 --  CREATININE 0.83 0.Steven --  CALCIUM 8.6 -- 9.4  MG -- -- --  PHOS -- -- --    Basename 02/01/12 0939 01/30/12 2054  AST 31 22  ALT 22 24  ALKPHOS 145* 151*  BILITOT 0.6 0.5  PROT 6.6 7.3  ALBUMIN 3.1* 3.6   No results found for this basename: LIPASE:2,AMYLASE:2 in the last 72 hours  Basename 02/01/12 0939 01/30/12 2113 01/30/12 2054  WBC 7.3 -- 12.3*  NEUTROABS -- -- 8.8*  HGB 12.7* 15.3 --  HCT 40.8 45.0 --  MCV 94.9 -- 92.6  PLT 270 -- 249    Signed: Charlynne Cousins M.D. 02/02/2012, 7:46 AM

## 2012-02-02 NOTE — Progress Notes (Signed)
Patient heart rate dropping to the 30-40's while sleeping. Patient asymptomatic. EP aware and has seen patient. BP 116/60. HR 60's-80's now. Baltazar Najjar, NP made aware. Told to monitor patient. Will continue to monitor.

## 2012-02-04 NOTE — ED Provider Notes (Signed)
History    56 year old male stroke alert. Around 7:30 this evening patient had acute mental status change. He was aphasic as well. He subsequently developed focal motor seizure activity involving his right upper and right lower extremity in route to the hospital. Patient does have a history of a previous stroke and a seizure disorder. Also history of alcohol abuse. Seizure activity abated with Ativan in ED. No hx of trauma that family is aware of. No fever.   CSN: [id number]  Arrival date & time 01/30/12  2028   First MD Initiated Contact with Patient 01/30/12 2052      Chief Complaint  Patient presents with  . Code Stroke    (Consider location/radiation/quality/duration/timing/severity/associated sxs/prior treatment) HPI  Past Medical History  Diagnosis Date  . Angioedema     rx requiring intubation/vent support suspected secondary to ACE1 (but also on zithromax) 5/09 hospitalization   . Smoker   . ETOH abuse   . Chronic pancreatitis     (10/08 [facility]). admx 02/2010 pseudocyst aspirated during admxn. pancreatic dual stent placement at [facility] 11/11  . Cirrhosis     due to ETOH with recent hepatic abcess and possible HCC.   . Gastritis     alcohol induced  . Depression     no hx of meds  . Elevated BP     boarderline elevation in BP in past  . VF (ventricular fibrillation)     arrest 6/11. successfully rescucitated w/prolinged hospitalization due to respitatory failure. QT prolongation during cooling, now resolved  . History of stroke   . 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 Anxiety   . Seizures   . Edema   . Abscess of liver   . Anoxic brain damage   . Anemia   . Other convulsions   . Hypertension     Past Surgical History  Procedure Date  . Inguinal hernia repair     right  . Right wrist orif     for fx  . Pancreatic stent placement     Family History  Problem Relation Age of Onset  . Coronary artery disease Neg Hx   .  Colon cancer Neg Hx     History  Substance Use Topics  . Smoking status: Former Smoker -- 1.0 packs/day for 20 years    Quit date: 05/26/2010  . Smokeless tobacco: Former Systems developer    Quit date: 05/26/2010  . Alcohol Use: No     hx of alcohol abuse, last used prior to hospitalization for VF arrest 6/11. denies further ETOH since that time. has never been in AA      Review of Systems  Allergies  Azithromycin and Lisinopril  Home Medications   Current Outpatient Rx  Name Route Sig Dispense Refill  . CARVEDILOL 3.125 MG PO TABS Oral Take 3.125 mg by mouth 2 (two) times daily with a meal.    . CYCLOBENZAPRINE HCL 10 MG PO TABS Oral Take 10 mg by mouth 3 (three) times daily.    Marland Kitchen ESOMEPRAZOLE MAGNESIUM 40 MG PO CPDR Oral Take 40 mg by mouth daily.     Marland Kitchen FOLIC ACID 1 MG PO TABS Oral Take 1 mg by mouth daily.      Marland Kitchen POLYSACCHARIDE IRON COMPLEX 150 MG PO CAPS Oral Take 150 mg by mouth 2 (two) times daily.      Marland Kitchen PANCRELIPASE (LIP-PROT-AMYL) 12000 UNITS PO CPEP Oral Take 1 capsule by mouth daily.      Marland Kitchen  LORAZEPAM 1 MG PO TABS Oral Take 1 mg by mouth 2 (two) times daily.    . ADULT MULTIVITAMIN W/MINERALS CH Oral Take 1 tablet by mouth daily.    Marland Kitchen NITROGLYCERIN 0.4 MG SL SUBL Sublingual Place 0.4 mg under the tongue every 5 (five) minutes as needed. For chest pain    . ONDANSETRON HCL 4 MG PO TABS Oral Take 4 mg by mouth every 8 (eight) hours as needed. For nausea    . OXYCODONE HCL ER 10 MG PO TB12 Oral Take 10 mg by mouth daily.    . SUCRALFATE 1 G PO TABS Oral Take 1 g by mouth 3 (three) times daily.      Marland Kitchen VITAMIN B-1 100 MG PO TABS Oral Take 100 mg by mouth daily.      Marland Kitchen VITAMIN B-12 1000 MCG PO TABS Oral Take 1,000 mcg by mouth daily.      Marland Kitchen LEVETIRACETAM 1000 MG PO TABS Oral Take 1 tablet (1,000 mg total) by mouth 2 (two) times daily. 60 tablet 0  . METFORMIN HCL 1000 MG PO TABS Oral Take 1 tablet (1,000 mg total) by mouth 2 (two) times daily with a meal. 60 tablet 0  .  OXYCODONE-ACETAMINOPHEN 5-325 MG PO TABS Oral Take 1-2 tablets by mouth every 4 (four) hours as needed for pain. 10 tablet 0    BP 139/89  Pulse 90  Temp(Src) 97.5 F (36.4 C) (Oral)  Resp 20  Ht 5\' 8"  (1.727 m)  Wt 171 lb 1.2 oz (77.6 kg)  BMI 26.01 kg/m2  SpO2 98%  Physical Exam  Nursing note and vitals reviewed. Constitutional: He appears well-developed and well-nourished. No distress.  HENT:  Head: Normocephalic and atraumatic.  Eyes: Conjunctivae are normal. Right eye exhibits no discharge. Left eye exhibits no discharge.  Neck: Neck supple.  Cardiovascular: Normal rate, regular rhythm and normal heart sounds.  Exam reveals no gallop and no friction rub.   No murmur heard. Pulmonary/Chest: Effort normal and breath sounds normal. No respiratory distress.  Abdominal: Soft. He exhibits no distension. There is no tenderness.  Musculoskeletal: He exhibits no edema and no tenderness.  Neurological: He is alert.       Speech somewhat dysarthric but content is appropriate. Follows commands. Cranial nerves II through XII are intact. Pupils are equal round reactive to light. Right upper M.D. with a slight flexion contracture. Strength is diminished in the right upper and lower extremities. Strength is normal left upper and lower extremities. Biceps and patellar reflexes are one plus.  Skin: Skin is warm and dry.  Psychiatric: He has a normal mood and affect. His behavior is normal. Thought content normal.    ED Course  Procedures (including critical care time)  Labs Reviewed  CBC - Abnormal; Notable for the following:    WBC 12.3 (*)    All other components within normal limits  DIFFERENTIAL - Abnormal; Notable for the following:    Neutro Abs 8.8 (*)    All other components within normal limits  COMPREHENSIVE METABOLIC PANEL - Abnormal; Notable for the following:    CO2 18 (*)    Glucose, Bld 272 (*)    Alkaline Phosphatase 151 (*)    All other components within normal limits   GLUCOSE, CAPILLARY - Abnormal; Notable for the following:    Glucose-Capillary 263 (*)    All other components within normal limits  POCT I-STAT, CHEM 8 - Abnormal; Notable for the following:    Glucose, Bld  291 (*)    All other components within normal limits  HEMOGLOBIN A1C - Abnormal; Notable for the following:    Hemoglobin A1C 10.1 (*)    Mean Plasma Glucose 243 (*)    All other components within normal limits  LIPID PANEL - Abnormal; Notable for the following:    LDL Cholesterol 104 (*)    All other components within normal limits  MRSA PCR SCREENING - Abnormal; Notable for the following:    MRSA by PCR POSITIVE (*)    All other components within normal limits  GLUCOSE, CAPILLARY - Abnormal; Notable for the following:    Glucose-Capillary 281 (*)    All other components within normal limits  GLUCOSE, CAPILLARY - Abnormal; Notable for the following:    Glucose-Capillary 250 (*)    All other components within normal limits  GLUCOSE, CAPILLARY - Abnormal; Notable for the following:    Glucose-Capillary 296 (*)    All other components within normal limits  GLUCOSE, CAPILLARY - Abnormal; Notable for the following:    Glucose-Capillary 231 (*)    All other components within normal limits  GLUCOSE, CAPILLARY - Abnormal; Notable for the following:    Glucose-Capillary 202 (*)    All other components within normal limits  COMPREHENSIVE METABOLIC PANEL - Abnormal; Notable for the following:    Glucose, Bld 240 (*)    Albumin 3.1 (*)    Alkaline Phosphatase 145 (*)    All other components within normal limits  CBC - Abnormal; Notable for the following:    Hemoglobin 12.7 (*)    All other components within normal limits  GLUCOSE, CAPILLARY - Abnormal; Notable for the following:    Glucose-Capillary 225 (*)    All other components within normal limits  GLUCOSE, CAPILLARY - Abnormal; Notable for the following:    Glucose-Capillary 202 (*)    All other components within normal  limits  GLUCOSE, CAPILLARY - Abnormal; Notable for the following:    Glucose-Capillary 125 (*)    All other components within normal limits  GLUCOSE, CAPILLARY - Abnormal; Notable for the following:    Glucose-Capillary 174 (*)    All other components within normal limits  GLUCOSE, CAPILLARY - Abnormal; Notable for the following:    Glucose-Capillary 142 (*)    All other components within normal limits  PROTIME-INR  APTT  CK TOTAL AND CKMB  TROPONIN I  URINE RAPID DRUG SCREEN (HOSP PERFORMED)  LAB REPORT - SCANNED   No results found.   1. CVA (cerebral infarction)       MDM  41yM with CVA. Not TPA candidate. Evaluated by neurology in ED. Admission.        Virgel Manifold, MD 02/04/12 1655

## 2012-04-06 ENCOUNTER — Encounter (HOSPITAL_COMMUNITY): Payer: Self-pay

## 2012-04-06 ENCOUNTER — Emergency Department (HOSPITAL_COMMUNITY)
Admission: EM | Admit: 2012-04-06 | Discharge: 2012-04-06 | Disposition: A | Discharge status: Home / Self Care | Payer: Self-pay | Attending: Emergency Medicine | Admitting: Emergency Medicine

## 2012-04-06 DIAGNOSIS — Z87891 Personal history of nicotine dependence: Secondary | ICD-10-CM | POA: Insufficient documentation

## 2012-04-06 DIAGNOSIS — Z8673 Personal history of transient ischemic attack (TIA), and cerebral infarction without residual deficits: Secondary | ICD-10-CM | POA: Insufficient documentation

## 2012-04-06 DIAGNOSIS — R569 Unspecified convulsions: Secondary | ICD-10-CM | POA: Insufficient documentation

## 2012-04-06 DIAGNOSIS — I10 Essential (primary) hypertension: Secondary | ICD-10-CM | POA: Insufficient documentation

## 2012-04-06 DIAGNOSIS — D649 Anemia, unspecified: Secondary | ICD-10-CM | POA: Insufficient documentation

## 2012-04-06 LAB — GLUCOSE, CAPILLARY: Glucose-Capillary: 157 mg/dL — ABNORMAL HIGH (ref 70–99)

## 2012-04-06 MED ORDER — LEVETIRACETAM 500 MG PO TABS
1000.0000 mg | ORAL_TABLET | Freq: Once | ORAL | Status: AC
  Administered 2012-04-06: 1000 mg via ORAL
  Filled 2012-04-06: qty 2

## 2012-04-06 MED ORDER — LORAZEPAM 2 MG/ML IJ SOLN
1.0000 mg | Freq: Once | INTRAMUSCULAR | Status: AC
  Administered 2012-04-06: 1 mg via INTRAVENOUS
  Filled 2012-04-06: qty 1

## 2012-04-06 MED ORDER — HYDROMORPHONE HCL PF 1 MG/ML IJ SOLN
1.0000 mg | Freq: Once | INTRAMUSCULAR | Status: AC
  Administered 2012-04-06: 1 mg via INTRAVENOUS
  Filled 2012-04-06: qty 1

## 2012-04-06 MED ORDER — SODIUM CHLORIDE 0.9 % IV BOLUS (SEPSIS)
500.0000 mL | Freq: Once | INTRAVENOUS | Status: AC
  Administered 2012-04-06: 1000 mL via INTRAVENOUS

## 2012-04-06 MED ORDER — SODIUM CHLORIDE 0.9 % IV BOLUS (SEPSIS)
1000.0000 mL | Freq: Once | INTRAVENOUS | Status: AC
  Administered 2012-04-06: 1000 mL via INTRAVENOUS

## 2012-04-06 MED ORDER — LEVETIRACETAM 1000 MG PO TABS: 1000.0000 mg | ORAL_TABLET | Freq: Two times a day (BID) | ORAL | Status: DC

## 2012-04-06 NOTE — ED Provider Notes (Signed)
See prior note   Janice Norrie, MD 04/06/12 1538

## 2012-04-06 NOTE — ED Provider Notes (Signed)
Relates he had his first seizure 2 years ago, as a seizure every couple months. He states his last seizure 3 months ago. Relates he usually a seizure related to `severe pain. He states today he was watching TV and without warning had a seizure. He denies any injury from the seizure today. States he is taking his medications.  Does not recall the name of his neurologist.   PC[facility]  Pt has contraction deformity of her RUE.   Medical screening examination/treatment/procedure(s) were conducted as a shared visit with non-physician practitioner(s) and myself.  I personally evaluated the patient during the encounter  Rolland Porter, MD, Alanson Aly, MD 04/06/12 [phone]

## 2012-04-06 NOTE — Discharge Instructions (Signed)
Please follow up with [facility] for further evaluation.  Take your seizure medication as prescribed.  Follow up with your doctor for further care.  Seizure, Adult A seizure is when the body shakes uncontrollably (convulsion). It can be a scary experience. A seizure is not a diagnosis. It is a sign that something else may be wrong with brain and/or spinal cord (central nervous system). In the Emergency Department, your condition is evaluated. The seizure is then treated. You will likely need follow-up with your caregiver. You will possibly need further testing and evaluation. Your caregiver or the specialist to whom you are referred will determine if further treatment is needed. After a seizure, you may be confused, dazed and drowsy. These problems (symptoms) often follow a seizure. Medication given to treat the seizure may also cause some of these changes. The time following a seizure is known as a refractory period. Hospital admission is seldom required unless there are other conditions present such as trauma or metabolic problems. Sometimes the seizure activity follows a fainting episode. This may have been caused by a brief drop in blood pressure. These fainting (syncopal) seizures are generally not a cause for concern.  HOME CARE INSTRUCTIONS   Follow up with your caregiver as suggested.   If any problems happen, get help right away.   Do not swim or drive until your caregiver says it is okay.  Document Released: 09/23/2000 Document Revised: 09/15/2011 Document Reviewed: 09/14/2011 [facility] Patient Information 2012 [place].

## 2012-04-06 NOTE — ED Provider Notes (Signed)
History     CSN: [id number]  Arrival date & time 04/06/12  1255   First MD Initiated Contact with Patient 04/06/12 1337      Chief Complaint  Patient presents with  . Seizures    (Consider location/radiation/quality/duration/timing/severity/associated sxs/prior treatment) HPI  56 year old male with multiple comorbidities including stroke, seizure, and chronic pancreatitis secondary to alcohol abuse presents with a seizure episode.  Patient states he was sitting watch TV when he apparently had a witnessed seizure episode. He does not recall what exactly happened, he denies any prior aura but sts whenever his R arm is in pain he is susceptible to having seizure. Currently he only complaining of pain and cramping to his right arm which he has had similar symptoms for the past 3 years. His last seizure episode was 3 months ago. He is currently taking Keppra. States he has been taking his medication regularly. He denies any other change in activities. Denies recent alcohol use or recreational drug use. Currently endorsing mild headache but denies neck pain, chest pain or shortness of breath, tongue biting, or urinary incontinence.  Pt request for pain medication.    Past Medical History  Diagnosis Date  . Angioedema     rx requiring intubation/vent support suspected secondary to ACE1 (but also on zithromax) 5/09 hospitalization   . Smoker   . ETOH abuse   . Chronic pancreatitis     (10/08 hops). admx 02/2010 pseudocyst aspirated during admxn. pancreatic dual stent placement at [facility] 11/11  . Cirrhosis     due to ETOH with recent hepatic abcess and possible HCC.   . Gastritis     alcohol induced  . Depression     no hx of meds  . Elevated BP     boarderline elevation in BP in past  . VF (ventricular fibrillation)     arrest 6/11. successfully rescucitated w/prolinged hospitalization due to respitatory failure. QT prolongation during cooling, now resolved  . History of stroke   .  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 Anxiety   . Seizures   . Edema   . Abscess of liver   . Anoxic brain damage   . Anemia   . Other convulsions   . Hypertension     Past Surgical History  Procedure Date  . Inguinal hernia repair     right  . Right wrist orif     for fx  . Pancreatic stent placement     Family History  Problem Relation Age of Onset  . Coronary artery disease Neg Hx   . Colon cancer Neg Hx     History  Substance Use Topics  . Smoking status: Former Smoker -- 1.0 packs/day for 20 years    Quit date: 05/26/2010  . Smokeless tobacco: Former Systems developer    Quit date: 05/26/2010  . Alcohol Use: No     hx of alcohol abuse, last used prior to hospitalization for VF arrest 6/11. denies further ETOH since that time. has never been in AA      Review of Systems  Unable to perform ROS Constitutional: Negative for fever.  HENT: Negative for neck pain.   Respiratory: Negative for chest tightness and shortness of breath.   Cardiovascular: Negative for chest pain.  Gastrointestinal: Negative for abdominal pain.  Neurological: Positive for headaches.  Psychiatric/Behavioral: Negative.   All other systems reviewed and are negative.    Allergies  Azithromycin and Lisinopril  Home Medications  Current Outpatient Rx  Name Route Sig Dispense Refill  . CARVEDILOL 3.125 MG PO TABS Oral Take 3.125 mg by mouth 2 (two) times daily with a meal.    . CYCLOBENZAPRINE HCL 10 MG PO TABS Oral Take 10 mg by mouth 3 (three) times daily.    Marland Kitchen ESOMEPRAZOLE MAGNESIUM 40 MG PO CPDR Oral Take 40 mg by mouth daily.     Marland Kitchen FOLIC ACID 1 MG PO TABS Oral Take 1 mg by mouth daily.     Marland Kitchen POLYSACCHARIDE IRON COMPLEX 150 MG PO CAPS Oral Take 150 mg by mouth 2 (two) times daily.     Marland Kitchen LEVETIRACETAM 1000 MG PO TABS Oral Take 500 mg by mouth 2 (two) times daily.    Marland Kitchen PANCRELIPASE (LIP-PROT-AMYL) 12000 UNITS PO CPEP Oral Take 1 capsule by mouth daily.       Marland Kitchen LORAZEPAM 1 MG PO TABS Oral Take 1 mg by mouth 2 (two) times daily.    Marland Kitchen METFORMIN HCL 1000 MG PO TABS Oral Take 1 tablet (1,000 mg total) by mouth 2 (two) times daily with a meal. 60 tablet 0  . ADULT MULTIVITAMIN W/MINERALS CH Oral Take 1 tablet by mouth daily.    Marland Kitchen ONDANSETRON HCL 4 MG PO TABS Oral Take 4 mg by mouth every 8 (eight) hours as needed. For nausea    . OXYCODONE HCL ER 10 MG PO TB12 Oral Take 10 mg by mouth daily.    . SUCRALFATE 1 G PO TABS Oral Take 1 g by mouth 3 (three) times daily.     Marland Kitchen VITAMIN B-1 100 MG PO TABS Oral Take 100 mg by mouth daily.     Marland Kitchen VITAMIN B-12 1000 MCG PO TABS Oral Take 1,000 mcg by mouth daily.     Marland Kitchen NITROGLYCERIN 0.4 MG SL SUBL Sublingual Place 0.4 mg under the tongue every 5 (five) minutes as needed. For chest pain      There were no vitals taken for this visit.  Physical Exam  Nursing note and vitals reviewed. Constitutional: He is oriented to person, place, and time. He appears well-developed and well-nourished.       Mild dysarthria but with normal content  HENT:  Head: Normocephalic and atraumatic.       Lips dry, no evidence of tongue biting.   Eyes: Conjunctivae and EOM are normal.  Neck: Normal range of motion. Neck supple.  Cardiovascular:       Tachycardia on exam without M/R/G  Abdominal: Soft. There is no tenderness.  Neurological: He is alert and oriented to person, place, and time.       Right arm in a decorticate position, with active muscle spasm but no obvious signs of trauma.   A&Ox3, Cranial II-XII grossly intact.  Sensation to light touch intact to lower extremities.    Skin: Skin is warm. No rash noted.    ED Course  Procedures (including critical care time)  Labs Reviewed - No data to display No results found.   No diagnosis found.    MDM  Witnessed seizure episode.  Last seizure 3 months ago.  Currently taking Keppra.  No significant finding on today's exam.  Pt c/o R arm pain with muscle spasm, has  similar sxs x 3 years. Pt takes oxycodone 10mg  PO daily for his pain. Has pain medication at home.  Complain that pain has increased.  Pt does not know who is his neurologist.  Discuss care with my attending.  My attending  recommend no further testing during this visit as pt denies any sxs suggestive of infection, alcohol/drug use or other precipitating sxs triggering his seizure.      3:07 PM Arm pain and tremor improves with pain medication and ativan.  Will consult with neurology for further care.    3:18 PM I have consulted with Etta Quill, PA-C from [facility] who request to give pt one month worth of Keppra and to have pt f/u with his neurologist at [facility].  Pt sts the last time he saw his neurologist was 1 year ago.  I strongly recommend close follow up.  Pt currently in NAD, VSS.      Domenic Moras, PA-C 04/06/12 1536

## 2012-04-06 NOTE — ED Notes (Signed)
Pt from home.  Witness seizure x 3-56minutes.  Post-ictal per ems. Last seizure was 73months ago. EMS reports no incontinence or oral trama, pupils were PERRLA.  Versed 2.5mg  IV given by ems for continued "spasms" which stopped after the versed was given.

## 2012-04-06 NOTE — ED Notes (Addendum)
D/c instructions reviewed with pt's sister.  Verbalizes understanding of instructions & f/u.  Pt out of dept via w/c with sister. dph

## 2012-04-06 NOTE — Progress Notes (Signed)
Pt listed as self pay with no insurance coverage Pt confirms he is self pay [employer] resident CM and [place] liaison spoke with him Pt offered [facility] services to assist with finding a [place] self pay provider Pt accepted information

## 2012-04-06 NOTE — ED Notes (Signed)
[id number] Expected date:04/06/12<BR> Expected time:<BR> Means of arrival:<BR> Comments:<BR> M41 - 56yom - seizure - hx of same

## 2012-06-01 ENCOUNTER — Encounter (HOSPITAL_COMMUNITY): Payer: Self-pay | Admitting: Emergency Medicine

## 2012-06-01 ENCOUNTER — Emergency Department (INDEPENDENT_AMBULATORY_CARE_PROVIDER_SITE_OTHER)
Admission: EM | Admit: 2012-06-01 | Discharge: 2012-06-01 | Disposition: A | Discharge status: Home / Self Care | Payer: Self-pay | Source: Home / Self Care | Attending: Family Medicine | Admitting: Family Medicine

## 2012-06-01 DIAGNOSIS — G894 Chronic pain syndrome: Secondary | ICD-10-CM

## 2012-06-01 MED ORDER — ESOMEPRAZOLE MAGNESIUM 40 MG PO CPDR: 40.0000 mg | DELAYED_RELEASE_CAPSULE | Freq: Every day | ORAL | Status: DC

## 2012-06-01 MED ORDER — OXYCODONE HCL 10 MG PO TB12: 10.0000 mg | ORAL_TABLET | ORAL | Status: DC

## 2012-06-01 MED ORDER — LEVETIRACETAM 1000 MG PO TABS: 1000.0000 mg | ORAL_TABLET | Freq: Two times a day (BID) | ORAL | Status: DC

## 2012-06-01 MED ORDER — LORAZEPAM 1 MG PO TABS: 1.0000 mg | ORAL_TABLET | Freq: Three times a day (TID) | ORAL | Status: AC | PRN

## 2012-06-01 MED ORDER — FOLIC ACID 1 MG PO TABS: 1.0000 mg | ORAL_TABLET | Freq: Every day | ORAL | Status: DC

## 2012-06-01 NOTE — ED Notes (Signed)
Pt [facility] pt and is out of several medications, was told to come here for refills until he finds a PCP.

## 2012-06-01 NOTE — ED Provider Notes (Signed)
History     CSN: [id number]  Arrival date & time 06/01/12  1341   First MD Initiated Contact with Patient 06/01/12 1350      Chief Complaint  Patient presents with  . Medication Refill    (Consider location/radiation/quality/duration/timing/severity/associated sxs/prior treatment) Patient is a 56 y.o. male presenting with musculoskeletal pain. The history is provided by the patient and the spouse.  Muscle Pain This is a chronic (here for refill of meds, from [facility].) problem. Pertinent negatives include no chest pain and no abdominal pain.    Past Medical History  Diagnosis Date  . Angioedema     rx requiring intubation/vent support suspected secondary to ACE1 (but also on zithromax) 5/09 hospitalization   . Smoker   . ETOH abuse   . Chronic pancreatitis     (10/08 [facility]). admx 02/2010 pseudocyst aspirated during admxn. pancreatic dual stent placement at [facility] 11/11  . Cirrhosis     due to ETOH with recent hepatic abcess and possible HCC.   . Gastritis     alcohol induced  . Depression     no hx of meds  . Elevated BP     boarderline elevation in BP in past  . VF (ventricular fibrillation)     arrest 6/11. successfully rescucitated w/prolinged hospitalization due to respitatory failure. QT prolongation during cooling, now resolved  . History of stroke   . 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 Anxiety   . Seizures   . Edema   . Abscess of liver   . Anoxic brain damage   . Anemia   . Other convulsions   . Hypertension     Past Surgical History  Procedure Date  . Inguinal hernia repair     right  . Right wrist orif     for fx  . Pancreatic stent placement     Family History  Problem Relation Age of Onset  . Coronary artery disease Neg Hx   . Colon cancer Neg Hx     History  Substance Use Topics  . Smoking status: Former Smoker -- 1.0 packs/day for 20 years    Quit date: 05/26/2010  . Smokeless tobacco:  Former Systems developer    Quit date: 05/26/2010  . Alcohol Use: No     hx of alcohol abuse, last used prior to hospitalization for VF arrest 6/11. denies further ETOH since that time. has never been in AA      Review of Systems  Constitutional: Negative.   Cardiovascular: Negative for chest pain.  Gastrointestinal: Negative for abdominal pain.  Musculoskeletal: Positive for joint swelling.    Allergies  Azithromycin and Lisinopril  Home Medications   Current Outpatient Rx  Name Route Sig Dispense Refill  . CARVEDILOL 3.125 MG PO TABS Oral Take 3.125 mg by mouth 2 (two) times daily with a meal.    . CYCLOBENZAPRINE HCL 10 MG PO TABS Oral Take 10 mg by mouth 3 (three) times daily.    Marland Kitchen ESOMEPRAZOLE MAGNESIUM 40 MG PO CPDR Oral Take 40 mg by mouth daily.     Marland Kitchen FOLIC ACID 1 MG PO TABS Oral Take 1 mg by mouth daily.     Marland Kitchen POLYSACCHARIDE IRON COMPLEX 150 MG PO CAPS Oral Take 150 mg by mouth 2 (two) times daily.     Marland Kitchen LEVETIRACETAM 1000 MG PO TABS Oral Take 1 tablet (1,000 mg total) by mouth 2 (two) times daily. 60 tablet 0  . PANCRELIPASE (LIP-PROT-AMYL)  12000 UNITS PO CPEP Oral Take 1 capsule by mouth daily.     Marland Kitchen LORAZEPAM 1 MG PO TABS Oral Take 1 mg by mouth 2 (two) times daily.    Marland Kitchen METFORMIN HCL 1000 MG PO TABS Oral Take 1 tablet (1,000 mg total) by mouth 2 (two) times daily with a meal. 60 tablet 0  . OXYCODONE HCL ER 10 MG PO TB12 Oral Take 10 mg by mouth daily.    . SUCRALFATE 1 G PO TABS Oral Take 1 g by mouth 3 (three) times daily.     Marland Kitchen VITAMIN B-1 100 MG PO TABS Oral Take 100 mg by mouth daily.     Marland Kitchen VITAMIN B-12 1000 MCG PO TABS Oral Take 1,000 mcg by mouth daily.     Marland Kitchen ESOMEPRAZOLE MAGNESIUM 40 MG PO CPDR Oral Take 1 capsule (40 mg total) by mouth daily. 30 capsule 0  . FOLIC ACID 1 MG PO TABS Oral Take 1 tablet (1 mg total) by mouth daily. 30 tablet 0  . LEVETIRACETAM 1000 MG PO TABS Oral Take 1 tablet (1,000 mg total) by mouth 2 (two) times daily. 60 tablet 0  . LORAZEPAM 1 MG PO  TABS Oral Take 1 tablet (1 mg total) by mouth 3 (three) times daily as needed for anxiety. 90 tablet 0  . ADULT MULTIVITAMIN W/MINERALS CH Oral Take 1 tablet by mouth daily.    Marland Kitchen NITROGLYCERIN 0.4 MG SL SUBL Sublingual Place 0.4 mg under the tongue every 5 (five) minutes as needed. For chest pain    . ONDANSETRON HCL 4 MG PO TABS Oral Take 4 mg by mouth every 8 (eight) hours as needed. For nausea    . OXYCODONE HCL ER 10 MG PO TB12 Oral Take 1 tablet (10 mg total) by mouth 1 day or 1 dose. 14 tablet 0    There were no vitals taken for this visit.  Physical Exam  Nursing note and vitals reviewed. Constitutional: He is oriented to person, place, and time. He appears well-developed and well-nourished.  HENT:  Head: Normocephalic.  Neurological: He is alert and oriented to person, place, and time.  Skin: Skin is warm and dry.    ED Course  Procedures (including critical care time)  Labs Reviewed - No data to display No results found.   1. Chronic pain syndrome       MDM          Billy Fischer, MD 06/01/12 1447

## 2012-07-26 ENCOUNTER — Emergency Department (HOSPITAL_COMMUNITY)
Admission: EM | Admit: 2012-07-26 | Discharge: 2012-07-26 | Disposition: A | Discharge status: Home / Self Care | Payer: Medicare Other | Attending: Emergency Medicine | Admitting: Emergency Medicine

## 2012-07-26 ENCOUNTER — Encounter (HOSPITAL_COMMUNITY): Payer: Self-pay | Admitting: Unknown Physician Specialty

## 2012-07-26 DIAGNOSIS — K861 Other chronic pancreatitis: Secondary | ICD-10-CM | POA: Diagnosis not present

## 2012-07-26 DIAGNOSIS — Z87891 Personal history of nicotine dependence: Secondary | ICD-10-CM | POA: Insufficient documentation

## 2012-07-26 DIAGNOSIS — R51 Headache: Secondary | ICD-10-CM | POA: Diagnosis not present

## 2012-07-26 DIAGNOSIS — Z8673 Personal history of transient ischemic attack (TIA), and cerebral infarction without residual deficits: Secondary | ICD-10-CM | POA: Insufficient documentation

## 2012-07-26 DIAGNOSIS — Z79899 Other long term (current) drug therapy: Secondary | ICD-10-CM | POA: Insufficient documentation

## 2012-07-26 DIAGNOSIS — G8929 Other chronic pain: Secondary | ICD-10-CM | POA: Diagnosis not present

## 2012-07-26 DIAGNOSIS — I10 Essential (primary) hypertension: Secondary | ICD-10-CM | POA: Diagnosis not present

## 2012-07-26 DIAGNOSIS — G40909 Epilepsy, unspecified, not intractable, without status epilepticus: Secondary | ICD-10-CM | POA: Diagnosis not present

## 2012-07-26 DIAGNOSIS — R569 Unspecified convulsions: Secondary | ICD-10-CM | POA: Diagnosis not present

## 2012-07-26 LAB — URINE MICROSCOPIC-ADD ON

## 2012-07-26 LAB — CBC WITH DIFFERENTIAL/PLATELET
Basophils Absolute: 0 10*3/uL (ref 0.0–0.1)
Basophils Relative: 0 % (ref 0–1)
Eosinophils Absolute: 0.1 10*3/uL (ref 0.0–0.7)
Eosinophils Relative: 0 % (ref 0–5)
HCT: 45.9 % (ref 39.0–52.0)
Hemoglobin: 14.7 g/dL (ref 13.0–17.0)
Lymphocytes Relative: 11 % — ABNORMAL LOW (ref 12–46)
Lymphs Abs: 1.6 10*3/uL (ref 0.7–4.0)
MCH: 30.6 pg (ref 26.0–34.0)
MCHC: 32 g/dL (ref 30.0–36.0)
MCV: 95.6 fL (ref 78.0–100.0)
Monocytes Absolute: 0.9 10*3/uL (ref 0.1–1.0)
Monocytes Relative: 6 % (ref 3–12)
Neutro Abs: 12 10*3/uL — ABNORMAL HIGH (ref 1.7–7.7)
Neutrophils Relative %: 82 % — ABNORMAL HIGH (ref 43–77)
Platelets: 269 10*3/uL (ref 150–400)
RBC: 4.8 MIL/uL (ref 4.22–5.81)
RDW: 14 % (ref 11.5–15.5)
WBC: 14.6 10*3/uL — ABNORMAL HIGH (ref 4.0–10.5)

## 2012-07-26 LAB — POCT I-STAT, CHEM 8
BUN: 18 mg/dL (ref 6–23)
BUN: 14 mg/dL (ref 6–23)
Calcium, Ion: 1.21 mmol/L (ref 1.12–1.23)
Calcium, Ion: 1.21 mmol/L (ref 1.12–1.23)
Chloride: 110 mEq/L (ref 96–112)
Chloride: 110 mEq/L (ref 96–112)
Creatinine, Ser: 1.7 mg/dL — ABNORMAL HIGH (ref 0.50–1.35)
Creatinine, Ser: 1.8 mg/dL — ABNORMAL HIGH (ref 0.50–1.35)
Glucose, Bld: 114 mg/dL — ABNORMAL HIGH (ref 70–99)
Glucose, Bld: 107 mg/dL — ABNORMAL HIGH (ref 70–99)
HCT: 48 % (ref 39.0–52.0)
HCT: 47 % (ref 39.0–52.0)
Hemoglobin: 16.3 g/dL (ref 13.0–17.0)
Hemoglobin: 16 g/dL (ref 13.0–17.0)
Potassium: 5.5 mEq/L — ABNORMAL HIGH (ref 3.5–5.1)
Potassium: 4 mEq/L (ref 3.5–5.1)
Sodium: 139 mEq/L (ref 135–145)
Sodium: 142 mEq/L (ref 135–145)
TCO2: 22 mmol/L (ref 0–100)
TCO2: 21 mmol/L (ref 0–100)

## 2012-07-26 LAB — URINALYSIS, ROUTINE W REFLEX MICROSCOPIC
Bilirubin Urine: NEGATIVE
Glucose, UA: NEGATIVE mg/dL
Ketones, ur: 15 mg/dL — AB
Leukocytes, UA: NEGATIVE
Nitrite: NEGATIVE
Protein, ur: 30 mg/dL — AB
Specific Gravity, Urine: 1.013 (ref 1.005–1.030)
Urobilinogen, UA: 0.2 mg/dL (ref 0.0–1.0)
pH: 5.5 (ref 5.0–8.0)

## 2012-07-26 MED ORDER — SODIUM CHLORIDE 0.9 % IV SOLN
1000.0000 mg | Freq: Once | INTRAVENOUS | Status: AC
  Administered 2012-07-26: 1000 mg via INTRAVENOUS
  Filled 2012-07-26: qty 10

## 2012-07-26 MED ORDER — LEVETIRACETAM 1000 MG PO TABS: 1000.0000 mg | ORAL_TABLET | Freq: Two times a day (BID) | ORAL | Status: DC

## 2012-07-26 MED ORDER — OXYCODONE HCL ER 10 MG PO T12A: 10.0000 mg | EXTENDED_RELEASE_TABLET | Freq: Two times a day (BID) | ORAL | Status: DC

## 2012-07-26 MED ORDER — LORAZEPAM 2 MG/ML IJ SOLN
1.0000 mg | Freq: Once | INTRAMUSCULAR | Status: AC
  Administered 2012-07-26: 2 mg via INTRAVENOUS
  Filled 2012-07-26: qty 1

## 2012-07-26 MED ORDER — LORAZEPAM 1 MG PO TABS: 1.0000 mg | ORAL_TABLET | Freq: Three times a day (TID) | ORAL | Status: DC | PRN

## 2012-07-26 MED ORDER — ONDANSETRON 4 MG PO TBDP
4.0000 mg | ORAL_TABLET | Freq: Once | ORAL | Status: AC
  Administered 2012-07-26: 4 mg via ORAL
  Filled 2012-07-26: qty 1

## 2012-07-26 MED ORDER — HYDROMORPHONE HCL PF 1 MG/ML IJ SOLN
1.0000 mg | Freq: Once | INTRAMUSCULAR | Status: AC
  Administered 2012-07-26: 1 mg via INTRAVENOUS
  Filled 2012-07-26: qty 1

## 2012-07-26 NOTE — ED Provider Notes (Signed)
History     CSN: [id number]  Arrival date & time 07/26/12  1232   First MD Initiated Contact with Patient 07/26/12 1300      Chief Complaint  Patient presents with  . Seizures    (Consider location/radiation/quality/duration/timing/severity/associated sxs/prior treatment) HPI Comments: Steven Frey 56 y.o. male   The chief complaint is: Patient presents with:   Seizures    Patient presents to the emergency department via EMS status post seizure today.  Patient has a past medical history of CVA, seizure disorder.  His sister is present and provides most of the history.  Patient is mildly post ictal and not back to baseline.  [facility]  He has been out of his medications for approximately one month.  Patient has spastic right upper extremity and chronic pain due to previous CVA.  His sister states that when he is off his medications and when his pain gets very bad it generally trigger seizure for him.  Patient was on the phone with his daughter and began speaking incoherently.  Patient's daughter called EMS as is her usual sign of seizure onset.   Patient complains of pain in his right hand which is chronic and mild headache.  Denies visual disturbance, lightheadedness, vertigo, injuries to mouth or tongue. When EMS arrived patient was in focal seizure and progressed to grand mal.  Denies fevers, chills, myalgias, arthralgias. Denies DOE, SOB, chest tightness or pressure, radiation to left arm, jaw or back, or diaphoresis. Denies dysuria, flank pain, suprapubic pain, frequency, urgency, or hematuria. Denies abdominal pain, nausea, vomiting, diarrhea or constipation.      The history is provided by the patient and a relative. No language interpreter was used.    Past Medical History  Diagnosis Date  . Angioedema     rx requiring intubation/vent support suspected secondary to ACE1 (but also on zithromax) 5/09 hospitalization   . Smoker     . ETOH abuse   . Chronic pancreatitis     (10/08 [facility]). admx 02/2010 pseudocyst aspirated during admxn. pancreatic dual stent placement at [facility] 11/11  . Cirrhosis     due to ETOH with recent hepatic abcess and possible HCC.   . Gastritis     alcohol induced  . Depression     no hx of meds  . Elevated BP     boarderline elevation in BP in past  . VF (ventricular fibrillation)     arrest 6/11. successfully rescucitated w/prolinged hospitalization due to respitatory failure. QT prolongation during cooling, now resolved  . History of stroke   . 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 Anxiety   . Seizures   . Edema   . Abscess of liver   . Anoxic brain damage   . Anemia   . Other convulsions   . Hypertension     Past Surgical History  Procedure Date  . Inguinal hernia repair     right  . Right wrist orif     for fx  . Pancreatic stent placement     Family History  Problem Relation Age of Onset  . Coronary artery disease Neg Hx   . Colon cancer Neg Hx     History  Substance Use Topics  . Smoking status: Former Smoker -- 1.0 packs/day for 20 years    Quit date: 05/26/2010  . Smokeless tobacco: Former Systems developer    Quit date: 05/26/2010  . Alcohol Use: No  hx of alcohol abuse, last used prior to hospitalization for VF arrest 6/11. denies further ETOH since that time. has never been in AA      Review of Systems  Constitutional: Negative for fever and chills.  HENT: Negative for voice change.   Eyes: Negative for photophobia and visual disturbance.  Respiratory: Negative for cough and shortness of breath.   Cardiovascular: Negative for chest pain and palpitations.  Gastrointestinal: Negative for vomiting, abdominal pain, diarrhea and constipation.  Genitourinary: Negative for dysuria, urgency and frequency.  Musculoskeletal: Negative for myalgias and arthralgias.  Skin: Negative for rash.  Neurological: Positive for headaches.  Negative for dizziness, syncope, facial asymmetry, speech difficulty, light-headedness and numbness.    Allergies  Azithromycin and Lisinopril  Home Medications   Current Outpatient Rx  Name Route Sig Dispense Refill  . CARVEDILOL 3.125 MG PO TABS Oral Take 3.125 mg by mouth 2 (two) times daily with a meal.    . CYCLOBENZAPRINE HCL 10 MG PO TABS Oral Take 10 mg by mouth 3 (three) times daily.    Marland Kitchen ESOMEPRAZOLE MAGNESIUM 40 MG PO CPDR Oral Take 40 mg by mouth daily.     Marland Kitchen FOLIC ACID 1 MG PO TABS Oral Take 1 mg by mouth daily.     Marland Kitchen POLYSACCHARIDE IRON COMPLEX 150 MG PO CAPS Oral Take 150 mg by mouth 2 (two) times daily.     Marland Kitchen LEVETIRACETAM 1000 MG PO TABS Oral Take 1 tablet (1,000 mg total) by mouth 2 (two) times daily. 60 tablet 0  . PANCRELIPASE (LIP-PROT-AMYL) 12000 UNITS PO CPEP Oral Take 1 capsule by mouth daily.     Marland Kitchen LORAZEPAM 1 MG PO TABS Oral Take 1 mg by mouth 2 (two) times daily.    Marland Kitchen METFORMIN HCL 1000 MG PO TABS Oral Take 1 tablet (1,000 mg total) by mouth 2 (two) times daily with a meal. 60 tablet 0  . ADULT MULTIVITAMIN W/MINERALS CH Oral Take 1 tablet by mouth daily.    Marland Kitchen NITROGLYCERIN 0.4 MG SL SUBL Sublingual Place 0.4 mg under the tongue every 5 (five) minutes as needed. For chest pain    . ONDANSETRON HCL 4 MG PO TABS Oral Take 4 mg by mouth every 8 (eight) hours as needed. For nausea    . OXYCODONE HCL ER 10 MG PO TB12 Oral Take 10 mg by mouth daily.    . SUCRALFATE 1 G PO TABS Oral Take 1 g by mouth 3 (three) times daily.     Marland Kitchen VITAMIN B-1 100 MG PO TABS Oral Take 100 mg by mouth daily.     Marland Kitchen VITAMIN B-12 1000 MCG PO TABS Oral Take 1,000 mcg by mouth daily.       BP 135/76  Pulse 95  Temp 98.7 F (37.1 C) (Oral)  Resp 19  SpO2 98%  Physical Exam  Nursing note and vitals reviewed. Constitutional: He appears well-developed and well-nourished. No distress.  HENT:  Head: Normocephalic and atraumatic.  Eyes: Conjunctivae normal and EOM are normal. Pupils are  equal, round, and reactive to light. No scleral icterus.  Neck: Normal range of motion. Neck supple.  Cardiovascular: Normal rate, regular rhythm and normal heart sounds.   Pulmonary/Chest: Effort normal and breath sounds normal. No respiratory distress.  Abdominal: Soft. There is no tenderness.  Musculoskeletal: He exhibits no edema.  Neurological: He is alert. No cranial nerve deficit.       Spastic right upper extremity.  Speech is mildy slurred but coherent.  Sister states this is baseline.  No cranial nerve deficits.  Skin: Skin is warm and dry. He is not diaphoretic.  Psychiatric: His behavior is normal.    ED Course  Procedures (including critical care time)  Labs Reviewed  CBC WITH DIFFERENTIAL - Abnormal; Notable for the following:    WBC 14.6 (*)     Neutrophils Relative 82 (*)     Neutro Abs 12.0 (*)     Lymphocytes Relative 11 (*)     All other components within normal limits  URINALYSIS, ROUTINE W REFLEX MICROSCOPIC - Abnormal; Notable for the following:    APPearance CLOUDY (*)     Hgb urine dipstick SMALL (*)     Ketones, ur 15 (*)     Protein, ur 30 (*)     All other components within normal limits  POCT I-STAT, CHEM 8 - Abnormal; Notable for the following:    Potassium 5.5 (*)     Creatinine, Ser 1.70 (*)     Glucose, Bld 114 (*)     All other components within normal limits  POCT I-STAT, CHEM 8 - Abnormal; Notable for the following:    Creatinine, Ser 1.80 (*)     Glucose, Bld 107 (*)     All other components within normal limits  URINE MICROSCOPIC-ADD ON   No results found.  Date: 08/02/2012  Rate: 115  Rhythm: normal sinus rhythm  QRS Axis: normal  Intervals: normal  ST/T Wave abnormalities: normal  Conduction Disutrbances: none  Narrative Interpretation: Sinus tachycardia  Old EKG Reviewed: No significant changes noted     1. Seizure       MDM  Patient has been seen previously by Dr. Gordy Clement at [facility]. Sister was going to make repeat  appointment this morning when seizure occurred.  He has just gotten medicare.  Patient is trying to get in at [facility] for primary care.  labs pending.  I am starting a keppra loading dose.  Patient still having tremors and I will give IV Ativan.  I stat 8 shows hyperkalemia.  I will repeat this lab to ro hemolysis. Patient with many pvc's on monitor, I have requested EKG.   ECG with sinus tach and probable old infarct. PVCs have resolved.  Patient labs shows small bump in creatinine.  WHite count likely due to acute phase rxn as there are no other signs of infection  Present.  Repeat I stat shows normal potassium.  Patient is back to baseline.    4:54 PM Filed Vitals:   07/26/12 1300 07/26/12 1330 07/26/12 1400 07/26/12 1500  BP: 101/75 135/82 110/84 135/76  Pulse: 110 102 94 95  Temp:      TempSrc:      Resp: 20 19 17 19   SpO2: 93% 93% 96% 98%   Patient feelin well. I spoke with Dr. Gordy Clement who asks to prescribe keppra and ativan.  I will refill patients pain medicines. Paitent will be seen this week in follow up at  [facility]. Discussed reasons to seek immediate care. Patient expresses understanding and agrees with plan.    keppra Ativan       Margarita Mail, PA-C 08/02/12 1427

## 2012-07-26 NOTE — ED Notes (Signed)
AIDET performed. 

## 2012-07-26 NOTE — ED Notes (Signed)
Given urinal to try and collect sample

## 2012-07-26 NOTE — ED Notes (Signed)
Patient arrived via EMS post seizure. Patient was at home when EMS arrived at 1150 to find him having a focal seizure which then turned into grand-maul seizure. Patient was given Versed 5mg  IM by EMS. Patient arrived postictal. CBG was 156.

## 2012-07-30 DIAGNOSIS — G8190 Hemiplegia, unspecified affecting unspecified side: Secondary | ICD-10-CM | POA: Diagnosis not present

## 2012-07-30 DIAGNOSIS — R569 Unspecified convulsions: Secondary | ICD-10-CM | POA: Diagnosis not present

## 2012-07-30 DIAGNOSIS — I6350 Cerebral infarction due to unspecified occlusion or stenosis of unspecified cerebral artery: Secondary | ICD-10-CM | POA: Diagnosis not present

## 2012-08-03 NOTE — ED Provider Notes (Signed)
Medical screening examination/treatment/procedure(s) were performed by non-physician practitioner and as supervising physician I was immediately available for consultation/collaboration.  Threasa Beards, MD 08/03/12 [phone]

## 2012-09-03 ENCOUNTER — Encounter: Payer: Self-pay | Admitting: Family Medicine

## 2012-09-03 DIAGNOSIS — G40909 Epilepsy, unspecified, not intractable, without status epilepticus: Secondary | ICD-10-CM | POA: Insufficient documentation

## 2012-09-25 ENCOUNTER — Ambulatory Visit (INDEPENDENT_AMBULATORY_CARE_PROVIDER_SITE_OTHER): Payer: Medicare Other | Admitting: Family Medicine

## 2012-09-25 ENCOUNTER — Encounter: Payer: Self-pay | Admitting: Family Medicine

## 2012-09-25 VITALS — BP 124/80 | Ht 68.0 in | Wt 166.0 lb

## 2012-09-25 DIAGNOSIS — F17200 Nicotine dependence, unspecified, uncomplicated: Secondary | ICD-10-CM

## 2012-09-25 DIAGNOSIS — M79609 Pain in unspecified limb: Secondary | ICD-10-CM

## 2012-09-25 DIAGNOSIS — F1010 Alcohol abuse, uncomplicated: Secondary | ICD-10-CM

## 2012-09-25 DIAGNOSIS — I10 Essential (primary) hypertension: Secondary | ICD-10-CM

## 2012-09-25 MED ORDER — CYCLOBENZAPRINE HCL 10 MG PO TABS: 10.0000 mg | ORAL_TABLET | Freq: Three times a day (TID) | ORAL | Status: DC

## 2012-09-25 MED ORDER — ESOMEPRAZOLE MAGNESIUM 40 MG PO CPDR: 40.0000 mg | DELAYED_RELEASE_CAPSULE | Freq: Every day | ORAL | Status: DC

## 2012-09-25 MED ORDER — CARVEDILOL 3.125 MG PO TABS: 3.1250 mg | ORAL_TABLET | Freq: Two times a day (BID) | ORAL | Status: DC

## 2012-09-25 MED ORDER — FOLIC ACID 1 MG PO TABS: 1.0000 mg | ORAL_TABLET | Freq: Every day | ORAL | Status: DC

## 2012-09-25 MED ORDER — OXYCODONE HCL 10 MG PO TB12: 10.0000 mg | ORAL_TABLET | Freq: Every day | ORAL | Status: DC

## 2012-09-25 NOTE — Assessment & Plan Note (Signed)
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  Pt reports this is s/p CVA in 2011.  Was on Oxycontin 10mg  qd, Flexeril 10 TID for pain.  Both meds refilled.  Pt aware I will only fill oxycontin this 1 time (#30 for 30 day supply given). Referral to pain clinic made.  If unable to be seen in timely manner, could consider adding neurontin to see if this helps.

## 2012-09-25 NOTE — Assessment & Plan Note (Signed)
Well controlled today on Coreg 3.125 BID. No changes.  Consider BMET at next visit as last Cr on file from 07/2012 was 1.80

## 2012-09-25 NOTE — Assessment & Plan Note (Signed)
Continues to drink 2-3 liquor shots per day.

## 2012-09-25 NOTE — Assessment & Plan Note (Signed)
Has cut down from 1ppd to 4 cig/day. Not interested in quitting at this time.

## 2012-09-25 NOTE — Progress Notes (Signed)
S: Pt comes in today for NP visit.  Patient with complex medical history since 2011 when he and his sister report that he died because of pain.  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  Since that time, he has had chronic pain in his R arm/hand.  He has also been having seizures since 2011-- currently on Keppra 1000 BID, with most recent seizure 4 months ago.  Last saw his neurologist, Dr. Krista Blue, 2-3 months ago.  Dr. Krista Blue is trying to coordinate botox injections for his R hand/arm.  He continues to smoke 4 cig/day and drink 2-3 liquor shots (usually brandy or vodka) per day. He no longer uses illicit drugs ("they made me stop after I was in the hospital"-- meaning in 2011)  Other issues he would like to discuss: Chronic Pain- Left hand and arm since stroke; + contractures     ROS: Per HPI  History  Smoking status  . Current Every Day Smoker -- 0.3 packs/day  Smokeless tobacco  . Former Systems developer  . Quit date: 05/26/2010    O:  Filed Vitals:   09/25/12 1520  BP: 124/80    Gen: NAD; mild expressive aphasia/stutter  CV: RRR, no murmur Pulm: CTA bilat, no wheezes or crackles Ext: Warm, +contracture of R hand with obvious muscular atrophy or hand and forearm, able to partially extend R fingers   A/P: 56 y.o. male p/w NP visit, complex PMHx -See problem list -f/u in 3-4 weeks for labs, BP and DM check

## 2012-09-25 NOTE — Patient Instructions (Signed)
It was nice to meet you today.  I am willing to refill your pain medicine for 1 month while we wait on the referral to the pain clinic.  After this 1 month supply, I will default all pain meds to the pain clinic.  For your seizures, I would recommend seeing Dr. Krista Blue again if you feel like you are going to have another seizure.  Come back to see me in a few weeks to have some blood work done and check on your diabetes and blood pressure.  We can discuss your hernia at that time as well.

## 2012-10-11 ENCOUNTER — Telehealth: Payer: Self-pay | Admitting: Family Medicine

## 2012-10-11 NOTE — Telephone Encounter (Signed)
Patient needs a refill on Lorazepam but this is a medication that the caller says needs prior authorization for as well.  [facility] [address]  He is completely out of this medication and he takes it everyday.

## 2012-10-11 NOTE — Telephone Encounter (Signed)
If able, please call in ativan 1mg  1 tab po TID PRN anxiety,  #15 to the pharmacy.  He does have a seizure disorder and if he actually takes it every day, I don't want to cause a seizure.  Let him know that he needs an appointment with me for further refills and to discuss the appropriateness of this medicine. Thanks!

## 2012-10-11 NOTE — Telephone Encounter (Signed)
[facility]  Medication should not need prior authorization.  Patient informed that he needs a follow-up appt with Dr. Loraine Maple.  Patient relies on his sister for transportation.  He'll check with her and call our office back to schedule an appt.  Nolene Ebbs, RN

## 2012-10-11 NOTE — Telephone Encounter (Signed)
Informed patient that Dr. Loraine Maple not in office today.  Patient informed to call pharmacy for future refills.  Will route refill request to Dr. Loraine Maple and may take 24 hours to approve refill/get prior authorization.  Patient verbalized understanding.  Nolene Ebbs, RN

## 2012-10-12 ENCOUNTER — Encounter: Payer: Self-pay | Admitting: Physical Medicine & Rehabilitation

## 2012-10-19 ENCOUNTER — Encounter: Payer: Medicare Other | Attending: Physical Medicine & Rehabilitation

## 2012-10-19 ENCOUNTER — Ambulatory Visit (HOSPITAL_BASED_OUTPATIENT_CLINIC_OR_DEPARTMENT_OTHER): Payer: Medicare Other | Admitting: Physical Medicine & Rehabilitation

## 2012-10-19 ENCOUNTER — Encounter: Payer: Self-pay | Admitting: Physical Medicine & Rehabilitation

## 2012-10-19 ENCOUNTER — Ambulatory Visit: Payer: Medicare Other | Admitting: Physical Medicine & Rehabilitation

## 2012-10-19 VITALS — BP 192/87 | HR 67 | Resp 14 | Ht 68.0 in | Wt 167.6 lb

## 2012-10-19 DIAGNOSIS — I69959 Hemiplegia and hemiparesis following unspecified cerebrovascular disease affecting unspecified side: Secondary | ICD-10-CM | POA: Diagnosis not present

## 2012-10-19 DIAGNOSIS — G8110 Spastic hemiplegia affecting unspecified side: Secondary | ICD-10-CM

## 2012-10-19 MED ORDER — TIZANIDINE HCL 4 MG PO TABS: 2.0000 mg | ORAL_TABLET | Freq: Three times a day (TID) | ORAL | Status: DC | PRN

## 2012-10-19 NOTE — Progress Notes (Signed)
Subjective:    Patient ID: Steven Frey, male    DOB: 05-27-1956, 57 y.o.   MRN: [id number]  HPI Right arm pain following a stroke approximately 2 years ago Did not remember the early recovery period after stroke. He remembers about 2 months after the stroke when he regained consciousness that the right arm was frozen stiff and painful Oxycodone and Lorazepam seems to help Shoulder is stiff Home health several visits after discharge from hospital Pain Inventory Average Pain 5 Pain Right Now 0 My pain is spasms  In the last 24 hours, has pain interfered with the following? General activity 6 Relation with others 2 Enjoyment of life 10 What TIME of day is your pain at its worst? varies Sleep (in general) Poor  Pain is worse with:  Pain improves with: medication Relief from Meds: 6  Mobility use a cane ability to climb steps?  yes do you drive?  yes Do you have any goals in this area?  no  Function not employed: date last employed 2009 disabled: date disabled 03/2010 I need assistance with the following:  dressing, meal prep and household duties  Neuro/Psych bowel control problems numbness tremor tingling trouble walking spasms dizziness confusion depression anxiety  Prior Studies Any changes since last visit?  no Clinical Data: 57 year old male with left occipital lobe infarct.  History of cardiac arrest.  MRI HEAD WITHOUT AND WITH CONTRAST  Technique: Multiplanar, multiecho pulse sequences of the brain and  surrounding structures were obtained according to standard protocol  without and with intravenous contrast  Contrast: 12 ml MultiHance.  Comparison: Head CT 03/26/2010.  Findings: Patchy restricted diffusion in the superior left  periRolandic cortex extending into the left centrum semiovale only.  Superimposed confluent left occipital cortical and white matter  restricted diffusion and patchy involvement throughout the left  parietal cortex and  subcortical white matter. T2 and FLAIR  hyperintensity in these areas. No mass effect or hemorrhage.  Diffusion elsewhere within normal limits.  No midline shift, ventriculomegaly, mass effect, evidence of mass  lesion, extra-axial collection or acute intracranial hemorrhage.  Cervicomedullary junction and pituitary are within normal limits.  Major intracranial vascular flow voids are preserved.  Outside of the acute findings described in the first section, Pearline Cables  and white matter signal is within normal limits for age throughout  the brain. Mild luxury perfusion related enhancement in the left  MCA and PCA territories. No other abnormal enhancement identified.  The patient is intubated. There is a fluid level in the  nasopharynx. Visualized paranasal sinuses and mastoids are clear.  Visualized orbits and scalp soft tissues are within normal limits.  Visualized bone marrow signal is within normal limits. Visualized  cervical spine is within normal limits.  IMPRESSION:  1. Subacute left PCA and posterior division left MCA infarcts. No  significant mass effect. No hemorrhage.  2. Otherwise negative MRI appearance of the brain for age.   Physicians involved in your care Any changes since last visit?  no   Family History  Problem Relation Age of Onset  . Coronary artery disease Neg Hx   . Colon cancer Neg Hx   . Alcohol abuse Mother   . Alcohol abuse Father   . Alcohol abuse    . Early death Mother   . Diabetes Sister   . Hyperlipidemia Sister   . Hypertension Sister   . Prostate cancer Father 38  . Breast cancer Paternal Grandmother    History   Social History  .  Marital Status: Legally Separated    Spouse Name: N/A    Number of Children: N/A  . Years of Education: N/A   Social History Main Topics  . Smoking status: Former Smoker -- 0.3 packs/day  . Smokeless tobacco: Former Systems developer    Quit date: 05/26/2010  . Alcohol Use: Yes     Comment: hx of alcohol abuse, reports  drinking 2-3 liquor drinks per day  . Drug Use: No     Comment: h/o THC, cocaine, heroin; no longer using since 2011  . Sexually Active: No   Other Topics Concern  . None   Social History Narrative   Divorced, 2 children. 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 Allred is his cardiologist, last seen 03/2012; sees 1x/year Dr. Krista Blue, [facility], is neurologist for seizures Single, unemployed Lives with his 56yo fatherCurrent smoker since 1966   Past Surgical History  Procedure Date  . Inguinal hernia repair     right  . Right wrist orif 1976    for fx  . Pancreatic stent placement    Past Medical History  Diagnosis Date  . Angioedema     rx requiring intubation/vent support suspected secondary to ACE1 (but also on zithromax) 5/09 hospitalization   . Smoker   . ETOH abuse   . Chronic pancreatitis     (10/08 [facility]). admx 02/2010 pseudocyst aspirated during admxn. pancreatic dual stent placement at [facility] 11/11  . 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   . Gastritis     alcohol induced  . Depression     no hx of meds  . VF (ventricular fibrillation) 2011    arrest 6/11. successfully rescucitated w/prolinged hospitalization due to respitatory failure. QT prolongation during cooling, now resolved  . History of stroke 2011    reports it was caused by pain from pancreatitis- caused heart to stop and stroke   . 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 Anxiety   . Seizures 2011  . Edema   . Abscess of liver   . Anoxic brain damage   . Anemia   . Hypertension 2011  . Arthritis   . Chronic pain 2011  . Diabetes mellitus 2013  . Hyperlipidemia 2011  . Inguinal hernia 2011   BP 192/87  Pulse 67  Resp 14  Ht 5\' 8"  (1.727 m)  Wt 167 lb 9.6 oz (76.023 kg)  BMI 25.48 kg/m2  SpO2 97%    Review of Systems    Musculoskeletal: Positive for myalgias, arthralgias and gait problem.  Neurological: Positive for dizziness, tremors and numbness.       Spasms, tingling  Psychiatric/Behavioral: Positive for confusion and dysphoric mood. The patient is nervous/anxious.   All other systems reviewed and are negative.       Objective:   Physical Exam  Constitutional: He is oriented to person, place, and time. He appears well-developed and well-nourished.  HENT:  Head: Normocephalic and atraumatic.  Eyes: Conjunctivae normal and EOM are normal. Pupils are equal, round, and reactive to light.  Musculoskeletal:       Right shoulder: He exhibits decreased range of motion, deformity and spasm. He exhibits no tenderness.       Right elbow: He exhibits decreased range of motion and deformity. no tenderness found.       Right wrist: He exhibits decreased range of motion and  deformity.       Right frozen shoulder with increased pectoralis muscle tone. Adductor deformity Right elbow Ashworth 2 flexor tone Right wrist Ashworth 3-4 tone flexor  Neurological: He is alert and oriented to person, place, and time. He displays atrophy and tremor. A sensory deficit is present. He exhibits abnormal muscle tone. He displays no seizure activity.       Increased pronator teres tone Increased intraosseous muscle tone Increased Right opponens pollicis tone Increased right palmaris longus tone Increased right flexor carpi radialis tone Increased right flexor digitorum sublimis tone Right fifth MCP hyperextended with PIP and DIPs hyperflexed Ashworth scores are 3 for all these muscle groups There is episodic tremor of right upper pole in this and right lumbricals/interosseous muscle  Psychiatric: He has a normal mood and affect.          Assessment & Plan:  1. 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 He has diffuse increase in muscle tone as well as intermittent tremor. This prolonged spasticity has  resulted in pain in the upper extremity. Recommend Zanaflex as an oral agent however I do not think this will be sufficient in reducing tone. Because his tone is very focal in the upper extremity would recommend botulinum toxin injection 50 units into the right pronator teres 50 units into the right flexor digitorum sublimis 50 units into the right flexor carpi radialis, 25 units into the right palmaris longus. If this dosage is not sufficient in 3 months we'll have to repeat with increased dose of 300 units. Discussed with patient  May need postinjection OT Recommend 12.5 units into interosseous muscles x3,12.5 units into the opponens pollicis

## 2012-10-19 NOTE — Patient Instructions (Signed)
I plan to inject Botox into the interossei muscle of the hand, thenar eminence of the hand, right pronator teres, right flexor digitorum sublimis, right flexor carpi radialis, right palmaris longus, 200 units total. If this is not adequate the next injection we may have to go up to 300 units  In the meantime we'll start Zanaflex 2 mg 3 times per day if you become too drowsy cut it down to twice a day

## 2012-10-26 ENCOUNTER — Telehealth: Payer: Self-pay

## 2012-10-26 ENCOUNTER — Other Ambulatory Visit: Payer: Self-pay | Admitting: Family Medicine

## 2012-10-26 ENCOUNTER — Telehealth: Payer: Self-pay | Admitting: Family Medicine

## 2012-10-26 MED ORDER — LORAZEPAM 1 MG PO TABS: ORAL_TABLET | ORAL | Status: DC

## 2012-10-26 MED ORDER — TIZANIDINE HCL 4 MG PO TABS: 2.0000 mg | ORAL_TABLET | Freq: Three times a day (TID) | ORAL | Status: DC | PRN

## 2012-10-26 NOTE — Telephone Encounter (Signed)
Called and inform. Faxed Rx. Javier Glazier, Gerrit Heck

## 2012-10-26 NOTE — Telephone Encounter (Signed)
Sister is calling because the pharmacy says that Dr. Loraine Maple has refused the refill on his Lorazepam until he sees her.  He is scheduled to come in next Tuesday so they would really like enough to last until [facility] [address].

## 2012-10-26 NOTE — Telephone Encounter (Signed)
He only had a lab appt.  Since he has MD appt, it is ok to fax in lorazepam 1mg  1 tab po TID PRN anxiety, #15 to the pharmacy.  In addition, he was given #15 on 1/2.  He should only be taking it PRN.  Will not fill over the phone again without an appointment.

## 2012-10-26 NOTE — Telephone Encounter (Signed)
[facility] and spoke with [facility]. Prescription was sent in on 10/19/12. [facility]. Prescription was verbally called into pharmacy. Notified patients sister of the above.

## 2012-10-26 NOTE — Telephone Encounter (Signed)
Will fwd. To PCP for review and ? Refill. Javier Glazier, Gerrit Heck

## 2012-10-26 NOTE — Telephone Encounter (Signed)
Patients sister called to follow up on zanaflex prescription he was supposed to get last week.  Please call.

## 2012-10-30 ENCOUNTER — Encounter: Payer: Self-pay | Admitting: Family Medicine

## 2012-10-30 ENCOUNTER — Ambulatory Visit (INDEPENDENT_AMBULATORY_CARE_PROVIDER_SITE_OTHER): Payer: Medicare Other | Admitting: Family Medicine

## 2012-10-30 ENCOUNTER — Other Ambulatory Visit: Payer: Medicare Other

## 2012-10-30 VITALS — BP 143/87 | HR 90 | Ht 68.0 in | Wt 166.0 lb

## 2012-10-30 DIAGNOSIS — M79609 Pain in unspecified limb: Secondary | ICD-10-CM

## 2012-10-30 DIAGNOSIS — K4090 Unilateral inguinal hernia, without obstruction or gangrene, not specified as recurrent: Secondary | ICD-10-CM | POA: Insufficient documentation

## 2012-10-30 DIAGNOSIS — I10 Essential (primary) hypertension: Secondary | ICD-10-CM | POA: Diagnosis not present

## 2012-10-30 DIAGNOSIS — R569 Unspecified convulsions: Secondary | ICD-10-CM | POA: Diagnosis not present

## 2012-10-30 DIAGNOSIS — E119 Type 2 diabetes mellitus without complications: Secondary | ICD-10-CM | POA: Diagnosis not present

## 2012-10-30 LAB — POCT GLYCOSYLATED HEMOGLOBIN (HGB A1C): Hemoglobin A1C: 5

## 2012-10-30 MED ORDER — TIZANIDINE HCL 4 MG PO TABS: 2.0000 mg | ORAL_TABLET | Freq: Three times a day (TID) | ORAL | Status: DC | PRN

## 2012-10-30 MED ORDER — VITAMIN B-12 1000 MCG PO TABS: 1000.0000 ug | ORAL_TABLET | Freq: Every day | ORAL | Status: DC

## 2012-10-30 MED ORDER — VITAMIN B-1 100 MG PO TABS: 100.0000 mg | ORAL_TABLET | Freq: Every day | ORAL | Status: DC

## 2012-10-30 MED ORDER — LORAZEPAM 1 MG PO TABS: ORAL_TABLET | ORAL | Status: DC

## 2012-10-30 NOTE — Assessment & Plan Note (Signed)
Refer to [facility] for likely mesh/surgical correction. Soft and reducible today

## 2012-10-30 NOTE — Assessment & Plan Note (Signed)
On keppra, uses ativan PRN if feels like he is going to have a seizure.  Sister will call this week to make appt with Dr. Krista Blue, his neurologist.

## 2012-10-30 NOTE — Patient Instructions (Signed)
It was good to see you today.  For your hernia, I am sending you to the surgeons.  Either Butch Penny from our office or someone from [facility] will call you with an appointment.  I think it is ok to stop the Carafate.  If you start having acid reflux/stomach pain, we will start a different PPI medicine since you have not been able to get the Nexium.  I am printing the Zanaflex Rx for Dr. Letta Pate since we have been having issues with you getting it.  He will continue to prescribe it in the future.  Your A1c was 5.0!  You can decrease your metformin to 500mg  2 times per day.   Come back to see me in 1-2 months and we will recheck your blood pressure and see how your stomach is doing.

## 2012-10-30 NOTE — Assessment & Plan Note (Signed)
Seeing Dr. Letta Pate, hopefully getting botox injection soon.  Refilled ativan-- used for both arm spasticity and seizure ppx.  Wrote script for Zanaflex as I could not print this Rx and pt and sister have been having difficulty getting it filled at pharmacy.  They are aware I am not primary Rx'er for his R arm pain at this time.

## 2012-10-30 NOTE — Assessment & Plan Note (Signed)
A1c 5.0.  Will decrease metformin from 1000 BID to 500 BID. Next A1c due 01/28/13.

## 2012-10-30 NOTE — Assessment & Plan Note (Signed)
Slightly elevated today, will f/u 1 month for recheck. Cont Coreg 3.125 BID. BMET at next visit.

## 2012-10-30 NOTE — Progress Notes (Signed)
S: Pt comes in today for SDA for med refill.  Patient needs refill on his ativan.  He uses this to help with this right arm pain.  Usually only needs it 1 time per day, but sometimes up to 2 times per day if he is hurting more-- arm gets to be "jumping around" and more spastic.  Also an indication that he is getting ready to have a seizure-- so takes it to help prevent seizures.  Does not have f/u with Dr. Krista Blue but sister [facility]) plans to make appt this week.  He is currently being seen by a pain clinic for his chronic pain and they are setting him up for botox injections.  Was also started on Zanaflex 2 mg 3 times per day on 10/20/11 by Dr. Letta Pate, but has been having difficulties with finding Rx at pharmacy.  Would appreciate it if I could print Rx today for them.     INGUINAL HERNIA Previously needed right inguinal hernia repaired.  Now with large left hernia.  Able to reduce it but it is getting bigger.  Has been an issue for 2-3 years.  Did have an episode where it stuck out and had to go to the ER to have it put back in-- was very painful.  Was previously supposed to be referred to surgeon but things didn't get done because [facility] closed.    DIABETES Home CBGs: doesn't check Meds: metformin 1000 BID Taking Meds: yes # of doses missed per week: 0 Hypoglycemic episodes?: no Symptoms: Polyuria: no Polydipsia: no Parasthesias: yes   Dizziness: no  Nausea:  no Vomiting:  no Last A1c:  Lab Results  Component Value Date   HGBA1C 5.0 10/30/2012     ROS: Per HPI  History  Smoking status  . Former Smoker -- 0.5 packs/day  Smokeless tobacco  . Former Systems developer  . Quit date: 05/26/2010    O:  Filed Vitals:   10/30/12 0925  BP: 143/87  Pulse: 90    Gen: NAD CV: RRR, no murmur Pulm: CTA bilat, no wheezes or crackles Ext: spastic tone of RUE with muscle atrophy  Groin: large L inguinal hernia, reducible but tender with reduction, does not extend into testicle    A/P: 57  y.o. male p/w HTN, DM, R arm spasticity, L inguinal hernia -See problem list -f/u in 1-2 months

## 2012-10-31 ENCOUNTER — Other Ambulatory Visit: Payer: Medicare Other

## 2012-11-01 ENCOUNTER — Encounter (INDEPENDENT_AMBULATORY_CARE_PROVIDER_SITE_OTHER): Payer: Self-pay | Admitting: General Surgery

## 2012-11-01 ENCOUNTER — Ambulatory Visit (INDEPENDENT_AMBULATORY_CARE_PROVIDER_SITE_OTHER): Payer: Medicare Other | Admitting: General Surgery

## 2012-11-01 VITALS — BP 118/88 | HR 76 | Temp 97.6°F | Resp 18 | Ht 68.0 in | Wt 166.8 lb

## 2012-11-01 DIAGNOSIS — K4090 Unilateral inguinal hernia, without obstruction or gangrene, not specified as recurrent: Secondary | ICD-10-CM

## 2012-11-01 NOTE — Progress Notes (Signed)
Patient ID: Steven Frey., male   DOB: [birth year], 57 y.o.   MRN: [id number]  Chief Complaint  Patient presents with  . New Evaluation    eval hernia    HPI [name]. is a 57 y.o. male.  He is referred to me for consideration of repair of a symptomatic left inguinal hernia. He is referred by Dr. Lorin Glass at [facility] family practice.  This patient has a complex past medical history with chronic pancreatitis in the setting of alcohol abuse, cirrhosis, possible hepatocellular carcinoma, seizure disorder, prior stroke, depression. He has undergone pancreatic ductal stenting at [facility]. He had an out of hospital VF arrest in June 2011 in the setting of heavy EtOH abuse and electrolyte depletion. He was resuscitated. He had QT prolongation during cooling that eventually resolved. He had prolonged hospitalization with anoxic brain injury. Cardiac catheterization June 2011 showed normal coronaries, EF [id number], grade 2 diastolic dysfunction. He refused to wear a life vest. Thought to be a poor ICD candidate. Saw Dr. Caryl Comes and Dr. Rayann Heman. Also has hypertension, diabetes, ongoing tobacco abuse.  Apparently was considering hernia repair one year ago. Saw cardiology but did not followup. In the interim has had to go to the emergency room department for forcible reduction of incarcerated hernia.  He is here today with his brother and is in no distress. HPI  Past Medical History  Diagnosis Date  . Angioedema     rx requiring intubation/vent support suspected secondary to ACE1 (but also on zithromax) 5/09 hospitalization   . Smoker   . ETOH abuse   . Chronic pancreatitis     (10/08 [facility]). admx 02/2010 pseudocyst aspirated during admxn. pancreatic dual stent placement at [facility] 11/11  . 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   . Gastritis     alcohol induced  . Depression     no hx of meds  . VF (ventricular fibrillation) 2011    arrest 6/11. successfully  rescucitated w/prolinged hospitalization due to respitatory failure. QT prolongation during cooling, now resolved  . History of stroke 2011    reports it was caused by pain from pancreatitis- caused heart to stop and stroke   . 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 Anxiety   . Seizures 2011  . Edema   . Abscess of liver   . Anoxic brain damage   . Anemia   . Hypertension 2011  . Arthritis   . Chronic pain 2011  . Diabetes mellitus 2013  . Hyperlipidemia 2011  . Inguinal hernia 2011  . COPD (chronic obstructive pulmonary disease)   . GERD (gastroesophageal reflux disease)   . Stroke     Past Surgical History  Procedure Date  . Right wrist orif 1976    for fx  . Pancreatic stent placement   . Inguinal hernia repair 1960    right    Family History  Problem Relation Age of Onset  . Coronary artery disease Neg Hx   . Colon cancer Neg Hx   . Alcohol abuse Mother   . Alcohol abuse Father   . Alcohol abuse    . Early death Mother   . Diabetes Sister   . Hyperlipidemia Sister   . Hypertension Sister   . Prostate cancer Father 29  . Breast cancer Paternal Grandmother     Social History History  Substance Use Topics  . Smoking status: Current Every Day Smoker --  0.5 packs/day    Types: Cigarettes  . Smokeless tobacco: Former Systems developer    Quit date: 05/26/2010  . Alcohol Use: Yes     Comment: hx of alcohol abuse, reports drinking 2-3 liquor drinks per day    Allergies  Allergen Reactions  . Azithromycin Swelling    Throat swelling, body swelling  . Lisinopril Other (See Comments)    REACTION: facial/neck edema    Current Outpatient Prescriptions  Medication Sig Dispense Refill  . carvedilol (COREG) 3.125 MG tablet Take 1 tablet (3.125 mg total) by mouth 2 (two) times daily with a meal.  60 tablet  5  . cyclobenzaprine (FLEXERIL) 10 MG tablet Take 1 tablet (10 mg total) by mouth 3 (three) times daily.  90 tablet  5  .  esomeprazole (NEXIUM) 40 MG capsule Take 1 capsule (40 mg total) by mouth daily.  30 capsule  11  . folic acid (FOLVITE) 1 MG tablet Take 1 tablet (1 mg total) by mouth daily.  30 tablet  11  . iron polysaccharides (NIFEREX) 150 MG capsule Take 150 mg by mouth 2 (two) times daily.       Marland Kitchen levETIRAcetam (KEPPRA) 1000 MG tablet Take 1 tablet (1,000 mg total) by mouth 2 (two) times daily.  60 tablet  0  . lipase/protease/amylase (CREON) 12000 UNITS CPEP Take 1 capsule by mouth daily.       Marland Kitchen LORazepam (ATIVAN) 1 MG tablet Up to TID, PRN only  90 tablet  0  . metFORMIN (GLUCOPHAGE) 1000 MG tablet Take 1 tablet (1,000 mg total) by mouth 2 (two) times daily with a meal.  60 tablet  0  . Multiple Vitamin (MULITIVITAMIN WITH MINERALS) TABS Take 1 tablet by mouth daily.      . nitroGLYCERIN (NITROSTAT) 0.4 MG SL tablet Place 0.4 mg under the tongue every 5 (five) minutes as needed. For chest pain      . ondansetron (ZOFRAN) 4 MG tablet Take 4 mg by mouth every 8 (eight) hours as needed. For nausea      . oxyCODONE (OXYCONTIN) 10 MG 12 hr tablet Take 1 tablet (10 mg total) by mouth daily.  30 tablet  0  . sucralfate (CARAFATE) 1 G tablet TAKE 1 TABLET BY MOUTH 3 TIMES A DAY  90 tablet  5  . thiamine (VITAMIN B-1) 100 MG tablet Take 1 tablet (100 mg total) by mouth daily.  30 tablet  11  . tiZANidine (ZANAFLEX) 4 MG tablet Take 0.5 tablets (2 mg total) by mouth every 8 (eight) hours as needed.  90 tablet  1  . vitamin B-12 (CYANOCOBALAMIN) 1000 MCG tablet Take 1 tablet (1,000 mcg total) by mouth daily.  30 tablet  11    Review of Systems Review of Systems  Constitutional: Negative for fever, chills and unexpected weight change.  HENT: Negative for hearing loss, congestion, sore throat, trouble swallowing and voice change.   Eyes: Negative for visual disturbance.  Respiratory: Negative for cough and wheezing.   Cardiovascular: Negative for chest pain, palpitations and leg swelling.  Gastrointestinal:  Negative for nausea, vomiting, abdominal pain, diarrhea, constipation, blood in stool, abdominal distention, anal bleeding and rectal pain.  Genitourinary: Positive for scrotal swelling. Negative for hematuria and difficulty urinating.  Musculoskeletal: Positive for myalgias and arthralgias.  Skin: Negative for rash and wound.  Neurological: Positive for seizures. Negative for syncope, weakness and headaches.  Hematological: Negative for adenopathy. Does not bruise/bleed easily.  Psychiatric/Behavioral: Positive for behavioral problems. Negative for  confusion. The patient is nervous/anxious.     Blood pressure 118/88, pulse 76, temperature 97.6 F (36.4 C), temperature source Temporal, resp. rate 18, height 5\' 8"  (1.727 m), weight 166 lb 12.8 oz (75.66 kg).  Physical Exam Physical Exam  Constitutional: He is oriented to person, place, and time. No distress.       Alert. Cooperative. Minimal speech impediment. Answers questions appropriately. Appears chronically ill.  HENT:  Head: Normocephalic.  Nose: Nose normal.  Mouth/Throat: No oropharyngeal exudate.  Eyes: Conjunctivae normal and EOM are normal. Pupils are equal, round, and reactive to light. Right eye exhibits no discharge. Left eye exhibits no discharge. No scleral icterus.  Neck: Normal range of motion. Neck supple. No JVD present. No tracheal deviation present. No thyromegaly present.  Cardiovascular: Normal rate, regular rhythm, normal heart sounds and intact distal pulses.   No murmur heard. Pulmonary/Chest: Effort normal and breath sounds normal. No stridor. No respiratory distress. He has no wheezes. He has no rales. He exhibits no tenderness.  Abdominal: Soft. Bowel sounds are normal. He exhibits no distension and no mass. There is no tenderness. There is no rebound and no guarding.  Genitourinary:       Large left inguinal hernia,  mostly reducible.no hernia on the right. No scrotal mass. Examined supine and standing.old  scar right groin.  Musculoskeletal: Normal range of motion. He exhibits no edema and no tenderness.       Multiple right hand contractures.  Lymphadenopathy:    He has no cervical adenopathy.  Neurological: He is alert and oriented to person, place, and time. He has normal reflexes. Coordination normal.       Mild right upper and right lower extremity weakness.  Skin: Skin is warm and dry. No rash noted. He is not diaphoretic. No erythema. No pallor.  Psychiatric: He has a normal mood and affect. His behavior is normal. Judgment and thought content normal.    Data Reviewed Family practice notes. Cardiology notes. ER records. CT scans.  Assessment    Left inguinal hernia with episodes of incarceration obstruction. elective repair is indicated to prevent a strangulation event.  History right inguinal hernia repair as a child, no evidence of recurrence on physical exam.  Will need cardiac clearance preop  Tobacco abuse  Seizure disorder  Hypertension  Diabetes mellitus type 2  Chronic arm pain, right  History chronic pancreatitis and alcohol abuse, status post PD duct stenting at [facility]  History of cirrhosis  History CVA with residual right side hemiplegia  Depression  History of VF arrest and anoxic brain injury, June 2011.    Plan    The patient will be referred back to Dr. Rayann Heman at [facility] for cardiac clearance.  Once we received cardiac clearance, he will be scheduled for repair of left inguinal hernia with mesh. I will plan to do this as an open procedure because of his cirrhosis. We will do this at [facility] hospital and observe him as an inpatient one night.  I discussed the indications, details, techniques, and numerous risk of the surgery with the patient and his brother. I have discussed this and given patient information booklet and reviewed all of this information. They understand all these issues. All their questions are answered. They agree with this  plan.       Edsel Petrin. Dalbert Batman, M.D., [facility] General and Minimally invasive Surgery Breast and Colorectal Surgery Office:   [phone] Pager:   [phone]  11/01/2012, 9:07 AM

## 2012-11-01 NOTE — Patient Instructions (Signed)
You had been advised to have an operation to repair your left inguinal hernia. The hernia is becoming dangerous because of the episode of incarceration and blockage that had to be pushed back in by the emergency department doctor.  We will arrange for you to see your cardiologist for preop cardiac clearance.  Once the cardiologist gives [place]  the go ahead approval, you'll be scheduled for the surgery to be done at [facility].     Inguinal Hernia, Adult Muscles help keep everything in the body in its proper place. But if a weak spot in the muscles develops, something can poke through. That is called a hernia. When this happens in the lower part of the belly (abdomen), it is called an inguinal hernia. (It takes its name from a part of the body in this region called the inguinal canal.) A weak spot in the wall of muscles lets some fat or part of the small intestine bulge through. An inguinal hernia can develop at any age. Men get them more often than women. CAUSES  In adults, an inguinal hernia develops over time.  It can be triggered by:  Suddenly straining the muscles of the lower abdomen.  Lifting heavy objects.  Straining to have a bowel movement. Difficult bowel movements (constipation) can lead to this.  Constant coughing. This may be caused by smoking or lung disease.  Being overweight.  Being pregnant.  Working at a job that requires long periods of standing or heavy lifting.  Having had an inguinal hernia before. One type can be an emergency situation. It is called a strangulated inguinal hernia. It develops if part of the small intestine slips through the weak spot and cannot get back into the abdomen. The blood supply can be cut off. If that happens, part of the intestine may die. This situation requires emergency surgery. SYMPTOMS  Often, a small inguinal hernia has no symptoms. It is found when a healthcare provider does a physical exam. Larger hernias usually have  symptoms.   In adults, symptoms may include:  A lump in the groin. This is easier to see when the person is standing. It might disappear when lying down.  In men, a lump in the scrotum.  Pain or burning in the groin. This occurs especially when lifting, straining or coughing.  A dull ache or feeling of pressure in the groin.  Signs of a strangulated hernia can include:  A bulge in the groin that becomes very painful and tender to the touch.  A bulge that turns red or purple.  Fever, nausea and vomiting.  Inability to have a bowel movement or to pass gas. DIAGNOSIS  To decide if you have an inguinal hernia, a healthcare provider will probably do a physical examination.  This will include asking questions about any symptoms you have noticed.  The healthcare provider might feel the groin area and ask you to cough. If an inguinal hernia is felt, the healthcare provider may try to slide it back into the abdomen.  Usually no other tests are needed. TREATMENT  Treatments can vary. The size of the hernia makes a difference. Options include:  Watchful waiting. This is often suggested if the hernia is small and you have had no symptoms.  No medical procedure will be done unless symptoms develop.  You will need to watch closely for symptoms. If any occur, contact your healthcare provider right away.  Surgery. This is used if the hernia is larger or you have symptoms.  Open surgery. This is usually an outpatient procedure (you will not stay overnight in a hospital). An cut (incision) is made through the skin in the groin. The hernia is put back inside the abdomen. The weak area in the muscles is then repaired by herniorrhaphy or hernioplasty. Herniorrhaphy: in this type of surgery, the weak muscles are sewn back together. Hernioplasty: a patch or mesh is used to close the weak area in the abdominal wall.  Laparoscopy. In this procedure, a surgeon makes small incisions. A thin tube with  a tiny video camera (called a laparoscope) is put into the abdomen. The surgeon repairs the hernia with mesh by looking with the video camera and using two long instruments. HOME CARE INSTRUCTIONS   After surgery to repair an inguinal hernia:  You will need to take pain medicine prescribed by your healthcare provider. Follow all directions carefully.  You will need to take care of the wound from the incision.  Your activity will be restricted for awhile. This will probably include no heavy lifting for several weeks. You also should not do anything too active for a few weeks. When you can return to work will depend on the type of job that you have.  During "watchful waiting" periods, you should:  Maintain a healthy weight.  Eat a diet high in fiber (fruits, vegetables and whole grains).  Drink plenty of fluids to avoid constipation. This means drinking enough water and other liquids to keep your urine clear or pale yellow.  Do not lift heavy objects.  Do not stand for long periods of time.  Quit smoking. This should keep you from developing a frequent cough. SEEK MEDICAL CARE IF:   A bulge develops in your groin area.  You feel pain, a burning sensation or pressure in the groin. This might be worse if you are lifting or straining.  You develop a fever of more than 100.5 F (38.1 C). SEEK IMMEDIATE MEDICAL CARE IF:   Pain in the groin increases suddenly.  A bulge in the groin gets bigger suddenly and does not go down.  For men, there is sudden pain in the scrotum. Or, the size of the scrotum increases.  A bulge in the groin area becomes red or purple and is painful to touch.  You have nausea or vomiting that does not go away.  You feel your heart beating much faster than normal.  You cannot have a bowel movement or pass gas.  You develop a fever of more than 102.0 F (38.9 C). Document Released: 02/12/2009 Document Revised: 12/19/2011 Document Reviewed:  02/12/2009 [facility] Patient Information 2013 [place].

## 2012-11-01 NOTE — Progress Notes (Signed)
Faxed request for cardiac clearance to [facility] attn: Dr. Rayann Heman. Confirmation received. Patient to be scheduled for [facility] repair with mesh once clearance has been received.

## 2012-11-05 ENCOUNTER — Telehealth (INDEPENDENT_AMBULATORY_CARE_PROVIDER_SITE_OTHER): Payer: Self-pay | Admitting: General Surgery

## 2012-11-05 NOTE — Telephone Encounter (Signed)
FYI [facility] called concerning this pt. He has not been seen in over a year, so they will call and make an appt for him to be seen by a cardiologist. Corrin Parker

## 2012-11-12 ENCOUNTER — Ambulatory Visit: Payer: Medicare Other | Admitting: Physician Assistant

## 2012-11-29 ENCOUNTER — Other Ambulatory Visit: Payer: Self-pay | Admitting: Family Medicine

## 2012-12-03 ENCOUNTER — Encounter: Payer: Medicare Other | Attending: Physical Medicine & Rehabilitation

## 2012-12-03 ENCOUNTER — Ambulatory Visit (HOSPITAL_BASED_OUTPATIENT_CLINIC_OR_DEPARTMENT_OTHER): Payer: Medicare Other | Admitting: Physical Medicine & Rehabilitation

## 2012-12-03 ENCOUNTER — Encounter: Payer: Self-pay | Admitting: Physical Medicine & Rehabilitation

## 2012-12-03 VITALS — BP 135/87 | HR 83 | Resp 14 | Ht 68.0 in | Wt 167.0 lb

## 2012-12-03 DIAGNOSIS — I69959 Hemiplegia and hemiparesis following unspecified cerebrovascular disease affecting unspecified side: Secondary | ICD-10-CM | POA: Insufficient documentation

## 2012-12-03 DIAGNOSIS — G8110 Spastic hemiplegia affecting unspecified side: Secondary | ICD-10-CM

## 2012-12-03 NOTE — Patient Instructions (Signed)
Continue Zanaflex See me in 4 weeks

## 2012-12-03 NOTE — Progress Notes (Signed)
Botox injection Under EMG guidance Indication spastic hemiplegia with dystonia Tone is only partial the response to medication management and other treatments Re: MS form complete Informed consent was obtained after describing risks and benefits of the procedure with the patient his include bleeding bruising and infection he elects perceived and has given written consent Patient placed in a seated position area is marked and prepped with Betadine alcohol entered with a [id number] 25 cm silicone-coated needle electrode under EMG guidance 25 units were injected into the right adductor pollicis, right ADM, [id number] into the interosseous muscles x3, 50 units into the right extensor indices , 12.5 units right opponens pollicis, 25 units right pronator teres, 25 units right FDS, 50 units right FCR, 25 units right FCU and FDP Patient tolerated procedure well Post procedure instructions given Followup in one month Continue oral anti-spasticity agent

## 2012-12-25 ENCOUNTER — Other Ambulatory Visit: Payer: Self-pay | Admitting: *Deleted

## 2012-12-25 MED ORDER — CARVEDILOL 3.125 MG PO TABS: 3.1250 mg | ORAL_TABLET | Freq: Two times a day (BID) | ORAL | Status: DC

## 2012-12-25 MED ORDER — VITAMIN B-1 100 MG PO TABS: 100.0000 mg | ORAL_TABLET | Freq: Every day | ORAL | Status: DC

## 2012-12-25 MED ORDER — FOLIC ACID 1 MG PO TABS: 1.0000 mg | ORAL_TABLET | Freq: Every day | ORAL | Status: DC

## 2012-12-25 MED ORDER — VITAMIN B-12 1000 MCG PO TABS: 1000.0000 ug | ORAL_TABLET | Freq: Every day | ORAL | Status: DC

## 2012-12-25 NOTE — Telephone Encounter (Signed)
Patient requesting a 90 day supply.  Steven Frey

## 2012-12-31 ENCOUNTER — Encounter: Payer: Medicare Other | Attending: Physical Medicine & Rehabilitation

## 2012-12-31 ENCOUNTER — Encounter: Payer: Self-pay | Admitting: Physical Medicine & Rehabilitation

## 2012-12-31 ENCOUNTER — Ambulatory Visit (HOSPITAL_BASED_OUTPATIENT_CLINIC_OR_DEPARTMENT_OTHER): Payer: Medicare Other | Admitting: Physical Medicine & Rehabilitation

## 2012-12-31 VITALS — BP 124/81 | HR 73 | Resp 14 | Ht 68.0 in | Wt 170.6 lb

## 2012-12-31 DIAGNOSIS — I69959 Hemiplegia and hemiparesis following unspecified cerebrovascular disease affecting unspecified side: Secondary | ICD-10-CM | POA: Diagnosis not present

## 2012-12-31 DIAGNOSIS — G8110 Spastic hemiplegia affecting unspecified side: Secondary | ICD-10-CM

## 2012-12-31 MED ORDER — TIZANIDINE HCL 4 MG PO TABS: 2.0000 mg | ORAL_TABLET | Freq: Three times a day (TID) | ORAL | Status: DC | PRN

## 2012-12-31 NOTE — Patient Instructions (Signed)
We'll repeat Botox at higher dose next visit in 2 months Continue Zanaflex

## 2012-12-31 NOTE — Progress Notes (Signed)
Subjective:    Patient ID: Steven Frey., male    DOB: 08/07/1956, 57 y.o.   MRN: [id number]  HPI Botox 12/03/2012 25 units were injected into the right adductor pollicis, right ADM, [id number] into the interosseous muscles x3, 50 units into the right extensor indices , 12.5 units right opponens pollicis, 25 units right pronator teres, 25 units right FDS, 50 units right FCR, 25 units right FCU and FDP    Pain Inventory Average Pain 2 Pain Right Now 2 My pain is dull  In the last 24 hours, has pain interfered with the following? General activity 0 Relation with others 5 Enjoyment of life 0 What TIME of day is your pain at its worst? daytime Sleep (in general) Fair  Pain is worse with: inactivity Pain improves with: medication Relief from Meds: no pain medication  Mobility use a cane ability to climb steps?  yes do you drive?  no  Function disabled: date disabled see chart  Neuro/Psych numbness trouble walking spasms confusion  Prior Studies Any changes since last visit?  no  Physicians involved in your care Any changes since last visit?  no   Family History  Problem Relation Age of Onset  . Coronary artery disease Neg Hx   . Colon cancer Neg Hx   . Alcohol abuse Mother   . Alcohol abuse Father   . Alcohol abuse    . Early death Mother   . Diabetes Sister   . Hyperlipidemia Sister   . Hypertension Sister   . Prostate cancer Father 50  . Breast cancer Paternal Grandmother    History   Social History  . Marital Status: Legally Separated    Spouse Name: N/A    Number of Children: N/A  . Years of Education: N/A   Social History Main Topics  . Smoking status: Current Every Day Smoker -- 0.50 packs/day    Types: Cigarettes  . Smokeless tobacco: Former Systems developer    Quit date: 05/26/2010  . Alcohol Use: Yes     Comment: hx of alcohol abuse, reports drinking 2-3 liquor drinks per day  . Drug Use: No     Comment: h/o THC, cocaine, heroin; no longer using since  2011  . Sexually Active: No   Other Topics Concern  . None   Social History Narrative   Divorced, 2 children. 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 Rayann Heman is his cardiologist, last seen 03/2012; sees 1x/year    Dr. Krista Blue, [facility], is neurologist for seizures       Single, unemployed    Lives with his 23yo father   Current smoker since 1966   Past Surgical History  Procedure Laterality Date  . Right wrist orif  1976    for fx  . Pancreatic stent placement    . Inguinal hernia repair  1960    right   Past Medical History  Diagnosis Date  . Angioedema     rx requiring intubation/vent support suspected secondary to ACE1 (but also on zithromax) 5/09 hospitalization   . Smoker   . ETOH abuse   . Chronic pancreatitis     (10/08 [facility]). admx 02/2010 pseudocyst aspirated during admxn. pancreatic dual stent placement at [facility] 11/11  . 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   Marland Kitchen  Gastritis     alcohol induced  . Depression     no hx of meds  . VF (ventricular fibrillation) 2011    arrest 6/11. successfully rescucitated w/prolinged hospitalization due to respitatory failure. QT prolongation during cooling, now resolved  . History of stroke 2011    reports it was caused by pain from pancreatitis- caused heart to stop and stroke   . 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 Anxiety   . Seizures 2011  . Edema   . Abscess of liver   . Anoxic brain damage   . Anemia   . Hypertension 2011  . Arthritis   . Chronic pain 2011  . Diabetes mellitus 2013  . Hyperlipidemia 2011  . Inguinal hernia 2011  . COPD (chronic obstructive pulmonary disease)   . GERD (gastroesophageal reflux disease)   . Stroke    BP 124/81  Pulse 73  Resp 14  Ht 5\' 8"  (1.727 m)  Wt 170 lb 9.6 oz (77.384 kg)  BMI 25.95 kg/m2   SpO2 97%    Review of Systems  Musculoskeletal: Positive for gait problem.       Spasms  Neurological: Positive for numbness.  Psychiatric/Behavioral: Positive for confusion.  All other systems reviewed and are negative.       Objective:   Physical Exam  Image taken stored under media file [place] grade 3/4 spasticity in the right pronator, right wrist flexor Swan-neck deformity right middle finger, right fifth digit hyperextended at MCP hyperflexed at PIP and DIPs Right thumb hyperextended at PIP Right index finger hyperextended at PIP      Assessment & Plan:  1. Right spastic hemiplegia. He's had improvement in pain after Botox injection however deformities have not changed much. He had 300 units injected. Will increase dose to 400 units. Also will concentrate more the medication into the pronator teres and wrist flexor muscle groups  Plan for Botox,  50  units right pronator teres 50 units right FCU 50 units right FCR 50 units right EDC 50 units right EIP 25 units right adductor pollicis 25 units right ADM 50 units right medial FDP 12.5 units interosseous x3 12.5 units opponens pollicis  Continue Zanaflex

## 2013-01-25 ENCOUNTER — Encounter: Payer: Self-pay | Admitting: Physician Assistant

## 2013-02-22 ENCOUNTER — Other Ambulatory Visit: Payer: Self-pay | Admitting: *Deleted

## 2013-02-25 ENCOUNTER — Encounter: Payer: Medicare Other | Attending: Physical Medicine & Rehabilitation

## 2013-02-25 ENCOUNTER — Other Ambulatory Visit: Payer: Self-pay | Admitting: *Deleted

## 2013-02-25 ENCOUNTER — Encounter: Payer: Self-pay | Admitting: Physical Medicine & Rehabilitation

## 2013-02-25 ENCOUNTER — Ambulatory Visit (HOSPITAL_BASED_OUTPATIENT_CLINIC_OR_DEPARTMENT_OTHER): Payer: Medicare Other | Admitting: Physical Medicine & Rehabilitation

## 2013-02-25 VITALS — BP 131/77 | HR 75 | Resp 14 | Ht 68.0 in | Wt 162.8 lb

## 2013-02-25 DIAGNOSIS — I69959 Hemiplegia and hemiparesis following unspecified cerebrovascular disease affecting unspecified side: Secondary | ICD-10-CM | POA: Diagnosis not present

## 2013-02-25 DIAGNOSIS — G8110 Spastic hemiplegia affecting unspecified side: Secondary | ICD-10-CM | POA: Diagnosis not present

## 2013-02-25 NOTE — Progress Notes (Signed)
Subjective:    Patient ID: Steven Read., male    DOB: September 28, 1956, 57 y.o.   MRN: [id number]  HPI Botox 12/03/2012 25 units were injected into the right adductor pollicis, right ADM, [id number] into the interosseous muscles x3, 50 units into the right extensor indices , 12.5 units right opponens pollicis, 25 units right pronator teres, 25 units right FDS, 50 units right FCR, 25 units right FCU and FDP    Pain Inventory Average Pain 2 Pain Right Now 2 My pain is dull  In the last 24 hours, has pain interfered with the following? General activity 0 Relation with others 5 Enjoyment of life 0 What TIME of day is your pain at its worst? daytime Sleep (in general) Fair  Pain is worse with: inactivity Pain improves with: medication Relief from Meds: no pain medication  Mobility use a cane ability to climb steps?  yes do you drive?  no  Function disabled: date disabled see chart  Neuro/Psych numbness trouble walking spasms confusion  Prior Studies Any changes since last visit?  no  Physicians involved in your care Any changes since last visit?  no   Family History  Problem Relation Age of Onset  . Coronary artery disease Neg Hx   . Colon cancer Neg Hx   . Alcohol abuse Mother   . Alcohol abuse Father   . Alcohol abuse    . Early death Mother   . Diabetes Sister   . Hyperlipidemia Sister   . Hypertension Sister   . Prostate cancer Father 59  . Breast cancer Paternal Grandmother    History   Social History  . Marital Status: Legally Separated    Spouse Name: N/A    Number of Children: N/A  . Years of Education: N/A   Social History Main Topics  . Smoking status: Current Every Day Smoker -- 0.50 packs/day    Types: Cigarettes  . Smokeless tobacco: Former Systems developer    Quit date: 05/26/2010  . Alcohol Use: Yes     Comment: hx of alcohol abuse, reports drinking 2-3 liquor drinks per day  . Drug Use: No     Comment: h/o THC, cocaine, heroin; no longer using since  2011  . Sexually Active: No   Other Topics Concern  . None   Social History Narrative   Divorced, 2 children. 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 Rayann Heman is his cardiologist, last seen 03/2012; sees 1x/year    Dr. Krista Blue, [facility], is neurologist for seizures       Single, unemployed    Lives with his 55yo father   Current smoker since 1966   Past Surgical History  Procedure Laterality Date  . Right wrist orif  1976    for fx  . Pancreatic stent placement    . Inguinal hernia repair  1960    right   Past Medical History  Diagnosis Date  . Angioedema     rx requiring intubation/vent support suspected secondary to ACE1 (but also on zithromax) 5/09 hospitalization   . Smoker   . ETOH abuse   . Chronic pancreatitis     (10/08 hops). admx 02/2010 pseudocyst aspirated during admxn. pancreatic dual stent placement at [facility] 11/11  . 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   Marland Kitchen  Gastritis     alcohol induced  . Depression     no hx of meds  . VF (ventricular fibrillation) 2011    arrest 6/11. successfully rescucitated w/prolinged hospitalization due to respitatory failure. QT prolongation during cooling, now resolved  . History of stroke 2011    reports it was caused by pain from pancreatitis- caused heart to stop and stroke   . 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 Anxiety   . Seizures 2011  . Edema   . Abscess of liver   . Anoxic brain damage   . Anemia   . Hypertension 2011  . Arthritis   . Chronic pain 2011  . Diabetes mellitus 2013  . Hyperlipidemia 2011  . Inguinal hernia 2011  . COPD (chronic obstructive pulmonary disease)   . GERD (gastroesophageal reflux disease)   . Stroke    BP 131/77  Pulse 75  Resp 14  Ht 5\' 8"  (1.727 m)  Wt 162 lb 12.8 oz (73.846 kg)  BMI 24.76 kg/m2   SpO2 99%    Review of Systems  Musculoskeletal: Positive for gait problem.       Spasms  Neurological: Positive for numbness.  Psychiatric/Behavioral: Positive for confusion.  All other systems reviewed and are negative.       Objective:   Physical Exam  Image taken stored under media file Ashworth grade 3/4 spasticity in the right pronator, right wrist flexor Swan-neck deformity right middle finger, right fifth digit hyperextended at MCP hyperflexed at PIP and DIPs Right thumb hyperextended at PIP Right index finger hyperextended at PIP      Assessment & Plan:  1. Right spastic hemiplegia. He's had improvement in pain after Botox injection however deformities have not changed much. He had 300 units injected. Will increase dose to 400 units. Also will concentrate more the medication into the pronator teres and wrist flexor muscle groups  Botox injection under estim and EMG guidance 50unit/ml dilution REMS form on file Informed consent obtained  50  units right pronator teres 50 units right FCU 50 units right FCR 75 units right EDC 50 units right EIP 25 units right adductor pollicis 25 units right ADM 50 units right medial FDP 12.5 units interosseous x3 12.5 units opponens pollicis  Continue Zanaflex Photo taken in media

## 2013-02-28 ENCOUNTER — Other Ambulatory Visit: Payer: Self-pay | Admitting: Family Medicine

## 2013-02-28 MED ORDER — LORAZEPAM 1 MG PO TABS: ORAL_TABLET | ORAL | Status: DC

## 2013-03-28 ENCOUNTER — Encounter: Payer: Self-pay | Admitting: Internal Medicine

## 2013-03-28 ENCOUNTER — Ambulatory Visit (INDEPENDENT_AMBULATORY_CARE_PROVIDER_SITE_OTHER): Payer: Medicare Other | Admitting: Internal Medicine

## 2013-03-28 VITALS — BP 151/87 | HR 82 | Ht 68.0 in | Wt 162.4 lb

## 2013-03-28 DIAGNOSIS — I10 Essential (primary) hypertension: Secondary | ICD-10-CM

## 2013-03-28 DIAGNOSIS — I469 Cardiac arrest, cause unspecified: Secondary | ICD-10-CM

## 2013-03-28 DIAGNOSIS — R0602 Shortness of breath: Secondary | ICD-10-CM

## 2013-03-28 DIAGNOSIS — F1010 Alcohol abuse, uncomplicated: Secondary | ICD-10-CM

## 2013-03-28 DIAGNOSIS — Z01810 Encounter for preprocedural cardiovascular examination: Secondary | ICD-10-CM | POA: Insufficient documentation

## 2013-03-28 NOTE — Progress Notes (Signed)
PCP:  MCGILL,JACQUELYN, MD  The patient presents today for cardiology preoperative evaluation.  Since last being seen in our clinic, the patient reports doing very well.  He has chronic difficulty related to prior stroke and is not very active.  He reports occasional SOB and CP with exertion which are short lived.  Today, he denies symptoms of palpitations, orthopnea, PND, lower extremity edema, dizziness, presyncope, syncope, or neurologic sequela.  The patient feels that he is tolerating medications without difficulties and is otherwise without complaint today. He is planned for inguinal hernia repair soon and presents today for evaluation.  Past Medical History  Diagnosis Date  . Angioedema     rx requiring intubation/vent support suspected secondary to ACE1 (but also on zithromax) 5/09 hospitalization   . Smoker   . ETOH abuse   . Chronic pancreatitis     (10/08 hops). admx 02/2010 pseudocyst aspirated during admxn. pancreatic dual stent placement at [facility] 11/11  . 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   . Gastritis     alcohol induced  . Depression     no hx of meds  . VF (ventricular fibrillation) 2011    arrest 6/11. successfully rescucitated w/prolinged hospitalization due to respitatory failure. QT prolongation during cooling, now resolved  . History of stroke 2011    reports it was caused by pain from pancreatitis- caused heart to stop and stroke   . 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 Anxiety   . Seizures 2011  . Edema   . 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 Anoxic brain damage   . Anemia   . Hypertension 2011  . Arthritis   . Chronic pain 2011  . Diabetes mellitus 2013  . Hyperlipidemia 2011  . Inguinal hernia 2011  . COPD (chronic obstructive pulmonary disease)   . GERD (gastroesophageal reflux disease)   . Stroke    Past Surgical History  Procedure Laterality Date  . Right wrist orif   1976    for fx  . Pancreatic stent placement    . Inguinal hernia repair  1960    right    Current Outpatient Prescriptions  Medication Sig Dispense Refill  . carvedilol (COREG) 3.125 MG tablet Take 1 tablet (3.125 mg total) by mouth 2 (two) times daily with a meal.  180 tablet  0  . CREON 12000 UNITS CPEP TAKE ONE CAPSULE BY MOUTH EVERY DAY  30 capsule  5  . cyclobenzaprine (FLEXERIL) 10 MG tablet Take 1 tablet (10 mg total) by mouth 3 (three) times daily.  90 tablet  5  . esomeprazole (NEXIUM) 40 MG capsule Take 1 capsule (40 mg total) by mouth daily.  30 capsule  11  . folic acid (FOLVITE) 1 MG tablet Take 1 tablet (1 mg total) by mouth daily.  90 tablet  3  . levETIRAcetam (KEPPRA) 1000 MG tablet Take 1 tablet (1,000 mg total) by mouth 2 (two) times daily.  60 tablet  0  . LORazepam (ATIVAN) 1 MG tablet Up to TID, PRN only.  Needs follow up with PCP for further refills  21 tablet  0  . metFORMIN (GLUCOPHAGE) 1000 MG tablet Take 1 tablet (1,000 mg total) by mouth 2 (two) times daily with a meal.  60 tablet  0  . Multiple Vitamin (MULITIVITAMIN WITH MINERALS) TABS Take 1 tablet by mouth daily.      . nitroGLYCERIN (NITROSTAT) 0.4  MG SL tablet Place 0.4 mg under the tongue every 5 (five) minutes as needed. For chest pain      . sucralfate (CARAFATE) 1 G tablet TAKE 1 TABLET BY MOUTH 3 TIMES A DAY  90 tablet  5  . thiamine (VITAMIN B-1) 100 MG tablet Take 1 tablet (100 mg total) by mouth daily.  90 tablet  3  . tiZANidine (ZANAFLEX) 4 MG tablet Take 0.5 tablets (2 mg total) by mouth every 8 (eight) hours as needed.  90 tablet  1  . vitamin B-12 (CYANOCOBALAMIN) 1000 MCG tablet Take 1 tablet (1,000 mcg total) by mouth daily.  90 tablet  3   No current facility-administered medications for this visit.    Allergies  Allergen Reactions  . Azithromycin Swelling    Throat swelling, body swelling  . Lisinopril Other (See Comments)    REACTION: facial/neck edema    History   Social  History  . Marital Status: Legally Separated    Spouse Name: N/A    Number of Children: N/A  . Years of Education: N/A   Occupational History  . Not on file.   Social History Main Topics  . Smoking status: Current Every Day Smoker -- 0.50 packs/day    Types: Cigarettes  . Smokeless tobacco: Former Systems developer    Quit date: 05/26/2010  . Alcohol Use: Yes     Comment: hx of alcohol abuse, reports drinking 2-3 liquor drinks per day  . Drug Use: No     Comment: h/o THC, cocaine, heroin; no longer using since 2011  . Sexually Active: No   Other Topics Concern  . Not on file   Social History Narrative   Divorced, 2 children. 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 Rayann Heman is his cardiologist, last seen 03/2012; sees 1x/year    Dr. Krista Blue, [facility], is neurologist for seizures       Single, unemployed    Lives with his 33yo father   Current smoker since 1966    Family History  Problem Relation Age of Onset  . Coronary artery disease Neg Hx   . Colon cancer Neg Hx   . Alcohol abuse Mother   . Alcohol abuse Father   . Alcohol abuse    . Early death Mother   . Diabetes Sister   . Hyperlipidemia Sister   . Hypertension Sister   . Prostate cancer Father 23  . Breast cancer Paternal Grandmother     ROS-  All systems are reviewed and are negative except as outlined in the HPI above  Physical Exam: Filed Vitals:   03/28/13 1404  BP: 151/87  Pulse: 82  Height: 5\' 8"  (1.727 m)  Weight: 162 lb 6.4 oz (73.664 kg)    GEN- The patient is chronically appearing, alert and oriented x 3 today.   Head- normocephalic, atraumatic Eyes-  Sclera clear, conjunctiva pink Ears- hearing intact Oropharynx- clear Neck- supple  Lungs- Clear to ausculation bilaterally, normal work of breathing Heart- Regular rate and rhythm, no murmurs, rubs or gallops, PMI not laterally displaced GI-  soft, NT, ND, + BS Extremities- no clubbing, cyanosis, or edema MS- significant R arm and leg atrophy with chronic spasticity Skin- no rash or lesion Psych- euthymic mood, full affect Neuro- as above  ekg today reveals sinus rhythm 82 bpm, inferior infarction, Qtc 448  Assessment and Plan:  1. Preop assessment He has multiple CAD risk factors including tobacco, HTN, and DM.  He is not functionally very active.  He has occasional SOB and CP.  I would therefore recommend further CV risk stratification prior to surgery.  I have ordered a lexiscan myoview.  If low risk then he should proceed with surgery with standard precautions.  If his Brantley Fling is significantly abnormal then further CV testing may be required.  2. Prior Cardiac arrest In the setting of malnutrition, ETOH, and metabolic derangements.   ETOH cessation long term  3. HTN Stable No change required today  4. Tobacco Cessation strongly advised He is not ready to quit  Return as needed

## 2013-03-28 NOTE — Patient Instructions (Signed)
Your physician has requested that you have a lexiscan myoview. For further information please visit [email or web address]. Please follow instruction sheet, as given.  We will see you as needed depending on the result of your myoview.

## 2013-04-02 ENCOUNTER — Encounter: Payer: Medicare Other | Attending: Physical Medicine & Rehabilitation

## 2013-04-02 ENCOUNTER — Other Ambulatory Visit: Payer: Self-pay | Admitting: *Deleted

## 2013-04-02 ENCOUNTER — Ambulatory Visit (HOSPITAL_BASED_OUTPATIENT_CLINIC_OR_DEPARTMENT_OTHER): Payer: Medicare Other | Admitting: Physical Medicine & Rehabilitation

## 2013-04-02 ENCOUNTER — Telehealth: Payer: Self-pay | Admitting: Family Medicine

## 2013-04-02 ENCOUNTER — Encounter: Payer: Self-pay | Admitting: Physical Medicine & Rehabilitation

## 2013-04-02 VITALS — BP 148/80 | HR 78 | Resp 14 | Ht 68.0 in | Wt 166.0 lb

## 2013-04-02 DIAGNOSIS — G8110 Spastic hemiplegia affecting unspecified side: Secondary | ICD-10-CM

## 2013-04-02 DIAGNOSIS — I69959 Hemiplegia and hemiparesis following unspecified cerebrovascular disease affecting unspecified side: Secondary | ICD-10-CM | POA: Insufficient documentation

## 2013-04-02 MED ORDER — TIZANIDINE HCL 4 MG PO TABS: 4.0000 mg | ORAL_TABLET | Freq: Three times a day (TID) | ORAL | Status: DC | PRN

## 2013-04-02 NOTE — Progress Notes (Signed)
Subjective:    Patient ID: Steven Read., male    DOB: 1956-04-16, 57 y.o.   MRN: [id number]  HPI Botox injection 02/25/2013 50  units right pronator teres 50 units right FCU 50 units right FCR 75 units right EDC 50 units right EIP 25 units right adductor pollicis 25 units right ADM 50 units right medial FDP 12.5 units interosseous x3 12.5 units opponens pollicis   Patient feels like the injection was very helpful. Help mainly in the forearm and wrist less so in the hand however. Minimal pain. No longer taking narcotic analgesics  Pain Inventory Average Pain 2 Pain Right Now 2 My pain is dull  In the last 24 hours, has pain interfered with the following? General activity 0 Relation with others 4 Enjoyment of life 5 What TIME of day is your pain at its worst? daytime Sleep (in general) Fair  Pain is worse with: some activites Pain improves with: medication Relief from Meds: 7  Mobility use a cane use a walker how many minutes can you walk? 10 ability to climb steps?  yes do you drive?  no needs help with transfers Do you have any goals in this area?  yes  Function disabled: date disabled . I need assistance with the following:  meal prep and household duties  Neuro/Psych weakness numbness tingling spasms dizziness confusion anxiety  Prior Studies Any changes since last visit?  no  Physicians involved in your care Any changes since last visit?  no   Family History  Problem Relation Age of Onset  . Coronary artery disease Neg Hx   . Colon cancer Neg Hx   . Alcohol abuse Mother   . Alcohol abuse Father   . Alcohol abuse    . Early death Mother   . Diabetes Sister   . Hyperlipidemia Sister   . Hypertension Sister   . Prostate cancer Father 42  . Breast cancer Paternal Grandmother    History   Social History  . Marital Status: Legally Separated    Spouse Name: N/A    Number of Children: N/A  . Years of Education: N/A   Social  History Main Topics  . Smoking status: Current Every Day Smoker -- 0.50 packs/day    Types: Cigarettes  . Smokeless tobacco: Former Systems developer    Quit date: 05/26/2010  . Alcohol Use: Yes     Comment: hx of alcohol abuse, reports drinking 2-3 liquor drinks per day  . Drug Use: No     Comment: h/o THC, cocaine, heroin; no longer using since 2011  . Sexually Active: No   Other Topics Concern  . None   Social History Narrative   Divorced, 2 children. 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 Rayann Heman is his cardiologist, last seen 03/2012; sees 1x/year    Dr. Krista Blue, [facility], is neurologist for seizures       Single, unemployed    Lives with his 24yo father   Current smoker since 1966   Past Surgical History  Procedure Laterality Date  . Right wrist orif  1976    for fx  . Pancreatic stent placement    . Inguinal hernia repair  1960    right   Past Medical History  Diagnosis Date  . Angioedema     rx requiring intubation/vent support suspected secondary to  ACE1 (but also on zithromax) 5/09 hospitalization   . Smoker   . ETOH abuse   . Chronic pancreatitis     (10/08 hops). admx 02/2010 pseudocyst aspirated during admxn. pancreatic dual stent placement at [facility] 11/11  . 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   . Gastritis     alcohol induced  . Depression     no hx of meds  . VF (ventricular fibrillation) 2011    arrest 6/11. successfully rescucitated w/prolinged hospitalization due to respitatory failure. QT prolongation during cooling, now resolved  . History of stroke 2011    reports it was caused by pain from pancreatitis- caused heart to stop and stroke   . 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 Anxiety   . Seizures 2011  . Edema   . 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 Anoxic brain damage   .  Anemia   . Hypertension 2011  . Arthritis   . Chronic pain 2011  . Diabetes mellitus 2013  . Hyperlipidemia 2011  . Inguinal hernia 2011  . COPD (chronic obstructive pulmonary disease)   . GERD (gastroesophageal reflux disease)   . Stroke    BP 148/80  Pulse 78  Resp 14  Ht 5\' 8"  (1.727 m)  Wt 166 lb (75.297 kg)  BMI 25.25 kg/m2  SpO2 98%     Review of Systems  Constitutional: Positive for fever, chills, appetite change and unexpected weight change.  Cardiovascular: Positive for leg swelling.  Gastrointestinal: Positive for nausea, abdominal pain, diarrhea and constipation.  Musculoskeletal: Positive for gait problem.  Neurological: Positive for dizziness, weakness and numbness.  Psychiatric/Behavioral: Positive for confusion. The patient is nervous/anxious.   All other systems reviewed and are negative.       Objective:   Physical Exam  Ashworth grade 2 at the biceps 2 at the pronator teres, no change with elbow flexed with pronation, Ashworth grade 3-4 at the wrist flexors Index finger hyperextended middle finger hyperextended fifth digit is hyperflexed except extended at the MCP. No pain with range of motion      Assessment & Plan:  1. Spastic hemiplegia status post CVA 2 years ago with good results after Botox injection #2. Would increase dose at the right FCR, to 75 units increase dose to the right pronator teres to 75 units. Eliminate interosseous and opponens pollicis injections., Eliminate right ADM Return to clinic in 2 months for repeat injection

## 2013-04-02 NOTE — Telephone Encounter (Signed)
Patient is calling to check on the refill for Lorazepam, and was told that he needs to schedule an appt.  Dr. Loraine Maple has no more opening so he was scheduled with his new doctor, Dr. Ardelia Mems for her first available, which is next Thursday, 7/3.  Can he please get enough of his Lorazepam to last until that appointment.

## 2013-04-03 ENCOUNTER — Ambulatory Visit (HOSPITAL_COMMUNITY): Payer: Medicare Other | Attending: Cardiovascular Disease | Admitting: Radiology

## 2013-04-03 VITALS — BP 132/62 | Ht 68.0 in | Wt 159.0 lb

## 2013-04-03 DIAGNOSIS — E119 Type 2 diabetes mellitus without complications: Secondary | ICD-10-CM | POA: Diagnosis not present

## 2013-04-03 DIAGNOSIS — J449 Chronic obstructive pulmonary disease, unspecified: Secondary | ICD-10-CM | POA: Insufficient documentation

## 2013-04-03 DIAGNOSIS — R079 Chest pain, unspecified: Secondary | ICD-10-CM | POA: Insufficient documentation

## 2013-04-03 DIAGNOSIS — Z8674 Personal history of sudden cardiac arrest: Secondary | ICD-10-CM | POA: Insufficient documentation

## 2013-04-03 DIAGNOSIS — I10 Essential (primary) hypertension: Secondary | ICD-10-CM | POA: Diagnosis not present

## 2013-04-03 DIAGNOSIS — R42 Dizziness and giddiness: Secondary | ICD-10-CM | POA: Insufficient documentation

## 2013-04-03 DIAGNOSIS — J4489 Other specified chronic obstructive pulmonary disease: Secondary | ICD-10-CM | POA: Insufficient documentation

## 2013-04-03 DIAGNOSIS — Z8673 Personal history of transient ischemic attack (TIA), and cerebral infarction without residual deficits: Secondary | ICD-10-CM | POA: Diagnosis not present

## 2013-04-03 DIAGNOSIS — R0602 Shortness of breath: Secondary | ICD-10-CM | POA: Insufficient documentation

## 2013-04-03 DIAGNOSIS — F17200 Nicotine dependence, unspecified, uncomplicated: Secondary | ICD-10-CM | POA: Diagnosis not present

## 2013-04-03 MED ORDER — REGADENOSON 0.4 MG/5ML IV SOLN
0.4000 mg | Freq: Once | INTRAVENOUS | Status: AC
  Administered 2013-04-03: 0.4 mg via INTRAVENOUS

## 2013-04-03 MED ORDER — TECHNETIUM TC 99M SESTAMIBI GENERIC - CARDIOLITE
30.0000 | Freq: Once | INTRAVENOUS | Status: AC | PRN
  Administered 2013-04-03: 30 via INTRAVENOUS

## 2013-04-03 MED ORDER — TECHNETIUM TC 99M SESTAMIBI GENERIC - CARDIOLITE
10.0000 | Freq: Once | INTRAVENOUS | Status: AC | PRN
  Administered 2013-04-03: 10 via INTRAVENOUS

## 2013-04-03 NOTE — Progress Notes (Signed)
[facility] [address] [place], [place] [address] [phone]    Cardiology Nuclear Med Study  Steven Frey. is a 57 y.o. male     MRN : [id number]     DOB: 09/18/1956  Procedure Date: 04/03/2013  Nuclear Med Background Indication for Stress Test:  Evaluation for Ischemia and Pending Surgical Clearance for (L) Inguinal hernia by Dr. Fanny Skates History:  COPD;11/02/11 Echo EF:55%; 2011 VFIB arrest(in setting of ETOH) Cardiac Risk Factors: CVA, Hypertension, Lipids, NIDDM and Smoker  Symptoms:  Chest Pain, Dizziness, DOE, Rapid HR and SOB   Nuclear Pre-Procedure Caffeine/Decaff Intake:  None > 12 hrs NPO After: 7:00pm   Lungs:  clear O2 Sat: 99 % on room air. IV 0.9% NS with Angio Cath:  22g  IV Site: L Hand x 1, tolerated well IV Started by:  Irven Baltimore, RN  Chest Size (in):  40 Cup Size: n/a  Height: 5\' 8"  (1.727 m)  Weight:  159 lb (72.122 kg)  BMI:  Body mass index is 24.18 kg/(m^2). Tech Comments:  Held Coreg and Metformin this am. Fasting CBG was 133 on arrival @ 8:20am. Irven Baltimore, RN    Nuclear Med Study 1 or 2 day study: 1 day  Stress Test Type:  Carlton Adam  Reading MD: Jenkins Rouge, MD  Order Authorizing Provider:  Thompson Grayer, MD  Resting Radionuclide: Technetium 83m Sestamibi  Resting Radionuclide Dose: 11.0 mCi   Stress Radionuclide:  Technetium 109m Sestamibi  Stress Radionuclide Dose: 33.0 mCi           Stress Protocol Rest HR: 62 Stress HR: 96  Rest BP: 132/82   Stress BP: 145/77  Exercise Time (min): n/a METS: n/a   Predicted Max HR: 164 bpm % Max HR: 58.54 bpm Rate Pressure Product: 13920   Dose of Adenosine (mg):  n/a Dose of Lexiscan: 0.4 mg  Dose of Atropine (mg): n/a Dose of Dobutamine: n/a mcg/kg/min (at max HR)  Stress Test Technologist: Ileene Hutchinson, EMT-P  Nuclear Technologist:  Charlton Amor, CNMT     Rest Procedure:  Myocardial perfusion imaging was performed at rest 45 minutes following  the intravenous administration of Technetium 63m Sestamibi. Rest ECG: NSR - Normal EKG  Stress Procedure:  The patient received IV Lexiscan 0.4 mg over 15-seconds.  Technetium 31m Sestamibi injected at 30-seconds.  Quantitative spect images were obtained after a 45 minute delay. Stress ECG: No significant change from baseline ECG  QPS Raw Data Images:  Normal; no motion artifact; normal heart/lung ratio. Stress Images:  Normal homogeneous uptake in all areas of the myocardium. Rest Images:  Normal homogeneous uptake in all areas of the myocardium. Subtraction (SDS):  Normal Transient Ischemic Dilatation (Normal <1.22):  0.99 Lung/Heart Ratio (Normal <0.45):  0.25  Quantitative Gated Spect Images QGS EDV:  100 ml QGS ESV:  42 ml  Impression Exercise Capacity:  Lexiscan with no exercise. BP Response:  Normal blood pressure response. Clinical Symptoms:  There is dyspnea. ECG Impression:  No significant ST segment change suggestive of ischemia. Comparison with Prior Nuclear Study: No images to compare  Overall Impression:  Normal stress nuclear study.  LV Ejection Fraction: 58%.  LV Wall Motion:  NL LV Function; NL Wall Motion  Jenkins Rouge

## 2013-04-03 NOTE — Telephone Encounter (Signed)
1 week supply given, sent via fax. [employer]

## 2013-04-03 NOTE — Telephone Encounter (Signed)
Pt's sister, Vaughan Basta, notified and reminded of appt on 04/11/2013.  Alyla Pietila, Loralyn Freshwater, [place]

## 2013-04-08 ENCOUNTER — Telehealth: Payer: Self-pay | Admitting: Internal Medicine

## 2013-04-08 NOTE — Telephone Encounter (Signed)
Follow Up      Pt calling in following up on stress test results. Please call.

## 2013-04-08 NOTE — Telephone Encounter (Signed)
Called patient and lmom with normal results

## 2013-04-11 ENCOUNTER — Ambulatory Visit (INDEPENDENT_AMBULATORY_CARE_PROVIDER_SITE_OTHER): Payer: Medicare Other | Admitting: Family Medicine

## 2013-04-11 VITALS — BP 141/87 | HR 57 | Temp 97.8°F | Ht 68.0 in | Wt 164.8 lb

## 2013-04-11 DIAGNOSIS — E119 Type 2 diabetes mellitus without complications: Secondary | ICD-10-CM | POA: Diagnosis not present

## 2013-04-11 DIAGNOSIS — Z8669 Personal history of other diseases of the nervous system and sense organs: Secondary | ICD-10-CM

## 2013-04-11 DIAGNOSIS — I10 Essential (primary) hypertension: Secondary | ICD-10-CM

## 2013-04-11 DIAGNOSIS — R569 Unspecified convulsions: Secondary | ICD-10-CM

## 2013-04-11 DIAGNOSIS — Z8673 Personal history of transient ischemic attack (TIA), and cerebral infarction without residual deficits: Secondary | ICD-10-CM | POA: Diagnosis not present

## 2013-04-11 DIAGNOSIS — Z01810 Encounter for preprocedural cardiovascular examination: Secondary | ICD-10-CM

## 2013-04-11 DIAGNOSIS — F528 Other sexual dysfunction not due to a substance or known physiological condition: Secondary | ICD-10-CM

## 2013-04-11 LAB — POCT GLYCOSYLATED HEMOGLOBIN (HGB A1C): Hemoglobin A1C: 4.9

## 2013-04-11 MED ORDER — LORAZEPAM 1 MG PO TABS: 1.0000 mg | ORAL_TABLET | Freq: Two times a day (BID) | ORAL | Status: DC | PRN

## 2013-04-11 NOTE — Assessment & Plan Note (Signed)
Pt requests referral to neurology for seizure disorder. Looks like he has previously seen Dr. Krista Blue. Will order neuro referral. Ativan refilled today. Pt will also continue keppra.

## 2013-04-11 NOTE — Assessment & Plan Note (Signed)
Reviewed note from Dr. Rayann Heman (cardiology) on 6/19, regarding pre-operative cardiac clearance. It stated that if myoview was low risk, pt could proceed with surgery with typical precautions. Myoview read as normal per EMR. Informed pt and his sister that he should be safe to proceed with surgery.

## 2013-04-11 NOTE — Assessment & Plan Note (Signed)
Pt brought this problem up at end of visit today. Did not have time to thoroughly evaluate this problem or perform genitourinary exam. Explained my reservations about rx'ing a PDE5 inhibitor to him due to his cardiac history and potential need for nitroglycerin in the future. Asked patient to return for another visit at which we can discuss this in greater detail and perform exam. May eventually consider referral to urology for additional input. Precepted with Dr. Adrian Blackwater who agrees with this plan.

## 2013-04-11 NOTE — Patient Instructions (Signed)
It was nice to meet you today!  The phone number for Dr. Dalbert Batman is: [phone]) [phone]  I have refilled your ativan. Come back in 3 months to follow up on your chronic medical problems. We are referring you to neurology. You'll get a call to schedule this. Your diabetes number is very good. You can stop taking the metformin. Come back if you want to talk more about the erectile dysfunction.  I will call you if your test results are not normal.  Otherwise, I will send you a letter.  If you do not hear from me with in 2 weeks please call our office.     Be well, Dr. Ardelia Mems

## 2013-04-11 NOTE — Assessment & Plan Note (Signed)
This is my first time meeting Steven Frey, who has hx of ischemic CVA. He is not taking aspirin or a statin currently. His sister reports that he previously took aspirin but had "stomach problems" so he had to stop it. Did not explore with pt why he is not on a statin, I presume possibly due to hx of liver disease/cirrhosis. Will plan to address this further with pt at future visit.

## 2013-04-11 NOTE — Progress Notes (Signed)
Patient ID: Steven Read., male   DOB: [birth year], 57 y.o.   MRN: [id number]  Pt is accompanied by his sister who assists with providing the history.  HPI:  Surgical clearance: Has inguinal hernia for which he is planning to have surgery done. Saw Dr. Dalbert Batman in January but needed cardiac clearance prior to surgery. Underwent stress test last week which was normal. Wanting to know results and whether he can go ahead with his surgery.  Diabetes: not taking metformin. Last a1c was 5.0 in January 2014. Does not think he has diabetes. Denies chest pain. No claudication type symptoms.   Hypertension: does not BP at home. No CP or SOB. Currently taking carvedilol BID.  Ativan refill: requests refill of ativan. Has been using in 2 times per day for the last week. Had been out of it for 1 week prior to that. Before he ran out he was taking it 3 times per day. Has done well with taking it twice per day. States he uses the ativan for anxiety and to control his seizure disorder. States if he doesn't take it, his right arm begins to twitch. Also takes keppra 1000mg  BID. Had referral to neurologist but wasn't able to go before due to family problems, so needs another referral.   Erectile dysfunction: pt not currently sexually active but is interested in becoming active if he finds a partner. States he is unable to get an erection. Interested in pharmacologic therapy for this. Has rx for nitroglycerin but states he hasn't taken it in over a year.  ROS: See HPI. No CP, SOB, leg pain with walking.  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 Sees pain management for right arm, does botox injections. Sister is his primary caretaker although they do not live together. Reports occasional alcohol use.  PHYSICAL EXAM: BP 141/87  Pulse 57  Temp(Src) 97.8 F (36.6 C) (Oral)  Ht 5\' 8"  (1.727 m)  Wt 164 lb 12.8 oz (74.753 kg)  BMI 25.06 kg/m2 Gen:  NAD Heart: RRR Lungs: CTAB Neuro: speech slightly slurred (likely baseline post-stroke). PERRL. Follows commands. Grossly oriented. Ext: R hand is contracted. Occasional twitching noted.

## 2013-04-11 NOTE — Assessment & Plan Note (Addendum)
BP only very slightly elevated today at 141/87. Will continue current regimen. Check CMET today. F/u in 3 months or sooner if needed.

## 2013-04-11 NOTE — Assessment & Plan Note (Signed)
Pt not taking any diabetes medicines and a1c is 4.9. Informed him that he does not need to take metformin. From reviewing his records, it appears he has only ever had one elevated HgbA1c, at >10 in 2013. I wonder if this was not a lab error given that all of the rest of his a1c's have been normal. Will plan to recheck a1c in 3 months and if still negative, "resolve" diabetes on problem list.

## 2013-04-12 LAB — COMPREHENSIVE METABOLIC PANEL
ALT: 10 U/L (ref 0–53)
AST: 13 U/L (ref 0–37)
Albumin: 4.3 g/dL (ref 3.5–5.2)
Alkaline Phosphatase: 91 U/L (ref 39–117)
BUN: 11 mg/dL (ref 6–23)
CO2: 24 mEq/L (ref 19–32)
Calcium: 9.1 mg/dL (ref 8.4–10.5)
Chloride: 107 mEq/L (ref 96–112)
Creat: 0.94 mg/dL (ref 0.50–1.35)
Glucose, Bld: 88 mg/dL (ref 70–99)
Potassium: 4.1 mEq/L (ref 3.5–5.3)
Sodium: 138 mEq/L (ref 135–145)
Total Bilirubin: 0.9 mg/dL (ref 0.3–1.2)
Total Protein: 7 g/dL (ref 6.0–8.3)

## 2013-04-16 ENCOUNTER — Telehealth: Payer: Self-pay | Admitting: *Deleted

## 2013-04-26 ENCOUNTER — Encounter: Payer: Self-pay | Admitting: Family Medicine

## 2013-04-30 ENCOUNTER — Other Ambulatory Visit: Payer: Self-pay | Admitting: Family Medicine

## 2013-05-01 MED ORDER — CARVEDILOL 3.125 MG PO TABS: 3.1250 mg | ORAL_TABLET | Freq: Two times a day (BID) | ORAL | Status: DC

## 2013-06-06 ENCOUNTER — Encounter: Payer: Medicare Other | Attending: Physical Medicine & Rehabilitation

## 2013-06-06 ENCOUNTER — Ambulatory Visit (HOSPITAL_BASED_OUTPATIENT_CLINIC_OR_DEPARTMENT_OTHER): Payer: Medicare Other | Admitting: Physical Medicine & Rehabilitation

## 2013-06-06 ENCOUNTER — Encounter: Payer: Self-pay | Admitting: Physical Medicine & Rehabilitation

## 2013-06-06 VITALS — BP 132/83 | HR 77 | Resp 14 | Ht 68.0 in | Wt 164.0 lb

## 2013-06-06 DIAGNOSIS — I69959 Hemiplegia and hemiparesis following unspecified cerebrovascular disease affecting unspecified side: Secondary | ICD-10-CM | POA: Diagnosis not present

## 2013-06-06 DIAGNOSIS — G8110 Spastic hemiplegia affecting unspecified side: Secondary | ICD-10-CM

## 2013-06-06 MED ORDER — TIZANIDINE HCL 4 MG PO TABS: 4.0000 mg | ORAL_TABLET | Freq: Four times a day (QID) | ORAL | Status: DC | PRN

## 2013-06-06 NOTE — Progress Notes (Signed)
1. Right spastic hemiplegia. He's had improvement in pain after Botox injection however deformities have not changed much. He had 300 units injected. Will increase dose to 400 units. Also will concentrate more the medication into the pronator teres and wrist flexor muscle groups   Botox injection under estim and EMG guidance 50unit/ml dilution REMS form on file Informed consent obtained  50  units right pronator teres 50 units right FCU 50 units right FCR 75 units right EDC 50 units right EIP 25 units right adductor pollicis 25 units right ADM 50 units right medial FDP 12.5 units interosseous x3 12.5 units opponens pollicis  Continue Zanaflex increase to QID

## 2013-06-12 ENCOUNTER — Telehealth: Payer: Self-pay | Admitting: Internal Medicine

## 2013-06-12 ENCOUNTER — Ambulatory Visit (INDEPENDENT_AMBULATORY_CARE_PROVIDER_SITE_OTHER): Payer: Medicare Other | Admitting: General Surgery

## 2013-06-12 ENCOUNTER — Encounter (INDEPENDENT_AMBULATORY_CARE_PROVIDER_SITE_OTHER): Payer: Self-pay | Admitting: General Surgery

## 2013-06-12 VITALS — BP 140/90 | HR 68 | Temp 98.3°F | Resp 14 | Ht 68.0 in | Wt 169.6 lb

## 2013-06-12 DIAGNOSIS — K4090 Unilateral inguinal hernia, without obstruction or gangrene, not specified as recurrent: Secondary | ICD-10-CM

## 2013-06-12 NOTE — Telephone Encounter (Signed)
Per Dr Rayann Heman low to moderate risk from a cardiac standpoint.  May proceed with surgery without further CV testing.

## 2013-06-12 NOTE — Patient Instructions (Signed)
You have stated that you would like to go ahead with elective surgery to repair your left inguinal hernia. That is appropriate.  Your heart test looks good. We will check with Dr. Rayann Heman to make sure he is okay with [place] going ahead with the surgery.  You will be scheduled for surgery to repair your left inguinal hernia with mesh at [facility] hospital. It is possible that we will keep you overnight for observation..    Inguinal Hernia, Adult Muscles help keep everything in the body in its proper place. But if a weak spot in the muscles develops, something can poke through. That is called a hernia. When this happens in the lower part of the belly (abdomen), it is called an inguinal hernia. (It takes its name from a part of the body in this region called the inguinal canal.) A weak spot in the wall of muscles lets some fat or part of the small intestine bulge through. An inguinal hernia can develop at any age. Men get them more often than women. CAUSES  In adults, an inguinal hernia develops over time.  It can be triggered by:  Suddenly straining the muscles of the lower abdomen.  Lifting heavy objects.  Straining to have a bowel movement. Difficult bowel movements (constipation) can lead to this.  Constant coughing. This may be caused by smoking or lung disease.  Being overweight.  Being pregnant.  Working at a job that requires long periods of standing or heavy lifting.  Having had an inguinal hernia before. One type can be an emergency situation. It is called a strangulated inguinal hernia. It develops if part of the small intestine slips through the weak spot and cannot get back into the abdomen. The blood supply can be cut off. If that happens, part of the intestine may die. This situation requires emergency surgery. SYMPTOMS  Often, a small inguinal hernia has no symptoms. It is found when a healthcare provider does a physical exam. Larger hernias usually have symptoms.   In adults,  symptoms may include:  A lump in the groin. This is easier to see when the person is standing. It might disappear when lying down.  In men, a lump in the scrotum.  Pain or burning in the groin. This occurs especially when lifting, straining or coughing.  A dull ache or feeling of pressure in the groin.  Signs of a strangulated hernia can include:  A bulge in the groin that becomes very painful and tender to the touch.  A bulge that turns red or purple.  Fever, nausea and vomiting.  Inability to have a bowel movement or to pass gas. DIAGNOSIS  To decide if you have an inguinal hernia, a healthcare provider will probably do a physical examination.  This will include asking questions about any symptoms you have noticed.  The healthcare provider might feel the groin area and ask you to cough. If an inguinal hernia is felt, the healthcare provider may try to slide it back into the abdomen.  Usually no other tests are needed. TREATMENT  Treatments can vary. The size of the hernia makes a difference. Options include:  Watchful waiting. This is often suggested if the hernia is small and you have had no symptoms.  No medical procedure will be done unless symptoms develop.  You will need to watch closely for symptoms. If any occur, contact your healthcare provider right away.  Surgery. This is used if the hernia is larger or you have symptoms.  Open surgery. This is usually an outpatient procedure (you will not stay overnight in a hospital). An cut (incision) is made through the skin in the groin. The hernia is put back inside the abdomen. The weak area in the muscles is then repaired by herniorrhaphy or hernioplasty. Herniorrhaphy: in this type of surgery, the weak muscles are sewn back together. Hernioplasty: a patch or mesh is used to close the weak area in the abdominal wall.  Laparoscopy. In this procedure, a surgeon makes small incisions. A thin tube with a tiny video camera  (called a laparoscope) is put into the abdomen. The surgeon repairs the hernia with mesh by looking with the video camera and using two long instruments. HOME CARE INSTRUCTIONS   After surgery to repair an inguinal hernia:  You will need to take pain medicine prescribed by your healthcare provider. Follow all directions carefully.  You will need to take care of the wound from the incision.  Your activity will be restricted for awhile. This will probably include no heavy lifting for several weeks. You also should not do anything too active for a few weeks. When you can return to work will depend on the type of job that you have.  During "watchful waiting" periods, you should:  Maintain a healthy weight.  Eat a diet high in fiber (fruits, vegetables and whole grains).  Drink plenty of fluids to avoid constipation. This means drinking enough water and other liquids to keep your urine clear or pale yellow.  Do not lift heavy objects.  Do not stand for long periods of time.  Quit smoking. This should keep you from developing a frequent cough. SEEK MEDICAL CARE IF:   A bulge develops in your groin area.  You feel pain, a burning sensation or pressure in the groin. This might be worse if you are lifting or straining.  You develop a fever of more than 100.5 F (38.1 C). SEEK IMMEDIATE MEDICAL CARE IF:   Pain in the groin increases suddenly.  A bulge in the groin gets bigger suddenly and does not go down.  For men, there is sudden pain in the scrotum. Or, the size of the scrotum increases.  A bulge in the groin area becomes red or purple and is painful to touch.  You have nausea or vomiting that does not go away.  You feel your heart beating much faster than normal.  You cannot have a bowel movement or pass gas.  You develop a fever of more than 102.0 F (38.9 C). Document Released: 02/12/2009 Document Revised: 12/19/2011 Document Reviewed: 02/12/2009 [facility] Patient  Information 2014 [place], [place].

## 2013-06-12 NOTE — Progress Notes (Signed)
Patient ID: Steven Read., male   DOB: 1955-10-19, 57 y.o.   MRN: [id number]  Chief Complaint  Patient presents with  . New Evaluation    eval for ING hernia    HPI Steven Frey. is a 57 y.o. male.  He returns to see me for consideration of repair of a symptomatic left inguinal hernia. His primary care physician is Dr. Beverlee Nims at [facility] family practice. His cardiologist is Dr. Thompson Grayer at [facility]  I last saw Steven Frey in the office on 11/01/2012.This patient has a complex past medical history with chronic pancreatitis in the setting of alcohol abuse, cirrhosis, possible hepatocellular carcinoma, seizure disorder, prior stroke with spastic right hemiparesis. , depression. He has undergone pancreatic ductal stenting at [facility]. He had an out of hospital VF arrest in June 2011 in the setting of heavy EtOH abuse and electrolyte depletion. He was resuscitated. He had QT prolongation during cooling that eventually resolved. He had prolonged hospitalization with anoxic brain injury. Cardiac catheterization June 2011 showed normal coronaries, EF [id number], grade 2 diastolic dysfunction. He refused to wear a life vest. Thought to be a poor ICD candidate. Saw Dr. Caryl Comes and Dr. Rayann Heman. Also has hypertension, diabetes, ongoing tobacco abuse.  After my initial evaluation,  I requested cardiac clearance. He saw Dr. Rayann Heman on 03/28/2013, and he confirmed his multiple CAD risk factors. A cardiology nuclear medicine study was performed on 04/03/2013. This was a normal stress nuclear study with ejection fraction 58%, normal LV function, normal wall motion. Patient states  Dr. Rayann Heman has told him he can have his hernia surgery. We will need a written or fax to confirm  that but it seems logical.  He says his hernia bothers him. He says he is ready to have this repaired. He is here today with his brother. HPI  Past Medical History  Diagnosis Date  . Angioedema     rx requiring intubation/vent support  suspected secondary to ACE1 (but also on zithromax) 5/09 hospitalization   . Smoker   . ETOH abuse   . Chronic pancreatitis     (10/08 hops). admx 02/2010 pseudocyst aspirated during admxn. pancreatic dual stent placement at [facility] 11/11  . 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   . Gastritis     alcohol induced  . Depression     no hx of meds  . VF (ventricular fibrillation) 2011    arrest 6/11. successfully rescucitated w/prolinged hospitalization due to respitatory failure. QT prolongation during cooling, now resolved  . History of stroke 2011    reports it was caused by pain from pancreatitis- caused heart to stop and stroke   . 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 Anxiety   . Seizures 2011  . Edema   . 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 Anoxic brain damage   . Anemia   . Hypertension 2011  . Arthritis   . Chronic pain 2011  . Diabetes mellitus 2013  . Hyperlipidemia 2011  . Inguinal hernia 2011  . COPD (chronic obstructive pulmonary disease)   . GERD (gastroesophageal reflux disease)   . Stroke     Past Surgical History  Procedure Laterality Date  . Right wrist orif  1976    for fx  . Pancreatic stent placement    . Inguinal hernia repair  1960    right    Family History  Problem Relation Age of Onset  .  Coronary artery disease Neg Hx   . Colon cancer Neg Hx   . Alcohol abuse Mother   . Alcohol abuse Father   . Alcohol abuse    . Early death Mother   . Diabetes Sister   . Hyperlipidemia Sister   . Hypertension Sister   . Prostate cancer Father 69  . Breast cancer Paternal Grandmother     Social History History  Substance Use Topics  . Smoking status: Current Every Day Smoker -- 0.50 packs/day    Types: Cigarettes  . Smokeless tobacco: Former Systems developer    Quit date: 05/26/2010  . Alcohol Use: Yes     Comment: hx of alcohol abuse, reports drinking 2-3 liquor drinks per day     Allergies  Allergen Reactions  . Azithromycin Swelling    Throat swelling, body swelling  . Lisinopril Other (See Comments)    REACTION: facial/neck edema    Current Outpatient Prescriptions  Medication Sig Dispense Refill  . carvedilol (COREG) 3.125 MG tablet Take 1 tablet (3.125 mg total) by mouth 2 (two) times daily with a meal.  180 tablet  0  . CREON 12000 UNITS CPEP TAKE ONE CAPSULE BY MOUTH EVERY DAY  30 capsule  5  . folic acid (FOLVITE) 1 MG tablet Take 1 tablet (1 mg total) by mouth daily.  90 tablet  3  . ibuprofen (ADVIL,MOTRIN) 100 MG tablet Take 100 mg by mouth every 6 (six) hours as needed for fever.      Marland Kitchen LORazepam (ATIVAN) 1 MG tablet Take 1 tablet (1 mg total) by mouth 2 (two) times daily as needed for anxiety.  60 tablet  2  . OnabotulinumtoxinA (BOTOX IJ) Inject as directed.      . thiamine (VITAMIN B-1) 100 MG tablet Take 1 tablet (100 mg total) by mouth daily.  90 tablet  3  . tiZANidine (ZANAFLEX) 4 MG tablet Take 1 tablet (4 mg total) by mouth every 6 (six) hours as needed.  120 tablet  5  . vitamin B-12 (CYANOCOBALAMIN) 1000 MCG tablet Take 1 tablet (1,000 mcg total) by mouth daily.  90 tablet  3   No current facility-administered medications for this visit.    Review of Systems Review of Systems  Constitutional: Negative for fever, chills and unexpected weight change.       He has chronic difficulties related to his stroke and is not very active.  HENT: Negative for hearing loss, congestion, sore throat, trouble swallowing and voice change.   Eyes: Negative for visual disturbance.  Respiratory: Negative for cough and wheezing.   Cardiovascular: Positive for chest pain. Negative for palpitations and leg swelling.       Occasional shortness of breath and chest pain  with exertion which are short lived.  Gastrointestinal: Negative for nausea, vomiting, abdominal pain, diarrhea, constipation, blood in stool, abdominal distention, anal bleeding and rectal  pain.  Genitourinary: Negative for frequency, hematuria, flank pain and difficulty urinating.  Musculoskeletal: Positive for gait problem. Negative for arthralgias.  Skin: Negative for rash and wound.  Neurological: Positive for speech difficulty and weakness. Negative for seizures, syncope and headaches.  Hematological: Negative for adenopathy. Does not bruise/bleed easily.  Psychiatric/Behavioral: Negative for confusion.    Blood pressure 140/90, pulse 68, temperature 98.3 F (36.8 C), temperature source Temporal, resp. rate 14, height 5\' 8"  (1.727 m), weight 169 lb 9.6 oz (76.93 kg).  Physical Exam Physical Exam  Constitutional: He is oriented to person, place, and time. He appears well-nourished.  No distress.  Alert and cooperative. Minimal speech impediment. Appropriate answers to questions.  HENT:  Head: Normocephalic.  Nose: Nose normal.  Mouth/Throat: No oropharyngeal exudate.  Eyes: Conjunctivae and EOM are normal. Pupils are equal, round, and reactive to light. Right eye exhibits no discharge. Left eye exhibits no discharge. No scleral icterus.  Neck: Normal range of motion. Neck supple. No JVD present. No tracheal deviation present. No thyromegaly present.  Cardiovascular: Normal rate, regular rhythm, normal heart sounds and intact distal pulses.   No murmur heard. Regular rate and rhythm. No murmurs, rubs, or gallops.  Pulmonary/Chest: Effort normal and breath sounds normal. No stridor. No respiratory distress. He has no wheezes. He has no rales. He exhibits no tenderness.  Abdominal: Soft. Bowel sounds are normal. He exhibits no distension and no mass. There is no tenderness. There is no rebound and no guarding.  Genitourinary:  Large left inguinal hernia, mostly reducible when supine. Scar in the right groin. No evidence of recurrent hernia on the right. Penis scrotum and testes normal.  Musculoskeletal: He exhibits no edema and no tenderness.  Atrophy and right hand  contractures with spasticity.  Lymphadenopathy:    He has no cervical adenopathy.  Neurological: He is alert and oriented to person, place, and time. He has normal reflexes. Coordination normal.  Mild right upper and right lower extremity  weakness.  Skin: Skin is warm and dry. No rash noted. He is not diaphoretic. No erythema. No pallor.  Psychiatric: He has a normal mood and affect. His behavior is normal. Judgment and thought content normal.    Data Reviewed My old notes. [place] cardiology notes. Nuclear medicine study.  Assessment    Left inguinal hernia with episodes of possible incarceration & obstruction. elective repair is indicated to prevent a strangulation event.   History right inguinal hernia repair as a child, no evidence of recurrence on physical exam.   Will need cardiac clearance preop. This may be essentially complete  Tobacco abuse   Seizure disorder   Hypertension   Diabetes mellitus type 2   Chronic arm pain, right   History chronic pancreatitis and alcohol abuse, status post PD duct stenting at [facility]   History of cirrhosis   History CVA with residual right side hemiplegia   Depression   History of VF arrest and anoxic brain injury, June 2011.     Plan    Once we obtain a formal cardiac clearance note, we'll proceed with scheduling of open repair of his left inguinal hernia with mesh. We will do this at [place]. Probably will observe overnight for safety's sake because of his numerous comorbidities.  I discussed the indications, details, techniques, and numerous risks of the surgery with the patient and his brother. They're aware of the problems of bleeding, infection, recurrence of the hernia, nerve damage with chronic pain or numbness, injury to  the testicle, injury to other adjacent organs. They are aware of possible cardiac, neurologic, pulmonary or thromboembolic problems. All their questions are answered. They understand all these issues.  They agree with this plan.        Steven Frey. Steven Frey, M.D., [facility] General and Minimally invasive Surgery Breast and Colorectal Surgery Office:   [phone] Pager:   [phone]  06/12/2013, 4:38 PM

## 2013-06-12 NOTE — Telephone Encounter (Signed)
NEW PROBLEM   Annie/CCS want to know if pt is cleared for [facility] sx. Please call Deneise Lever or send through epic.

## 2013-06-25 ENCOUNTER — Other Ambulatory Visit: Payer: Self-pay | Admitting: Family Medicine

## 2013-06-28 ENCOUNTER — Other Ambulatory Visit: Payer: Self-pay | Admitting: Family Medicine

## 2013-07-02 ENCOUNTER — Encounter (HOSPITAL_COMMUNITY)
Admission: RE | Admit: 2013-07-02 | Discharge: 2013-07-02 | Disposition: A | Discharge status: Home / Self Care | Payer: Medicare Other | Source: Ambulatory Visit | Attending: General Surgery | Admitting: General Surgery

## 2013-07-02 ENCOUNTER — Ambulatory Visit (HOSPITAL_COMMUNITY)
Admission: RE | Admit: 2013-07-02 | Discharge: 2013-07-02 | Disposition: A | Discharge status: Home / Self Care | Payer: Medicare Other | Source: Ambulatory Visit | Attending: Anesthesiology | Admitting: Anesthesiology

## 2013-07-02 ENCOUNTER — Encounter (HOSPITAL_COMMUNITY): Payer: Self-pay | Admitting: Respiratory Therapy

## 2013-07-02 ENCOUNTER — Encounter (HOSPITAL_COMMUNITY): Payer: Self-pay

## 2013-07-02 DIAGNOSIS — J449 Chronic obstructive pulmonary disease, unspecified: Secondary | ICD-10-CM | POA: Insufficient documentation

## 2013-07-02 DIAGNOSIS — K4090 Unilateral inguinal hernia, without obstruction or gangrene, not specified as recurrent: Secondary | ICD-10-CM | POA: Insufficient documentation

## 2013-07-02 DIAGNOSIS — I10 Essential (primary) hypertension: Secondary | ICD-10-CM | POA: Diagnosis not present

## 2013-07-02 DIAGNOSIS — Z01812 Encounter for preprocedural laboratory examination: Secondary | ICD-10-CM | POA: Insufficient documentation

## 2013-07-02 DIAGNOSIS — J4489 Other specified chronic obstructive pulmonary disease: Secondary | ICD-10-CM | POA: Insufficient documentation

## 2013-07-02 DIAGNOSIS — Z01818 Encounter for other preprocedural examination: Secondary | ICD-10-CM | POA: Insufficient documentation

## 2013-07-02 DIAGNOSIS — Z8673 Personal history of transient ischemic attack (TIA), and cerebral infarction without residual deficits: Secondary | ICD-10-CM | POA: Insufficient documentation

## 2013-07-02 HISTORY — DX: Pneumonia, unspecified organism: J18.9

## 2013-07-02 LAB — URINALYSIS, ROUTINE W REFLEX MICROSCOPIC
Glucose, UA: 100 mg/dL — AB
Hgb urine dipstick: NEGATIVE
Ketones, ur: NEGATIVE mg/dL
Leukocytes, UA: NEGATIVE
Nitrite: NEGATIVE
Protein, ur: NEGATIVE mg/dL
Specific Gravity, Urine: 1.031 — ABNORMAL HIGH (ref 1.005–1.030)
Urobilinogen, UA: 1 mg/dL (ref 0.0–1.0)
pH: 6.5 (ref 5.0–8.0)

## 2013-07-02 LAB — CBC WITH DIFFERENTIAL/PLATELET
Basophils Absolute: 0 10*3/uL (ref 0.0–0.1)
Basophils Relative: 0 % (ref 0–1)
Eosinophils Absolute: 0.2 10*3/uL (ref 0.0–0.7)
Eosinophils Relative: 2 % (ref 0–5)
HCT: 45.9 % (ref 39.0–52.0)
Hemoglobin: 15.1 g/dL (ref 13.0–17.0)
Lymphocytes Relative: 40 % (ref 12–46)
Lymphs Abs: 3.5 10*3/uL (ref 0.7–4.0)
MCH: 31.7 pg (ref 26.0–34.0)
MCHC: 32.9 g/dL (ref 30.0–36.0)
MCV: 96.2 fL (ref 78.0–100.0)
Monocytes Absolute: 0.5 10*3/uL (ref 0.1–1.0)
Monocytes Relative: 6 % (ref 3–12)
Neutro Abs: 4.5 10*3/uL (ref 1.7–7.7)
Neutrophils Relative %: 52 % (ref 43–77)
Platelets: 266 10*3/uL (ref 150–400)
RBC: 4.77 MIL/uL (ref 4.22–5.81)
RDW: 13.6 % (ref 11.5–15.5)
WBC: 8.7 10*3/uL (ref 4.0–10.5)

## 2013-07-02 LAB — COMPREHENSIVE METABOLIC PANEL
ALT: 18 U/L (ref 0–53)
AST: 18 U/L (ref 0–37)
Albumin: 4.1 g/dL (ref 3.5–5.2)
Alkaline Phosphatase: 113 U/L (ref 39–117)
BUN: 11 mg/dL (ref 6–23)
CO2: 24 mEq/L (ref 19–32)
Calcium: 9.3 mg/dL (ref 8.4–10.5)
Chloride: 101 mEq/L (ref 96–112)
Creatinine, Ser: 1.04 mg/dL (ref 0.50–1.35)
GFR calc Af Amer: >90 mL/min (ref >90)
GFR calc non Af Amer: 78 mL/min — ABNORMAL LOW (ref >90)
Glucose, Bld: 94 mg/dL (ref 70–99)
Potassium: 4.1 mEq/L (ref 3.5–5.1)
Sodium: 137 mEq/L (ref 135–145)
Total Bilirubin: 0.5 mg/dL (ref 0.3–1.2)
Total Protein: 8.1 g/dL (ref 6.0–8.3)

## 2013-07-02 LAB — SURGICAL PCR SCREEN
MRSA, PCR: POSITIVE — AB
Staphylococcus aureus: POSITIVE — AB

## 2013-07-02 NOTE — Pre-Procedure Instructions (Signed)
Marty Myerson.  07/02/2013   Your procedure is scheduled on:  Friday, October 3rd  Report to Main Entrance "A" at 0730 AM.  Call this number if you have problems the morning of surgery: [phone]   Remember:   Do not eat food or drink liquids after midnight.   Take these medicines the morning of surgery with A SIP OF WATER: Coreg, Ativan if needed, Zanaflex   Do not wear jewelry.  Do not wear lotions, powders, or perfumes. You may wear deodorant.  Do not shave 48 hours prior to surgery. Men may shave face and neck.  Do not bring valuables to the hospital.  [facility] is not responsible   for any belongings or valuables.  Contacts, dentures or bridgework may not be worn into surgery.  Leave suitcase in the car. After surgery it may be brought to your room.  For patients admitted to the hospital, checkout time is 11:00 AM the day of discharge.   Patients discharged the day of surgery will not be allowed to drive home.   Special Instructions: Shower using CHG 2 nights before surgery and the night before surgery.  If you shower the day of surgery use CHG.  Use special wash - you have one bottle of CHG for all showers.  You should use approximately 1/3 of the bottle for each shower.   Please read over the following fact sheets that you were given: Pain Booklet, Coughing and Deep Breathing, MRSA Information and Surgical Site Infection Prevention

## 2013-07-02 NOTE — Progress Notes (Signed)
Primary physician - dr. Ardelia Mems  Cardiologist - dr. Rayann Heman- saw last month Stress test, ekg in epic from June 2014

## 2013-07-03 ENCOUNTER — Other Ambulatory Visit (INDEPENDENT_AMBULATORY_CARE_PROVIDER_SITE_OTHER): Payer: Self-pay | Admitting: *Deleted

## 2013-07-03 ENCOUNTER — Telehealth (INDEPENDENT_AMBULATORY_CARE_PROVIDER_SITE_OTHER): Payer: Self-pay | Admitting: *Deleted

## 2013-07-03 DIAGNOSIS — Z22322 Carrier or suspected carrier of Methicillin resistant Staphylococcus aureus: Secondary | ICD-10-CM

## 2013-07-03 MED ORDER — MUPIROCIN CALCIUM 2 % NA OINT: TOPICAL_OINTMENT | Freq: Two times a day (BID) | NASAL | Status: DC

## 2013-07-03 NOTE — Progress Notes (Signed)
Notified Dr. Darrel Hoover office of positive MRSA and Staph (PCR) and need for Mupirocin Rx to be called in. Spoke with Pamala Hurry.

## 2013-07-03 NOTE — Progress Notes (Signed)
Anesthesia Chart Review:  Patient is a 57 year old male scheduled for open repair of left inguinal hernia with mesh on 07/12/13 by Dr. Dalbert Batman.    History includes out of hospital v-fib arrest '11 in the setting of heavy ETOH use and electrolyte depletion (required [employer] Protocol and had prolonged QT during cooling that resolved) with prolonged hospitalization and anoxic brain injury, smoking, COPD, HTN, HLD, GERD, CVA, seizures, ETOH abuse, chronic pancreatitis s/p pancreatic ductal stenting [facility], hepatic abscess '11, cirrhosis, anemia, angioedema requiring ventilator support in 2009 (likely related to ACEI [lisinopril] but was also on azithromycin at the time). PCP is with [facility] visit with Dr. Ardelia Mems who is aware of plans for surgery. He saw Cardiologist Dr. Rayann Heman for preoperative clearance and felt patient would be moderate risk from a cardiac standpoint, but could proceed without further CV testing.  PRN cardiology follow-up was recommended.  Notes from Dr. Larae Grooms and Dr. Rayann Heman indicate that he is not candidate for ICD implant.  EKG on 03/28/13 showed NSR, inferior infarct (age undetermined), cannot rule out anterior infarct (age undetermined).  Nuclear stress test on 04/03/13 showed: Normal stress nuclear study. LV Ejection Fraction: 58%. LV Wall Motion: NL LV Function; NL Wall Motion.  Echo on 11/02/11 showed: - Left ventricle: The cavity size was normal. Systolic function was normal. The estimated ejection fraction was 55%. Wall motion was normal; there were no regional wall motion abnormalities. 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  - Trivial mitral and pulmonic regurgitation. Mild tricuspid regurgitation.  Cardiac cath on 04/08/10 showed normal coronaries, minimal inferobasal wall hypokinesis with estimated EF 55%.  Carotid duplex on 01/31/12 showed no significant extracranial carotid artery stenosis, antegrade vertebral  artery flow.  CXR on 07/02/13 showed no active cardiopulmonary disease.  Preoperative labs noted.  PCP and cardiologist are aware of plans for surgery.  He is thought to be moderate risk from a cardiac standpoint.  He still admits to ETOH use, so is at increased risk for DT's.  LFTs and PLT count are WNL. If no acute changes then anticipate that he can proceed as planned.  [facility] Short Stay Center/Anesthesiology Phone [phone] 07/03/2013 1:29 PM

## 2013-07-03 NOTE — Telephone Encounter (Signed)
Received a call from [facility] at Pre-[facility]mit to state that patient was Positive for MRSA and Staph.  Per protocol Bactroban 2% escribed to patient's pharmacy.  Called to speak to patient however he was unavailable so I spoke with Alita Chyle (who is listed on patients HIPPA) and updated her on positive cultures and on prescription being sent in to pharmacy.  She states understanding, agreeable with plan, and all questions answered at this time.

## 2013-07-11 MED ORDER — CEFAZOLIN SODIUM-DEXTROSE 2-3 GM-% IV SOLR
2.0000 g | INTRAVENOUS | Status: AC
  Administered 2013-07-12: 2 g via INTRAVENOUS
  Filled 2013-07-11: qty 50

## 2013-07-11 MED ORDER — HEPARIN SODIUM (PORCINE) 5000 UNIT/ML IJ SOLN
5000.0000 [IU] | Freq: Once | INTRAMUSCULAR | Status: AC
  Administered 2013-07-12: 5000 [IU] via SUBCUTANEOUS
  Filled 2013-07-11: qty 1

## 2013-07-11 NOTE — H&P (Signed)
Steven Frey.   MRN:  [id number]   Description: 57 year old male  Provider: Adin Hector, MD  Department: [facility]        Diagnoses    Left inguinal hernia    -  Primary    550.90            Current Vitals - Last Recorded    BP Pulse Temp(Src) Resp Ht Wt    140/90 68 98.3 F (36.8 C) (Temporal) 14 5\' 8"  (1.727 m) 169 lb 9.6 oz (76.93 kg)       BMI 25.79 kg/m2                 History and Physical   Adin Hector, MD   Status: Signed                        HPI Steven Frey. is a 57 y.o. male.  He returns to see me for consideration of repair of a symptomatic left inguinal hernia. His primary care physician is Dr. Beverlee Nims at [facility] family practice. His cardiologist is Dr. Thompson Grayer at [facility].   I last saw Steven Frey in the office on 11/01/2012.This patient has a complex past medical history with chronic pancreatitis in the setting of alcohol abuse, cirrhosis, possible hepatocellular carcinoma, seizure disorder, prior stroke with spastic right hemiparesis. , depression. He has undergone pancreatic ductal stenting at [facility]. He had an out of hospital VF arrest in June 2011 in the setting of heavy EtOH abuse and electrolyte depletion. He was resuscitated. He had QT prolongation during cooling that eventually resolved. He had prolonged hospitalization with anoxic brain injury. Cardiac catheterization June 2011 showed normal coronaries, EF [id number], grade 2 diastolic dysfunction. He refused to wear a life vest. Thought to be a poor ICD candidate. Saw Dr. Caryl Comes and Dr. Rayann Heman. Also has hypertension, diabetes, ongoing tobacco abuse.   After my initial evaluation,  I requested cardiac clearance. He saw Dr. Rayann Heman on 03/28/2013, and he confirmed his multiple CAD risk factors. A cardiology nuclear medicine study was performed on 04/03/2013. This was a normal stress nuclear study with ejection fraction 58%, normal LV function, normal wall motion.  Patient states  Dr. Rayann Heman has told him he can have his hernia surgery. We will need a written or fax to confirm  that but it seems logical.   He says his hernia bothers him. He says he is ready to have this repaired. He is here today with his brother.       Past Medical History   Diagnosis  Date   .  Angioedema         rx requiring intubation/vent support suspected secondary to ACE1 (but also on zithromax) 5/09 hospitalization    .  Smoker     .  ETOH abuse     .  Chronic pancreatitis         (10/08 hops). admx 02/2010 pseudocyst aspirated during admxn. pancreatic dual stent placement at [facility] 11/11   .  Cirrhosis  2011       due to ETOH with recent hepatic abcess and possible HCC.    .  Gastritis         alcohol induced   .  Depression         no hx of meds   .  VF (ventricular fibrillation)  2011       arrest 6/11. successfully rescucitated  w/prolinged hospitalization due to respitatory failure. QT prolongation during cooling, now resolved   .  History of stroke  2011       reports it was caused by pain from pancreatitis- caused heart to stop and stroke    .  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  Anxiety     .  Seizures  2011   .  Edema     .  Abscess of liver(572.0)     .  Anoxic brain damage     .  Anemia     .  Hypertension  2011   .  Arthritis     .  Chronic pain  2011   .  Diabetes mellitus  2013   .  Hyperlipidemia  2011   .  Inguinal hernia  2011   .  COPD (chronic obstructive pulmonary disease)     .  GERD (gastroesophageal reflux disease)     .  Stroke           Past Surgical History   Procedure  Laterality  Date   .  Right wrist orif    1976       for fx   .  Pancreatic stent placement       .  Inguinal hernia repair    1960       right         Family History   Problem  Relation  Age of Onset   .  Coronary artery disease  Neg Hx     .  Colon cancer  Neg Hx     .  Alcohol abuse  Mother     .  Alcohol  abuse  Father     .  Alcohol abuse       .  Early death  Mother     .  Diabetes  Sister     .  Hyperlipidemia  Sister     .  Hypertension  Sister     .  Prostate cancer  Father  61   .  Breast cancer  Paternal Grandmother          Social History History   Substance Use Topics   .  Smoking status:  Current Every Day Smoker -- 0.50 packs/day       Types:  Cigarettes   .  Smokeless tobacco:  Former Systems developer       Quit date:  05/26/2010   .  Alcohol Use:  Yes         Comment: hx of alcohol abuse, reports drinking 2-3 liquor drinks per day         Allergies   Allergen  Reactions   .  Azithromycin  Swelling       Throat swelling, body swelling   .  Lisinopril  Other (See Comments)       REACTION: facial/neck edema         Current Outpatient Prescriptions   Medication  Sig  Dispense  Refill   .  carvedilol (COREG) 3.125 MG tablet  Take 1 tablet (3.125 mg total) by mouth 2 (two) times daily with a meal.   180 tablet   0   .  CREON 12000 UNITS CPEP  TAKE ONE CAPSULE BY MOUTH EVERY DAY   30 capsule   5   .  folic acid (FOLVITE) 1 MG tablet  Take 1 tablet (1 mg total) by mouth  daily.   90 tablet   3   .  ibuprofen (ADVIL,MOTRIN) 100 MG tablet  Take 100 mg by mouth every 6 (six) hours as needed for fever.         Marland Kitchen  LORazepam (ATIVAN) 1 MG tablet  Take 1 tablet (1 mg total) by mouth 2 (two) times daily as needed for anxiety.   60 tablet   2   .  OnabotulinumtoxinA (BOTOX IJ)  Inject as directed.         .  thiamine (VITAMIN B-1) 100 MG tablet  Take 1 tablet (100 mg total) by mouth daily.   90 tablet   3   .  tiZANidine (ZANAFLEX) 4 MG tablet  Take 1 tablet (4 mg total) by mouth every 6 (six) hours as needed.   120 tablet   5   .  vitamin B-12 (CYANOCOBALAMIN) 1000 MCG tablet  Take 1 tablet (1,000 mcg total) by mouth daily.   90 tablet   3            Review of Systems   Constitutional: Negative for fever, chills and unexpected weight change.        He has chronic difficulties  related to his stroke and is not very active.  HENT: Negative for hearing loss, congestion, sore throat, trouble swallowing and voice change.   Eyes: Negative for visual disturbance.  Respiratory: Negative for cough and wheezing.   Cardiovascular: Positive for chest pain. Negative for palpitations and leg swelling.        Occasional shortness of breath and chest pain  with exertion which are short lived.  Gastrointestinal: Negative for nausea, vomiting, abdominal pain, diarrhea, constipation, blood in stool, abdominal distention, anal bleeding and rectal pain.  Genitourinary: Negative for frequency, hematuria, flank pain and difficulty urinating.  Musculoskeletal: Positive for gait problem. Negative for arthralgias.  Skin: Negative for rash and wound.  Neurological: Positive for speech difficulty and weakness. Negative for seizures, syncope and headaches.  Hematological: Negative for adenopathy. Does not bruise/bleed easily.  Psychiatric/Behavioral: Negative for confusion.      Blood pressure 140/90, pulse 68, temperature 98.3 F (36.8 C), temperature source Temporal, resp. rate 14, height 5\' 8"  (1.727 m), weight 169 lb 9.6 oz (76.93 kg).   Physical Exam  Constitutional: He is oriented to person, place, and time. He appears well-nourished. No distress.  Alert and cooperative. Minimal speech impediment. Appropriate answers to questions.  HENT:   Head: Normocephalic.   Nose: Nose normal.   Mouth/Throat: No oropharyngeal exudate.  Eyes: Conjunctivae and EOM are normal. Pupils are equal, round, and reactive to light. Right eye exhibits no discharge. Left eye exhibits no discharge. No scleral icterus.  Neck: Normal range of motion. Neck supple. No JVD present. No tracheal deviation present. No thyromegaly present.  Cardiovascular: Normal rate, regular rhythm, normal heart sounds and intact distal pulses.    No murmur heard. Regular rate and rhythm. No murmurs, rubs, or gallops.   Pulmonary/Chest: Effort normal and breath sounds normal. No stridor. No respiratory distress. He has no wheezes. He has no rales. He exhibits no tenderness.  Abdominal: Soft. Bowel sounds are normal. He exhibits no distension and no mass. There is no tenderness. There is no rebound and no guarding.  Genitourinary:  Large left inguinal hernia, mostly reducible when supine. Scar in the right groin. No evidence of recurrent hernia on the right. Penis scrotum and testes normal.  Musculoskeletal: He exhibits no edema and no tenderness.  Atrophy  and right hand contractures with spasticity.  Lymphadenopathy:    He has no cervical adenopathy.  Neurological: He is alert and oriented to person, place, and time. He has normal reflexes. Coordination normal.  Mild right upper and right lower extremity  weakness.  Skin: Skin is warm and dry. No rash noted. He is not diaphoretic. No erythema. No pallor.  Psychiatric: He has a normal mood and affect. His behavior is normal. Judgment and thought content normal.      Data Reviewed My old notes. [facility] cardiology notes. Nuclear medicine study.   Assessment     Left inguinal hernia with episodes of possible incarceration & obstruction. elective repair is indicated to prevent a strangulation event.    History right inguinal hernia repair as a child, no evidence of recurrence on physical exam.    Will need cardiac clearance preop. This may be essentially complete   Tobacco abuse    Seizure disorder    Hypertension    Diabetes mellitus type 2    Chronic arm pain, right    History chronic pancreatitis and alcohol abuse, status post PD duct stenting at [facility]    History of cirrhosis    History CVA with residual right side hemiplegia    Depression    History of VF arrest and anoxic brain injury, June 2011.       Plan    Once we obtain a formal cardiac clearance note, we'll proceed with scheduling of open repair of his left  inguinal hernia with mesh. We will do this at [facility]. Probably will observe overnight for safety's sake because of his numerous comorbidities.   I discussed the indications, details, techniques, and numerous risks of the surgery with the patient and his brother. They're aware of the problems of bleeding, infection, recurrence of the hernia, nerve damage with chronic pain or numbness, injury to  the testicle, injury to other adjacent organs. They are aware of possible cardiac, neurologic, pulmonary or thromboembolic problems. All their questions are answered. They understand all these issues. They agree with this plan.          Edsel Petrin. Dalbert Batman, M.D., [facility] General and Minimally invasive Surgery Breast and Colorectal Surgery Office:   [phone] Pager:   [phone]

## 2013-07-12 ENCOUNTER — Encounter (HOSPITAL_COMMUNITY): Payer: Self-pay | Admitting: Vascular Surgery

## 2013-07-12 ENCOUNTER — Encounter (HOSPITAL_COMMUNITY)
Admission: RE | Disposition: A | Discharge status: Home / Self Care | Payer: Self-pay | Source: Ambulatory Visit | Attending: General Surgery

## 2013-07-12 ENCOUNTER — Ambulatory Visit (HOSPITAL_COMMUNITY): Payer: Medicare Other | Admitting: Anesthesiology

## 2013-07-12 ENCOUNTER — Observation Stay (HOSPITAL_COMMUNITY)
Admission: RE | Admit: 2013-07-12 | Discharge: 2013-07-14 | Disposition: A | Discharge status: Home / Self Care | Payer: Medicare Other | Source: Ambulatory Visit | Attending: General Surgery | Admitting: General Surgery

## 2013-07-12 ENCOUNTER — Encounter (HOSPITAL_COMMUNITY): Payer: Self-pay | Admitting: *Deleted

## 2013-07-12 DIAGNOSIS — J449 Chronic obstructive pulmonary disease, unspecified: Secondary | ICD-10-CM | POA: Insufficient documentation

## 2013-07-12 DIAGNOSIS — E119 Type 2 diabetes mellitus without complications: Secondary | ICD-10-CM | POA: Diagnosis not present

## 2013-07-12 DIAGNOSIS — H5310 Unspecified subjective visual disturbances: Secondary | ICD-10-CM

## 2013-07-12 DIAGNOSIS — K219 Gastro-esophageal reflux disease without esophagitis: Secondary | ICD-10-CM | POA: Insufficient documentation

## 2013-07-12 DIAGNOSIS — K4090 Unilateral inguinal hernia, without obstruction or gangrene, not specified as recurrent: Principal | ICD-10-CM | POA: Insufficient documentation

## 2013-07-12 DIAGNOSIS — M79609 Pain in unspecified limb: Secondary | ICD-10-CM

## 2013-07-12 DIAGNOSIS — K861 Other chronic pancreatitis: Secondary | ICD-10-CM | POA: Insufficient documentation

## 2013-07-12 DIAGNOSIS — I10 Essential (primary) hypertension: Secondary | ICD-10-CM

## 2013-07-12 DIAGNOSIS — Z8673 Personal history of transient ischemic attack (TIA), and cerebral infarction without residual deficits: Secondary | ICD-10-CM | POA: Diagnosis not present

## 2013-07-12 DIAGNOSIS — G931 Anoxic brain damage, not elsewhere classified: Secondary | ICD-10-CM

## 2013-07-12 DIAGNOSIS — F1010 Alcohol abuse, uncomplicated: Secondary | ICD-10-CM

## 2013-07-12 DIAGNOSIS — K750 Abscess of liver: Secondary | ICD-10-CM

## 2013-07-12 DIAGNOSIS — F17200 Nicotine dependence, unspecified, uncomplicated: Secondary | ICD-10-CM

## 2013-07-12 DIAGNOSIS — R000 Tachycardia, unspecified: Secondary | ICD-10-CM

## 2013-07-12 DIAGNOSIS — Z8674 Personal history of sudden cardiac arrest: Secondary | ICD-10-CM | POA: Insufficient documentation

## 2013-07-12 DIAGNOSIS — R609 Edema, unspecified: Secondary | ICD-10-CM

## 2013-07-12 DIAGNOSIS — J4489 Other specified chronic obstructive pulmonary disease: Secondary | ICD-10-CM | POA: Insufficient documentation

## 2013-07-12 DIAGNOSIS — F1020 Alcohol dependence, uncomplicated: Secondary | ICD-10-CM | POA: Insufficient documentation

## 2013-07-12 DIAGNOSIS — K7030 Alcoholic cirrhosis of liver without ascites: Secondary | ICD-10-CM | POA: Insufficient documentation

## 2013-07-12 DIAGNOSIS — E785 Hyperlipidemia, unspecified: Secondary | ICD-10-CM

## 2013-07-12 DIAGNOSIS — F411 Generalized anxiety disorder: Secondary | ICD-10-CM

## 2013-07-12 DIAGNOSIS — J069 Acute upper respiratory infection, unspecified: Secondary | ICD-10-CM

## 2013-07-12 DIAGNOSIS — F528 Other sexual dysfunction not due to a substance or known physiological condition: Secondary | ICD-10-CM

## 2013-07-12 DIAGNOSIS — I469 Cardiac arrest, cause unspecified: Secondary | ICD-10-CM

## 2013-07-12 DIAGNOSIS — G8110 Spastic hemiplegia affecting unspecified side: Secondary | ICD-10-CM

## 2013-07-12 DIAGNOSIS — R569 Unspecified convulsions: Secondary | ICD-10-CM

## 2013-07-12 DIAGNOSIS — Z01810 Encounter for preprocedural cardiovascular examination: Secondary | ICD-10-CM

## 2013-07-12 DIAGNOSIS — D649 Anemia, unspecified: Secondary | ICD-10-CM

## 2013-07-12 DIAGNOSIS — E538 Deficiency of other specified B group vitamins: Secondary | ICD-10-CM

## 2013-07-12 DIAGNOSIS — F079 Unspecified personality and behavioral disorder due to known physiological condition: Secondary | ICD-10-CM

## 2013-07-12 HISTORY — DX: Type 2 diabetes mellitus without complications: E11.9

## 2013-07-12 HISTORY — PX: INGUINAL HERNIA REPAIR: SHX194

## 2013-07-12 HISTORY — PX: INSERTION OF MESH: SHX5868

## 2013-07-12 LAB — CBC
HCT: 45.4 % (ref 39.0–52.0)
Hemoglobin: 14.3 g/dL (ref 13.0–17.0)
MCH: 30.2 pg (ref 26.0–34.0)
MCHC: 31.5 g/dL (ref 30.0–36.0)
MCV: 95.8 fL (ref 78.0–100.0)
Platelets: 235 10*3/uL (ref 150–400)
RBC: 4.74 MIL/uL (ref 4.22–5.81)
RDW: 13.7 % (ref 11.5–15.5)
WBC: 10 10*3/uL (ref 4.0–10.5)

## 2013-07-12 LAB — GLUCOSE, CAPILLARY
Glucose-Capillary: 137 mg/dL — ABNORMAL HIGH (ref 70–99)
Glucose-Capillary: 122 mg/dL — ABNORMAL HIGH (ref 70–99)

## 2013-07-12 LAB — CREATININE, SERUM
Creatinine, Ser: 0.97 mg/dL (ref 0.50–1.35)
GFR calc Af Amer: >90 mL/min (ref >90)
GFR calc non Af Amer: >90 mL/min (ref >90)

## 2013-07-12 SURGERY — REPAIR, HERNIA, INGUINAL, ADULT
Anesthesia: General | Site: Groin | Laterality: Left | Wound class: Clean

## 2013-07-12 MED ORDER — CARVEDILOL 3.125 MG PO TABS
3.1250 mg | ORAL_TABLET | Freq: Two times a day (BID) | ORAL | Status: DC
  Administered 2013-07-12 – 2013-07-14 (×4): 3.125 mg via ORAL
  Filled 2013-07-12 (×6): qty 1

## 2013-07-12 MED ORDER — NEOSTIGMINE METHYLSULFATE 1 MG/ML IJ SOLN
INTRAMUSCULAR | Status: DC | PRN
  Administered 2013-07-12: 5 mg via INTRAVENOUS

## 2013-07-12 MED ORDER — DEXTROSE 5 % IV SOLN
INTRAVENOUS | Status: DC | PRN
  Administered 2013-07-12: 10:00:00 via INTRAVENOUS

## 2013-07-12 MED ORDER — ONDANSETRON HCL 4 MG/2ML IJ SOLN: 4.0000 mg | Freq: Four times a day (QID) | INTRAMUSCULAR | Status: DC | PRN

## 2013-07-12 MED ORDER — CEFAZOLIN SODIUM-DEXTROSE 2-3 GM-% IV SOLR
2.0000 g | Freq: Three times a day (TID) | INTRAVENOUS | Status: AC
  Administered 2013-07-12 – 2013-07-13 (×3): 2 g via INTRAVENOUS
  Filled 2013-07-12 (×3): qty 50

## 2013-07-12 MED ORDER — ARTIFICIAL TEARS OP OINT
TOPICAL_OINTMENT | OPHTHALMIC | Status: DC | PRN
  Administered 2013-07-12: 1 via OPHTHALMIC

## 2013-07-12 MED ORDER — LEVETIRACETAM 500 MG PO TABS
1000.0000 mg | ORAL_TABLET | Freq: Two times a day (BID) | ORAL | Status: DC
  Administered 2013-07-12 – 2013-07-14 (×4): 1000 mg via ORAL
  Filled 2013-07-12 (×5): qty 2

## 2013-07-12 MED ORDER — VITAMIN B-1 100 MG PO TABS
100.0000 mg | ORAL_TABLET | Freq: Every day | ORAL | Status: DC
  Administered 2013-07-13 – 2013-07-14 (×2): 100 mg via ORAL
  Filled 2013-07-12 (×2): qty 1

## 2013-07-12 MED ORDER — ENOXAPARIN SODIUM 40 MG/0.4ML ~~LOC~~ SOLN
40.0000 mg | SUBCUTANEOUS | Status: DC
  Administered 2013-07-13 – 2013-07-14 (×2): 40 mg via SUBCUTANEOUS
  Filled 2013-07-12 (×3): qty 0.4

## 2013-07-12 MED ORDER — SUCCINYLCHOLINE CHLORIDE 20 MG/ML IJ SOLN
INTRAMUSCULAR | Status: DC | PRN
  Administered 2013-07-12: 100 mg via INTRAVENOUS

## 2013-07-12 MED ORDER — FENTANYL CITRATE 0.05 MG/ML IJ SOLN
INTRAMUSCULAR | Status: DC | PRN
  Administered 2013-07-12 (×3): 50 ug via INTRAVENOUS

## 2013-07-12 MED ORDER — 0.9 % SODIUM CHLORIDE (POUR BTL) OPTIME
TOPICAL | Status: DC | PRN
  Administered 2013-07-12: 1000 mL

## 2013-07-12 MED ORDER — MORPHINE SULFATE 2 MG/ML IJ SOLN
2.0000 mg | INTRAMUSCULAR | Status: DC | PRN
  Administered 2013-07-12: 2 mg via INTRAVENOUS
  Filled 2013-07-12: qty 1

## 2013-07-12 MED ORDER — LORAZEPAM 1 MG PO TABS: 1.0000 mg | ORAL_TABLET | Freq: Two times a day (BID) | ORAL | Status: DC | PRN

## 2013-07-12 MED ORDER — ROCURONIUM BROMIDE 100 MG/10ML IV SOLN
INTRAVENOUS | Status: DC | PRN
  Administered 2013-07-12: 20 mg via INTRAVENOUS
  Administered 2013-07-12: 5 mg via INTRAVENOUS
  Administered 2013-07-12: 30 mg via INTRAVENOUS

## 2013-07-12 MED ORDER — BUPIVACAINE-EPINEPHRINE 0.5% -1:200000 IJ SOLN
INTRAMUSCULAR | Status: DC | PRN
  Administered 2013-07-12: 30 mL

## 2013-07-12 MED ORDER — VITAMIN B-12 1000 MCG PO TABS
1000.0000 ug | ORAL_TABLET | Freq: Every day | ORAL | Status: DC
  Administered 2013-07-12 – 2013-07-14 (×3): 1000 ug via ORAL
  Filled 2013-07-12 (×3): qty 1

## 2013-07-12 MED ORDER — HYDROMORPHONE HCL PF 1 MG/ML IJ SOLN
INTRAMUSCULAR | Status: AC
  Administered 2013-07-12: 0.5 mg
  Filled 2013-07-12: qty 1

## 2013-07-12 MED ORDER — SODIUM CHLORIDE 0.9 % IV SOLN
INTRAVENOUS | Status: DC
  Administered 2013-07-12 – 2013-07-13 (×2): via INTRAVENOUS

## 2013-07-12 MED ORDER — LACTATED RINGERS IV SOLN
INTRAVENOUS | Status: DC | PRN
  Administered 2013-07-12: 09:00:00 via INTRAVENOUS

## 2013-07-12 MED ORDER — ONDANSETRON HCL 4 MG PO TABS: 4.0000 mg | ORAL_TABLET | Freq: Four times a day (QID) | ORAL | Status: DC | PRN

## 2013-07-12 MED ORDER — PANCRELIPASE (LIP-PROT-AMYL) 12000-38000 UNITS PO CPEP
1.0000 | ORAL_CAPSULE | Freq: Every day | ORAL | Status: DC
  Administered 2013-07-12 – 2013-07-14 (×3): 1 via ORAL
  Filled 2013-07-12 (×3): qty 1

## 2013-07-12 MED ORDER — GLYCOPYRROLATE 0.2 MG/ML IJ SOLN
INTRAMUSCULAR | Status: DC | PRN
  Administered 2013-07-12: .8 mg via INTRAVENOUS

## 2013-07-12 MED ORDER — BUPIVACAINE-EPINEPHRINE (PF) 0.5% -1:200000 IJ SOLN
INTRAMUSCULAR | Status: AC
  Filled 2013-07-12: qty 10

## 2013-07-12 MED ORDER — PROPOFOL 10 MG/ML IV BOLUS
INTRAVENOUS | Status: DC | PRN
  Administered 2013-07-12: 200 mg via INTRAVENOUS

## 2013-07-12 MED ORDER — FOLIC ACID 1 MG PO TABS
1.0000 mg | ORAL_TABLET | Freq: Every day | ORAL | Status: DC
  Administered 2013-07-13 – 2013-07-14 (×2): 1 mg via ORAL
  Filled 2013-07-12 (×2): qty 1

## 2013-07-12 MED ORDER — OXYCODONE-ACETAMINOPHEN 5-325 MG PO TABS
1.0000 | ORAL_TABLET | ORAL | Status: DC | PRN
  Administered 2013-07-12 – 2013-07-13 (×6): 2 via ORAL
  Administered 2013-07-13: 1 via ORAL
  Administered 2013-07-14 (×2): 2 via ORAL
  Filled 2013-07-12 (×9): qty 2

## 2013-07-12 MED ORDER — LIDOCAINE HCL (CARDIAC) 20 MG/ML IV SOLN: INTRAVENOUS | Status: DC | PRN

## 2013-07-12 MED ORDER — LACTATED RINGERS IV SOLN
INTRAVENOUS | Status: DC
  Administered 2013-07-12: 09:00:00 via INTRAVENOUS

## 2013-07-12 MED ORDER — HYDROMORPHONE HCL PF 1 MG/ML IJ SOLN
0.2500 mg | INTRAMUSCULAR | Status: DC | PRN
  Administered 2013-07-12: 0.5 mg via INTRAVENOUS

## 2013-07-12 MED ORDER — TIZANIDINE HCL 4 MG PO TABS
4.0000 mg | ORAL_TABLET | Freq: Four times a day (QID) | ORAL | Status: DC | PRN
  Administered 2013-07-12: 4 mg via ORAL
  Filled 2013-07-12: qty 1

## 2013-07-12 MED ORDER — LIDOCAINE HCL (CARDIAC) 20 MG/ML IV SOLN
INTRAVENOUS | Status: DC | PRN
  Administered 2013-07-12: 60 mg via INTRAVENOUS

## 2013-07-12 SURGICAL SUPPLY — 47 items
BLADE SURG 10 STRL SS (BLADE) ×2 IMPLANT
BLADE SURG 15 STRL LF DISP TIS (BLADE) ×1 IMPLANT
BLADE SURG 15 STRL SS (BLADE) ×1
CHLORAPREP W/TINT 26ML (MISCELLANEOUS) ×2 IMPLANT
CLOTH BEACON ORANGE TIMEOUT ST (SAFETY) ×2 IMPLANT
COVER SURGICAL LIGHT HANDLE (MISCELLANEOUS) ×2 IMPLANT
DERMABOND ADHESIVE PROPEN (GAUZE/BANDAGES/DRESSINGS) ×1
DERMABOND ADVANCED (GAUZE/BANDAGES/DRESSINGS) ×1
DERMABOND ADVANCED .7 DNX12 (GAUZE/BANDAGES/DRESSINGS) ×1 IMPLANT
DERMABOND ADVANCED .7 DNX6 (GAUZE/BANDAGES/DRESSINGS) ×1 IMPLANT
DRAIN PENROSE 1/2X12 LTX STRL (WOUND CARE) ×2 IMPLANT
DRAPE LAPAROTOMY TRNSV 102X78 (DRAPE) ×2 IMPLANT
DRAPE UTILITY 15X26 W/TAPE STR (DRAPE) ×4 IMPLANT
ELECT CAUTERY BLADE 6.4 (BLADE) ×2 IMPLANT
ELECT REM PT RETURN 9FT ADLT (ELECTROSURGICAL) ×2
ELECTRODE REM PT RTRN 9FT ADLT (ELECTROSURGICAL) ×1 IMPLANT
GLOVE BIO SURGEON STRL SZ 6.5 (GLOVE) ×2 IMPLANT
GLOVE BIO SURGEON STRL SZ7.5 (GLOVE) ×2 IMPLANT
GLOVE BIOGEL PI IND STRL 7.0 (GLOVE) ×2 IMPLANT
GLOVE BIOGEL PI IND STRL 7.5 (GLOVE) ×1 IMPLANT
GLOVE BIOGEL PI INDICATOR 7.0 (GLOVE) ×2
GLOVE BIOGEL PI INDICATOR 7.5 (GLOVE) ×1
GLOVE EUDERMIC 7 POWDERFREE (GLOVE) ×2 IMPLANT
GOWN STRL NON-REIN LRG LVL3 (GOWN DISPOSABLE) ×4 IMPLANT
GOWN STRL REIN XL XLG (GOWN DISPOSABLE) ×2 IMPLANT
KIT BASIN OR (CUSTOM PROCEDURE TRAY) ×2 IMPLANT
KIT ROOM TURNOVER OR (KITS) ×2 IMPLANT
MESH ULTRAPRO 3X6 7.6X15CM (Mesh General) ×2 IMPLANT
NEEDLE HYPO 25GX1X1/2 BEV (NEEDLE) ×2 IMPLANT
NS IRRIG 1000ML POUR BTL (IV SOLUTION) ×2 IMPLANT
PACK SURGICAL SETUP 50X90 (CUSTOM PROCEDURE TRAY) ×2 IMPLANT
PAD ARMBOARD 7.5X6 YLW CONV (MISCELLANEOUS) ×2 IMPLANT
PENCIL BUTTON HOLSTER BLD 10FT (ELECTRODE) ×2 IMPLANT
SPONGE LAP 18X18 X RAY DECT (DISPOSABLE) ×2 IMPLANT
SUT MNCRL AB 4-0 PS2 18 (SUTURE) ×2 IMPLANT
SUT PROLENE 2 0 CT2 30 (SUTURE) ×6 IMPLANT
SUT SILK 2 0 (SUTURE) ×1
SUT SILK 2-0 18XBRD TIE 12 (SUTURE) ×1 IMPLANT
SUT VIC AB 2-0 CT1 27 (SUTURE) ×1
SUT VIC AB 2-0 CT1 TAPERPNT 27 (SUTURE) ×1 IMPLANT
SUT VIC AB 3-0 SH 27 (SUTURE) ×1
SUT VIC AB 3-0 SH 27XBRD (SUTURE) ×1 IMPLANT
SUT VICRYL AB 2 0 TIES (SUTURE) ×2 IMPLANT
SYR BULB 3OZ (MISCELLANEOUS) ×2 IMPLANT
SYR CONTROL 10ML LL (SYRINGE) ×2 IMPLANT
TOWEL OR 17X24 6PK STRL BLUE (TOWEL DISPOSABLE) ×2 IMPLANT
TOWEL OR 17X26 10 PK STRL BLUE (TOWEL DISPOSABLE) ×2 IMPLANT

## 2013-07-12 NOTE — Preoperative (Signed)
Beta Blockers   Reason not to administer Beta Blockers:Coreg this morning

## 2013-07-12 NOTE — Anesthesia Preprocedure Evaluation (Addendum)
Anesthesia Evaluation  Patient identified by MRN, date of birth, ID band Patient awake    Reviewed: Allergy & Precautions, H&P , NPO status , Patient's Chart, lab work & pertinent test results, reviewed documented beta blocker date and time   Airway Mallampati: I TM Distance: >3 FB Neck ROM: Full    Dental  (+) Teeth Intact, Missing and Dental Advisory Given   Pulmonary shortness of breath, pneumonia -, COPD breath sounds clear to auscultation        Cardiovascular hypertension, Pt. on home beta blockers + dysrhythmias Ventricular Tachycardia Rhythm:Regular Rate:Normal  Hx VF arrest in past; pt allegedly refused AICD/pacer   Neuro/Psych Seizures -, Well Controlled,  Right side contracted fingers.  Slight speech delays. CVA, Residual Symptoms    GI/Hepatic Neg liver ROS, GERD-  Controlled and Medicated,  Endo/Other  diabetes, Well Controlled, Type 2  Renal/GU negative Renal ROS     Musculoskeletal   Abdominal   Peds  Hematology negative hematology ROS (+)   Anesthesia Other Findings Full beard. Assessment originally done at 0915.  Reproductive/Obstetrics                       Anesthesia Physical Anesthesia Plan  ASA: III  Anesthesia Plan: General   Post-op Pain Management:    Induction: Intravenous  Airway Management Planned: LMA  Additional Equipment:   Intra-op Plan:   Post-operative Plan: Extubation in OR  Informed Consent: I have reviewed the patients History and Physical, chart, labs and discussed the procedure including the risks, benefits and alternatives for the proposed anesthesia with the patient or authorized representative who has indicated his/her understanding and acceptance.   Dental advisory given  Plan Discussed with: CRNA, Anesthesiologist and Surgeon  Anesthesia Plan Comments:        Anesthesia Quick Evaluation

## 2013-07-12 NOTE — Op Note (Signed)
Patient Name:           Steven Frey.   Date of Surgery:        07/12/2013  Pre op Diagnosis:      Left inguinal hernia  Post op Diagnosis:    Direct left inguinal hernia  Procedure:                 Open repair left inguinal hernia with mesh  Surgeon:                     Edsel Petrin. Dalbert Batman, M.D., FACS  Assistant:                      RN  Operative Indications:   Steven Frey. is a 57 y.o. male. He returns for repair of a symptomatic left inguinal hernia. His primary care physician is Dr. Beverlee Nims at [facility] family practice. His cardiologist is Dr. Thompson Grayer at [facility].  I  saw Steven Frey in the office on 11/01/2012, and again on 06/12/2013..This patient has a complex past medical history with chronic pancreatitis in the setting of alcohol abuse, cirrhosis, possible hepatocellular carcinoma, seizure disorder, prior stroke with spastic right hemiparesis. , depression. He has undergone pancreatic ductal stenting at [facility]. He had an out of hospital VF arrest in June 2011 in the setting of heavy EtOH abuse and electrolyte depletion. He was resuscitated. He had QT prolongation during cooling that eventually resolved. He had prolonged hospitalization with anoxic brain injury. Cardiac catheterization June 2011 showed normal coronaries, EF [id number], grade 2 diastolic dysfunction.  Thought to be a poor ICD candidate. Saw Dr. Caryl Comes and Dr. Rayann Heman. Also has hypertension, diabetes, ongoing tobacco abuse.  After my initial evaluation, I requested cardiac clearance. He saw Dr. Rayann Heman on 03/28/2013, and he confirmed his multiple CAD risk factors. A cardiology nuclear medicine study was performed on 04/03/2013. This was a normal stress nuclear study with ejection fraction 58%, normal LV function, normal wall motion. Patient states Dr. Rayann Heman has told him he can have his hernia surgery. Marland Kitchen He says he is ready to have this repaired.    Operative Findings:       The patient had a large direct left inguinal  hernia. There was no evidence of indirect left inguinal hernia. I could see the peritoneal edge just below the internal ring.  Procedure in Detail:          Following the induction of general endotracheal anesthesia the patient's lower abdomen left groin and genitalia were prepped and draped in a sterile fashion. Intravenous antibiotics were given. Surgical time out was performed. 0.5% Marcaine with epinephrine was used as a local infiltration anesthetic. A transverse incision was made in the left groin overlying the inguinal canal. Dissection was carried down to the external oblique. The external oblique fascia was incised in the direction of its fibers, opening up the external inguinal ring. Self-retaining retractors were placed. The cord structures were mobilized and encircled with a Penrose drain. The ilioinguinal nerve was intimately associated with the cord, and was traced back to its emergence from the muscles laterally, clamped, divided, and tied off with a 2-0 silk tie. The redundant nerve medially was excised. Cremasteric muscle fibers were skeletonized somewhat. He had a large direct hernia which was dissected away from the cord structures. The direct hernia was then reduced and oversewn with a running suture of 2-0 Vicryl. I inspected the cord and could see  the edge of the peritoneum. There was no evidence of indirect hernia. The floor of the inguinal canal was repaired and reinforced with an onlay graft of Ultra pro mesh. A 3" x 6" piece of mesh was brought to the operative field. It was trimmed at the corners conservatively to conform to the anatomy. The mesh was sutured in place with running sutures of 2-0 Prolene and interrupted mattress sutures of 2-0 Prolene. The mesh was sutured so as to generously overlap the fascia at the pubic tubercle, and then along the inguinal ligament inferiorly. Medially, superiorly, and superolaterally several mattress sutures of 2-0 Prolene were placed. The mesh was  incised laterally so as to wrap around the cord structures at the internal ring. The tails were overlapped laterally. A few other interrupted sutures were placed to secure the mesh. This provided very secure repair both medial and lateral to the internal ring but  Allowed an  adequate fingertip opening for the cord structures. Hemostasis was excellent. The wound was irrigated with saline. The external oblique was closed with running suture of 2-0 Vicryl placing the cord structures deep to the external oblique. Scarpa's fascia was closed with 3-0 Vicryl sutures and the skin closed with a running subcuticular suture of 4-0 Monocryl and Dermabond. The patient tolerated the procedure well and was taken to the recovery room in stable condition. EBL 10 cc. Counts correct. Complications none.     Edsel Petrin. Dalbert Batman, M.D., FACS General and Minimally Invasive Surgery Breast and Colorectal Surgery  07/12/2013 10:45 AM

## 2013-07-12 NOTE — Anesthesia Procedure Notes (Signed)
Procedure Name: Intubation Date/Time: 07/12/2013 9:37 AM Performed by: Terrill Mohr Pre-anesthesia Checklist: Patient identified, Emergency Drugs available, Suction available and Patient being monitored Patient Re-evaluated:Patient Re-evaluated prior to inductionOxygen Delivery Method: Circle system utilized Preoxygenation: Pre-oxygenation with 100% oxygen Intubation Type: IV induction Ventilation: Mask ventilation without difficulty Laryngoscope Size: Mac and 4 Grade View: Grade I Tube type: Oral Tube size: 7.5 mm Number of attempts: 1 Airway Equipment and Method: Stylet Placement Confirmation: ETT inserted through vocal cords under direct vision,  breath sounds checked- equal and bilateral and positive ETCO2 Secured at: 22 (cm at teeth) cm Tube secured with: Tape Dental Injury: Teeth and Oropharynx as per pre-operative assessment

## 2013-07-12 NOTE — Anesthesia Postprocedure Evaluation (Signed)
  Anesthesia Post-op Note  Patient: Steven Frey.  Procedure(s) Performed: Procedure(s): HERNIA REPAIR INGUINAL ADULT (Left) INSERTION OF MESH (Left)  Patient Location: PACU  Anesthesia Type:General  Level of Consciousness: awake  Airway and Oxygen Therapy: Patient Spontanous Breathing  Post-op Pain: mild  Post-op Assessment: Post-op Vital signs reviewed  Post-op Vital Signs: Reviewed  Complications: No apparent anesthesia complications

## 2013-07-12 NOTE — Transfer of Care (Signed)
Immediate Anesthesia Transfer of Care Note  Patient: Steven Frey.  Procedure(s) Performed: Procedure(s): HERNIA REPAIR INGUINAL ADULT (Left) INSERTION OF MESH (Left)  Patient Location: PACU  Anesthesia Type:General  Level of Consciousness: awake, alert  and patient cooperative  Airway & Oxygen Therapy: Patient Spontanous Breathing and Patient connected to nasal cannula oxygen  Post-op Assessment: Report given to PACU RN, Post -op Vital signs reviewed and stable and Patient moving all extremities  Post vital signs: Reviewed and stable  Complications: No apparent anesthesia complications

## 2013-07-12 NOTE — Interval H&P Note (Signed)
History and Physical Interval Note:  07/12/2013 8:42 AM  Steven Frey.  has presented today for surgery, with the diagnosis of left inguinal hernia   The goals and the various methods of treatment have been discussed with the patient and family. After consideration of risks, benefits and other options for treatment, the patient has consented to  Procedure(s): HERNIA REPAIR INGUINAL ADULT (Left) INSERTION OF MESH (Left) as a surgical intervention .  The patient's history has been reviewed, patient examined today, no change in status, stable for surgery.  I have reviewed the patient's chart and labs.  Questions were answered to the patient's satisfaction.     Steven Frey

## 2013-07-13 MED ORDER — OXYCODONE HCL 5 MG PO TABS: 10.0000 mg | ORAL_TABLET | Freq: Four times a day (QID) | ORAL | Status: DC | PRN

## 2013-07-13 MED ORDER — HYDROMORPHONE HCL PF 1 MG/ML IJ SOLN
1.0000 mg | INTRAMUSCULAR | Status: DC | PRN
  Administered 2013-07-13 (×2): 1 mg via INTRAVENOUS
  Filled 2013-07-13 (×2): qty 1

## 2013-07-13 NOTE — Progress Notes (Signed)
1 Day Post-Op  Subjective: PAIN POORLY CONTROLLED.  Objective: Vital signs in last 24 hours: Temp:  [97.5 F (36.4 C)-98.1 F (36.7 C)] 98.1 F (36.7 C) (10/04 1005) Pulse Rate:  [72-101] 79 (10/04 1005) Resp:  [8-19] 19 (10/04 1005) BP: (117-162)/(78-115) 117/78 mmHg (10/04 1005) SpO2:  [92 %-99 %] 92 % (10/04 1005) Weight:  [179 lb 11.2 oz (81.511 kg)] 179 lb 11.2 oz (81.511 kg) (10/04 0614) Last BM Date: 07/12/13  Intake/Output from previous day: 10/03 0701 - 10/04 0700 In: 1619.2 [I.V.:1519.2; IV Piggyback:100] Out: 1240 [Urine:1225; Blood:15] Intake/Output this shift:    Incision/Wound:C/D/I  Swelling noted.   Lab Results:   Recent Labs  07/12/13 1400  WBC 10.0  HGB 14.3  HCT 45.4  PLT 235   BMET  Recent Labs  07/12/13 1400  CREATININE 0.97   PT/INR No results found for this basename: LABPROT, INR,  in the last 72 hours ABG No results found for this basename: PHART, PCO2, PO2, HCO3,  in the last 72 hours  Studies/Results: No results found.  Anti-infectives: Anti-infectives   Start     Dose/Rate Route Frequency Ordered Stop   07/12/13 1800  ceFAZolin (ANCEF) IVPB 2 g/50 mL premix     2 g 100 mL/hr over 30 Minutes Intravenous 3 times per day 07/12/13 1254 07/13/13 2159   07/12/13 0600  ceFAZolin (ANCEF) IVPB 2 g/50 mL premix     2 g 100 mL/hr over 30 Minutes Intravenous On call to O.R. 07/11/13 1426 07/12/13 0940      Assessment/Plan: s/p Procedure(s): HERNIA REPAIR INGUINAL ADULT (Left) INSERTION OF MESH (Left) POOR PAIN CONTROL. WILL WORK ON TODAY AND DISCHARGE Sunday.  LOS: 1 day    Lourene Hoston A. 07/13/2013

## 2013-07-14 MED ORDER — OXYCODONE HCL 10 MG PO TABS: 10.0000 mg | ORAL_TABLET | Freq: Four times a day (QID) | ORAL | Status: DC | PRN

## 2013-07-14 NOTE — Progress Notes (Signed)
2 Days Post-Op  Subjective: PAIN BETTER CONTROLLED.  Objective: Vital signs in last 24 hours: Temp:  [98.1 F (36.7 C)-98.6 F (37 C)] 98.6 F (37 C) (10/05 [id number]) Pulse Rate:  [75-92] 75 (10/05 0637) Resp:  [18-20] 20 (10/05 0637) BP: (117-147)/(78-92) 132/83 mmHg (10/05 0637) SpO2:  [92 %-93 %] 93 % (10/05 0637) Last BM Date: 07/12/13  Intake/Output from previous day: 10/04 0701 - 10/05 0700 In: 290 [P.O.:240; IV Piggyback:50] Out: 1325 [Urine:1325] Intake/Output this shift: Total I/O In: 240 [P.O.:240] Out: -   Incision/Wound:C/D/I  Lab Results:   Recent Labs  07/12/13 1400  WBC 10.0  HGB 14.3  HCT 45.4  PLT 235   BMET  Recent Labs  07/12/13 1400  CREATININE 0.97   PT/INR No results found for this basename: LABPROT, INR,  in the last 72 hours ABG No results found for this basename: PHART, PCO2, PO2, HCO3,  in the last 72 hours  Studies/Results: No results found.  Anti-infectives: Anti-infectives   Start     Dose/Rate Route Frequency Ordered Stop   07/12/13 1800  ceFAZolin (ANCEF) IVPB 2 g/50 mL premix     2 g 100 mL/hr over 30 Minutes Intravenous 3 times per day 07/12/13 1254 07/13/13 1436   07/12/13 0600  ceFAZolin (ANCEF) IVPB 2 g/50 mL premix     2 g 100 mL/hr over 30 Minutes Intravenous On call to O.R. 07/11/13 1426 07/12/13 0940      Assessment/Plan: s/p Procedure(s): HERNIA REPAIR INGUINAL ADULT (Left) INSERTION OF MESH (Left) Discharge  LOS: 2 days    Steven Frey A. 07/14/2013

## 2013-07-14 NOTE — Discharge Summary (Signed)
Physician Discharge Summary  Patient ID: Steven Frey. MRN: [id number] DOB/AGE: 10/14/1955 57 y.o.  Admit date: 07/12/2013 Discharge date: 07/14/2013  Admission Diagnoses:LIH Patient Active Problem List   Diagnosis Date Noted  . Preoperative cardiovascular examination 03/28/2013  . Spastic hemiplegia affecting dominant side 02/25/2013  . Left inguinal hernia 10/30/2012  . Seizures 09/03/2012  . Diabetes mellitus, type 2 02/02/2012  . History of CVA (cerebrovascular accident) 01/30/2012  . Sinus tachycardia 10/26/2011  . Pre-operative cardiovascular examination 10/26/2011  . 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 ARM PAIN, RIGHT 11/02/2010  . ANXIETY 10/08/2010  . URI 08/24/2010  . LEG EDEMA 08/24/2010  . ABSCESS OF LIVER 08/13/2010  . ANOXIC BRAIN DAMAGE 07/07/2010  . CARDIAC ARREST 05/24/2010  . VITAMIN B12 DEFICIENCY 03/25/2010  . DYSLIPIDEMIA 03/15/2010  . ANEMIA 03/15/2010  . CIRRHOSIS, ALCOHOLIC 0000000  . CHRONIC PANCREATITIS 02/23/2010  . ERECTILE DYSFUNCTION 01/26/2009  . ALCOHOL ABUSE 01/26/2009  . VISUAL IMPAIRMENT 01/26/2009  . CIGARETTE SMOKER 03/10/2008  . HYPERTENSION, BENIGN 03/10/2008    Discharge Diagnoses: [facility] Active Problems:   Left inguinal hernia  Patient Active Problem List   Diagnosis Date Noted  . Preoperative cardiovascular examination 03/28/2013  . Spastic hemiplegia affecting dominant side 02/25/2013  . Left inguinal hernia 10/30/2012  . Seizures 09/03/2012  . Diabetes mellitus, type 2 02/02/2012  . History of CVA (cerebrovascular accident) 01/30/2012  . Sinus tachycardia 10/26/2011  . Pre-operative cardiovascular examination 10/26/2011  . 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 ARM PAIN, RIGHT 11/02/2010  . ANXIETY 10/08/2010  . URI 08/24/2010  . LEG EDEMA 08/24/2010  . ABSCESS OF LIVER 08/13/2010  . ANOXIC BRAIN DAMAGE 07/07/2010  . CARDIAC ARREST 05/24/2010  . VITAMIN B12 DEFICIENCY 03/25/2010  . DYSLIPIDEMIA 03/15/2010  .  ANEMIA 03/15/2010  . CIRRHOSIS, ALCOHOLIC 0000000  . CHRONIC PANCREATITIS 02/23/2010  . ERECTILE DYSFUNCTION 01/26/2009  . ALCOHOL ABUSE 01/26/2009  . VISUAL IMPAIRMENT 01/26/2009  . CIGARETTE SMOKER 03/10/2008  . HYPERTENSION, BENIGN 03/10/2008     Discharged Condition: good  Hospital Course: pain control an issue. meds adjusted and control better.  Other medical conditions stable.  Tolerating diet and ready for discharge.   Consults: None  Significant Diagnostic Studies: none  Treatments: surgery: repair [facility]   Discharge Exam: Blood pressure 132/83, pulse 75, temperature 98.6 F (37 C), temperature source Oral, resp. rate 20, weight 179 lb 11.2 oz (81.511 kg), SpO2 93.00%. Incision/Wound:clean dry intact. Left groin some swelling  Disposition: 01-Home or Self Care     Medication List    ASK your doctor about these medications       BOTOX IJ  Inject as directed.     carvedilol 3.125 MG tablet  Commonly known as:  COREG  Take 1 tablet (3.125 mg total) by mouth 2 (two) times daily with a meal.     folic acid 1 MG tablet  Commonly known as:  FOLVITE  Take 1 tablet (1 mg total) by mouth daily.     ibuprofen 100 MG tablet  Commonly known as:  ADVIL,MOTRIN  Take 100 mg by mouth every 6 (six) hours as needed for fever.     levETIRAcetam 1000 MG tablet  Commonly known as:  KEPPRA  Take 1,000 mg by mouth 2 (two) times daily.     lipase/protease/amylase 12000 UNITS Cpep capsule  Commonly known as:  CREON-12/PANCREASE  Take 1 capsule by mouth daily.     LORazepam 1 MG tablet  Commonly known as:  ATIVAN  Take 1  tablet (1 mg total) by mouth 2 (two) times daily as needed for anxiety.     mupirocin nasal ointment 2 %  Commonly known as:  BACTROBAN NASAL  Place into the nose 2 (two) times daily. Use half of tube in each nostril twice daily for five days prior to surgery.     thiamine 100 MG tablet  Commonly known as:  VITAMIN B-1  Take 1 tablet (100 mg total) by  mouth daily.     tiZANidine 4 MG tablet  Commonly known as:  ZANAFLEX  Take 1 tablet (4 mg total) by mouth every 6 (six) hours as needed.     vitamin B-12 1000 MCG tablet  Commonly known as:  CYANOCOBALAMIN  Take 1 tablet (1,000 mcg total) by mouth daily.           Follow-up Information   Follow up with Adin Hector, MD. Schedule an appointment as soon as possible for a visit in 3 weeks.   Specialty:  General Surgery   Contact information:   [address] [place] [address] [phone]       Signed: Turner Daniels. 07/14/2013, 9:29 AM

## 2013-07-16 ENCOUNTER — Encounter (HOSPITAL_COMMUNITY): Payer: Self-pay | Admitting: General Surgery

## 2013-07-25 ENCOUNTER — Other Ambulatory Visit: Payer: Self-pay | Admitting: Family Medicine

## 2013-07-25 ENCOUNTER — Telehealth (INDEPENDENT_AMBULATORY_CARE_PROVIDER_SITE_OTHER): Payer: Self-pay | Admitting: *Deleted

## 2013-07-25 ENCOUNTER — Other Ambulatory Visit (INDEPENDENT_AMBULATORY_CARE_PROVIDER_SITE_OTHER): Payer: Self-pay | Admitting: *Deleted

## 2013-07-25 MED ORDER — HYDROCODONE-ACETAMINOPHEN 5-325 MG PO TABS: 1.0000 | ORAL_TABLET | Freq: Four times a day (QID) | ORAL | Status: DC | PRN

## 2013-07-25 NOTE — Telephone Encounter (Signed)
Patient called to ask for another prescription of pain medication.  Patient denies any signs of infection, denies issues with BM's, denies issues with urination.  Spoke to patient regarding changing to per protocol Norco and patient is agreeable.  Wrote prescription for Norco 5/325mg  Take 1 tablet PO every 4-6 hours as needed for pain #30 no refills and signed by urgent office MD, Dr. Zella Richer.  Patient aware prescription is at the front and will have sister pick up.

## 2013-07-31 ENCOUNTER — Telehealth: Payer: Self-pay | Admitting: Family Medicine

## 2013-07-31 MED ORDER — LORAZEPAM 1 MG PO TABS: 1.0000 mg | ORAL_TABLET | Freq: Two times a day (BID) | ORAL | Status: DC | PRN

## 2013-07-31 NOTE — Telephone Encounter (Addendum)
Pt needs follow up appointment. He was also supposed to go to neurology, and I do not see that he ever went. I will leave an rx for a small amount of ativan for him at the front desk but he needs to be seen for any future refills. Please call pt to inform.  Leeanne Rio, MD

## 2013-07-31 NOTE — Telephone Encounter (Signed)
Pt called because he needs a refill on his lorazepam JW

## 2013-07-31 NOTE — Telephone Encounter (Signed)
Will fwd. To PCP for review (last OV 04/11/13) .[place] Pt needs OV

## 2013-08-01 NOTE — Telephone Encounter (Signed)
Notified patient.  Nikcole Eischeid, Loralyn Freshwater, [place]

## 2013-08-05 ENCOUNTER — Telehealth (INDEPENDENT_AMBULATORY_CARE_PROVIDER_SITE_OTHER): Payer: Self-pay

## 2013-08-05 ENCOUNTER — Ambulatory Visit (INDEPENDENT_AMBULATORY_CARE_PROVIDER_SITE_OTHER): Payer: Medicare Other | Admitting: General Surgery

## 2013-08-05 ENCOUNTER — Other Ambulatory Visit (INDEPENDENT_AMBULATORY_CARE_PROVIDER_SITE_OTHER): Payer: Self-pay

## 2013-08-05 ENCOUNTER — Encounter (INDEPENDENT_AMBULATORY_CARE_PROVIDER_SITE_OTHER): Payer: Self-pay | Admitting: General Surgery

## 2013-08-05 ENCOUNTER — Encounter (INDEPENDENT_AMBULATORY_CARE_PROVIDER_SITE_OTHER): Payer: Self-pay

## 2013-08-05 VITALS — BP 110/62 | HR 68 | Temp 97.4°F | Resp 16 | Ht 68.0 in | Wt 173.6 lb

## 2013-08-05 DIAGNOSIS — K4090 Unilateral inguinal hernia, without obstruction or gangrene, not specified as recurrent: Secondary | ICD-10-CM

## 2013-08-05 MED ORDER — HYDROCODONE-ACETAMINOPHEN 5-325 MG PO TABS: 1.0000 | ORAL_TABLET | Freq: Four times a day (QID) | ORAL | Status: DC | PRN

## 2013-08-05 NOTE — Telephone Encounter (Signed)
Message copied by Gweneth Fritter on Mon Aug 05, 2013  8:34 AM ------      Message from: Salvatore Marvel      Created: Tue Jul 30, 2013  3:20 PM      Regarding: Dr. Dalbert Batman 1st po appt      Contact: [phone]       Patient has [place] surgery on 07/12/13 and needing 2 weeks appointment, please could you schedule post-op appointment.            Thank you.       ------

## 2013-08-05 NOTE — Patient Instructions (Signed)
You have recovered from your left inguinal hernia surgery without any obvious complications.  You may resume normal activities without restriction after November 15.  Return to see Dr. Dalbert Batman as needed.

## 2013-08-05 NOTE — Telephone Encounter (Signed)
I called and offered an appointment for today at 10:45 and the pt can come.

## 2013-08-05 NOTE — Progress Notes (Signed)
Patient ID: [name]., male   DOB: 10/05/1956, 57 y.o.   MRN: [id number] History: Digital underwent open repair of left inguinal hernia with mesh on 07/12/2013. He is recovering uneventfully and without wound problems. He gets a little bit constipated sometimes but is doing okay.Still sore.  Exam: Patient is alert and in no distress. Abdomen is soft and nontender Left groin incision is healing normally. Tissues are fairly soft. Minimal fibrosis and swelling. Hernia repair intact. Penis scrotum and testes normal  Assessment: Left inguinal hernia with episodes of possible incarceration & obstruction. Uneventful recovery following open repair with mesh   History right inguinal hernia repair as a child, no evidence of recurrence on physical exam.   Tobacco abuse  Seizure disorder  Hypertension  Diabetes mellitus type 2  Chronic arm pain, right  History chronic pancreatitis and alcohol abuse, status post PD duct stenting at [facility]  History of cirrhosis  History CVA with residual right side hemiplegia  Depression  History of VF arrest and anoxic brain injury, June 2011.   Plan: Diet and activities discussed Return to see me as needed. At his request I gave him hydrocodone, 5/325, 25 tablets, no refill   Khaidyn Staebell M. Dalbert Batman, M.D., [facility] General and Minimally invasive Surgery Breast and Colorectal Surgery Office:   [phone] Pager:   [phone]

## 2013-09-02 ENCOUNTER — Other Ambulatory Visit: Payer: Self-pay | Admitting: Family Medicine

## 2013-09-02 ENCOUNTER — Other Ambulatory Visit: Payer: Self-pay | Admitting: Neurology

## 2013-09-11 ENCOUNTER — Telehealth: Payer: Self-pay | Admitting: Family Medicine

## 2013-09-11 MED ORDER — LORAZEPAM 1 MG PO TABS: 1.0000 mg | ORAL_TABLET | Freq: Two times a day (BID) | ORAL | Status: DC | PRN

## 2013-09-11 NOTE — Telephone Encounter (Signed)
I received a refill request on pt's lorazepam. We called him in October to tell him he needs an appointment, which he has scheduled on 12/10. Please call pt and stress the importance of keeping this appointment. I will send in another small quantity of ativan to last him through this appointment but he must be seen for any future refills.  Leeanne Rio, MD

## 2013-09-11 NOTE — Telephone Encounter (Signed)
Unable to reach patient at this time, will try again later.Steven Frey, [name]

## 2013-09-12 NOTE — Telephone Encounter (Signed)
appt 09/18/13. Javier Glazier, Gerrit Heck

## 2013-09-12 NOTE — Telephone Encounter (Signed)
Called pt again. LMVM to call back. Please tell pt, to schedule OV. See Dr.McIntyre's message. Thanks. Javier Glazier, Gerrit Heck

## 2013-09-18 ENCOUNTER — Ambulatory Visit (INDEPENDENT_AMBULATORY_CARE_PROVIDER_SITE_OTHER): Payer: Medicare Other | Admitting: Family Medicine

## 2013-09-18 ENCOUNTER — Emergency Department (HOSPITAL_COMMUNITY)
Admission: EM | Admit: 2013-09-18 | Discharge: 2013-09-18 | Disposition: A | Discharge status: Home / Self Care | Payer: Medicare Other | Attending: Emergency Medicine | Admitting: Emergency Medicine

## 2013-09-18 ENCOUNTER — Encounter: Payer: Self-pay | Admitting: Family Medicine

## 2013-09-18 ENCOUNTER — Encounter (HOSPITAL_COMMUNITY): Payer: Self-pay | Admitting: Emergency Medicine

## 2013-09-18 VITALS — BP 150/90 | HR 62 | Temp 97.6°F | Wt 171.0 lb

## 2013-09-18 DIAGNOSIS — F411 Generalized anxiety disorder: Secondary | ICD-10-CM | POA: Diagnosis not present

## 2013-09-18 DIAGNOSIS — Z8719 Personal history of other diseases of the digestive system: Secondary | ICD-10-CM | POA: Insufficient documentation

## 2013-09-18 DIAGNOSIS — G8110 Spastic hemiplegia affecting unspecified side: Secondary | ICD-10-CM | POA: Diagnosis not present

## 2013-09-18 DIAGNOSIS — Z8701 Personal history of pneumonia (recurrent): Secondary | ICD-10-CM | POA: Insufficient documentation

## 2013-09-18 DIAGNOSIS — D649 Anemia, unspecified: Secondary | ICD-10-CM | POA: Diagnosis not present

## 2013-09-18 DIAGNOSIS — F17200 Nicotine dependence, unspecified, uncomplicated: Secondary | ICD-10-CM | POA: Diagnosis not present

## 2013-09-18 DIAGNOSIS — E119 Type 2 diabetes mellitus without complications: Secondary | ICD-10-CM | POA: Diagnosis not present

## 2013-09-18 DIAGNOSIS — Z8739 Personal history of other diseases of the musculoskeletal system and connective tissue: Secondary | ICD-10-CM | POA: Insufficient documentation

## 2013-09-18 DIAGNOSIS — R569 Unspecified convulsions: Secondary | ICD-10-CM

## 2013-09-18 DIAGNOSIS — J441 Chronic obstructive pulmonary disease with (acute) exacerbation: Secondary | ICD-10-CM | POA: Insufficient documentation

## 2013-09-18 DIAGNOSIS — G40909 Epilepsy, unspecified, not intractable, without status epilepticus: Secondary | ICD-10-CM

## 2013-09-18 DIAGNOSIS — R61 Generalized hyperhidrosis: Secondary | ICD-10-CM | POA: Diagnosis not present

## 2013-09-18 DIAGNOSIS — Z79899 Other long term (current) drug therapy: Secondary | ICD-10-CM | POA: Diagnosis not present

## 2013-09-18 DIAGNOSIS — I10 Essential (primary) hypertension: Secondary | ICD-10-CM | POA: Diagnosis not present

## 2013-09-18 DIAGNOSIS — Z8673 Personal history of transient ischemic attack (TIA), and cerebral infarction without residual deficits: Secondary | ICD-10-CM | POA: Insufficient documentation

## 2013-09-18 LAB — COMPREHENSIVE METABOLIC PANEL
ALT: 17 U/L (ref 0–53)
AST: 18 U/L (ref 0–37)
Albumin: 4 g/dL (ref 3.5–5.2)
Alkaline Phosphatase: 102 U/L (ref 39–117)
BUN: 13 mg/dL (ref 6–23)
CO2: 19 mEq/L (ref 19–32)
Calcium: 9.1 mg/dL (ref 8.4–10.5)
Chloride: 103 mEq/L (ref 96–112)
Creatinine, Ser: 1.07 mg/dL (ref 0.50–1.35)
GFR calc Af Amer: 88 mL/min — ABNORMAL LOW (ref >90)
GFR calc non Af Amer: 76 mL/min — ABNORMAL LOW (ref >90)
Glucose, Bld: 98 mg/dL (ref 70–99)
Potassium: 4 mEq/L (ref 3.5–5.1)
Sodium: 137 mEq/L (ref 135–145)
Total Bilirubin: 0.6 mg/dL (ref 0.3–1.2)
Total Protein: 7.4 g/dL (ref 6.0–8.3)

## 2013-09-18 LAB — CBC WITH DIFFERENTIAL/PLATELET
Basophils Absolute: 0 10*3/uL (ref 0.0–0.1)
Basophils Relative: 0 % (ref 0–1)
Eosinophils Absolute: 0.2 10*3/uL (ref 0.0–0.7)
Eosinophils Relative: 2 % (ref 0–5)
HCT: 44.7 % (ref 39.0–52.0)
Hemoglobin: 14.5 g/dL (ref 13.0–17.0)
Lymphocytes Relative: 39 % (ref 12–46)
Lymphs Abs: 4 10*3/uL (ref 0.7–4.0)
MCH: 31.3 pg (ref 26.0–34.0)
MCHC: 32.4 g/dL (ref 30.0–36.0)
MCV: 96.5 fL (ref 78.0–100.0)
Monocytes Absolute: 0.9 10*3/uL (ref 0.1–1.0)
Monocytes Relative: 9 % (ref 3–12)
Neutro Abs: 5.3 10*3/uL (ref 1.7–7.7)
Neutrophils Relative %: 51 % (ref 43–77)
Platelets: 220 10*3/uL (ref 150–400)
RBC: 4.63 MIL/uL (ref 4.22–5.81)
RDW: 13.7 % (ref 11.5–15.5)
WBC: 10.4 10*3/uL (ref 4.0–10.5)

## 2013-09-18 LAB — CG4 I-STAT (LACTIC ACID): Lactic Acid, Venous: 2.28 mmol/L — ABNORMAL HIGH (ref 0.5–2.2)

## 2013-09-18 LAB — GLUCOSE, CAPILLARY: Glucose-Capillary: 107 mg/dL — ABNORMAL HIGH (ref 70–99)

## 2013-09-18 LAB — POCT GLYCOSYLATED HEMOGLOBIN (HGB A1C): Hemoglobin A1C: 5.2

## 2013-09-18 MED ORDER — LORAZEPAM 1 MG PO TABS: 1.0000 mg | ORAL_TABLET | Freq: Two times a day (BID) | ORAL | Status: DC | PRN

## 2013-09-18 MED ORDER — LORAZEPAM 2 MG/ML IJ SOLN
1.0000 mg | Freq: Once | INTRAMUSCULAR | Status: DC
  Filled 2013-09-18: qty 1

## 2013-09-18 MED ORDER — SODIUM CHLORIDE 0.9 % IV SOLN: 500.0000 mg | Freq: Two times a day (BID) | INTRAVENOUS | Status: DC

## 2013-09-18 MED ORDER — LEVETIRACETAM 1000 MG PO TABS: 1000.0000 mg | ORAL_TABLET | Freq: Two times a day (BID) | ORAL | Status: DC

## 2013-09-18 MED ORDER — LORAZEPAM 2 MG/ML IJ SOLN
1.0000 mg | Freq: Once | INTRAMUSCULAR | Status: AC
  Administered 2013-09-18: 1 mg via INTRAMUSCULAR

## 2013-09-18 MED ORDER — SODIUM CHLORIDE 0.9 % IV SOLN
1000.0000 mg | Freq: Once | INTRAVENOUS | Status: AC
  Administered 2013-09-18: 1000 mg via INTRAVENOUS
  Filled 2013-09-18: qty 10

## 2013-09-18 NOTE — Progress Notes (Signed)
Patient ID: Steven Read., male   DOB: Dec 29, 1955, 57 y.o.   MRN: [id number]  HPI:  ER f/u: Patient presented to ER last night due to possible seizure like activity. He was sweaty/jerky when he woke up. He takes keppra for a hx of seizures. At last visit we tried to refer him to neurology but he has not yet been seen there as he recently had a hernia repaired. At the ER he states he was given a shot of ativan and keppra, which calmed him down. He has run out of ativan at home, last took this 3-4 weeks ago until the ER last night. He was previously taking it about twice a day. His keppra dose is 1000mg  BID.   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 He gets botox done by a PM&R doctor. He reports having pressure in his skull over the last 3-4 months, which alternates sides of his heads. The pressure comes and goes. It goes away when ever he takes his medicines and is worse when he is out of his ativan.   Hypertension: does not check BP at home. Denies chest pain. Endorses occasional SOB after taking his medicines which is short in duration. He does not smoke.   ROS: See HPI  [place]: nonsmoker  PHYSICAL EXAM: BP 145/89  Pulse 67  Temp(Src) 97.6 F (36.4 C) (Oral)  Wt 171 lb (77.565 kg) Gen: NAD, pleasant and cooperative HEENT: NCAT, PERRL, MMM Heart: RRR Lungs: CTAB, NWOB Neuro: alert and oriented. R hand contracted & minimally useful. L hand with normal grip strength. Speech normal in volume but does have some word finding difficulties. Face symmetric.  ASSESSMENT/PLAN:  # ? Hx of diabetes: A1c today normal without any diabetes medications. Suspect this was an incorrect diagnosis. Will remove from problem list.   # Elevated BP: likely related to pt being in ER all night last night and not getting much sleep. Only mildly elevated. Will have pt f/u in 2 months to recheck BP and monitor rest of chronic medical problems.  See problem based charting  for additional assessment/plan.  FOLLOW UP: F/u in 2 months for chronic medical problems. Also referring to neurology for seizures & stroke history.  [place]. Ardelia Mems, [place]

## 2013-09-18 NOTE — ED Provider Notes (Signed)
CSN: [id number]     Arrival date & time 09/18/13  0142 History   First MD Initiated Contact with Patient 09/18/13 0214     Chief Complaint  Patient presents with  . Seizures   (Consider location/radiation/quality/duration/timing/severity/associated sxs/prior Treatment) HPI 57 year old male presents to emergency room with complaint of diffuse shaking, pre-seizure.  Symptoms.  Patient reports he ran out of his Bradycardia yesterday morning.  He's been on Keppra for the last 3 years 2 to seizure and stroke.  Patient also has run out of his Ativan about 1-2 weeks ago.  Patient has jerking of his right upper extremity intermittently, with diffuse jerking to the rest of his body occasionally.  Patient is awake and alert.  No reported loss of consciousness.  No incontinence. Past Medical History  Diagnosis Date  . Angioedema     rx requiring intubation/vent support suspected secondary to ACE1 (but also on zithromax) 5/09 hospitalization   . Smoker   . ETOH abuse   . Chronic pancreatitis     (10/08 hops). admx 02/2010 pseudocyst aspirated during admxn. pancreatic dual stent placement at [facility] 11/11  . 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   . Gastritis     alcohol induced  . Depression     no hx of meds  . VF (ventricular fibrillation) 2011    arrest 6/11. successfully rescucitated w/prolinged hospitalization due to respitatory failure. QT prolongation during cooling, now resolved  . History of stroke 2011    reports it was caused by pain from pancreatitis- caused heart to stop and stroke   . 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 Anxiety   . Edema   . 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 Anoxic brain damage   . Anemia   . Hypertension 2011  . Arthritis   . Chronic pain 2011  . Hyperlipidemia 2011  . Inguinal hernia 2011  . COPD (chronic obstructive pulmonary disease)   . GERD (gastroesophageal reflux disease)   .  Shortness of breath     exertion  . Pneumonia     hx of  . Stroke     right sided weakness; affected long term memory  . Seizures 2011    none within last year   . Diabetes mellitus without complication     Type 2, pt reports no longer takes meds & A1C was normal.   Past Surgical History  Procedure Laterality Date  . Right wrist orif  1976    for fx  . Pancreatic stent placement    . Inguinal hernia repair  1960    right  . Inguinal hernia repair Left 07/12/2013    Procedure: HERNIA REPAIR INGUINAL ADULT;  Surgeon: Adin Hector, MD;  Location: [place];  Service: General;  Laterality: Left;  . Insertion of mesh Left 07/12/2013    Procedure: INSERTION OF MESH;  Surgeon: Adin Hector, MD;  Location: [place];  Service: General;  Laterality: Left;   Family History  Problem Relation Age of Onset  . Coronary artery disease Neg Hx   . Colon cancer Neg Hx   . Alcohol abuse Mother   . Alcohol abuse Father   . Alcohol abuse    . Early death Mother   . Diabetes Sister   . Hyperlipidemia Sister   . Hypertension Sister   . Prostate cancer Father 57  . Breast cancer Paternal Grandmother    History  Substance Use Topics  . Smoking status: Current Every Day Smoker -- 0.25 packs/day    Types: Cigarettes  . Smokeless tobacco: Former Systems developer    Quit date: 05/26/2010  . Alcohol Use: Yes     Comment: hx of alcohol abuse, every 2-3 weeks beer    Review of Systems  See History of Present Illness; otherwise all other systems are reviewed and negative Allergies  Azithromycin and Lisinopril  Home Medications   Current Outpatient Rx  Name  Route  Sig  Dispense  Refill  . carvedilol (COREG) 3.125 MG tablet   Oral   Take 3.125 mg by mouth 2 (two) times daily with a meal.         . folic acid (FOLVITE) 1 MG tablet   Oral   Take 1 tablet (1 mg total) by mouth daily.   90 tablet   3   . levETIRAcetam (KEPPRA) 1000 MG tablet   Oral   Take 1,000 mg by mouth 2 (two) times daily.          . lipase/protease/amylase (CREON-12/PANCREASE) 12000 UNITS CPEP capsule   Oral   Take 1 capsule by mouth daily.         Marland Kitchen LORazepam (ATIVAN) 1 MG tablet   Oral   Take 1 tablet (1 mg total) by mouth 2 (two) times daily as needed for anxiety.   30 tablet   0   . thiamine (VITAMIN B-1) 100 MG tablet   Oral   Take 1 tablet (100 mg total) by mouth daily.   90 tablet   3   . tiZANidine (ZANAFLEX) 4 MG tablet   Oral   Take 1 tablet (4 mg total) by mouth every 6 (six) hours as needed.   120 tablet   5   . vitamin B-12 (CYANOCOBALAMIN) 1000 MCG tablet   Oral   Take 1 tablet (1,000 mcg total) by mouth daily.   90 tablet   3    BP 126/82  Pulse 69  Temp(Src) 98 F (36.7 C) (Oral)  Resp 21  SpO2 94% Physical Exam  Nursing note and vitals reviewed. Constitutional: He is oriented to person, place, and time. He appears well-developed and well-nourished. He appears distressed.  HENT:  Head: Normocephalic and atraumatic.  Nose: Nose normal.  Mouth/Throat: Oropharynx is clear and moist. No oropharyngeal exudate.  Neck: Normal range of motion. Neck supple. No JVD present. No tracheal deviation present. No thyromegaly present.  Cardiovascular: Normal rate, regular rhythm, normal heart sounds and intact distal pulses.  Exam reveals no gallop and no friction rub.   No murmur heard. Pulmonary/Chest: Effort normal and breath sounds normal. No stridor. No respiratory distress. He has no wheezes. He has no rales. He exhibits no tenderness.  Abdominal: Soft. Bowel sounds are normal. He exhibits no distension and no mass. There is no tenderness. There is no rebound and no guarding.  Musculoskeletal: He exhibits no tenderness.  Lymphadenopathy:    He has no cervical adenopathy.  Neurological: He is alert and oriented to person, place, and time. He exhibits abnormal muscle tone. Coordination abnormal.  Patient with intermittent spasmodic jerking of the right upper extremity, diffuse  spasms of the entire body.  It is difficult to ascertain if these are involuntary or voluntary.  Patient is able to control them wall.  I do a heart and lung exam.  Skin: Skin is warm. No rash noted. He is diaphoretic. No erythema. No pallor.    ED Course  Procedures (including critical care time) Labs Review Labs Reviewed  GLUCOSE, CAPILLARY - Abnormal; Notable for the following:    Glucose-Capillary 107 (*)    All other components within normal limits  COMPREHENSIVE METABOLIC PANEL - Abnormal; Notable for the following:    GFR calc non Af Amer 76 (*)    GFR calc Af Amer 88 (*)    All other components within normal limits  CG4 I-STAT (LACTIC ACID) - Abnormal; Notable for the following:    Lactic Acid, Venous 2.28 (*)    All other components within normal limits  CBC WITH DIFFERENTIAL   Imaging Review No results found.  EKG Interpretation    Date/Time:  Wednesday September 18 2013 01:49:46 EST Ventricular Rate:  77 PR Interval:  177 QRS Duration: 89 QT Interval:  381 QTC Calculation: 431 R Axis:   18 Text Interpretation:  Sinus arrhythmia Ventricular premature complex Probable left atrial enlargement Borderline T abnormalities, anterior leads Baseline wander in lead(s) I II III aVR aVL V6 Poor data quality in current ECG precludes serial comparison Confirmed by Milton Streicher  MD, Money Mckeithan [id number]) on 09/18/2013 4:28:52 AM            MDM   1. Spastic hemiplegia affecting dominant side    Patient with resolution of jerking after IM injection of Ativan.  He has received a IV bolus of Keppra.  Patient has followup later today with primary care Dr. for refill of his medications.  The patient is stable for discharge home.  At this time.    Kalman Drape, MD 09/18/13 [phone]

## 2013-09-18 NOTE — ED Notes (Addendum)
Notified MD Sharol Given) and RN Cassie of elavated Latic Acid of 2.28

## 2013-09-18 NOTE — ED Notes (Signed)
Pt reports shaking all day; daughter states he normally shakes like this before having a seizure; pt a&ox4; hx of CVA with R sided deficits; pt reports tightness/numbness in R arm/hand and R leg; also reports "tightness of skull"

## 2013-09-18 NOTE — ED Notes (Signed)
Dr Sharol Given in room to assess pt.

## 2013-09-18 NOTE — Patient Instructions (Addendum)
It was great to see you again today!  I refilled your Keppra and lorazepam. I am referring you again to neurology. You should be called to schedule this appointment. It is VERY important that you be seen there.  Come back to see me in 2 months to follow up on your regular chronic problems.  Be well, Dr. Ardelia Mems

## 2013-09-18 NOTE — ED Notes (Signed)
Pt states that he ran out of his keppra yesterday morning and has a dr appt this morning for a refill.

## 2013-09-20 ENCOUNTER — Ambulatory Visit (HOSPITAL_BASED_OUTPATIENT_CLINIC_OR_DEPARTMENT_OTHER): Payer: Medicare Other | Admitting: Physical Medicine & Rehabilitation

## 2013-09-20 ENCOUNTER — Encounter: Payer: Medicare Other | Attending: Physical Medicine & Rehabilitation

## 2013-09-20 ENCOUNTER — Encounter: Payer: Self-pay | Admitting: Physical Medicine & Rehabilitation

## 2013-09-20 VITALS — BP 147/95 | HR 80 | Resp 14 | Ht 68.0 in | Wt 172.0 lb

## 2013-09-20 DIAGNOSIS — G8110 Spastic hemiplegia affecting unspecified side: Secondary | ICD-10-CM | POA: Diagnosis not present

## 2013-09-20 DIAGNOSIS — G8190 Hemiplegia, unspecified affecting unspecified side: Secondary | ICD-10-CM | POA: Insufficient documentation

## 2013-09-20 MED ORDER — TIZANIDINE HCL 4 MG PO TABS: 8.0000 mg | ORAL_TABLET | Freq: Four times a day (QID) | ORAL | Status: DC | PRN

## 2013-09-20 MED ORDER — TIZANIDINE HCL 4 MG PO TABS: 4.0000 mg | ORAL_TABLET | Freq: Four times a day (QID) | ORAL | Status: DC | PRN

## 2013-09-20 NOTE — Assessment & Plan Note (Signed)
Still it is unclear to me why patient is not on an aspirin or statin, although possibly secondary to hx of cirrhosis noted on problem list. As I am referring him to neurology for management of his reported seizure disorder, I will also ask that neuro weigh in on whether he should be on these medications.

## 2013-09-20 NOTE — Progress Notes (Signed)
1. Right spastic hemiplegia. He's had improvement in pain after Botox injection however deformities have not changed much. He had 300 units injected. Will increase dose to 400 units. Also will concentrate more the medication into the pronator teres and wrist flexor muscle groups   Botox injection under estim and EMG guidance 50unit/ml dilution REMS form on file Informed consent obtained  50  units right pronator teres 50 units right FCU 50 units right FCR 75 units right EDC 50 units right EIP 25 units right adductor pollicis 25 units right ADM 50 units right medial FDP 12.5 units interosseous x3 12.5 units opponens pollicis  Continue Zanaflex increase to QID

## 2013-09-20 NOTE — Patient Instructions (Signed)
Since UR on a high dose of Zanaflex, I recommend liver enzymes every 6-12 months. The last set of enzymes performed 09/18/2013 were normal

## 2013-09-20 NOTE — Assessment & Plan Note (Signed)
Had previously referred to neuro but pt never was seen there. I refilled his keppra and ativan today to prevent further seizure activity. Will re-order referral to neurology as I would like them to manage his seizures.

## 2013-10-17 ENCOUNTER — Telehealth: Payer: Self-pay | Admitting: Family Medicine

## 2013-10-17 ENCOUNTER — Other Ambulatory Visit: Payer: Self-pay | Admitting: Family Medicine

## 2013-10-17 MED ORDER — LORAZEPAM 1 MG PO TABS: 1.0000 mg | ORAL_TABLET | Freq: Two times a day (BID) | ORAL | Status: DC | PRN

## 2013-10-17 NOTE — Telephone Encounter (Signed)
To [facility] red team - please call pt and let him know I have refilled one month's worth of his ativan but he needs to schedule an appointment with neurology for his seizures. I have now referred him there twice and he still has not scheduled an appointment. Thanks!  Leeanne Rio, MD

## 2013-10-17 NOTE — Telephone Encounter (Signed)
Called pt. Spoke with Alita Chyle (pt's sister). Informed. She reports, that the reason WHY they have not scheduled an appt is, because they owe some money. They will start to pay the neurologist and then schedule an appt. Thanks. Javier Glazier, Gerrit Heck

## 2013-10-30 ENCOUNTER — Other Ambulatory Visit: Payer: Self-pay | Admitting: Family Medicine

## 2013-11-20 ENCOUNTER — Other Ambulatory Visit: Payer: Self-pay | Admitting: Family Medicine

## 2013-11-28 ENCOUNTER — Other Ambulatory Visit: Payer: Self-pay | Admitting: Family Medicine

## 2013-12-23 ENCOUNTER — Ambulatory Visit: Payer: Medicare Other | Admitting: Physical Medicine & Rehabilitation

## 2013-12-27 ENCOUNTER — Other Ambulatory Visit: Payer: Self-pay | Admitting: Family Medicine

## 2014-01-19 ENCOUNTER — Other Ambulatory Visit: Payer: Self-pay | Admitting: Family Medicine

## 2014-01-21 ENCOUNTER — Telehealth: Payer: Self-pay | Admitting: Family Medicine

## 2014-01-21 NOTE — Telephone Encounter (Signed)
Patient calls, is completely out of this medicine. Needs to be refill as soon as possible.

## 2014-01-21 NOTE — Telephone Encounter (Signed)
To [facility] red team - please call pt and let him know I have refilled one month's worth of his keppra but he needs to schedule an appointment with neurology as we have discussed in multiple prior office visits and during a phone call in January. Thanks!  Leeanne Rio, MD

## 2014-01-22 NOTE — Telephone Encounter (Signed)
Called pt. Informed. Pt agreed. .Song Myre  

## 2014-01-23 ENCOUNTER — Encounter: Payer: Medicare Other | Attending: Physical Medicine & Rehabilitation

## 2014-01-23 ENCOUNTER — Ambulatory Visit (HOSPITAL_BASED_OUTPATIENT_CLINIC_OR_DEPARTMENT_OTHER): Payer: Medicare Other | Admitting: Physical Medicine & Rehabilitation

## 2014-01-23 ENCOUNTER — Encounter: Payer: Self-pay | Admitting: Physical Medicine & Rehabilitation

## 2014-01-23 VITALS — BP 100/60 | HR 97 | Resp 14 | Ht 68.0 in | Wt 165.0 lb

## 2014-01-23 DIAGNOSIS — G8110 Spastic hemiplegia affecting unspecified side: Secondary | ICD-10-CM | POA: Diagnosis not present

## 2014-01-23 NOTE — Progress Notes (Signed)
1. Right spastic hemiplegia. He's had improvement in pain after Botox injection however deformities have not changed much. He had 300 units injected. Will increase dose to 400 units. Also will concentrate more the medication into the pronator teres and wrist flexor muscle groups   Botox injection under estim and EMG guidance 50unit/ml dilution REMS form on file Informed consent obtained  50  units right pronator teres 50 units right FCU 50 units right FCR 75 units right EDC 50 units right EIP 25 units right adductor pollicis 25 units right ADM 50 units right medial FDP 12.5 units interosseous x3 12.5 units opponens pollicis  Continue Zanaflex increase to QID

## 2014-01-23 NOTE — Patient Instructions (Signed)

## 2014-02-19 ENCOUNTER — Other Ambulatory Visit: Payer: Self-pay | Admitting: Family Medicine

## 2014-03-05 ENCOUNTER — Other Ambulatory Visit: Payer: Self-pay | Admitting: Family Medicine

## 2014-03-24 ENCOUNTER — Other Ambulatory Visit: Payer: Self-pay | Admitting: Family Medicine

## 2014-04-03 ENCOUNTER — Other Ambulatory Visit: Payer: Self-pay | Admitting: Physical Medicine & Rehabilitation

## 2014-04-22 ENCOUNTER — Other Ambulatory Visit: Payer: Self-pay | Admitting: Family Medicine

## 2014-04-24 ENCOUNTER — Ambulatory Visit: Payer: Medicare Other | Admitting: Physical Medicine & Rehabilitation

## 2014-04-24 ENCOUNTER — Encounter: Payer: Medicare Other | Attending: Physical Medicine & Rehabilitation

## 2014-04-24 DIAGNOSIS — G8110 Spastic hemiplegia affecting unspecified side: Secondary | ICD-10-CM | POA: Insufficient documentation

## 2014-04-25 ENCOUNTER — Other Ambulatory Visit: Payer: Self-pay | Admitting: Physical Medicine & Rehabilitation

## 2014-04-25 ENCOUNTER — Other Ambulatory Visit: Payer: Self-pay | Admitting: *Deleted

## 2014-04-25 NOTE — Telephone Encounter (Signed)
Patient is completely out of [place]. Please refill this medicine asap.

## 2014-05-24 ENCOUNTER — Other Ambulatory Visit: Payer: Self-pay | Admitting: Family Medicine

## 2014-06-24 ENCOUNTER — Other Ambulatory Visit: Payer: Self-pay | Admitting: Family Medicine

## 2014-06-26 ENCOUNTER — Telehealth: Payer: Self-pay | Admitting: Family Medicine

## 2014-06-26 NOTE — Telephone Encounter (Signed)
To [facility] red team - please call pt and let him know I have refilled his Keppra but he needs to schedule an appointment with neurology and with me to follow up on his seizures. He has been noncompliant with scheduling these appointments and I will not continue to refill his medications if he does not schedule these appointments. Thanks!  Leeanne Rio, MD

## 2014-06-27 NOTE — Telephone Encounter (Signed)
Informed pt sister about RX and to make appts with neurology and dr Ardelia Mems. Blake Goya CMA

## 2014-07-02 ENCOUNTER — Telehealth: Payer: Self-pay | Admitting: Home Health Services

## 2014-07-02 NOTE — Telephone Encounter (Signed)
LM for patient to call back.  Pt needs to schedule follow up visit with PCP for continued medication refills.  Vinnie Level

## 2014-07-27 ENCOUNTER — Other Ambulatory Visit: Payer: Self-pay | Admitting: Family Medicine

## 2014-07-30 ENCOUNTER — Other Ambulatory Visit: Payer: Self-pay | Admitting: Family Medicine

## 2014-07-31 NOTE — Telephone Encounter (Signed)
error 

## 2014-08-06 ENCOUNTER — Ambulatory Visit: Payer: Medicare Other | Admitting: Family Medicine

## 2014-08-06 ENCOUNTER — Telehealth: Payer: Self-pay | Admitting: Family Medicine

## 2014-08-06 NOTE — Telephone Encounter (Signed)
Pt had to cancel because they had no way to get there. They will reschedule when the December schedule is up. jw

## 2014-08-06 NOTE — Telephone Encounter (Signed)
FYI

## 2014-08-31 ENCOUNTER — Other Ambulatory Visit: Payer: Self-pay | Admitting: Family Medicine

## 2014-09-06 ENCOUNTER — Encounter (HOSPITAL_COMMUNITY): Payer: Self-pay | Admitting: *Deleted

## 2014-09-06 ENCOUNTER — Emergency Department (HOSPITAL_COMMUNITY)
Admission: EM | Admit: 2014-09-06 | Discharge: 2014-09-06 | Disposition: A | Discharge status: Home / Self Care | Payer: Medicare Other | Attending: Emergency Medicine | Admitting: Emergency Medicine

## 2014-09-06 DIAGNOSIS — E119 Type 2 diabetes mellitus without complications: Secondary | ICD-10-CM | POA: Diagnosis not present

## 2014-09-06 DIAGNOSIS — Z79899 Other long term (current) drug therapy: Secondary | ICD-10-CM | POA: Insufficient documentation

## 2014-09-06 DIAGNOSIS — D649 Anemia, unspecified: Secondary | ICD-10-CM | POA: Insufficient documentation

## 2014-09-06 DIAGNOSIS — K861 Other chronic pancreatitis: Secondary | ICD-10-CM | POA: Insufficient documentation

## 2014-09-06 DIAGNOSIS — Z8659 Personal history of other mental and behavioral disorders: Secondary | ICD-10-CM | POA: Diagnosis not present

## 2014-09-06 DIAGNOSIS — Z760 Encounter for issue of repeat prescription: Secondary | ICD-10-CM

## 2014-09-06 DIAGNOSIS — Z8679 Personal history of other diseases of the circulatory system: Secondary | ICD-10-CM | POA: Diagnosis not present

## 2014-09-06 DIAGNOSIS — Z8719 Personal history of other diseases of the digestive system: Secondary | ICD-10-CM | POA: Diagnosis not present

## 2014-09-06 DIAGNOSIS — I10 Essential (primary) hypertension: Secondary | ICD-10-CM | POA: Insufficient documentation

## 2014-09-06 DIAGNOSIS — J449 Chronic obstructive pulmonary disease, unspecified: Secondary | ICD-10-CM | POA: Diagnosis not present

## 2014-09-06 DIAGNOSIS — Z8669 Personal history of other diseases of the nervous system and sense organs: Secondary | ICD-10-CM | POA: Insufficient documentation

## 2014-09-06 DIAGNOSIS — G8929 Other chronic pain: Secondary | ICD-10-CM | POA: Diagnosis not present

## 2014-09-06 DIAGNOSIS — Z8673 Personal history of transient ischemic attack (TIA), and cerebral infarction without residual deficits: Secondary | ICD-10-CM | POA: Diagnosis not present

## 2014-09-06 DIAGNOSIS — Z8701 Personal history of pneumonia (recurrent): Secondary | ICD-10-CM | POA: Insufficient documentation

## 2014-09-06 DIAGNOSIS — Z8739 Personal history of other diseases of the musculoskeletal system and connective tissue: Secondary | ICD-10-CM | POA: Diagnosis not present

## 2014-09-06 DIAGNOSIS — Z720 Tobacco use: Secondary | ICD-10-CM | POA: Insufficient documentation

## 2014-09-06 MED ORDER — LEVETIRACETAM 1000 MG PO TABS: 1000.0000 mg | ORAL_TABLET | Freq: Two times a day (BID) | ORAL | Status: DC

## 2014-09-06 NOTE — ED Provider Notes (Signed)
CSN: [id number]     Arrival date & time 09/06/14  1036 History  This chart was scribed for Comer Locket, PA-C, working with Charlesetta Shanks, MD by Steva Colder, ED Scribe. The patient was seen in room TR08C/TR08C at 12:03 PM.   Chief Complaint  Patient presents with  . Medication Refill     The history is provided by the patient. No language interpreter was used.   11:53 AM- Pt not in room.   HPI Comments: Steven Frey. is a 58 y.o. male with a medical hx of seizures who presents to the Emergency Department complaining of medication refill of Keppra. His Keppra is Rx by Dr. Ardelia Mems, she will not refill any of his medication until he sees her. He had to reschedule his appointment because he couldn't get a ride. He states that he has an appointment in December. On 11/25, he was informed by the pharmacy that his Rx had been rejected twice because he has not seen his PCP. He denies any other associated symptoms. He has a hx of a Stroke in 2011 and 4 seizures while on keppra since the stroke. Relative informs that the pt right arm will tense up and then he will go into a full blown seizure. Pt states that since the stroke the hand stays spastic. Pt informs [place] that if he can not get his medication refilled, then "You might as well give me a room in the hospital cause I'll be here for seizures."   Past Medical History  Diagnosis Date  . Angioedema     rx requiring intubation/vent support suspected secondary to ACE1 (but also on zithromax) 5/09 hospitalization   . Smoker   . ETOH abuse   . Chronic pancreatitis     (10/08 [place]). admx 02/2010 pseudocyst aspirated during admxn. pancreatic dual stent placement at [facility] 11/11  . 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   . Gastritis     alcohol induced  . Depression     no hx of meds  . VF (ventricular fibrillation) 2011    arrest 6/11. successfully rescucitated w/prolinged hospitalization due to  respitatory failure. QT prolongation during cooling, now resolved  . History of stroke 2011    reports it was caused by pain from pancreatitis- caused heart to stop and stroke   . 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 Anxiety   . Edema   . 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 Anoxic brain damage   . Anemia   . Hypertension 2011  . Arthritis   . Chronic pain 2011  . Hyperlipidemia 2011  . Inguinal hernia 2011  . COPD (chronic obstructive pulmonary disease)   . GERD (gastroesophageal reflux disease)   . Shortness of breath     exertion  . Pneumonia     hx of  . Stroke     right sided weakness; affected long term memory  . Seizures 2011    none within last year   . Diabetes mellitus without complication     Type 2, pt reports no longer takes meds & A1C was normal.   Past Surgical History  Procedure Laterality Date  . Right wrist orif  1976    for fx  . Pancreatic stent placement    . Inguinal hernia repair  1960    right  . Inguinal hernia repair Left 07/12/2013    Procedure: HERNIA REPAIR INGUINAL ADULT;  Surgeon:  Adin Hector, MD;  Location: [place];  Service: General;  Laterality: Left;  . Insertion of mesh Left 07/12/2013    Procedure: INSERTION OF MESH;  Surgeon: Adin Hector, MD;  Location: [place];  Service: General;  Laterality: Left;   Family History  Problem Relation Age of Onset  . Coronary artery disease Neg Hx   . Colon cancer Neg Hx   . Alcohol abuse Mother   . Alcohol abuse Father   . Alcohol abuse    . Early death Mother   . Diabetes Sister   . Hyperlipidemia Sister   . Hypertension Sister   . Prostate cancer Father 77  . Breast cancer Paternal Grandmother    History  Substance Use Topics  . Smoking status: Current Every Day Smoker -- 0.25 packs/day    Types: Cigarettes  . Smokeless tobacco: Former Systems developer    Quit date: 05/26/2010  . Alcohol Use: Yes     Comment: hx of alcohol abuse, every 2-3 weeks beer    Review  of Systems  All other systems reviewed and are negative.     Allergies  Azithromycin and Lisinopril  Home Medications   Prior to Admission medications   Medication Sig Start Date End Date Taking? Authorizing Provider  carvedilol (COREG) 3.125 MG tablet TAKE 1 TABLET BY MOUTH TWICE A DAY WITH MEALS 10/30/13   Leeanne Rio, MD  CREON 12000 UNITS CPEP capsule TAKE ONE CAPSULE BY MOUTH EVERY DAY    Leeanne Rio, MD  [facility] B-12 1000 MCG TBCR TAKE 1 TABLET BY MOUTH DAILY    Leeanne Rio, MD  folic acid (FOLVITE) 1 MG tablet TAKE 1 TABLET BY MOUTH DAILY    Leeanne Rio, MD  levETIRAcetam (KEPPRA) 1000 MG tablet Take 1 tablet (1,000 mg total) by mouth 2 (two) times daily. 09/06/14   Viona Gilmore Kenyana Husak, PA-C  thiamine (VITAMIN B-1) 100 MG tablet Take 1 tablet (100 mg total) by mouth daily. 12/25/12   Jacquelyn A McGill, MD  tiZANidine (ZANAFLEX) 4 MG tablet TAKE 2 TABLETS (8 MG TOTAL) BY MOUTH EVERY 6 (SIX) HOURS AS NEEDED. 04/03/14   Charlett Blake, MD  tiZANidine (ZANAFLEX) 4 MG tablet TAKE 2 TABLETS (8 MG TOTAL) BY MOUTH EVERY 6 (SIX) HOURS AS NEEDED. 04/25/14   Charlett Blake, MD   BP 155/94 mmHg  Pulse 96  Temp(Src) 97.3 F (36.3 C) (Oral)  Resp 20  Ht 5\' 8"  (1.727 m)  Wt 165 lb (74.844 kg)  BMI 25.09 kg/m2  SpO2 96%  Physical Exam  Constitutional: He is oriented to person, place, and time. He appears well-developed and well-nourished. No distress.  HENT:  Head: Normocephalic and atraumatic.  Mouth/Throat: Oropharynx is clear and moist.  Eyes: Conjunctivae and EOM are normal. Pupils are equal, round, and reactive to light. Right eye exhibits no discharge. Left eye exhibits no discharge. No scleral icterus.  Neck: Neck supple. No tracheal deviation present.  Cardiovascular: Normal rate, regular rhythm and normal heart sounds.   Pulmonary/Chest: Effort normal and breath sounds normal. No respiratory distress. He has no wheezes. He has no rales.   Abdominal: Soft. There is no tenderness.  Musculoskeletal: Normal range of motion. He exhibits no tenderness.  Neurological: He is alert and oriented to person, place, and time.  Cranial Nerves II-XII grossly intact. No new focal neurodeficits. Patient does maintain spasticity in right hand that is chronic in nature.  Skin: Skin is warm and dry. No rash  noted.  Psychiatric: He has a normal mood and affect. His behavior is normal.  Nursing note and vitals reviewed.   ED Course  Procedures (including critical care time) DIAGNOSTIC STUDIES: Oxygen Saturation is 96% on room air, normal by my interpretation.    COORDINATION OF CARE: 12:11 PM-Discussed treatment plan which includes refill and PCP follow up with pt at bedside and pt agreed to plan.   Labs Review Labs Reviewed - No data to display  Imaging Review No results found.   EKG Interpretation None      MDM  Vitals stable - WNL -afebrile Pt resting comfortably in ED. Normal physical exam. Patient requesting refill for Keppra that he has been on since 2011. States his primary care provider, Dr. Ardelia Mems, will not provide him a refill until he has a follow-up appointment with her. Patient presents today out of his medication. He reports he will have a seizure if he does not have it. At this time I will treat the patient and give him 1 refill of Keppra, but he will need to follow-up with primary care. I will also send a staff message to Dr. Ardelia Mems for follow-up with her patient. Patient stable, in good condition and is appropriate for discharge.    Final diagnoses:  Medication refill      I personally performed the services described in this documentation, which was scribed in my presence. The recorded information has been reviewed and is accurate.    Viona Gilmore Herlong, PA-C 09/06/14 2038  Charlesetta Shanks, MD 09/07/14 [phone]

## 2014-09-06 NOTE — ED Notes (Signed)
Pt reports being out of keppra prescription. Has been taking family members meds but reports that he has not missed a dose. No other complaints. No distress noted at triage.

## 2014-09-06 NOTE — ED Notes (Signed)
Pt called from lobby no answer pt brought back to room when found

## 2014-09-06 NOTE — ED Notes (Signed)
Declined W/C at D/C and was escorted to lobby by RN. 

## 2014-09-06 NOTE — Discharge Instructions (Signed)
Medication Refill, Emergency Department °We have refilled your medication today as a courtesy to you. It is best for your medical care, however, to take care of getting refills done through your primary caregiver's office. They have your records and can do a better job of follow-up than we can in the emergency department. °On maintenance medications, we often only prescribe enough medications to get you by until you are able to see your regular caregiver. This is a more expensive way to refill medications. °In the future, please plan for refills so that you will not have to use the emergency department for this. °Thank you for your help. Your help allows us to better take care of the daily emergencies that enter our department. °Document Released: 01/13/2004 Document Revised: 12/19/2011 Document Reviewed: 01/03/2014 °ExitCare® Patient Information ©2015 [employer]. This information is not intended to replace advice given to you by your health care provider. Make sure you discuss any questions you have with your health care provider. ° °

## 2014-09-19 ENCOUNTER — Ambulatory Visit (INDEPENDENT_AMBULATORY_CARE_PROVIDER_SITE_OTHER): Payer: Medicare Other | Admitting: Family Medicine

## 2014-09-19 ENCOUNTER — Encounter: Payer: Self-pay | Admitting: Family Medicine

## 2014-09-19 VITALS — BP 156/106 | HR 101 | Temp 98.3°F | Ht 68.0 in | Wt 171.0 lb

## 2014-09-19 DIAGNOSIS — R569 Unspecified convulsions: Secondary | ICD-10-CM

## 2014-09-19 DIAGNOSIS — I10 Essential (primary) hypertension: Secondary | ICD-10-CM

## 2014-09-19 DIAGNOSIS — F32A Depression, unspecified: Secondary | ICD-10-CM

## 2014-09-19 DIAGNOSIS — F329 Major depressive disorder, single episode, unspecified: Secondary | ICD-10-CM

## 2014-09-19 MED ORDER — LEVETIRACETAM 1000 MG PO TABS: 1000.0000 mg | ORAL_TABLET | Freq: Two times a day (BID) | ORAL | Status: DC

## 2014-09-19 MED ORDER — SERTRALINE HCL 50 MG PO TABS: 50.0000 mg | ORAL_TABLET | Freq: Every day | ORAL | Status: DC

## 2014-09-19 MED ORDER — CARVEDILOL 3.125 MG PO TABS: 3.1250 mg | ORAL_TABLET | Freq: Two times a day (BID) | ORAL | Status: DC

## 2014-09-19 NOTE — Patient Instructions (Signed)
For depression: -start zoloft 50mg  daily -RETURN IN 1 WEEK TO BE REASSESSED with any doctor here, then see me in 1 month -we will give you more medicine at that time if you're tolerating it okay -If you have any thoughts of hurting yourself or anyone else, go to the Emergency Room to stay safe.  -call us if you aren't able to sleep, start talking fast, etc.  For blood pressure: -restart carvedilol. I sent in a prescription  For seizures: -refilled keppra  I will see you in one month Happy Holidays!  Dr. Ardelia Mems

## 2014-09-19 NOTE — Progress Notes (Signed)
Patient ID: Steven Frey., male   DOB: [birth year], 58 y.o.   MRN: [id number]  HPI:  Depression/anxiety - feeling down and anxious several days out of the week. No prior hospitalizations for mental health. No past symptoms concerning for mania. Does not have any thoughts or plans of harming others. He does have occasional thoughts of not wanting to live, all stemming from his stroke which has left him partially incapacitated. He states if he were to harm himself, he would go pick a fight with someone and let them harm him. He does not have any plans to do this at present. He is willing to take medication for depression and anxiety.  Seizures - states has not had seizure in over 1 year. On keppra daily. Needs refill. Has not seen neurologist because owes them money but is willing to work on getting in with them  HTN - has not been taking carvedilol lately as he's been out of it. Needs refill. No CP or SOB  ROS: See HPI.  [place]: hx seizures, CVA, HTN, chronic pancreatitis, HLD  PHYSICAL EXAM: BP 156/106 mmHg  Pulse 101  Temp(Src) 98.3 F (36.8 C) (Oral)  Ht 5\' 8"  (1.727 m)  Wt 171 lb (77.565 kg)  BMI 26.01 kg/m2 Gen: NAD HEENT: NCAT Heart: RRR no murmurs Lungs: CTAB NWOB Neuro: appears at baseline. Speech normal Psych: affect relatively blunted, well groomed, speech normal in rate and volume, normal eye contact   ASSESSMENT/PLAN:  Depression: Patient was able to contract for safety today, states he would go to ER if he were to have thoughts of wanting to harm himself or someone else. I think he would benefit from starting an SSRI. Will start zoloft 50mg  daily. Follow up here at [facility] with any doctor in 1 week to be reevaluated and ensure tolerability. F/u with me in 1 month.  HYPERTENSION, BENIGN Uncontrolled due to being out of medciation. Refilled carvedilol today. Will f/u on BP at future visits  Seizures Stable, denies report of seizure in the last year. I have discussed with  pt the dire importance of him following up regularly. He has not seen me in the last year. I will refill several months of his keppra since he seems to be well controlled for now. He will work on paying of his neurology bill so he can be seen there as well.    FOLLOW UP: F/u in 1 week for depression with any doctor at [facility] See me in 1 mo for depression as well  [place] J. Ardelia Mems, [place]

## 2014-09-26 ENCOUNTER — Encounter: Payer: Self-pay | Admitting: Family Medicine

## 2014-09-26 ENCOUNTER — Ambulatory Visit (INDEPENDENT_AMBULATORY_CARE_PROVIDER_SITE_OTHER): Payer: Medicare Other | Admitting: Family Medicine

## 2014-09-26 ENCOUNTER — Ambulatory Visit (INDEPENDENT_AMBULATORY_CARE_PROVIDER_SITE_OTHER): Payer: Medicare Other | Admitting: *Deleted

## 2014-09-26 VITALS — BP 173/86 | HR 64 | Temp 97.6°F | Ht 68.0 in | Wt 168.2 lb

## 2014-09-26 DIAGNOSIS — Z23 Encounter for immunization: Secondary | ICD-10-CM

## 2014-09-26 DIAGNOSIS — F329 Major depressive disorder, single episode, unspecified: Secondary | ICD-10-CM | POA: Insufficient documentation

## 2014-09-26 DIAGNOSIS — F321 Major depressive disorder, single episode, moderate: Secondary | ICD-10-CM

## 2014-09-26 NOTE — Assessment & Plan Note (Addendum)
Uncontrolled due to being out of medciation. Refilled carvedilol today. Will f/u on BP at future visits

## 2014-09-26 NOTE — Patient Instructions (Signed)
Taking the medicine as directed and not missing any doses is one of the best things you can do to treat your depression.  Here are some things to keep in mind:  1) Side effects (stomach upset, some increased anxiety) may happen before you notice a benefit.  These side effects typically go away over time. 2) Changes to your dose of medicine or a change in medication all together is sometimes necessary 3) Most people need to be on medication at least 6-12 months 4) Many people will notice an improvement within two weeks but the full effect of the medication can take up to 4-6 weeks 5) Stopping the medication when you start feeling better often results in a return of symptoms 6) If you start having thoughts of hurting yourself or others after starting this medicine, please call me at [phone] immediately.

## 2014-09-26 NOTE — Assessment & Plan Note (Addendum)
Pt slowly improving on Zoloft w/o GI SE Continue with this current course and f/u with Dr. Ardelia Mems in about 2-4 weeks to see how he is doing Does not want to increase dose at this time, wants to wait until f/u when can decide at that point [place] 9 09/26/2014 - 18.  # 9 = 2.  W/o active plan or any plan to hurt someone or himself (1 or 2 times over last week but good insight into this w/o access to guns or drugs)   Discussed what to do if starts to have SI/HI and pt understands

## 2014-09-26 NOTE — Assessment & Plan Note (Signed)
Stable, denies report of seizure in the last year. I have discussed with pt the dire importance of him following up regularly. He has not seen me in the last year. I will refill several months of his keppra since he seems to be well controlled for now. He will work on paying of his neurology bill so he can be seen there as well.

## 2014-09-26 NOTE — Progress Notes (Signed)
Subjective:     Steven Brees. is a 58 y.o. male who presents for follow up of depression. Current symptoms include anhedonia, depressed mood, difficulty concentrating, fatigue and suicidal thoughts without plan. Symptoms have been slightly improved since that time. Patient denies feelings of worthlessness/guilt, psychomotor agitation, psychomotor retardation, suicidal attempt and suicidal thoughts with specific plan. Previous treatment includes: none. He complains of the following side effects from the treatment: none.  Pt was started on Zoloft about one week ago now.  Doing well, tolerating drug well, starting to see an improvement in his overall mood and energy.  Denies any SE from medication at this time.  Denies IVDA or Rx drug abuse.  Drinking alcohol about 2-3 drinks per week.    The following portions of the patient's history were reviewed and updated as appropriate: allergies, current medications, past family history, past medical history, past social history, past surgical history and problem list.  Review of Systems Pertinent items are noted in HPI.    Objective:    BP 173/86 mmHg  Pulse 64  Temp(Src) 97.6 F (36.4 C) (Oral)  Ht 5\' 8"  (1.727 m)  Wt 168 lb 3.2 oz (76.295 kg)  BMI 25.58 kg/m2  General:  alert, cooperative and appears stated age  Affect & Behavior:  full facial expressions, good grooming, good insight, normal perception, normal reasoning, normal speech pattern and content and normal thought patterns        Assessment:    Depression, gradually improving      Plan:  Pt slowly improving on Zoloft w/o GI SE Continue with this current course and f/u with Dr. Ardelia Mems in about 2-4 weeks to see how he is doing Does not want to increase dose at this time, wants to wait until f/u when can decide at that point Discussed what to do if starts to have SI/HI and pt understands

## 2014-09-27 ENCOUNTER — Other Ambulatory Visit: Payer: Self-pay | Admitting: Family Medicine

## 2014-09-30 ENCOUNTER — Other Ambulatory Visit: Payer: Self-pay | Admitting: Family Medicine

## 2014-10-01 NOTE — Telephone Encounter (Signed)
Refusing keppra request given this was rx'ed by PCP 09/19/14 with 2 refills and [facility].  Hilton Sinclair, MD

## 2014-10-20 ENCOUNTER — Encounter: Payer: Self-pay | Admitting: Family Medicine

## 2014-10-20 ENCOUNTER — Ambulatory Visit (INDEPENDENT_AMBULATORY_CARE_PROVIDER_SITE_OTHER): Payer: Medicare Other | Admitting: Family Medicine

## 2014-10-20 VITALS — BP 143/101 | HR 76 | Temp 98.2°F | Ht 68.0 in | Wt 169.0 lb

## 2014-10-20 DIAGNOSIS — K7030 Alcoholic cirrhosis of liver without ascites: Secondary | ICD-10-CM

## 2014-10-20 DIAGNOSIS — K5900 Constipation, unspecified: Secondary | ICD-10-CM | POA: Diagnosis not present

## 2014-10-20 DIAGNOSIS — Z8673 Personal history of transient ischemic attack (TIA), and cerebral infarction without residual deficits: Secondary | ICD-10-CM | POA: Diagnosis not present

## 2014-10-20 DIAGNOSIS — K769 Liver disease, unspecified: Secondary | ICD-10-CM | POA: Diagnosis not present

## 2014-10-20 DIAGNOSIS — Z1211 Encounter for screening for malignant neoplasm of colon: Secondary | ICD-10-CM | POA: Diagnosis not present

## 2014-10-20 DIAGNOSIS — Z114 Encounter for screening for human immunodeficiency virus [HIV]: Secondary | ICD-10-CM

## 2014-10-20 DIAGNOSIS — I10 Essential (primary) hypertension: Secondary | ICD-10-CM | POA: Diagnosis not present

## 2014-10-20 DIAGNOSIS — K7469 Other cirrhosis of liver: Secondary | ICD-10-CM

## 2014-10-20 DIAGNOSIS — F068 Other specified mental disorders due to known physiological condition: Secondary | ICD-10-CM | POA: Diagnosis not present

## 2014-10-20 DIAGNOSIS — Z1212 Encounter for screening for malignant neoplasm of rectum: Secondary | ICD-10-CM

## 2014-10-20 DIAGNOSIS — F321 Major depressive disorder, single episode, moderate: Secondary | ICD-10-CM

## 2014-10-20 DIAGNOSIS — F09 Unspecified mental disorder due to known physiological condition: Secondary | ICD-10-CM

## 2014-10-20 MED ORDER — POLYETHYLENE GLYCOL 3350 17 GM/SCOOP PO POWD: 17.0000 g | Freq: Every day | ORAL | Status: DC | PRN

## 2014-10-20 MED ORDER — SERTRALINE HCL 100 MG PO TABS: 100.0000 mg | ORAL_TABLET | Freq: Every day | ORAL | Status: DC

## 2014-10-20 MED ORDER — AMLODIPINE BESYLATE 5 MG PO TABS: 5.0000 mg | ORAL_TABLET | Freq: Every day | ORAL | Status: DC

## 2014-10-20 NOTE — Patient Instructions (Signed)
Increase Zoloft to 100 mg daily Start amlodipine 5 mg daily for blood pressure Start miralax 17 g daily as needed for constipation I am referring you to GI for your colonoscopy. You will get a phone call to schedule this appointment.  We will check bloodwork at a lab appointment fasting Follow-up with me in one month  Be well, Dr. Ardelia Mems

## 2014-10-20 NOTE — Progress Notes (Signed)
Patient ID: [name]., male   DOB: 04/29/1956, 59 y.o.   MRN: [id number]  HPI:  Depression: Feeling better overall. Has been taking Zoloft. Still has occasional thoughts of self-harm, but denies having any plans to harm himself. He states very rationally that this "is not going to go away overnight". He has no plans of harming others. His mood feels better. He does have good him bad days. He's tolerating the Zoloft well.  Hypertension: Has been taking carvedilol 3.125 mg twice a day.  Constipation: Has had this for 3-4 months since stopping his Creon. He denies having any blood in his stool. He has a bowel movement about 1-2 times per week. Had to stop Creon because he was not able to afford it.  ROS: See HPI.  [place]: History of alcoholic cirrhosis, anoxic brain damage, hypertension, seizures, depression and anxiety  PHYSICAL EXAM: BP 143/101 mmHg  Pulse 76  Temp(Src) 98.2 F (36.8 C) (Oral)  Ht 5\' 8"  (1.727 m)  Wt 169 lb (76.658 kg)  BMI 25.70 kg/m2 Gen: NAD, pleasant, cooperative HEENT: NCAT Heart: RRR no murmurs Lungs: CTAB, NWOB Neuro: at pt's baseline. Grossly nonfocal other than R extremity contractures Abdomen: Soft, nontender to palpation Ext: No appreciable lower extremity edema bilaterally Psych: normal range of affect, well groomed, speech normal in rate and volume, normal eye contact   ASSESSMENT/PLAN:  Health maintenance:  -Referring to GI for screening colonoscopy  Major depression, single episode Improving. No active SI or HI, no plans to harm himself. Will increase Zoloft to 100 mg daily. Follow-up in 4 weeks for further evaluation.   HYPERTENSION, BENIGN BP remains elevated. Will add amlodipine 5 mg daily. Repeat blood pressure check in one month.   Constipation Doubt this is related to having stopped Creon. Abdominal exam benign today. Will start miralax. Past due for colonoscopy, so we'll refer to GI to have this set up.   CIRRHOSIS,  ALCOHOLIC Noted on history. Will check hep C, HIV, and coags to evaluate for synthetic liver function. Also check CMET, lipid panel, and CBC. Patient will return for these labs as a fasting lab draw.    FOLLOW UP: F/u in one month for follow-up of depression, hypertension, and constipation. Schedule fasting lab appointment  [place] Ardelia Mems, [place]

## 2014-10-21 ENCOUNTER — Telehealth: Payer: Self-pay | Admitting: Family Medicine

## 2014-10-21 NOTE — Telephone Encounter (Signed)
I cannot declare this on behalf of the patient. Please call patient and instruct him that if he so chooses, he can call the GI office himself to give his sister permission to make and cancel his appointments.  Leeanne Rio, MD

## 2014-10-21 NOTE — Telephone Encounter (Signed)
Sister called because when she tried to make the appointment for her brother at the GI they would not let her or even talk to about her brother. She would like Dr. Ardelia Mems to call them and tell them that they can talk to her and tell her anything and that she can make and cancel his appointment. jw

## 2014-10-22 NOTE — Telephone Encounter (Signed)
LVM with message from MD, instructed to call back with any questions.

## 2014-10-26 ENCOUNTER — Other Ambulatory Visit: Payer: Self-pay | Admitting: Family Medicine

## 2014-10-26 DIAGNOSIS — K5900 Constipation, unspecified: Secondary | ICD-10-CM | POA: Insufficient documentation

## 2014-10-26 NOTE — Assessment & Plan Note (Addendum)
Improving. No active SI or HI, no plans to harm himself. Will increase Zoloft to 100 mg daily. Follow-up in 4 weeks for further evaluation.

## 2014-10-26 NOTE — Assessment & Plan Note (Signed)
Doubt this is related to having stopped Creon. Abdominal exam benign today. Will start miralax. Past due for colonoscopy, so we'll refer to GI to have this set up.

## 2014-10-26 NOTE — Assessment & Plan Note (Signed)
Noted on history. Will check hep C, HIV, and coags to evaluate for synthetic liver function. Also check CMET, lipid panel, and CBC. Patient will return for these labs as a fasting lab draw.

## 2014-10-26 NOTE — Assessment & Plan Note (Signed)
BP remains elevated. Will add amlodipine 5 mg daily. Repeat blood pressure check in one month.

## 2014-10-30 ENCOUNTER — Other Ambulatory Visit: Payer: Self-pay | Admitting: Family Medicine

## 2014-11-05 ENCOUNTER — Encounter: Payer: Self-pay | Admitting: Internal Medicine

## 2014-11-13 ENCOUNTER — Ambulatory Visit (AMBULATORY_SURGERY_CENTER): Payer: Self-pay | Admitting: *Deleted

## 2014-11-13 VITALS — Ht 68.0 in | Wt 166.2 lb

## 2014-11-13 DIAGNOSIS — Z1211 Encounter for screening for malignant neoplasm of colon: Secondary | ICD-10-CM

## 2014-11-13 MED ORDER — MOVIPREP 100 G PO SOLR: 1.0000 | Freq: Once | ORAL | Status: DC

## 2014-11-13 NOTE — Progress Notes (Signed)
No egg or soy allergy No diet pills No home 02 use Pt states when he had pancreas stent he tried to get up and had a fall and cut his eye post op and he doesn't remember  Pt declined emmi video Pt uses a cane , has no use of right arm due to stroke. Pt states he does dress and undress self.

## 2014-11-24 ENCOUNTER — Emergency Department (HOSPITAL_COMMUNITY)
Admission: EM | Admit: 2014-11-24 | Discharge: 2014-11-24 | Disposition: A | Discharge status: Home / Self Care | Payer: Medicare Other | Attending: Emergency Medicine | Admitting: Emergency Medicine

## 2014-11-24 ENCOUNTER — Ambulatory Visit: Payer: Medicare Other | Admitting: Family Medicine

## 2014-11-24 ENCOUNTER — Encounter (HOSPITAL_COMMUNITY): Payer: Self-pay | Admitting: Emergency Medicine

## 2014-11-24 DIAGNOSIS — Z79899 Other long term (current) drug therapy: Secondary | ICD-10-CM | POA: Diagnosis not present

## 2014-11-24 DIAGNOSIS — D649 Anemia, unspecified: Secondary | ICD-10-CM | POA: Insufficient documentation

## 2014-11-24 DIAGNOSIS — M1990 Unspecified osteoarthritis, unspecified site: Secondary | ICD-10-CM | POA: Insufficient documentation

## 2014-11-24 DIAGNOSIS — F329 Major depressive disorder, single episode, unspecified: Secondary | ICD-10-CM | POA: Diagnosis not present

## 2014-11-24 DIAGNOSIS — R000 Tachycardia, unspecified: Secondary | ICD-10-CM | POA: Insufficient documentation

## 2014-11-24 DIAGNOSIS — Z8673 Personal history of transient ischemic attack (TIA), and cerebral infarction without residual deficits: Secondary | ICD-10-CM | POA: Insufficient documentation

## 2014-11-24 DIAGNOSIS — F419 Anxiety disorder, unspecified: Secondary | ICD-10-CM | POA: Insufficient documentation

## 2014-11-24 DIAGNOSIS — R569 Unspecified convulsions: Secondary | ICD-10-CM

## 2014-11-24 DIAGNOSIS — J449 Chronic obstructive pulmonary disease, unspecified: Secondary | ICD-10-CM | POA: Insufficient documentation

## 2014-11-24 DIAGNOSIS — Z720 Tobacco use: Secondary | ICD-10-CM | POA: Diagnosis not present

## 2014-11-24 DIAGNOSIS — E119 Type 2 diabetes mellitus without complications: Secondary | ICD-10-CM | POA: Insufficient documentation

## 2014-11-24 DIAGNOSIS — G8929 Other chronic pain: Secondary | ICD-10-CM | POA: Insufficient documentation

## 2014-11-24 DIAGNOSIS — G40909 Epilepsy, unspecified, not intractable, without status epilepticus: Secondary | ICD-10-CM | POA: Diagnosis present

## 2014-11-24 DIAGNOSIS — I252 Old myocardial infarction: Secondary | ICD-10-CM | POA: Diagnosis not present

## 2014-11-24 DIAGNOSIS — Z8719 Personal history of other diseases of the digestive system: Secondary | ICD-10-CM | POA: Insufficient documentation

## 2014-11-24 DIAGNOSIS — Z8701 Personal history of pneumonia (recurrent): Secondary | ICD-10-CM | POA: Diagnosis not present

## 2014-11-24 DIAGNOSIS — I10 Essential (primary) hypertension: Secondary | ICD-10-CM | POA: Insufficient documentation

## 2014-11-24 LAB — CBC WITH DIFFERENTIAL/PLATELET
Basophils Absolute: 0 10*3/uL (ref 0.0–0.1)
Basophils Relative: 0 % (ref 0–1)
Eosinophils Absolute: 0.1 10*3/uL (ref 0.0–0.7)
Eosinophils Relative: 1 % (ref 0–5)
HCT: 41.1 % (ref 39.0–52.0)
Hemoglobin: 13.3 g/dL (ref 13.0–17.0)
Lymphocytes Relative: 15 % (ref 12–46)
Lymphs Abs: 1.5 10*3/uL (ref 0.7–4.0)
MCH: 31.1 pg (ref 26.0–34.0)
MCHC: 32.4 g/dL (ref 30.0–36.0)
MCV: 96.3 fL (ref 78.0–100.0)
Monocytes Absolute: 0.4 10*3/uL (ref 0.1–1.0)
Monocytes Relative: 4 % (ref 3–12)
Neutro Abs: 8.3 10*3/uL — ABNORMAL HIGH (ref 1.7–7.7)
Neutrophils Relative %: 80 % — ABNORMAL HIGH (ref 43–77)
Platelets: 252 10*3/uL (ref 150–400)
RBC: 4.27 MIL/uL (ref 4.22–5.81)
RDW: 13.4 % (ref 11.5–15.5)
WBC: 10.3 10*3/uL (ref 4.0–10.5)

## 2014-11-24 LAB — COMPREHENSIVE METABOLIC PANEL
ALT: 35 U/L (ref 0–53)
AST: 49 U/L — ABNORMAL HIGH (ref 0–37)
Albumin: 4.3 g/dL (ref 3.5–5.2)
Alkaline Phosphatase: 81 U/L (ref 39–117)
Anion gap: 22 — ABNORMAL HIGH (ref 5–15)
BUN: 17 mg/dL (ref 6–23)
CO2: 10 mmol/L — CL (ref 19–32)
Calcium: 9.3 mg/dL (ref 8.4–10.5)
Chloride: 102 mmol/L (ref 96–112)
Creatinine, Ser: 1.63 mg/dL — ABNORMAL HIGH (ref 0.50–1.35)
GFR calc Af Amer: 52 mL/min — ABNORMAL LOW (ref >90)
GFR calc non Af Amer: 45 mL/min — ABNORMAL LOW (ref >90)
Glucose, Bld: 151 mg/dL — ABNORMAL HIGH (ref 70–99)
Potassium: 5.3 mmol/L — ABNORMAL HIGH (ref 3.5–5.1)
Sodium: 134 mmol/L — ABNORMAL LOW (ref 135–145)
Total Bilirubin: 0.8 mg/dL (ref 0.3–1.2)
Total Protein: 7.7 g/dL (ref 6.0–8.3)

## 2014-11-24 LAB — I-STAT CG4 LACTIC ACID, ED
Lactic Acid, Venous: 15.83 mmol/L (ref 0.5–2.0)
Lactic Acid, Venous: 4.38 mmol/L (ref 0.5–2.0)

## 2014-11-24 LAB — BASIC METABOLIC PANEL
Anion gap: 10 (ref 5–15)
BUN: 17 mg/dL (ref 6–23)
CO2: 18 mmol/L — ABNORMAL LOW (ref 19–32)
Calcium: 8.8 mg/dL (ref 8.4–10.5)
Chloride: 107 mmol/L (ref 96–112)
Creatinine, Ser: 1.28 mg/dL (ref 0.50–1.35)
GFR calc Af Amer: 70 mL/min — ABNORMAL LOW (ref >90)
GFR calc non Af Amer: 61 mL/min — ABNORMAL LOW (ref >90)
Glucose, Bld: 124 mg/dL — ABNORMAL HIGH (ref 70–99)
Potassium: 4.6 mmol/L (ref 3.5–5.1)
Sodium: 135 mmol/L (ref 135–145)

## 2014-11-24 MED ORDER — LORAZEPAM 1 MG PO TABS: 1.0000 mg | ORAL_TABLET | Freq: Three times a day (TID) | ORAL | Status: AC

## 2014-11-24 MED ORDER — LORAZEPAM 2 MG/ML IJ SOLN
1.0000 mg | Freq: Once | INTRAMUSCULAR | Status: AC
  Administered 2014-11-24: 1 mg via INTRAVENOUS
  Filled 2014-11-24: qty 1

## 2014-11-24 MED ORDER — SODIUM CHLORIDE 0.9 % IV BOLUS (SEPSIS)
1000.0000 mL | Freq: Once | INTRAVENOUS | Status: AC
  Administered 2014-11-24: 1000 mL via INTRAVENOUS

## 2014-11-24 MED ORDER — LACTATED RINGERS IV BOLUS (SEPSIS)
1000.0000 mL | Freq: Once | INTRAVENOUS | Status: AC
  Administered 2014-11-24: 1000 mL via INTRAVENOUS

## 2014-11-24 NOTE — ED Notes (Signed)
Pt's brother at bedside.

## 2014-11-24 NOTE — ED Notes (Signed)
Pt to ED via [facility] after reported having witnessed seizure.  Pt takes Keppra for seizures and st;'s he has not missed any meds. On arrival to ED pt alert and oriented x's 3.

## 2014-11-24 NOTE — ED Notes (Addendum)
Lab called with abnormal result of CO2 of 10, Dr Vanita Panda in room and notified.

## 2014-11-24 NOTE — ED Provider Notes (Signed)
CSN: [id number]     Arrival date & time 11/24/14  1918 History   First MD Initiated Contact with Patient 11/24/14 1919     Chief Complaint  Patient presents with  . Seizures     HPI  Patient presents after a witnessed seizure. EMS is here, we discussed his presentation. Per report patient called EMS due to myoclonic activity in the right upper extremity. Soon after EMS arrival the patient had generalized seizure activity for several moments, persistent postictal phase to arrival in the emergency department. EMS does not report that the patient fell and struck anything, or any other trauma. EMS did not provide medication en route. Here the patient is answering questions briefly, seemingly correctly, but cannot elaborate on anything beyond brief responses. Level V caveat  Past Medical History  Diagnosis Date  . Angioedema     rx requiring intubation/vent support suspected secondary to ACE1 (but also on zithromax) 5/09 hospitalization   . Smoker   . ETOH abuse   . Chronic pancreatitis     (10/08 hops). admx 02/2010 pseudocyst aspirated during admxn. pancreatic dual stent placement at [facility] 11/11  . 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   . Gastritis     alcohol induced  . Depression     no hx of meds  . VF (ventricular fibrillation) 2011    arrest 6/11. successfully rescucitated w/prolinged hospitalization due to respitatory failure. QT prolongation during cooling, now resolved  . History of stroke 2011    reports it was caused by pain from pancreatitis- caused heart to stop and stroke   . 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 Anxiety   . Edema   . 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 Anoxic brain damage   . Anemia   . Hypertension 2011  . Arthritis   . Chronic pain 2011  . Hyperlipidemia 2011  . Inguinal hernia 2011  . COPD (chronic obstructive pulmonary disease)   . GERD (gastroesophageal reflux  disease)   . Shortness of breath     exertion  . Pneumonia     hx of  . Stroke     right sided weakness; affected long term memory  . Seizures 2011    none within last year   . Diabetes mellitus without complication     Type 2, pt reports no longer takes meds & A1C was normal.  . Myocardial infarction    Past Surgical History  Procedure Laterality Date  . Right wrist orif  1976    for fx  . Pancreatic stent placement    . Inguinal hernia repair  1960    right  . Inguinal hernia repair Left 07/12/2013    Procedure: HERNIA REPAIR INGUINAL ADULT;  Surgeon: Adin Hector, MD;  Location: [facility];  Service: General;  Laterality: Left;  . Insertion of mesh Left 07/12/2013    Procedure: INSERTION OF MESH;  Surgeon: Adin Hector, MD;  Location: [facility];  Service: General;  Laterality: Left;   Family History  Problem Relation Age of Onset  . Coronary artery disease Neg Hx   . Colon cancer Neg Hx   . Alcohol abuse Mother   . Alcohol abuse Father   . Alcohol abuse    . Early death Mother   . Diabetes Sister   . Hyperlipidemia Sister   . Hypertension Sister   . Prostate cancer Father 59  . Breast  cancer Paternal Grandmother    History  Substance Use Topics  . Smoking status: Current Every Day Smoker -- 0.25 packs/day    Types: Cigarettes  . Smokeless tobacco: Never Used  . Alcohol Use: 0.0 oz/week    0 Standard drinks or equivalent per week     Comment: hx of alcohol abuse, every 2-3 weeks beer on and off     Review of Systems  Unable to perform ROS: Acuity of condition  Patient is post-ictal    Allergies  Azithromycin and Lisinopril  Home Medications   Prior to Admission medications   Medication Sig Start Date End Date Taking? Authorizing Provider  amLODipine (NORVASC) 5 MG tablet Take 1 tablet (5 mg total) by mouth daily. 10/20/14   Leeanne Rio, MD  carvedilol (COREG) 3.125 MG tablet Take 1 tablet (3.125 mg total) by mouth 2 (two) times daily with a meal.  09/19/14   Leeanne Rio, MD  [employer] B-12 1000 MCG TBCR TAKE 1 TABLET BY MOUTH DAILY Patient not taking: Reported on 11/13/2014    Leeanne Rio, MD  folic acid (FOLVITE) 1 MG tablet TAKE 1 TABLET BY MOUTH DAILY Patient not taking: Reported on 11/13/2014    Leeanne Rio, MD  levETIRAcetam (KEPPRA) 1000 MG tablet Take 1 tablet (1,000 mg total) by mouth 2 (two) times daily. 09/19/14   Leeanne Rio, MD  MOVIPREP 100 G SOLR Take 1 kit (200 g total) by mouth once. moviprep as directed. No substitutions 11/13/14   Irene Shipper, MD  Naproxen Sodium (ALEVE PO) Take 2 tablets by mouth as needed.    Historical Provider, MD  polyethylene glycol powder (GLYCOLAX/MIRALAX) powder Take 17 g by mouth daily as needed. 10/20/14   Leeanne Rio, MD  sertraline (ZOLOFT) 100 MG tablet Take 1 tablet (100 mg total) by mouth daily. 10/20/14   Leeanne Rio, MD  thiamine (VITAMIN B-1) 100 MG tablet Take 1 tablet (100 mg total) by mouth daily. Patient not taking: Reported on 11/13/2014 12/25/12   Jacquelyn A McGill, MD  tiZANidine (ZANAFLEX) 4 MG tablet TAKE 2 TABLETS (8 MG TOTAL) BY MOUTH EVERY 6 (SIX) HOURS AS NEEDED. 04/03/14   Charlett Blake, MD   BP 106/71 mmHg  Pulse 79  Temp(Src) 97.6 F (36.4 C) (Oral)  Resp 17  Ht 5' 9"  (1.753 m)  Wt 166 lb (75.297 kg)  BMI 24.50 kg/m2  SpO2 92% Physical Exam  Constitutional:  Patient is tremulous, mumbling.  HENT:  Head: Normocephalic and atraumatic.  Neck: No tracheal deviation present.  Cardiovascular: Tachycardia present.   Pulmonary/Chest: No stridor. Tachypnea noted.  Nursing note and vitals reviewed.   ED Course  Procedures (including critical care time) Labs Review Labs Reviewed  COMPREHENSIVE METABOLIC PANEL - Abnormal; Notable for the following:    Sodium 134 (*)    Potassium 5.3 (*)    CO2 10 (*)    Glucose, Bld 151 (*)    Creatinine, Ser 1.63 (*)    AST 49 (*)    GFR calc non Af Amer 45 (*)    GFR calc Af Amer 52  (*)    Anion gap 22 (*)    All other components within normal limits  CBC WITH DIFFERENTIAL/PLATELET - Abnormal; Notable for the following:    Neutrophils Relative % 80 (*)    Neutro Abs 8.3 (*)    All other components within normal limits  I-STAT CG4 LACTIC ACID, ED - Abnormal; Notable for  the following:    Lactic Acid, Venous 15.83 (*)    All other components within normal limits      EKG Interpretation   Date/Time:  Monday November 24 2014 19:21:13 EST Ventricular Rate:  94 PR Interval:  188 QRS Duration: 84 QT Interval:  362 QTC Calculation: 453 R Axis:   35 Text Interpretation:  Sinus rhythm Ventricular premature complex Sinus  rhythm Premature ventricular complexes Abnormal ekg Confirmed by Carmin Muskrat  MD [phone]) on 11/24/2014 8:59:46 PM      Initial labs notable for lactic acidosis 15 Patient had received fluid resuscitation initially, this will continue.   On repeat exam the patient remains tremulous, tachycardic, tachypnea, is answering questions better, but only briefly.  Update: Patient remains tachycardic, tachypneic.  Update: Patient appears substantially calm her. He and his brother, now present, voices understanding of all initial findings. Patient is receiving a second fluid bolus.  MDM   Patient presents after a witnessed seizure. Patient has a seizure disorder and states that he is compliant with his Keppra dosing. All relevant answers were provided after the patient's course from a prolonged postictal phase. Notable, the patient was initially tachypneic, tachycardic, diaphoretic, and had substantial elevation in lactic acid level. Labs also notable for elevated creatinine, potassium. After 2 L of fluid resuscitation the patient's lactic acid level decreased, creatinine decreased, potassium decreased. Patient was encouraged to continue take all medication as directed, provided additional neurology follow-up options, discharged in stable  condition.    Carmin Muskrat, MD 11/24/14 [phone]

## 2014-11-24 NOTE — Discharge Instructions (Signed)
As discussed, it is important that you follow-up with a neurologist to discuss your seizure disorder and appropriate medications for control.  If you are unable to see your previous physician, please be sure to call our neurologist.  Return here for concerning changes in your condition.

## 2014-11-24 NOTE — ED Notes (Signed)
Dr.Lockwood at bedside  

## 2014-11-24 NOTE — ED Notes (Signed)
Pt hyperventilating, encouraged pt to slow breathing down

## 2014-11-24 NOTE — ED Notes (Signed)
Pt starting to have tremors in right arm.  Pt st's this usually happens before he has a seizure

## 2014-11-24 NOTE — ED Notes (Addendum)
Pt resting quietly at this time.  Brother remains at bedside

## 2014-11-26 ENCOUNTER — Encounter: Payer: Self-pay | Admitting: Internal Medicine

## 2014-11-26 ENCOUNTER — Ambulatory Visit (AMBULATORY_SURGERY_CENTER): Payer: Medicare Other | Admitting: Internal Medicine

## 2014-11-26 VITALS — BP 119/72 | HR 65 | Temp 98.2°F | Resp 15 | Ht 68.0 in | Wt 166.0 lb

## 2014-11-26 DIAGNOSIS — D122 Benign neoplasm of ascending colon: Secondary | ICD-10-CM

## 2014-11-26 DIAGNOSIS — Z8673 Personal history of transient ischemic attack (TIA), and cerebral infarction without residual deficits: Secondary | ICD-10-CM | POA: Diagnosis not present

## 2014-11-26 DIAGNOSIS — I2510 Atherosclerotic heart disease of native coronary artery without angina pectoris: Secondary | ICD-10-CM | POA: Diagnosis not present

## 2014-11-26 DIAGNOSIS — J449 Chronic obstructive pulmonary disease, unspecified: Secondary | ICD-10-CM | POA: Diagnosis not present

## 2014-11-26 DIAGNOSIS — Z1211 Encounter for screening for malignant neoplasm of colon: Secondary | ICD-10-CM | POA: Diagnosis not present

## 2014-11-26 MED ORDER — SODIUM CHLORIDE 0.9 % IV SOLN: 500.0000 mL | INTRAVENOUS | Status: DC

## 2014-11-26 NOTE — Patient Instructions (Signed)
Impressions/recommendations:  Polyp (handout given) Diverticulosis (handout given) High Fiber Diet (handout given)  Repeat colonoscopy pending pathology results.  YOU HAD AN ENDOSCOPIC PROCEDURE TODAY AT [facility]: Refer to the procedure report that was given to you for any specific questions about what was found during the examination.  If the procedure report does not answer your questions, please call your gastroenterologist to clarify.  If you requested that your care partner not be given the details of your procedure findings, then the procedure report has been included in a sealed envelope for you to review at your convenience later.  YOU SHOULD EXPECT: Some feelings of bloating in the abdomen. Passage of more gas than usual.  Walking can help get rid of the air that was put into your GI tract during the procedure and reduce the bloating. If you had a lower endoscopy (such as a colonoscopy or flexible sigmoidoscopy) you may notice spotting of blood in your stool or on the toilet paper. If you underwent a bowel prep for your procedure, then you may not have a normal bowel movement for a few days.  DIET: Your first meal following the procedure should be a light meal and then it is ok to progress to your normal diet.  A half-sandwich or bowl of soup is an example of a good first meal.  Heavy or fried foods are harder to digest and may make you feel nauseous or bloated.  Likewise meals heavy in dairy and vegetables can cause extra gas to form and this can also increase the bloating.  Drink plenty of fluids but you should avoid alcoholic beverages for 24 hours.  ACTIVITY: Your care partner should take you home directly after the procedure.  You should plan to take it easy, moving slowly for the rest of the day.  You can resume normal activity the day after the procedure however you should NOT DRIVE or use heavy machinery for 24 hours (because of the sedation medicines used during  the test).    SYMPTOMS TO REPORT IMMEDIATELY: A gastroenterologist can be reached at any hour.  During normal business hours, 8:30 AM to 5:00 PM Monday through Friday, call [phone].  After hours and on weekends, please call the GI answering service at [phone] who will take a message and have the physician on call contact you.   Following lower endoscopy (colonoscopy or flexible sigmoidoscopy):  Excessive amounts of blood in the stool  Significant tenderness or worsening of abdominal pains  Swelling of the abdomen that is new, acute  Fever of 100F or higher  FOLLOW UP: If any biopsies were taken you will be contacted by phone or by letter within the next 1-3 weeks.  Call your gastroenterologist if you have not heard about the biopsies in 3 weeks.  Our staff will call the home number listed on your records the next business day following your procedure to check on you and address any questions or concerns that you may have at that time regarding the information given to you following your procedure. This is a courtesy call and so if there is no answer at the home number and we have not heard from you through the emergency physician on call, we will assume that you have returned to your regular daily activities without incident.  SIGNATURES/CONFIDENTIALITY: You and/or your care partner have signed paperwork which will be entered into your electronic medical record.  These signatures attest to the fact that that the information  above on your After Visit Summary has been reviewed and is understood.  Full responsibility of the confidentiality of this discharge information lies with you and/or your care-partner. 

## 2014-11-26 NOTE — Op Note (Signed)
[facility]  [address] [place], [address]   COLONOSCOPY PROCEDURE REPORT  PATIENT: Steven Frey, Steven Frey  MR#: [id number] BIRTHDATE: 30-May-1956 , 58  yrs. old GENDER: male ENDOSCOPIST: Eustace Quail, MD REFERRED [id number] Ardelia Mems, M.D. ?"[facility] PROCEDURE DATE:  11/26/2014 PROCEDURE:   Colonoscopy with snare polypectomy First Screening Colonoscopy - Avg.  risk and is 50 yrs.  old or older Yes.  Prior Negative Screening - Now for repeat screening. N/A  History of Adenoma - Now for follow-up colonoscopy & has been > or = to 3 yrs.  N/A  Polyps Removed Today? Yes. ASA CLASS:   Class III INDICATIONS:average risk patient for colorectal cancer. MEDICATIONS: Monitored anesthesia care and Propofol 200 mg IV  DESCRIPTION OF PROCEDURE:   After the risks benefits and alternatives of the procedure were thoroughly explained, informed consent was obtained.  The digital rectal exam revealed no abnormalities of the rectum.   The LB [id number]  endoscope was introduced through the anus and advanced to the cecum, which was identified by both the appendix and ileocecal valve. No adverse events experienced.   The quality of the prep was excellent, using MoviPrep  The instrument was then slowly withdrawn as the colon was fully examined.   COLON FINDINGS: A single polyp measuring 5 mm in size was found in the ascending colon.  A polypectomy was performed with a cold snare.  The resection was complete, the polyp tissue was completely retrieved and sent to histology.   There was moderate diverticulosis noted throughout the entire examined colon.   The examination was otherwise normal.  Retroflexed views revealed no abnormalities. The time to cecum=2 minutes 10 seconds.  Withdrawal time=9 minutes 02 seconds.  The scope was withdrawn and the procedure completed. COMPLICATIONS: There were no immediate complications.  ENDOSCOPIC IMPRESSION: 1.   Single polyp  measuring 5 mm in size was found in the ascending colon; polypectomy was performed with a cold snare 2.   Moderate diverticulosis was noted throughout the entire examined colon 3.   The examination was otherwise normal  RECOMMENDATIONS: 1. Repeat colonoscopy in 5 years if polyp adenomatous; otherwise 10 years (if medically fit)  eSigned:  Eustace Quail, MD 11/26/2014 1:47 PM   cc: The Patient   , Chrisandra Netters M.D. (family practice)

## 2014-11-26 NOTE — Progress Notes (Signed)
Patient denies any allergies to eggs and soy.

## 2014-11-26 NOTE — Progress Notes (Signed)
Per sister and patient, patient had seizure on Monday and went to ED. Took seizure medication this morning per patient.

## 2014-11-26 NOTE — Progress Notes (Signed)
A/ox3 pleased with MAC, report to Tracy RN 

## 2014-11-26 NOTE — Progress Notes (Signed)
Called to room to assist during endoscopic procedure.  Patient ID and intended procedure confirmed with present staff. Received instructions for my participation in the procedure from the performing physician.  

## 2014-11-27 ENCOUNTER — Telehealth: Payer: Self-pay | Admitting: *Deleted

## 2014-11-27 NOTE — Telephone Encounter (Signed)
  Follow up Call-  Call back number 11/26/2014  Post procedure Call Back phone  # [phone]  Permission to leave phone message Yes    [facility]

## 2014-11-30 ENCOUNTER — Other Ambulatory Visit: Payer: Self-pay | Admitting: Family Medicine

## 2014-12-01 ENCOUNTER — Telehealth: Payer: Self-pay | Admitting: Family Medicine

## 2014-12-01 NOTE — Telephone Encounter (Signed)
To [facility] red team - please call pt and let him know I have refilled his zoloft but he needs to schedule an appointment to follow up on his depression. His last appt had to be canceled due to the weather. Thanks!  Leeanne Rio, MD

## 2014-12-02 ENCOUNTER — Encounter: Payer: Self-pay | Admitting: Internal Medicine

## 2014-12-02 NOTE — Telephone Encounter (Signed)
Pt sister informed and also scheduled him a follow up appt. Nelle Sayed Kennon Holter, CMA

## 2014-12-17 ENCOUNTER — Ambulatory Visit: Payer: Medicare Other | Admitting: Family Medicine

## 2014-12-25 ENCOUNTER — Other Ambulatory Visit: Payer: Self-pay | Admitting: Family Medicine

## 2014-12-25 ENCOUNTER — Ambulatory Visit (INDEPENDENT_AMBULATORY_CARE_PROVIDER_SITE_OTHER): Payer: Medicare Other | Admitting: Family Medicine

## 2014-12-25 ENCOUNTER — Encounter: Payer: Self-pay | Admitting: Family Medicine

## 2014-12-25 ENCOUNTER — Ambulatory Visit (HOSPITAL_COMMUNITY)
Admission: RE | Admit: 2014-12-25 | Discharge: 2014-12-25 | Disposition: A | Discharge status: Home / Self Care | Payer: Medicare Other | Source: Ambulatory Visit | Attending: Family Medicine | Admitting: Family Medicine

## 2014-12-25 VITALS — BP 140/90 | HR 122 | Temp 98.2°F | Ht 68.0 in | Wt 152.6 lb

## 2014-12-25 DIAGNOSIS — G8110 Spastic hemiplegia affecting unspecified side: Secondary | ICD-10-CM

## 2014-12-25 DIAGNOSIS — R55 Syncope and collapse: Secondary | ICD-10-CM | POA: Insufficient documentation

## 2014-12-25 DIAGNOSIS — F32A Depression, unspecified: Secondary | ICD-10-CM

## 2014-12-25 DIAGNOSIS — M79641 Pain in right hand: Secondary | ICD-10-CM

## 2014-12-25 DIAGNOSIS — F321 Major depressive disorder, single episode, moderate: Secondary | ICD-10-CM

## 2014-12-25 DIAGNOSIS — R9431 Abnormal electrocardiogram [ECG] [EKG]: Secondary | ICD-10-CM | POA: Insufficient documentation

## 2014-12-25 DIAGNOSIS — F329 Major depressive disorder, single episode, unspecified: Secondary | ICD-10-CM

## 2014-12-25 MED ORDER — SERTRALINE HCL 100 MG PO TABS: ORAL_TABLET | ORAL | Status: DC

## 2014-12-25 MED ORDER — TIZANIDINE HCL 4 MG PO TABS: ORAL_TABLET | ORAL | Status: DC

## 2014-12-25 NOTE — Progress Notes (Signed)
Patient ID: Steven Read., male   DOB: Sep 18, 1956, 59 y.o.   MRN: [id number]  HPI:  Depression: Currently taking Zoloft 100 mg daily. Feels much better on this dose. Does not want to increase it is. Denies thoughts of harming himself or others. Very happy with this medicine.  Right hand pain: Patient reports that he fell approximately 2 weeks ago. He was standing outside smoking. He had a syncopal event. He did lose consciousness, and is not sure if he hit his head. He woke up immediately. This was witnessed by his friend. He did not have a seizure, even though he does have a history of a seizure disorder. He has not had any persistent headache. He has been taking Aleve 3 times a day for the right hand pain. His right hand is chronically contracted due to prior stroke. He denies any feelings of chest pain or shortness of breath, or heart palpitations prior to the syncopal event. This has never happened before. Right hand is swollen and sore.  ROS: See HPI.  [place]: Hypertension, hyperlipidemia, prior CVA with residual impairment and chronic contracture of right hand, depression, alcoholic cirrhosis  PHYSICAL EXAM: BP 140/90 mmHg  Pulse 122  Temp(Src) 98.2 F (36.8 C) (Oral)  Ht 5\' 8"  (1.727 m)  Wt 152 lb 9.6 oz (69.219 kg)  BMI 23.21 kg/m2 Gen: NAD, pleasant, cooperative Heart: RRR Lungs: CTAB, NWOB Neuro: grossly nonfocal, speech intact Ext: No appreciable lower extremity edema bilaterally. R hand chronically contracted but is also tender to palpation and mildly swollen. No warmth or redness. Speech at patient's baseline (some slurring) Psych: normal range of affect, well groomed, speech normal in rate and volume, normal eye contact   ASSESSMENT/PLAN:  Major depression, single episode Mood greatly improved on 100 mg daily of Zoloft. Continue this medication. Refills sent in.   Spastic hemiplegia affecting dominant side Patient has increased spasticity secondary to being out of  his tizanidine. I will refill this for him for one month until he can get back in with Dr. Read Drivers.   ARM PAIN, RIGHT Pain in right hand after fall from syncopal event. The hand is mildly swollen and tender. Exam is limited by his chronic contracture. I will obtain x-rays of his hand and wrist to evaluate for any fractures. Patient asked if there is anything else he can take for pain. He's been using Aleve 3 times a day. He apparently does not tolerate Tylenol. I cannot give him tramadol due to his history of seizures. I do not think that opioids are indicated at present. Recommend he continue occasional Aleve, and try not to use it if he can avoid it.   Syncope Etiology not clear by history. No known history of arrhythmias. EKG without obvious arrhythmia today other than occasional PVC. Plan: -TSH along with other labs which have already been ordered from last visit -Echo  If these are unrevealing, may need to obtain Holter monitor to evaluate for arrhythmia. F/u in 1 mo to ensure testing is done    FOLLOW UP: F/u in 1 month for above issues  [place] J. Ardelia Mems, [place]

## 2014-12-25 NOTE — Patient Instructions (Signed)
Come back soon one morning to get fasting labs done We will call you with an appointment for an ultrasound of your heart Refilled zoloft for you I sent in the tizanidine. Schedule an appt with Dr. Letta Pate for futher refills Follow up with me in 4 weeks on all this  Be well, Dr. Ardelia Mems

## 2014-12-26 ENCOUNTER — Other Ambulatory Visit: Payer: Medicare Other

## 2014-12-26 DIAGNOSIS — F09 Unspecified mental disorder due to known physiological condition: Secondary | ICD-10-CM

## 2014-12-26 DIAGNOSIS — K7469 Other cirrhosis of liver: Secondary | ICD-10-CM | POA: Diagnosis not present

## 2014-12-26 DIAGNOSIS — R55 Syncope and collapse: Secondary | ICD-10-CM

## 2014-12-26 DIAGNOSIS — F068 Other specified mental disorders due to known physiological condition: Secondary | ICD-10-CM

## 2014-12-26 DIAGNOSIS — Z8673 Personal history of transient ischemic attack (TIA), and cerebral infarction without residual deficits: Secondary | ICD-10-CM | POA: Diagnosis not present

## 2014-12-26 DIAGNOSIS — K769 Liver disease, unspecified: Secondary | ICD-10-CM | POA: Diagnosis not present

## 2014-12-26 DIAGNOSIS — F32A Depression, unspecified: Secondary | ICD-10-CM

## 2014-12-26 DIAGNOSIS — F329 Major depressive disorder, single episode, unspecified: Secondary | ICD-10-CM | POA: Diagnosis not present

## 2014-12-26 DIAGNOSIS — K7460 Unspecified cirrhosis of liver: Secondary | ICD-10-CM | POA: Diagnosis not present

## 2014-12-26 HISTORY — DX: Syncope and collapse: R55

## 2014-12-26 LAB — COMPREHENSIVE METABOLIC PANEL
ALT: 41 U/L (ref 0–53)
AST: 40 U/L — ABNORMAL HIGH (ref 0–37)
Albumin: 4.3 g/dL (ref 3.5–5.2)
Alkaline Phosphatase: 84 U/L (ref 39–117)
BUN: 20 mg/dL (ref 6–23)
CO2: 20 mEq/L (ref 19–32)
Calcium: 9.3 mg/dL (ref 8.4–10.5)
Chloride: 101 mEq/L (ref 96–112)
Creat: 1.03 mg/dL (ref 0.50–1.35)
Glucose, Bld: 149 mg/dL — ABNORMAL HIGH (ref 70–99)
Potassium: 4.4 mEq/L (ref 3.5–5.3)
Sodium: 137 mEq/L (ref 135–145)
Total Bilirubin: 1.4 mg/dL — ABNORMAL HIGH (ref 0.2–1.2)
Total Protein: 7.9 g/dL (ref 6.0–8.3)

## 2014-12-26 LAB — LIPID PANEL
Cholesterol: 240 mg/dL — ABNORMAL HIGH (ref 0–200)
HDL: 94 mg/dL (ref >=40)
LDL Cholesterol: 124 mg/dL — ABNORMAL HIGH (ref 0–99)
Total CHOL/HDL Ratio: 2.6 Ratio
Triglycerides: 111 mg/dL (ref <150)
VLDL: 22 mg/dL (ref 0–40)

## 2014-12-26 LAB — CBC
HCT: 39 % (ref 39.0–52.0)
Hemoglobin: 12.9 g/dL — ABNORMAL LOW (ref 13.0–17.0)
MCH: 31.3 pg (ref 26.0–34.0)
MCHC: 33.1 g/dL (ref 30.0–36.0)
MCV: 94.7 fL (ref 78.0–100.0)
MPV: 11.6 fL (ref 8.6–12.4)
Platelets: 261 10*3/uL (ref 150–400)
RBC: 4.12 MIL/uL — ABNORMAL LOW (ref 4.22–5.81)
RDW: 13.9 % (ref 11.5–15.5)
WBC: 8.8 10*3/uL (ref 4.0–10.5)

## 2014-12-26 LAB — TSH: TSH: 1.722 u[IU]/mL (ref 0.350–4.500)

## 2014-12-26 NOTE — Assessment & Plan Note (Signed)
Pain in right hand after fall from syncopal event. The hand is mildly swollen and tender. Exam is limited by his chronic contracture. I will obtain x-rays of his hand and wrist to evaluate for any fractures. Patient asked if there is anything else he can take for pain. He's been using Aleve 3 times a day. He apparently does not tolerate Tylenol. I cannot give him tramadol due to his history of seizures. I do not think that opioids are indicated at present. Recommend he continue occasional Aleve, and try not to use it if he can avoid it.

## 2014-12-26 NOTE — Assessment & Plan Note (Signed)
Patient has increased spasticity secondary to being out of his tizanidine. I will refill this for him for one month until he can get back in with Dr. Read Drivers.

## 2014-12-26 NOTE — Assessment & Plan Note (Signed)
Etiology not clear by history. No known history of arrhythmias. EKG without obvious arrhythmia today other than occasional PVC. Plan: -TSH along with other labs which have already been ordered from last visit -Echo  If these are unrevealing, may need to obtain Holter monitor to evaluate for arrhythmia. F/u in 1 mo to ensure testing is done

## 2014-12-26 NOTE — Assessment & Plan Note (Signed)
Mood greatly improved on 100 mg daily of Zoloft. Continue this medication. Refills sent in.

## 2014-12-26 NOTE — Progress Notes (Signed)
[facility] phlebotomist drew:  CMET, LIPID, TSH, HIV, CBC, ACUTE HEPATITIS PANEL, PROTIME/INR

## 2014-12-27 LAB — PROTIME-INR
INR: 1.07 (ref <1.50)
Prothrombin Time: 13.9 seconds (ref 11.6–15.2)

## 2014-12-27 LAB — HEPATITIS PANEL, ACUTE
HCV Ab: NEGATIVE
Hep A IgM: NONREACTIVE
Hep B C IgM: NONREACTIVE
Hepatitis B Surface Ag: NEGATIVE

## 2014-12-27 LAB — HIV ANTIBODY (ROUTINE TESTING W REFLEX): HIV 1&2 Ab, 4th Generation: NONREACTIVE

## 2014-12-30 ENCOUNTER — Other Ambulatory Visit: Payer: Self-pay | Admitting: Family Medicine

## 2014-12-31 ENCOUNTER — Ambulatory Visit (HOSPITAL_COMMUNITY)
Admission: RE | Admit: 2014-12-31 | Discharge: 2014-12-31 | Disposition: A | Discharge status: Home / Self Care | Payer: Medicare Other | Source: Ambulatory Visit | Attending: Family Medicine | Admitting: Family Medicine

## 2014-12-31 DIAGNOSIS — R55 Syncope and collapse: Secondary | ICD-10-CM | POA: Insufficient documentation

## 2014-12-31 DIAGNOSIS — S62616A Displaced fracture of proximal phalanx of right little finger, initial encounter for closed fracture: Secondary | ICD-10-CM | POA: Insufficient documentation

## 2014-12-31 DIAGNOSIS — E785 Hyperlipidemia, unspecified: Secondary | ICD-10-CM | POA: Diagnosis not present

## 2014-12-31 DIAGNOSIS — M24541 Contracture, right hand: Secondary | ICD-10-CM | POA: Insufficient documentation

## 2014-12-31 DIAGNOSIS — F17200 Nicotine dependence, unspecified, uncomplicated: Secondary | ICD-10-CM | POA: Diagnosis not present

## 2014-12-31 DIAGNOSIS — M79641 Pain in right hand: Secondary | ICD-10-CM

## 2014-12-31 DIAGNOSIS — M19041 Primary osteoarthritis, right hand: Secondary | ICD-10-CM | POA: Diagnosis not present

## 2014-12-31 DIAGNOSIS — W19XXXA Unspecified fall, initial encounter: Secondary | ICD-10-CM | POA: Diagnosis not present

## 2014-12-31 DIAGNOSIS — I10 Essential (primary) hypertension: Secondary | ICD-10-CM | POA: Insufficient documentation

## 2014-12-31 DIAGNOSIS — S6991XA Unspecified injury of right wrist, hand and finger(s), initial encounter: Secondary | ICD-10-CM | POA: Diagnosis not present

## 2014-12-31 DIAGNOSIS — Z8673 Personal history of transient ischemic attack (TIA), and cerebral infarction without residual deficits: Secondary | ICD-10-CM | POA: Insufficient documentation

## 2014-12-31 NOTE — Progress Notes (Signed)
Echocardiogram 2D Echocardiogram has been performed.  Steven Frey 12/31/2014, 3:44 PM

## 2015-01-01 ENCOUNTER — Telehealth: Payer: Self-pay | Admitting: Family Medicine

## 2015-01-01 ENCOUNTER — Encounter: Payer: Self-pay | Admitting: Family Medicine

## 2015-01-01 DIAGNOSIS — I5189 Other ill-defined heart diseases: Secondary | ICD-10-CM | POA: Insufficient documentation

## 2015-01-01 DIAGNOSIS — S62619A Displaced fracture of proximal phalanx of unspecified finger, initial encounter for closed fracture: Secondary | ICD-10-CM

## 2015-01-01 MED ORDER — ATORVASTATIN CALCIUM 40 MG PO TABS: 40.0000 mg | ORAL_TABLET | Freq: Every day | ORAL | Status: DC

## 2015-01-01 NOTE — Telephone Encounter (Addendum)
Called patient to discuss test results  1. X-ray showed fracture of proximal phalanx of fifth finger on right. Patient reports that his pain is manageable at this time. He does not have good sensation over his fingertips, but this is chronic due to his stroke. He denies any blueness of his fingers. Advised that I'm going to put in an urgent referral for him to be seen in orthopedic office early next week. Advised that if he does not hear from [place] on Monday, he should call for an appointment. Discussed red flags which should prompt him to go to the emergency room over the weekend.  2. Cholesterol is high, needs to be on cholesterol medication given known history of CVA and ASCVD risk score of over 20. Will prescribe Lipitor 40 mg daily. He does have a history of cirrhosis. I will recheck his LFTs and CBC in 2 months since he had very mild anemia.  3.  Echo didn't show any cause for syncope. Mild diastolic dysfunction.  4. General follow-up. He had not scheduled an appointment with me yet. I went ahead and scheduled him an appointment for April 7 to follow-up on all this.  Leeanne Rio, MD

## 2015-01-05 NOTE — Telephone Encounter (Signed)
Appointment scheduled for 3/29 at 10:30am at [facility], patient informed.

## 2015-01-06 DIAGNOSIS — S62606A Fracture of unspecified phalanx of right little finger, initial encounter for closed fracture: Secondary | ICD-10-CM | POA: Diagnosis not present

## 2015-01-15 ENCOUNTER — Ambulatory Visit: Payer: Medicare Other | Admitting: Family Medicine

## 2015-01-23 ENCOUNTER — Other Ambulatory Visit: Payer: Self-pay | Admitting: Family Medicine

## 2015-01-26 ENCOUNTER — Other Ambulatory Visit: Payer: Self-pay | Admitting: Family Medicine

## 2015-01-28 ENCOUNTER — Other Ambulatory Visit: Payer: Self-pay | Admitting: Family Medicine

## 2015-02-25 ENCOUNTER — Other Ambulatory Visit: Payer: Self-pay | Admitting: Family Medicine

## 2015-03-05 ENCOUNTER — Ambulatory Visit (INDEPENDENT_AMBULATORY_CARE_PROVIDER_SITE_OTHER): Payer: Medicare Other | Admitting: Family Medicine

## 2015-03-05 ENCOUNTER — Telehealth: Payer: Self-pay | Admitting: Family Medicine

## 2015-03-05 ENCOUNTER — Encounter: Payer: Self-pay | Admitting: Family Medicine

## 2015-03-05 VITALS — BP 142/83 | HR 101 | Temp 98.1°F | Ht 68.0 in | Wt 146.0 lb

## 2015-03-05 DIAGNOSIS — M7989 Other specified soft tissue disorders: Secondary | ICD-10-CM

## 2015-03-05 DIAGNOSIS — M79631 Pain in right forearm: Secondary | ICD-10-CM

## 2015-03-05 DIAGNOSIS — R739 Hyperglycemia, unspecified: Secondary | ICD-10-CM | POA: Diagnosis not present

## 2015-03-05 DIAGNOSIS — E785 Hyperlipidemia, unspecified: Secondary | ICD-10-CM | POA: Diagnosis not present

## 2015-03-05 DIAGNOSIS — G8110 Spastic hemiplegia affecting unspecified side: Secondary | ICD-10-CM

## 2015-03-05 DIAGNOSIS — F321 Major depressive disorder, single episode, moderate: Secondary | ICD-10-CM

## 2015-03-05 DIAGNOSIS — R55 Syncope and collapse: Secondary | ICD-10-CM

## 2015-03-05 MED ORDER — ATORVASTATIN CALCIUM 40 MG PO TABS: 40.0000 mg | ORAL_TABLET | Freq: Every day | ORAL | Status: DC

## 2015-03-05 NOTE — Assessment & Plan Note (Signed)
Noted after visit that last lab draw had glucose in the 140s. Will plan to do A1c at next appointment.

## 2015-03-05 NOTE — Assessment & Plan Note (Signed)
Stable, doing well on Zoloft. Continue this medication.

## 2015-03-05 NOTE — Assessment & Plan Note (Signed)
Increased spasm while all out of tizanidine. Encouraged patient to get tizanidine from his pharmacy. I have been prescribing this until he can get in with his physical medicine and rehabilitation physician. Instructed him to schedule an appointment with them.

## 2015-03-05 NOTE — Assessment & Plan Note (Signed)
No further syncopal events. We'll continue to monitor.

## 2015-03-05 NOTE — Progress Notes (Signed)
Patient ID: Steven Frey., male   DOB: May 28, 1956, 59 y.o.   MRN: [id number]  HPI:  R arm swelling: started a few days ago. Scab was there for some period of time and came off a few days ago. No drainage. Swollen for a few days. No fevers that he knows of. Sore with touching but not sore just sitting.  Right hand fracture: Was seen by orthopedics for right hand fracture after fall. Patient reports he was splinted for 30 days, and advised to follow-up if he was having any problems with it. He has not had any problems with his hand, and thus has not followed up. He is not wearing the splint today.  Syncope: No further syncopal episodes.  Depression: Denies any thoughts of harming himself or others. Taking Zoloft and tolerating this well. Wants to stand same dose.  Contractures: Has not followed up with his physical medicine and rehabilitation physician. I have been prescribing him tizanidine in the meantime. He has been out of this medicine for about 2 weeks. Has had increasing contractures of right arm while out of this medicine.  Hyperlipidemia: Has not yet gotten prescription for atorvastatin  ROS: See HPI.  [place]: History of hypertension, prior cardiac arrest, syncope, alcoholic cirrhosis, anoxic brain damage, spastic hemiplegia, prior CVA, hyperlipidemia  PHYSICAL EXAM: BP 142/83 mmHg  Pulse 101  Temp(Src) 98.1 F (36.7 C) (Oral)  Ht 5\' 8"  (1.727 m)  Wt 146 lb (66.225 kg)  BMI 22.20 kg/m2 Gen: No acute distress, pleasant, cooperative HEENT: Normocephalic, atraumatic Lungs: Normal respiratory effort Neuro: Chronic right contracture of hand, speech chronically broken but fluent Ext: Approximately 7 cm x 3 cm area of soft tissue mass with swelling and tenderness of the right proximal forearm. Small skin abrasion medial to this. Mildly fluctuant but more indurated than fluctuant. No erythema or drainage.  ASSESSMENT/PLAN:  Spastic hemiplegia affecting dominant side Increased  spasm while all out of tizanidine. Encouraged patient to get tizanidine from his pharmacy. I have been prescribing this until he can get in with his physical medicine and rehabilitation physician. Instructed him to schedule an appointment with them.   Syncope No further syncopal events. We'll continue to monitor.   Hyperglycemia Noted after visit that last lab draw had glucose in the 140s. Will plan to do A1c at next appointment.   Hyperlipidemia Sent a new prescription for atorvastatin. Will need CMET and CBC rechecked in 1-2 months.   Major depression, single episode Stable, doing well on Zoloft. Continue this medication.   Pain and swelling of right forearm Soft palpable mass present on the right forearm. Differential diagnosis includes lipoma, serous accumulation of fluid, blood collection, abscess. Doubt lipoma given quick onset. It is not red or tender enough to likely be an abscess. Suspect seroma, possibly related to increased contractures bilaterally tizanidine. Patient will be scheduled for an ultrasound. If it is determined to be fluid, will have ultrasound provider aspirate fluid and send for culture, cell count and differential, and Gram stain. Patient will follow-up with me in 2-3 weeks in office for this.    FOLLOW UP: F/u in 2-3 weeks for R arm swelling **Needs A1c checked at that visit**  [place] J. Ardelia Mems, [place]

## 2015-03-05 NOTE — Telephone Encounter (Signed)
Ordered correct u/s and they sent it in for review. [place] Kennon Holter, CMA

## 2015-03-05 NOTE — Patient Instructions (Addendum)
Sent in new prescription for cholesterol medicine Stay on same zoloft dose Get tizanidine from pharmacy and follow up with Dr. Letta Pate  We will call you with appointment to get ultrasound set up of your arm and try to drain it If it gets red, hot, or drains on its own please return ASAP to be seen or go to ER.  Follow up with me in 2-3 weeks for it  Be well, Dr. Ardelia Mems

## 2015-03-05 NOTE — Addendum Note (Signed)
Addended by: Junious Dresser on: 03/05/2015 02:50 PM   Modules accepted: Orders

## 2015-03-05 NOTE — Assessment & Plan Note (Signed)
Soft palpable mass present on the right forearm. Differential diagnosis includes lipoma, serous accumulation of fluid, blood collection, abscess. Doubt lipoma given quick onset. It is not red or tender enough to likely be an abscess. Suspect seroma, possibly related to increased contractures bilaterally tizanidine. Patient will be scheduled for an ultrasound. If it is determined to be fluid, will have ultrasound provider aspirate fluid and send for culture, cell count and differential, and Gram stain. Patient will follow-up with me in 2-3 weeks in office for this.

## 2015-03-05 NOTE — Progress Notes (Signed)
I was preceptor for this office visit.  

## 2015-03-05 NOTE — Assessment & Plan Note (Signed)
Sent a new prescription for atorvastatin. Will need CMET and CBC rechecked in 1-2 months.

## 2015-03-05 NOTE — Telephone Encounter (Signed)
Red team,  Pt needs to be scheduled for an ultrasound of the soft tissue mass/fluid collection on R arm. If it is determined to be fluid by ultrasound, needs to have this aspirated while in the ultrasound department and have fluid sent for the following studies: -cell count & differential -culture and gram stain  I have attempted twice to call ultrasound department and make sure I am ordering this correctly, but no one is answering. Can you please call u/s dept and schedule this appointment, and be sure I have entered the order correctly?  Thanks, Leeanne Rio, MD

## 2015-03-06 ENCOUNTER — Telehealth: Payer: Self-pay | Admitting: Family Medicine

## 2015-03-06 NOTE — Telephone Encounter (Signed)
Rx resent to pharmacy, patient informed.

## 2015-03-06 NOTE — Telephone Encounter (Signed)
Pt went to get his atorvasatin and the pharmacy says he already picked it up Pt wants dr to call to verify the RX was received by the pharmacy Please call pt when this is done

## 2015-03-12 ENCOUNTER — Ambulatory Visit (HOSPITAL_COMMUNITY): Payer: Medicare Other

## 2015-03-13 ENCOUNTER — Other Ambulatory Visit: Payer: Self-pay | Admitting: Radiology

## 2015-03-16 ENCOUNTER — Ambulatory Visit (HOSPITAL_COMMUNITY): Payer: Medicare Other

## 2015-03-16 ENCOUNTER — Ambulatory Visit (HOSPITAL_COMMUNITY): Admission: RE | Admit: 2015-03-16 | Payer: Medicare Other | Source: Ambulatory Visit

## 2015-03-20 ENCOUNTER — Other Ambulatory Visit: Payer: Self-pay | Admitting: Radiology

## 2015-03-23 ENCOUNTER — Ambulatory Visit (HOSPITAL_COMMUNITY): Admission: RE | Admit: 2015-03-23 | Payer: Medicare Other | Source: Ambulatory Visit

## 2015-03-27 ENCOUNTER — Ambulatory Visit: Payer: Medicare Other | Admitting: Family Medicine

## 2015-04-19 ENCOUNTER — Other Ambulatory Visit: Payer: Self-pay | Admitting: Family Medicine

## 2015-04-23 ENCOUNTER — Other Ambulatory Visit: Payer: Self-pay | Admitting: Family Medicine

## 2015-06-01 ENCOUNTER — Ambulatory Visit (HOSPITAL_BASED_OUTPATIENT_CLINIC_OR_DEPARTMENT_OTHER): Payer: Medicare Other | Admitting: Physical Medicine & Rehabilitation

## 2015-06-01 ENCOUNTER — Encounter: Payer: Medicare Other | Attending: Physical Medicine & Rehabilitation

## 2015-06-01 ENCOUNTER — Encounter: Payer: Self-pay | Admitting: Physical Medicine & Rehabilitation

## 2015-06-01 VITALS — BP 121/88 | HR 86

## 2015-06-01 DIAGNOSIS — G8110 Spastic hemiplegia affecting unspecified side: Secondary | ICD-10-CM

## 2015-06-01 DIAGNOSIS — I69351 Hemiplegia and hemiparesis following cerebral infarction affecting right dominant side: Secondary | ICD-10-CM | POA: Insufficient documentation

## 2015-06-01 DIAGNOSIS — M62838 Other muscle spasm: Secondary | ICD-10-CM | POA: Diagnosis present

## 2015-06-01 MED ORDER — TIZANIDINE HCL 4 MG PO TABS: ORAL_TABLET | ORAL | Status: DC

## 2015-06-01 NOTE — Patient Instructions (Signed)
Restart Zanaflex 8 mg 4 times per day Will ask your primary physician to check a liver panel next time Will inject Dysport which is a longer acting Botox at next visit

## 2015-06-01 NOTE — Progress Notes (Signed)
Subjective:    Patient ID: Steven Read., male    DOB: 1956/10/04, 59 y.o.   MRN: [id number]  HPI  Steven Frey reports that he needs refill on his tizanidine, Has not been taking this for several months. Has complained of increasing hand spasms and freezing up.  Last Botox injection in April 2015. Patient had transportation issues and was lost to follow-up for over one year. Had a right fifth digit proximal fracture in March 2016. This was treated with splinting, no surgery was needed. This problem has improved.  Patient had good relief with combination of Zanaflex and Botox injections 400 units to the right upper extremity at each visit.   Pain Inventory Average Pain 8 Pain Right Now 8 My pain is aching  In the last 24 hours, has pain interfered with the following? General activity 2 Relation with others 2 Enjoyment of life 4 What TIME of day is your pain at its worst? all Sleep (in general) Fair  Pain is worse with: some activites Pain improves with: medication Relief from Meds: 5  Mobility use a cane ability to climb steps?  yes do you drive?  no  Function disabled: date disabled . I need assistance with the following:  bathing, meal prep and household duties  Neuro/Psych bladder control problems bowel control problems weakness numbness tremor trouble walking spasms confusion  Prior Studies Any changes since last visit?  no  Physicians involved in your care Any changes since last visit?  no   Family History  Problem Relation Age of Onset  . Coronary artery disease Neg Hx   . Colon cancer Neg Hx   . Alcohol abuse Mother   . Alcohol abuse Father   . Alcohol abuse    . Early death Mother   . Diabetes Sister   . Hyperlipidemia Sister   . Hypertension Sister   . Prostate cancer Father 17  . Breast cancer Paternal Grandmother    Social History   Social History  . Marital Status: Divorced    Spouse Name: N/A  . Number of Children: N/A  .  Years of Education: N/A   Social History Main Topics  . Smoking status: Current Every Day Smoker -- 0.25 packs/day    Types: Cigarettes  . Smokeless tobacco: Never Used  . Alcohol Use: 0.0 oz/week    0 Standard drinks or equivalent per week     Comment: hx of alcohol abuse, every 2-3 weeks beer on and off   . Drug Use: No     Comment: h/o THC, cocaine, heroin; no longer using since 2011  . Sexual Activity: No   Other Topics Concern  . None   Social History Narrative   Divorced, 2 children. 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 Rayann Heman is his cardiologist, last seen 03/2012; sees 1x/year    Dr. Krista Blue, [facility], is neurologist for seizures       Single, unemployed    Lives with his 35yo father   Current smoker since 1966   Past Surgical History  Procedure Laterality Date  . Right wrist orif  1976    for fx  . Pancreatic stent placement    . Inguinal hernia repair  1960    right  . Inguinal hernia repair Left 07/12/2013    Procedure: HERNIA REPAIR INGUINAL ADULT;  Surgeon: Adin Hector, MD;  Location: [place];  Service: General;  Laterality: Left;  . Insertion of mesh Left 07/12/2013    Procedure: INSERTION OF MESH;  Surgeon: Adin Hector, MD;  Location: [place];  Service: General;  Laterality: Left;   Past Medical History  Diagnosis Date  . Angioedema     rx requiring intubation/vent support suspected secondary to ACE1 (but also on zithromax) 5/09 hospitalization   . Smoker   . ETOH abuse   . Chronic pancreatitis     (10/08 [place]). admx 02/2010 pseudocyst aspirated during admxn. pancreatic dual stent placement at [facility] 11/11  . 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   . Gastritis     alcohol induced  . Depression     no hx of meds  . VF (ventricular fibrillation) 2011    arrest 6/11. successfully rescucitated  w/prolinged hospitalization due to respitatory failure. QT prolongation during cooling, now resolved  . History of stroke 2011    reports it was caused by pain from pancreatitis- caused heart to stop and stroke   . 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 Anxiety   . Edema   . 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 Anoxic brain damage   . Anemia   . Hypertension 2011  . Arthritis   . Chronic pain 2011  . Hyperlipidemia 2011  . Inguinal hernia 2011  . COPD (chronic obstructive pulmonary disease)   . GERD (gastroesophageal reflux disease)   . Shortness of breath     exertion  . Pneumonia     hx of  . Stroke     right sided weakness; affected long term memory  . Diabetes mellitus without complication     Type 2, pt reports no longer takes meds & A1C was normal.  . Myocardial infarction   . Seizures 2011    had seizure 11-24-14/went to ED per pt.    BP 121/88 mmHg  Pulse 86  SpO2 95%  Opioid Risk Score:   Fall Risk Score:  `1  Depression screen PHQ 2/9  Depression screen [facility] 2/9 06/01/2015 03/05/2015 12/25/2014 10/20/2014 09/26/2014 09/19/2014  Decreased Interest 0 0 0 1 3 3   Down, Depressed, Hopeless 0 0 0 1 3 3   PHQ - 2 Score 0 0 0 2 6 6   Altered sleeping - - - - - 1  Tired, decreased energy - - - - - 0  Change in appetite - - - - - 0  Feeling bad or failure about yourself  - - - - - 0  Trouble concentrating - - - - - 1  Moving slowly or fidgety/restless - - - - - 1  Suicidal thoughts - - - - - 3  PHQ-9 Score - - - - - 12    Review of Systems  Constitutional: Positive for chills, diaphoresis, appetite change and unexpected weight change.  Respiratory: Positive for shortness of breath.   Gastrointestinal: Positive for nausea, vomiting and diarrhea.       Bowel Control Problems  Genitourinary:       Bladder control problems  Musculoskeletal: Positive for gait problem.       Spasms  Neurological: Positive for tremors, weakness and numbness.    Psychiatric/Behavioral: Positive for confusion.  All other systems reviewed and are negative.      Objective:   Physical Exam  Constitutional: He is oriented to person, place, and  time. He appears well-developed and well-nourished.  HENT:  Head: Normocephalic and atraumatic.  Eyes: Conjunctivae are normal. Pupils are equal, round, and reactive to light.  Neck: Normal range of motion.  Neurological: He is alert and oriented to person, place, and time. He has normal reflexes.  Psychiatric: He has a normal mood and affect.  Neurogenic stuttering  Nursing note and vitals reviewed.  Patient has episodic finger flexion of the MCPs PIPs and DIPs of the second through fourth digits. Right fifth digit is contracted in a hyperextended position at the MCP flexed at the PIP and DIP Right wrist is fused. He has bony and enlargement of the right wrist. Right elbow lacks 10 of extension he has increased tone at the biceps.       Assessment & Plan:  Normal on. Right spastic hemiplegia secondary to remote left CVA Resume Zanaflex 8 mg 4 times a day Will need to monitor LFTs given this is a high dose.  Message sent to PCP  Plan is for the following treatment  50  units right pronator teres 50 units right FCU 50 units right FCR 75 units right EDC 50 units right EIP 25 units right adductor pollicis 25 units right ADM 50 units right medial FDP 12.5 units interosseous x3 12.5 units opponens pollicis  Discussed treatment plan with patient and his daughter they are in agreement

## 2015-06-03 DIAGNOSIS — H52203 Unspecified astigmatism, bilateral: Secondary | ICD-10-CM | POA: Diagnosis not present

## 2015-06-03 DIAGNOSIS — I6350 Cerebral infarction due to unspecified occlusion or stenosis of unspecified cerebral artery: Secondary | ICD-10-CM | POA: Diagnosis not present

## 2015-06-03 DIAGNOSIS — H5213 Myopia, bilateral: Secondary | ICD-10-CM | POA: Diagnosis not present

## 2015-06-03 DIAGNOSIS — H524 Presbyopia: Secondary | ICD-10-CM | POA: Diagnosis not present

## 2015-06-03 DIAGNOSIS — H2513 Age-related nuclear cataract, bilateral: Secondary | ICD-10-CM | POA: Diagnosis not present

## 2015-06-22 ENCOUNTER — Other Ambulatory Visit: Payer: Self-pay | Admitting: Physical Medicine & Rehabilitation

## 2015-07-03 ENCOUNTER — Encounter: Payer: Medicare Other | Attending: Physical Medicine & Rehabilitation

## 2015-07-03 ENCOUNTER — Ambulatory Visit: Payer: Medicare Other | Admitting: Physical Medicine & Rehabilitation

## 2015-07-03 DIAGNOSIS — I69351 Hemiplegia and hemiparesis following cerebral infarction affecting right dominant side: Secondary | ICD-10-CM | POA: Insufficient documentation

## 2015-07-10 ENCOUNTER — Other Ambulatory Visit: Payer: Self-pay | Admitting: Family Medicine

## 2015-07-17 ENCOUNTER — Other Ambulatory Visit: Payer: Self-pay | Admitting: Family Medicine

## 2015-09-07 ENCOUNTER — Other Ambulatory Visit: Payer: Self-pay | Admitting: Family Medicine

## 2015-09-12 ENCOUNTER — Other Ambulatory Visit: Payer: Self-pay | Admitting: Family Medicine

## 2015-09-30 ENCOUNTER — Other Ambulatory Visit: Payer: Self-pay | Admitting: Family Medicine

## 2015-10-15 ENCOUNTER — Other Ambulatory Visit: Payer: Self-pay | Admitting: Family Medicine

## 2015-10-15 NOTE — Telephone Encounter (Signed)
appt scheduled for 1/23. Frey, Steven Spotted

## 2015-11-02 ENCOUNTER — Encounter: Payer: Self-pay | Admitting: Family Medicine

## 2015-11-02 ENCOUNTER — Emergency Department (HOSPITAL_COMMUNITY): Payer: Medicare Other

## 2015-11-02 ENCOUNTER — Ambulatory Visit (HOSPITAL_COMMUNITY): Payer: Medicare Other

## 2015-11-02 ENCOUNTER — Other Ambulatory Visit: Payer: Self-pay

## 2015-11-02 ENCOUNTER — Encounter (HOSPITAL_COMMUNITY): Payer: Self-pay | Admitting: *Deleted

## 2015-11-02 ENCOUNTER — Inpatient Hospital Stay (HOSPITAL_COMMUNITY)
Admission: EM | Admit: 2015-11-02 | Discharge: 2015-11-06 | DRG: 438 | Disposition: A | Discharge status: Home Health | Payer: Medicare Other | Attending: Family Medicine | Admitting: Family Medicine

## 2015-11-02 ENCOUNTER — Ambulatory Visit (HOSPITAL_COMMUNITY)
Admission: RE | Admit: 2015-11-02 | Discharge: 2015-11-02 | Disposition: A | Discharge status: Home / Self Care | Payer: Medicare Other | Source: Ambulatory Visit | Attending: Family Medicine | Admitting: Family Medicine

## 2015-11-02 ENCOUNTER — Ambulatory Visit (INDEPENDENT_AMBULATORY_CARE_PROVIDER_SITE_OTHER): Payer: Medicare Other | Admitting: Family Medicine

## 2015-11-02 VITALS — BP 102/70 | HR 86 | Temp 97.8°F | Ht 68.0 in | Wt 132.0 lb

## 2015-11-02 DIAGNOSIS — Z8674 Personal history of sudden cardiac arrest: Secondary | ICD-10-CM

## 2015-11-02 DIAGNOSIS — J449 Chronic obstructive pulmonary disease, unspecified: Secondary | ICD-10-CM | POA: Diagnosis present

## 2015-11-02 DIAGNOSIS — Z888 Allergy status to other drugs, medicaments and biological substances status: Secondary | ICD-10-CM

## 2015-11-02 DIAGNOSIS — I10 Essential (primary) hypertension: Secondary | ICD-10-CM | POA: Diagnosis present

## 2015-11-02 DIAGNOSIS — R188 Other ascites: Secondary | ICD-10-CM | POA: Insufficient documentation

## 2015-11-02 DIAGNOSIS — K7031 Alcoholic cirrhosis of liver with ascites: Secondary | ICD-10-CM | POA: Diagnosis present

## 2015-11-02 DIAGNOSIS — Z9181 History of falling: Secondary | ICD-10-CM

## 2015-11-02 DIAGNOSIS — I2510 Atherosclerotic heart disease of native coronary artery without angina pectoris: Secondary | ICD-10-CM | POA: Diagnosis present

## 2015-11-02 DIAGNOSIS — R531 Weakness: Secondary | ICD-10-CM | POA: Diagnosis present

## 2015-11-02 DIAGNOSIS — M20091 Other deformity of right finger(s): Secondary | ICD-10-CM | POA: Diagnosis present

## 2015-11-02 DIAGNOSIS — I69898 Other sequelae of other cerebrovascular disease: Secondary | ICD-10-CM

## 2015-11-02 DIAGNOSIS — E119 Type 2 diabetes mellitus without complications: Secondary | ICD-10-CM | POA: Diagnosis not present

## 2015-11-02 DIAGNOSIS — R9431 Abnormal electrocardiogram [ECG] [EKG]: Secondary | ICD-10-CM | POA: Insufficient documentation

## 2015-11-02 DIAGNOSIS — G8929 Other chronic pain: Secondary | ICD-10-CM | POA: Diagnosis not present

## 2015-11-02 DIAGNOSIS — R197 Diarrhea, unspecified: Secondary | ICD-10-CM | POA: Diagnosis present

## 2015-11-02 DIAGNOSIS — Z883 Allergy status to other anti-infective agents status: Secondary | ICD-10-CM

## 2015-11-02 DIAGNOSIS — R109 Unspecified abdominal pain: Secondary | ICD-10-CM | POA: Diagnosis not present

## 2015-11-02 DIAGNOSIS — K852 Alcohol induced acute pancreatitis without necrosis or infection: Principal | ICD-10-CM | POA: Diagnosis present

## 2015-11-02 DIAGNOSIS — K7460 Unspecified cirrhosis of liver: Secondary | ICD-10-CM | POA: Diagnosis not present

## 2015-11-02 DIAGNOSIS — F419 Anxiety disorder, unspecified: Secondary | ICD-10-CM | POA: Diagnosis present

## 2015-11-02 DIAGNOSIS — E875 Hyperkalemia: Secondary | ICD-10-CM | POA: Diagnosis not present

## 2015-11-02 DIAGNOSIS — Z8739 Personal history of other diseases of the musculoskeletal system and connective tissue: Secondary | ICD-10-CM | POA: Diagnosis not present

## 2015-11-02 DIAGNOSIS — K652 Spontaneous bacterial peritonitis: Secondary | ICD-10-CM | POA: Insufficient documentation

## 2015-11-02 DIAGNOSIS — F1010 Alcohol abuse, uncomplicated: Secondary | ICD-10-CM | POA: Diagnosis present

## 2015-11-02 DIAGNOSIS — J9811 Atelectasis: Secondary | ICD-10-CM | POA: Diagnosis present

## 2015-11-02 DIAGNOSIS — F17210 Nicotine dependence, cigarettes, uncomplicated: Secondary | ICD-10-CM | POA: Diagnosis present

## 2015-11-02 DIAGNOSIS — E43 Unspecified severe protein-calorie malnutrition: Secondary | ICD-10-CM | POA: Diagnosis present

## 2015-11-02 DIAGNOSIS — E440 Moderate protein-calorie malnutrition: Secondary | ICD-10-CM | POA: Diagnosis present

## 2015-11-02 DIAGNOSIS — J90 Pleural effusion, not elsewhere classified: Secondary | ICD-10-CM | POA: Diagnosis not present

## 2015-11-02 DIAGNOSIS — Z6820 Body mass index (BMI) 20.0-20.9, adult: Secondary | ICD-10-CM

## 2015-11-02 DIAGNOSIS — Z8701 Personal history of pneumonia (recurrent): Secondary | ICD-10-CM | POA: Diagnosis not present

## 2015-11-02 DIAGNOSIS — F329 Major depressive disorder, single episode, unspecified: Secondary | ICD-10-CM | POA: Diagnosis present

## 2015-11-02 DIAGNOSIS — G40909 Epilepsy, unspecified, not intractable, without status epilepticus: Secondary | ICD-10-CM | POA: Diagnosis present

## 2015-11-02 DIAGNOSIS — K859 Acute pancreatitis without necrosis or infection, unspecified: Secondary | ICD-10-CM | POA: Diagnosis not present

## 2015-11-02 DIAGNOSIS — K7030 Alcoholic cirrhosis of liver without ascites: Secondary | ICD-10-CM | POA: Diagnosis present

## 2015-11-02 DIAGNOSIS — I445 Left posterior fascicular block: Secondary | ICD-10-CM

## 2015-11-02 DIAGNOSIS — R1012 Left upper quadrant pain: Secondary | ICD-10-CM | POA: Diagnosis not present

## 2015-11-02 DIAGNOSIS — R569 Unspecified convulsions: Secondary | ICD-10-CM

## 2015-11-02 DIAGNOSIS — R1084 Generalized abdominal pain: Secondary | ICD-10-CM | POA: Diagnosis not present

## 2015-11-02 DIAGNOSIS — Z8673 Personal history of transient ischemic attack (TIA), and cerebral infarction without residual deficits: Secondary | ICD-10-CM | POA: Diagnosis not present

## 2015-11-02 DIAGNOSIS — Z8719 Personal history of other diseases of the digestive system: Secondary | ICD-10-CM | POA: Diagnosis not present

## 2015-11-02 DIAGNOSIS — K861 Other chronic pancreatitis: Secondary | ICD-10-CM | POA: Diagnosis present

## 2015-11-02 DIAGNOSIS — R1032 Left lower quadrant pain: Secondary | ICD-10-CM | POA: Diagnosis not present

## 2015-11-02 DIAGNOSIS — D649 Anemia, unspecified: Secondary | ICD-10-CM | POA: Diagnosis not present

## 2015-11-02 DIAGNOSIS — D638 Anemia in other chronic diseases classified elsewhere: Secondary | ICD-10-CM | POA: Diagnosis present

## 2015-11-02 DIAGNOSIS — Z79899 Other long term (current) drug therapy: Secondary | ICD-10-CM | POA: Diagnosis not present

## 2015-11-02 DIAGNOSIS — G8110 Spastic hemiplegia affecting unspecified side: Secondary | ICD-10-CM | POA: Diagnosis present

## 2015-11-02 DIAGNOSIS — I252 Old myocardial infarction: Secondary | ICD-10-CM | POA: Diagnosis not present

## 2015-11-02 DIAGNOSIS — F17200 Nicotine dependence, unspecified, uncomplicated: Secondary | ICD-10-CM | POA: Diagnosis present

## 2015-11-02 LAB — COMPREHENSIVE METABOLIC PANEL
ALT: 10 U/L — ABNORMAL LOW (ref 17–63)
AST: 18 U/L (ref 15–41)
Albumin: 2.1 g/dL — ABNORMAL LOW (ref 3.5–5.0)
Alkaline Phosphatase: 80 U/L (ref 38–126)
Anion gap: 12 (ref 5–15)
BUN: 12 mg/dL (ref 6–20)
CO2: 23 mmol/L (ref 22–32)
Calcium: 8.2 mg/dL — ABNORMAL LOW (ref 8.9–10.3)
Chloride: 103 mmol/L (ref 101–111)
Creatinine, Ser: 0.8 mg/dL (ref 0.61–1.24)
GFR calc Af Amer: >60 mL/min (ref >60)
GFR calc non Af Amer: >60 mL/min (ref >60)
Glucose, Bld: 100 mg/dL — ABNORMAL HIGH (ref 65–99)
Potassium: 3.6 mmol/L (ref 3.5–5.1)
Sodium: 138 mmol/L (ref 135–145)
Total Bilirubin: 0.5 mg/dL (ref 0.3–1.2)
Total Protein: 5.8 g/dL — ABNORMAL LOW (ref 6.5–8.1)

## 2015-11-02 LAB — CBC WITH DIFFERENTIAL/PLATELET
Basophils Absolute: 0 10*3/uL (ref 0.0–0.1)
Basophils Relative: 0 %
Eosinophils Absolute: 0.3 10*3/uL (ref 0.0–0.7)
Eosinophils Relative: 3 %
HCT: 29.1 % — ABNORMAL LOW (ref 39.0–52.0)
Hemoglobin: 8.9 g/dL — ABNORMAL LOW (ref 13.0–17.0)
Lymphocytes Relative: 19 %
Lymphs Abs: 2.6 10*3/uL (ref 0.7–4.0)
MCH: 28 pg (ref 26.0–34.0)
MCHC: 30.6 g/dL (ref 30.0–36.0)
MCV: 91.5 fL (ref 78.0–100.0)
Monocytes Absolute: 1.1 10*3/uL — ABNORMAL HIGH (ref 0.1–1.0)
Monocytes Relative: 8 %
Neutro Abs: 9.6 10*3/uL — ABNORMAL HIGH (ref 1.7–7.7)
Neutrophils Relative %: 70 %
Platelets: 524 10*3/uL — ABNORMAL HIGH (ref 150–400)
RBC: 3.18 MIL/uL — ABNORMAL LOW (ref 4.22–5.81)
RDW: 17 % — ABNORMAL HIGH (ref 11.5–15.5)
WBC: 13.7 10*3/uL — ABNORMAL HIGH (ref 4.0–10.5)

## 2015-11-02 LAB — PROTIME-INR
INR: 1.23 (ref 0.00–1.49)
Prothrombin Time: 15.6 seconds — ABNORMAL HIGH (ref 11.6–15.2)

## 2015-11-02 LAB — LIPASE, BLOOD: Lipase: 600 U/L — ABNORMAL HIGH (ref 11–51)

## 2015-11-02 MED ORDER — SODIUM CHLORIDE 0.9 % IV BOLUS (SEPSIS)
500.0000 mL | Freq: Once | INTRAVENOUS | Status: AC
  Administered 2015-11-02: 500 mL via INTRAVENOUS

## 2015-11-02 MED ORDER — SODIUM CHLORIDE 0.9 % IV SOLN
INTRAVENOUS | Status: DC
  Administered 2015-11-02: 20:00:00 via INTRAVENOUS

## 2015-11-02 MED ORDER — SODIUM CHLORIDE 0.9 % IV SOLN: INTRAVENOUS | Status: DC

## 2015-11-02 MED ORDER — FENTANYL CITRATE (PF) 100 MCG/2ML IJ SOLN
50.0000 ug | Freq: Once | INTRAMUSCULAR | Status: AC
  Administered 2015-11-02: 50 ug via INTRAVENOUS
  Filled 2015-11-02: qty 2

## 2015-11-02 MED ORDER — IOHEXOL 300 MG/ML  SOLN
100.0000 mL | Freq: Once | INTRAMUSCULAR | Status: AC | PRN
  Administered 2015-11-02: 100 mL via INTRAVENOUS

## 2015-11-02 MED ORDER — ONDANSETRON HCL 4 MG/2ML IJ SOLN
4.0000 mg | Freq: Once | INTRAMUSCULAR | Status: AC
  Administered 2015-11-02: 4 mg via INTRAVENOUS
  Filled 2015-11-02: qty 2

## 2015-11-02 NOTE — ED Notes (Signed)
Pt from [facility] by Rio.  Pt was seen at [facility] for routine check up and noted to them that he has suffered a 40lb weight loss in last month or 2.  Pt has significant hx of pancreatitis and alcoholic cirrhosis as well as cardiac arrest with anoxic brain injury in the past.

## 2015-11-02 NOTE — ED Notes (Signed)
Patient transported to CT 

## 2015-11-02 NOTE — Progress Notes (Signed)
Date of Visit: 11/02/2015   HPI:  Patient presents to clinic for routine follow up. He has a new chief complaint today. Has had abdominal pain for 1 month in the epigastric area and left upper quadrant. Not eating or drinking well. Has tried to keep some gatorade down. Last vomiting was several days ago and was able to keep liquids down since then. Has felt warm off and on, but does not have a thermometer so no known objective fevers. stooling normally. Urine stream is a little slow, otherwise normal urination. Patient reports he has not sought care during the last month while the pain has gone on. Pain is usually constant. This feels similar to prior episode of pancreatitis. He reports one prior episode of severe pancreatitis, during which he had a cardiac arrest.   Upon review of records, patient had cardiac arrest in July 2011 in setting of possible electrolyte abnormalities & prolonged QT, pancreatitis. Had multiple admissions in the past for recurrent acute on chronic pancreatitis dating back to at least 2008, up until late 2011.  Transferred to [facility] in late 2011 for pancreatic duct stenting. There was apparently also concern for liver abscess versus HCC - last workup for this appears to be in 2011, and records are limited from that time as they were on paper charts then.   ROS: See HPI.  [place]: prior strokes, recurrent pancreatitis, ETOH abuse, prior cardiac arrest  PHYSICAL EXAM: BP 102/70 mmHg  Pulse 86  Temp(Src) 97.8 F (36.6 C) (Oral)  Ht 5\' 8"  (1.727 m)  Wt 132 lb (59.875 kg)  BMI 20.08 kg/m2  SpO2 98% Gen: cachectic appearing male, in no acute distress but appears ill HEENT: normocephalic, atraumatic. Temporal wasting. Mouth moist Heart: mildly tachycardic, regular rhythm, no murmurs Lungs: clear to auscultation bilaterally, normal work of breathing  Abdomen: normoactive bowel sounds. Abdomen is quite tender diffusely, most significantly in epigastric area and LUQ.  Concern for palpable splenomegaly versus LUQ mass. No rebound tenderness.  Neuro: alert, chronic R sided contracture and deformity of hand noted Ext: No appreciable lower extremity edema bilaterally   EKG performed today and was unremarkable. No major ST changes. NSR.   ASSESSMENT/PLAN:  60 yo M with history of prior ETOH abuse, prior recurrent acute on chronic pancreatitis with stenting of pancreatic duct presenting with one month of abdominal pain, vomiting, and poor PO intake. Review of records shows that pancreatitis has has not really been an issue for him in the last 5+ years, so this is an acute change for him. He has lost 40 lb in the last year and has unfortunately not consistently followed up. With his complex history of pancreatic pathology, weight loss, and current abdominal exam I am very concerned for pancreatic malignancy or other significant acute process. I would have liked to admit patient directly to hospital for labwork and imaging, but there are no hospital beds available as the hospital is at high census. Decision was thus made to call [facility] for transfer to ED, to expedite workup. Patient is requesting admission and is agreeable to this plan. ED charge nurse made aware.  [place]. Ardelia Mems, [place]

## 2015-11-02 NOTE — ED Provider Notes (Signed)
CSN: [id number]     Arrival date & time 11/02/15  1753 History   First MD Initiated Contact with Patient 11/02/15 1820     Chief Complaint  Patient presents with  . Abdominal Pain     (Consider location/radiation/quality/duration/timing/severity/associated sxs/prior Treatment) Patient is a 60 y.o. male presenting with abdominal pain. The history is provided by the patient.  Abdominal Pain Associated symptoms: diarrhea, nausea, shortness of breath and vomiting   Associated symptoms: no chest pain, no dysuria and no fever    patient sent from family practice clinic. Patient with complaint of abdominal pain predominantly in left side for 1 month. Been getting worse of late. Associated with nausea and vomiting. Just nausea today but had vomiting yesterday. Also several days of diarrhea. Associated with shortness of breath. Patient has a history of alcoholic cirrhosis and a history of pancreatitis.  Past Medical History  Diagnosis Date  . Angioedema     rx requiring intubation/vent support suspected secondary to ACE1 (but also on zithromax) 5/09 hospitalization   . Smoker   . ETOH abuse   . Chronic pancreatitis ([place])     (10/08 hops). admx 02/2010 pseudocyst aspirated during admxn. pancreatic dual stent placement at [facility] 11/11  . Cirrhosis ([place]) 2011    due to ETOH with recent hepatic abcess and possible HCC.   . Gastritis     alcohol induced  . Depression     no hx of meds  . VF (ventricular fibrillation) ([place]) 2011    arrest 6/11. successfully rescucitated w/prolinged hospitalization due to respitatory failure. QT prolongation during cooling, now resolved  . History of stroke 2011    reports it was caused by pain from pancreatitis- caused heart to stop and stroke   . 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 Anxiety   . Edema   . 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 Anoxic brain damage ([facility])   . Anemia   . Hypertension 2011  . Arthritis   . Chronic  pain 2011  . Hyperlipidemia 2011  . Inguinal hernia 2011  . COPD (chronic obstructive pulmonary disease) ([place])   . GERD (gastroesophageal reflux disease)   . Shortness of breath     exertion  . Pneumonia     hx of  . Stroke [facility])     right sided weakness; affected long term memory  . Diabetes mellitus without complication ([place])     Type 2, pt reports no longer takes meds & A1C was normal.  . Myocardial infarction ([place])   . Seizures ([place]) 2011    had seizure 11-24-14/went to ED per pt.    Past Surgical History  Procedure Laterality Date  . Right wrist orif  1976    for fx  . Pancreatic stent placement    . Inguinal hernia repair  1960    right  . Inguinal hernia repair Left 07/12/2013    Procedure: HERNIA REPAIR INGUINAL ADULT;  Surgeon: Adin Hector, MD;  Location: [place];  Service: General;  Laterality: Left;  . Insertion of mesh Left 07/12/2013    Procedure: INSERTION OF MESH;  Surgeon: Adin Hector, MD;  Location: [place];  Service: General;  Laterality: Left;   Family History  Problem Relation Age of Onset  . Coronary artery disease Neg Hx   . Colon cancer Neg Hx   . Alcohol abuse Mother   . Alcohol abuse Father   . Alcohol abuse    . Early death  Mother   . Diabetes Sister   . Hyperlipidemia Sister   . Hypertension Sister   . Prostate cancer Father 18  . Breast cancer Paternal Grandmother    Social History  Substance Use Topics  . Smoking status: Current Every Day Smoker -- 0.25 packs/day    Types: Cigarettes  . Smokeless tobacco: Never Used  . Alcohol Use: 0.0 oz/week    0 Standard drinks or equivalent per week     Comment: hx of alcohol abuse, every 2-3 weeks beer on and off     Review of Systems  Constitutional: Negative for fever.  HENT: Negative for congestion.   Eyes: Negative for visual disturbance.  Respiratory: Positive for shortness of breath.   Cardiovascular: Negative for chest pain.  Gastrointestinal: Positive for nausea, vomiting,  abdominal pain and diarrhea.  Genitourinary: Negative for dysuria.  Musculoskeletal: Positive for back pain.  Skin: Negative for rash.  Hematological: Does not bruise/bleed easily.  Psychiatric/Behavioral: Negative for confusion.      Allergies  Azithromycin and Lisinopril  Home Medications   Prior to Admission medications   Medication Sig Start Date End Date Taking? Authorizing Provider  amLODipine (NORVASC) 5 MG tablet TAKE 1 TABLET (5 MG TOTAL) BY MOUTH DAILY. 09/30/15  Yes Leeanne Rio, MD  atorvastatin (LIPITOR) 40 MG tablet TAKE 1 TABLET EVERY DAY 10/15/15  Yes Leeanne Rio, MD  carvedilol (COREG) 3.125 MG tablet TAKE 1 TABLET BY MOUTH 2 TIMES DAILY WITH A MEAL. 12/25/14  Yes Leeanne Rio, MD  levETIRAcetam (KEPPRA) 1000 MG tablet TAKE 1 TABLET BY MOUTH 2 TIMES DAILY. 09/30/15  Yes Leeanne Rio, MD  Naproxen Sodium (ALEVE PO) Take 2 tablets by mouth as needed (pain).    Yes Historical Provider, MD  sertraline (ZOLOFT) 100 MG tablet TAKE 1 TABLET EVERY DAY 09/07/15  Yes Leeanne Rio, MD  [employer] B-12 1000 MCG TBCR TAKE 1 TABLET BY MOUTH DAILY    Leeanne Rio, MD  folic acid (FOLVITE) 1 MG tablet TAKE 1 TABLET BY MOUTH DAILY    Leeanne Rio, MD  polyethylene glycol powder (GLYCOLAX/MIRALAX) powder Take 17 g by mouth daily as needed. 10/20/14   Leeanne Rio, MD  thiamine (VITAMIN B-1) 100 MG tablet Take 1 tablet (100 mg total) by mouth daily. 12/25/12   Jacquelyn A McGill, MD  tiZANidine (ZANAFLEX) 4 MG tablet TAKE 2 TABLETS BY MOUTH EVERY 6 HOURS AS NEEDED...NEEDS VISIT FOR MORE REFILLS Patient not taking: Reported on 11/02/2015 06/01/15   Charlett Blake, MD   BP 107/70 mmHg  Pulse 87  Resp 19  SpO2 98% Physical Exam  Constitutional: He is oriented to person, place, and time. He appears well-developed and well-nourished. No distress.  HENT:  Head: Normocephalic and atraumatic.  Mucous membranes dry.  Neck: Normal range of  motion.  Cardiovascular: Normal rate and regular rhythm.   No murmur heard. Pulmonary/Chest: Effort normal and breath sounds normal. No respiratory distress.  Abdominal: Soft. Bowel sounds are normal. There is tenderness. There is no guarding.  Mild tenderness left side of abdomen. No distention. No guarding.  Musculoskeletal: Normal range of motion.  Neurological: He is alert and oriented to person, place, and time. No cranial nerve deficit. He exhibits normal muscle tone. Coordination normal.  Skin: Skin is warm.  Nursing note and vitals reviewed.   ED Course  Procedures (including critical care time) Labs Review Labs Reviewed  CBC WITH DIFFERENTIAL/PLATELET - Abnormal; Notable for the following:  WBC 13.7 (*)    RBC 3.18 (*)    Hemoglobin 8.9 (*)    HCT 29.1 (*)    RDW 17.0 (*)    Platelets 524 (*)    Neutro Abs 9.6 (*)    Monocytes Absolute 1.1 (*)    All other components within normal limits  COMPREHENSIVE METABOLIC PANEL - Abnormal; Notable for the following:    Glucose, Bld 100 (*)    Calcium 8.2 (*)    Total Protein 5.8 (*)    Albumin 2.1 (*)    ALT 10 (*)    All other components within normal limits  LIPASE, BLOOD - Abnormal; Notable for the following:    Lipase 600 (*)    All other components within normal limits  PROTIME-INR - Abnormal; Notable for the following:    Prothrombin Time 15.6 (*)    All other components within normal limits  URINALYSIS, ROUTINE W REFLEX MICROSCOPIC (NOT AT [facility])   Results for orders placed or performed during the hospital encounter of 11/02/15  CBC with Differential/Platelet  Result Value Ref Range   WBC 13.7 (H) 4.0 - 10.5 K/uL   RBC 3.18 (L) 4.22 - 5.81 MIL/uL   Hemoglobin 8.9 (L) 13.0 - 17.0 g/dL   HCT 29.1 (L) 39.0 - 52.0 %   MCV 91.5 78.0 - 100.0 fL   MCH 28.0 26.0 - 34.0 pg   MCHC 30.6 30.0 - 36.0 g/dL   RDW 17.0 (H) 11.5 - 15.5 %   Platelets 524 (H) 150 - 400 K/uL   Neutrophils Relative % 70 %   Neutro Abs 9.6 (H)  1.7 - 7.7 K/uL   Lymphocytes Relative 19 %   Lymphs Abs 2.6 0.7 - 4.0 K/uL   Monocytes Relative 8 %   Monocytes Absolute 1.1 (H) 0.1 - 1.0 K/uL   Eosinophils Relative 3 %   Eosinophils Absolute 0.3 0.0 - 0.7 K/uL   Basophils Relative 0 %   Basophils Absolute 0.0 0.0 - 0.1 K/uL  Comprehensive metabolic panel  Result Value Ref Range   Sodium 138 135 - 145 mmol/L   Potassium 3.6 3.5 - 5.1 mmol/L   Chloride 103 101 - 111 mmol/L   CO2 23 22 - 32 mmol/L   Glucose, Bld 100 (H) 65 - 99 mg/dL   BUN 12 6 - 20 mg/dL   Creatinine, Ser 0.80 0.61 - 1.24 mg/dL   Calcium 8.2 (L) 8.9 - 10.3 mg/dL   Total Protein 5.8 (L) 6.5 - 8.1 g/dL   Albumin 2.1 (L) 3.5 - 5.0 g/dL   AST 18 15 - 41 U/L   ALT 10 (L) 17 - 63 U/L   Alkaline Phosphatase 80 38 - 126 U/L   Total Bilirubin 0.5 0.3 - 1.2 mg/dL   GFR calc non Af Amer >60 >60 mL/min   GFR calc Af Amer >60 >60 mL/min   Anion gap 12 5 - 15  Lipase, blood  Result Value Ref Range   Lipase 600 (H) 11 - 51 U/L  Protime-INR  Result Value Ref Range   Prothrombin Time 15.6 (H) 11.6 - 15.2 seconds   INR 1.23 0.00 - 1.49     Imaging Review Dg Chest 2 View  11/02/2015  CLINICAL DATA:  Stomach pain for 1 month. Smoker. Initial encounter. EXAM: CHEST  2 VIEW COMPARISON:  PA and lateral chest 07/02/2013.  CT chest 10/27/2011. FINDINGS: Linear atelectasis is seen in the right lung base. The lungs are otherwise clear. Heart size  is normal. No pneumothorax. Tiny bilateral pleural effusions are seen. IMPRESSION: Very small bilateral pleural effusions.  Otherwise negative. Electronically Signed   By: Inge Rise M.D.   On: 11/02/2015 19:31   Ct Abdomen Pelvis W Contrast  11/02/2015  CLINICAL DATA:  40 pound weight loss in the last 2 months. Abdominal pain. History of pancreatitis and alcoholic cirrhosis. Previous cardiac arrest with anoxic brain injury. EXAM: CT ABDOMEN AND PELVIS WITH CONTRAST TECHNIQUE: Multidetector CT imaging of the abdomen and pelvis was  performed using the standard protocol following bolus administration of intravenous contrast. CONTRAST:  159mL OMNIPAQUE IOHEXOL 300 MG/ML  SOLN COMPARISON:  09/26/2011 FINDINGS: Atelectasis in the lung bases with small bilateral pleural effusions. Coronary artery calcifications. Small pericardial effusion. Changes of hepatic cirrhosis with enlargement of the lateral segment left and caudate lobes of the liver. Small cysts in the left lobe of the liver similar to previous study. Free fluid in the upper abdomen, extending along the pericolic gutters to the pelvis. This is likely due to ascites. There is also fluid around the pancreas which appear somewhat heterogeneous and likely indicates acute pancreatitis. Normal enhancement of pancreatic parenchyma without evidence of pancreatic necrosis. No pancreatic ductal dilatation. Calcifications in the head and body of the pancreas likely due to chronic pancreatitis. No loculated fluid collections. Small cystic lesion in the tail of the pancreas measuring 4 mm diameter. Based on size, this is likely benign. Gallbladder and bile ducts appear normal. Spleen, adrenal glands, kidneys, abdominal aorta, inferior vena cava, and retroperitoneal lymph nodes are unremarkable. Mild enlargement of a lymph node in the right cardiophrenic angle, measuring 10 mm diameter. This is nonspecific but may be reactive. This was present previously. Stomach, small bowel, and colon are mostly decompressed. No free air in the abdomen. Pelvis: Prostate gland is not enlarged. Bladder wall is not thickened. Free fluid in the pelvis as above. Diverticula in the colon without inflammatory change. Appendix is not identified. Postoperative changes consistent with inguinal hernia repairs. No destructive bone lesions. IMPRESSION: Peripancreatic fluid likely representing changes of acute pancreatitis. No evidence of pancreatic necrosis and no loculated fluid collections. Changes of hepatic cirrhosis with  diffuse abdominal and pelvic fluid, likely ascites. Small bilateral pleural effusions with atelectasis in the lung bases. Small pericardial effusion. Electronically Signed   By: Lucienne Capers M.D.   On: 11/02/2015 22:50   I have personally reviewed and evaluated these images and lab results as part of my medical decision-making.   EKG Interpretation None      MDM   Final diagnoses:  Acute pancreatitis, unspecified pancreatitis type    Patient referred from family practice clinic for evaluation of abdominal pain. Patient with a history of alcoholic cirrhosis and pancreatitis. Patient's had abdominal pain left lower quadrant left upper quadrant. This present for about a month but getting worse. Associated with vomiting and nausea nausea today but no vomiting. Diarrhea for the past several days. Associated with some shortness of breath. No fevers.  Labs show a markedly elevated lipase. Other liver function tests without significant abnormalities. CT scans consistent with pancreatitis. Patient will require admission for the pancreatitis. Renal function is normal.    Fredia Sorrow, MD 11/02/15 2310

## 2015-11-03 ENCOUNTER — Encounter (HOSPITAL_COMMUNITY): Payer: Self-pay | Admitting: General Practice

## 2015-11-03 DIAGNOSIS — K7031 Alcoholic cirrhosis of liver with ascites: Secondary | ICD-10-CM

## 2015-11-03 DIAGNOSIS — E440 Moderate protein-calorie malnutrition: Secondary | ICD-10-CM | POA: Diagnosis present

## 2015-11-03 DIAGNOSIS — F1010 Alcohol abuse, uncomplicated: Secondary | ICD-10-CM

## 2015-11-03 DIAGNOSIS — Z8673 Personal history of transient ischemic attack (TIA), and cerebral infarction without residual deficits: Secondary | ICD-10-CM

## 2015-11-03 DIAGNOSIS — E43 Unspecified severe protein-calorie malnutrition: Secondary | ICD-10-CM

## 2015-11-03 DIAGNOSIS — K859 Acute pancreatitis without necrosis or infection, unspecified: Secondary | ICD-10-CM | POA: Insufficient documentation

## 2015-11-03 LAB — IRON AND TIBC
Iron: 23 ug/dL — ABNORMAL LOW (ref 45–182)
Saturation Ratios: 19 % (ref 17.9–39.5)
TIBC: 120 ug/dL — ABNORMAL LOW (ref 250–450)
UIBC: 97 ug/dL

## 2015-11-03 LAB — CBC
HCT: 24 % — ABNORMAL LOW (ref 39.0–52.0)
Hemoglobin: 7.5 g/dL — ABNORMAL LOW (ref 13.0–17.0)
MCH: 28.5 pg (ref 26.0–34.0)
MCHC: 31.3 g/dL (ref 30.0–36.0)
MCV: 91.3 fL (ref 78.0–100.0)
Platelets: 439 10*3/uL — ABNORMAL HIGH (ref 150–400)
RBC: 2.63 MIL/uL — ABNORMAL LOW (ref 4.22–5.81)
RDW: 17.3 % — ABNORMAL HIGH (ref 11.5–15.5)
WBC: 9.6 10*3/uL (ref 4.0–10.5)

## 2015-11-03 LAB — COMPREHENSIVE METABOLIC PANEL
ALT: 9 U/L — ABNORMAL LOW (ref 17–63)
AST: 18 U/L (ref 15–41)
Albumin: 1.7 g/dL — ABNORMAL LOW (ref 3.5–5.0)
Alkaline Phosphatase: 69 U/L (ref 38–126)
Anion gap: 10 (ref 5–15)
BUN: 8 mg/dL (ref 6–20)
CO2: 23 mmol/L (ref 22–32)
Calcium: 7.6 mg/dL — ABNORMAL LOW (ref 8.9–10.3)
Chloride: 108 mmol/L (ref 101–111)
Creatinine, Ser: 0.64 mg/dL (ref 0.61–1.24)
GFR calc Af Amer: >60 mL/min (ref >60)
GFR calc non Af Amer: >60 mL/min (ref >60)
Glucose, Bld: 75 mg/dL (ref 65–99)
Potassium: 3.8 mmol/L (ref 3.5–5.1)
Sodium: 141 mmol/L (ref 135–145)
Total Bilirubin: 0.7 mg/dL (ref 0.3–1.2)
Total Protein: 4.5 g/dL — ABNORMAL LOW (ref 6.5–8.1)

## 2015-11-03 LAB — LIPID PANEL
Cholesterol: 40 mg/dL (ref 0–200)
HDL: <10 mg/dL — ABNORMAL LOW (ref >40)
Triglycerides: 48 mg/dL (ref <150)
VLDL: 10 mg/dL (ref 0–40)

## 2015-11-03 LAB — URINALYSIS, ROUTINE W REFLEX MICROSCOPIC
Glucose, UA: NEGATIVE mg/dL
Hgb urine dipstick: NEGATIVE
Ketones, ur: 15 mg/dL — AB
Leukocytes, UA: NEGATIVE
Nitrite: NEGATIVE
Protein, ur: 30 mg/dL — AB
Specific Gravity, Urine: 1.02 (ref 1.005–1.030)
pH: 6 (ref 5.0–8.0)

## 2015-11-03 LAB — MRSA PCR SCREENING: MRSA by PCR: NEGATIVE

## 2015-11-03 LAB — RETICULOCYTES
RBC.: 2.74 MIL/uL — ABNORMAL LOW (ref 4.22–5.81)
Retic Count, Absolute: 137 10*3/uL (ref 19.0–186.0)
Retic Ct Pct: 5 % — ABNORMAL HIGH (ref 0.4–3.1)

## 2015-11-03 LAB — URINE MICROSCOPIC-ADD ON
RBC / HPF: NONE SEEN RBC/hpf (ref 0–5)
WBC, UA: NONE SEEN WBC/hpf (ref 0–5)

## 2015-11-03 LAB — FOLATE: Folate: 6.3 ng/mL (ref >5.9)

## 2015-11-03 LAB — FERRITIN: Ferritin: 546 ng/mL — ABNORMAL HIGH (ref 24–336)

## 2015-11-03 LAB — VITAMIN B12: Vitamin B-12: 609 pg/mL (ref 180–914)

## 2015-11-03 MED ORDER — SODIUM CHLORIDE 0.9 % IJ SOLN
3.0000 mL | Freq: Two times a day (BID) | INTRAMUSCULAR | Status: DC
  Administered 2015-11-03 – 2015-11-05 (×4): 3 mL via INTRAVENOUS

## 2015-11-03 MED ORDER — THIAMINE HCL 100 MG/ML IJ SOLN
100.0000 mg | Freq: Every day | INTRAMUSCULAR | Status: DC
  Administered 2015-11-03 – 2015-11-04 (×2): 100 mg via INTRAVENOUS
  Filled 2015-11-03 (×2): qty 2

## 2015-11-03 MED ORDER — FOLIC ACID 1 MG PO TABS
1.0000 mg | ORAL_TABLET | Freq: Every day | ORAL | Status: DC
  Administered 2015-11-04 – 2015-11-06 (×3): 1 mg via ORAL
  Filled 2015-11-03 (×3): qty 1

## 2015-11-03 MED ORDER — SODIUM CHLORIDE 0.9 % IV SOLN
INTRAVENOUS | Status: DC
  Administered 2015-11-03 (×2): via INTRAVENOUS

## 2015-11-03 MED ORDER — DEXTROSE-NACL 5-0.9 % IV SOLN
INTRAVENOUS | Status: AC
  Administered 2015-11-04 – 2015-11-05 (×4): via INTRAVENOUS
  Filled 2015-11-03 (×3): qty 1000

## 2015-11-03 MED ORDER — ADULT MULTIVITAMIN W/MINERALS CH
1.0000 | ORAL_TABLET | Freq: Every day | ORAL | Status: DC
  Administered 2015-11-04 – 2015-11-06 (×3): 1 via ORAL
  Filled 2015-11-03 (×3): qty 1

## 2015-11-03 MED ORDER — LORAZEPAM 1 MG PO TABS
1.0000 mg | ORAL_TABLET | Freq: Four times a day (QID) | ORAL | Status: AC | PRN
  Administered 2015-11-04 – 2015-11-05 (×3): 1 mg via ORAL
  Filled 2015-11-03 (×3): qty 1

## 2015-11-03 MED ORDER — PROMETHAZINE HCL 25 MG/ML IJ SOLN: 25.0000 mg | Freq: Three times a day (TID) | INTRAMUSCULAR | Status: DC | PRN

## 2015-11-03 MED ORDER — SODIUM CHLORIDE 0.9 % IV SOLN
1000.0000 mg | Freq: Two times a day (BID) | INTRAVENOUS | Status: DC
  Administered 2015-11-03 – 2015-11-04 (×5): 1000 mg via INTRAVENOUS
  Filled 2015-11-03 (×6): qty 10

## 2015-11-03 MED ORDER — VITAMIN B-1 100 MG PO TABS
100.0000 mg | ORAL_TABLET | Freq: Every day | ORAL | Status: DC
  Administered 2015-11-05 – 2015-11-06 (×2): 100 mg via ORAL
  Filled 2015-11-03 (×2): qty 1

## 2015-11-03 MED ORDER — FENTANYL CITRATE (PF) 100 MCG/2ML IJ SOLN
50.0000 ug | INTRAMUSCULAR | Status: DC | PRN
  Administered 2015-11-03 – 2015-11-04 (×8): 50 ug via INTRAVENOUS
  Filled 2015-11-03 (×7): qty 2

## 2015-11-03 MED ORDER — INFLUENZA VAC SPLIT QUAD 0.5 ML IM SUSY: 0.5000 mL | PREFILLED_SYRINGE | INTRAMUSCULAR | Status: DC

## 2015-11-03 MED ORDER — ENOXAPARIN SODIUM 30 MG/0.3ML ~~LOC~~ SOLN
30.0000 mg | SUBCUTANEOUS | Status: DC
  Administered 2015-11-03: 30 mg via SUBCUTANEOUS
  Filled 2015-11-03: qty 0.3

## 2015-11-03 MED ORDER — LORAZEPAM 2 MG/ML IJ SOLN: 1.0000 mg | Freq: Four times a day (QID) | INTRAMUSCULAR | Status: AC | PRN

## 2015-11-03 MED ORDER — FENTANYL CITRATE (PF) 100 MCG/2ML IJ SOLN
50.0000 ug | Freq: Once | INTRAMUSCULAR | Status: AC
  Administered 2015-11-03: 50 ug via INTRAVENOUS
  Filled 2015-11-03: qty 2

## 2015-11-03 MED ORDER — SODIUM CHLORIDE 0.9 % IV SOLN
INTRAVENOUS | Status: AC
  Administered 2015-11-03: 03:00:00 via INTRAVENOUS
  Filled 2015-11-03 (×2): qty 1000

## 2015-11-03 MED ORDER — FENTANYL CITRATE (PF) 100 MCG/2ML IJ SOLN
50.0000 ug | INTRAMUSCULAR | Status: DC | PRN
  Administered 2015-11-03: 50 ug via INTRAVENOUS
  Filled 2015-11-03 (×2): qty 2

## 2015-11-03 NOTE — H&P (Signed)
[facility] Admission History and Physical Service Pager: [phone]  Patient name: Steven Frey. Medical record number: [id number] Date of birth: 1956-06-05 Age: 60 y.o. Gender: male  Primary Care Provider: Chrisandra Netters, MD Consultants: none Code Status: FULL (confirmed on admission)  Chief Complaint: abdominal pain  Assessment and Plan: Zaccheus Edmister. is a 60 y.o. male presenting with acute on chronic pancreatitis . PMH is significant for chronic pancreatitis, alcohol use d/o,  cirrhosis, hypertension, CVA, CAD, seizure d/o, anemia and depression.   Acute on chronic pancreatitis, likely secondary to alcohol abuse: presents with gradually worsening abdominal pain and NVD for the last one month. Denies hemoptysis or blood in stool. Denies greasy stool. He has weight loss of 40 lbs in the last one year. Exam significant for wasting and global abdominal tenderness to light touch, more on the left. CMP within normal except for albumin of 2.1. Lipase elevated to 600. CT abdomen with fluid around pancrease, no necrosis (did show some chronic calcifications or chronic pancreatitis changes). Also ascitic fluid and mild pleural effusion. Patient with pancreatic duct stenting in 2011 at [facility]. Pancreatitis likley secondary to EtOH use. He drinks about 3 beers and 3 shots of gin daily. Last drink about three days ago. Unlikely to be gall stone (none visualized on CT) with normal alk phosphate. He is on statin which could potentially cause pancreatitis as well.  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 No family history of this. No trauma. Can't rule out malignancy in the setting of 40 lbs weight loss although this could be secondary to pancreatitis as well. No signs and symptoms of infection although he endorses flu like symptoms about a month ago. WBC 13.7 likely stress induced. Currently on admit hemodynamically stable  and pain controlled with IV Fentanyl.  -admit to med-surg with tele. Attending Dr. Ardelia Mems -Unlikely to be severe by [place] (with or without LDH) and BISAP scores. -NPO with ice chips. May consider advancing diet in am  -1.5 mIVF overnight. Status 500 ml bolus and 75 ml/hr in ED -Hold of antibiotics given low suspicion for infection now -Hold home statin -Lipid panel, less likely but can check for HyperTG (last normal 12/2014) -IV fentanyl 50 mcg q2h as needed severe pain - Consider further malignancy work-up with Chest CT, despite negative CXR, CT Abd/Pelvis, also last colonoscopy 11/2014 unremarkable other than 1 tubular adenoma.  Seizure disorder: stable. He is on Keppra 1000 mg twice a day at home.  -IV Keppra 1000 mg twice a day starting now  H/o CVA, MI in 2011: reports that this is secondary his heart attack and pancreatitis in 2011. No history of CAD in his chart. Last stress test in 2014 and last Echo in 2016 within normal. 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 Child-Pugh Classification Grade A (100% > 85% survival 1-2 years), MELD Score 9 (3 mo mortality 1.9%, LOW). Mention of some asitic fluid on abdominal CT. No transaminitis or elevated alk phos this admission. Chronic history of alcohol abuse without reported history of complicated alcohol withdrawals. Last drink 3 days ago. -CIWA for EtOH use  Anemia, chronic normocytic: Hgb 9. (13 about a year ago). Normocytic suggestive for anemia of chronic disease, previous iron panels in 2011 showed iron deficiency and elevated  ferritin. Last colonoscopy a year ago with tubular non villous polyp.  -anemia panel -am cbc  Hypertension: normotensive here. On amlodipine 5 mg anc coreg 3.125 mg twice a day  -hold home meds for now, given poor PO and IV  analgesia without significant HTN on admit.  Protein-Calorie malnutrition, Severe About 40 lbs weight loss in one year. Likely from pancreatitis, chronic alcohol abuse, poor appetite. Can't rule out malignancy -Nutrition consult when he starts by mouth   Depression: stable. On zoloft at home -hold while NPO (consider resuming sips with meds tomorrow if improved but still NPO)  FEN/GI: -1.28mVF (overnight, then reduce IVF) - NPO with ice chips  Prophylaxis: Lovenox SQ. Plt in 500's  Disposition: med-surg with tele pending improvement, treating acute pancreatitis with IVF resuscitation, NPO and adv diet, IV analgesia  History of Present Illness:  Steven Frey is a 60y.o. male presenting with abdominal pain, nausea, vomiting.  He is present in ED with both of his sisters. [facility]. He was anticipating a routine follow-up, but he had several worsening abdominal pain complaints and was seen more urgently, he was sent to ED for further evaluation.  Currently on admission in the ED, he reports main complaint of abdominal pain intermittently for past 1 month, recently worsening over past 1-2 weeks with more constant pain. Describes pain as located mid abdomen or epigastric region and Left upper quadrant, mostly sharp pains, severity from 6 to 8 out of 10 (the fentanyl in ED significantly helped pain and lasted 2-3 hours), sometimes eating makes abdominal pain worse. Associated with nausea  and vomiting 2-3x daily for past 3 weeks (vomit is NBNB), last vomit was 3 days ago, states he has not been eating much over past month with poor appetite (eating makes nausea worse) but attributes some of this to "the flu". Has not attempted any PO today, last meal was Sunday 11/01/15 as he was "feeling a little better". Additionally has had some diarrhea (denies dark stools, blood, greasy stools). Tried Aleve twice daily (max 3-4 tabs daily) without  significant relief. - Last flare of similar pancreatitis in 2011. History of Alcoholic Cirrhosis. No history of gallstones. - Denies any family history of pancreatic problems including cancer - Not followed regularly by GI. Had evaluation in 2012 by [facility]. Also has seen [facility] Dr PHenrene Pastorfor last colonoscopy 2016, x 1 tubular adenoma (benign tissue) - Admits to weight loss of 40 lbs over 1 year  Additionally complains of dyspnea on exertion, started about 4 days ago, on walking about 50 feet, states that this is a new worsening problem within past week, previously was able to walk >200 ft without shortness of breath. Denies any active CP in ED. Admits chronic +2 pillows under head to sleep, does not try to lay flat but denies orthopnea. Denies edema.  In 2011 reports an episode of CVA and MI secondary complication of CVA with seizures has been controlled on keppra, last seizure (end of 2015).  Review Of Systems: Per HPI with the following additions: none Otherwise the remainder of the systems were negative.  Patient Active Problem List   Diagnosis Date Noted  . Protein-calorie malnutrition, severe ([place] 11/03/2015  . Pancreatitis 11/02/2015  . Hyperglycemia 03/05/2015  . Pain and swelling of right forearm 03/05/2015  . Diastolic dysfunction 034/74/2595 . Syncope 12/26/2014  . Constipation 10/26/2014  . Major depression, single episode 09/26/2014  . Preoperative cardiovascular  examination 03/28/2013  . Spastic hemiplegia affecting dominant side ([place]) 02/25/2013  . Left inguinal hernia 10/30/2012  . Seizures ([place]) 09/03/2012  . History of CVA (cerebrovascular accident) 01/30/2012  . Sinus tachycardia ([place]) 10/26/2011  . Pre-operative cardiovascular examination 10/26/2011  . 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 ARM PAIN, RIGHT 11/02/2010  . ANXIETY 10/08/2010  . URI 08/24/2010  . LEG EDEMA 08/24/2010  . ABSCESS OF LIVER 08/13/2010  . Anoxic brain damage ([place]) 07/07/2010  . CARDIAC  ARREST 05/24/2010  . VITAMIN B12 DEFICIENCY 03/25/2010  . Hyperlipidemia 03/15/2010  . ANEMIA 03/15/2010  . CIRRHOSIS, ALCOHOLIC 02/54/2706  . Chronic pancreatitis ([place]) 02/23/2010  . ERECTILE DYSFUNCTION 01/26/2009  . Alcohol abuse 01/26/2009  . VISUAL IMPAIRMENT 01/26/2009  . CIGARETTE SMOKER 03/10/2008  . HYPERTENSION, BENIGN 03/10/2008    Past Medical History: Past Medical History  Diagnosis Date  . Angioedema     rx requiring intubation/vent support suspected secondary to ACE1 (but also on zithromax) 5/09 hospitalization   . Smoker   . ETOH abuse   . Chronic pancreatitis ([place])     (10/08 hops). admx 02/2010 pseudocyst aspirated during admxn. pancreatic dual stent placement at [facility] 11/11  . Cirrhosis ([place]) 2011    due to ETOH with recent hepatic abcess and possible HCC.   . Gastritis     alcohol induced  . Depression     no hx of meds  . VF (ventricular fibrillation) ([place]) 2011    arrest 6/11. successfully rescucitated w/prolinged hospitalization due to respitatory failure. QT prolongation during cooling, now resolved  . History of stroke 2011    reports it was caused by pain from pancreatitis- caused heart to stop and stroke   . 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 Anxiety   . Edema   . 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 Anoxic brain damage ([facility])   . Anemia   . Hypertension 2011  . Arthritis   . Chronic pain 2011  . Hyperlipidemia 2011  . Inguinal hernia 2011  . COPD (chronic obstructive pulmonary disease) ([place])   . GERD (gastroesophageal reflux disease)   . Shortness of breath     exertion  . Pneumonia     hx of  . Stroke [facility])     right sided weakness; affected long term memory  . Diabetes mellitus without complication ([place])     Type 2, pt reports no longer takes meds & A1C was normal.  . Myocardial infarction ([place])   . Seizures ([place]) 2011    had seizure 11-24-14/went to ED per pt.     Past Surgical History: Past  Surgical History  Procedure Laterality Date  . Right wrist orif  1976    for fx  . Pancreatic stent placement    . Inguinal hernia repair  1960    right  . Inguinal hernia repair Left 07/12/2013    Procedure: HERNIA REPAIR INGUINAL ADULT;  Surgeon: Adin Hector, MD;  Location: [facility];  Service: General;  Laterality: Left;  . Insertion of mesh Left 07/12/2013    Procedure: INSERTION OF MESH;  Surgeon: Adin Hector, MD;  Location: [facility];  Service: General;  Laterality: Left;    Social History: Social History  Substance Use Topics  . Smoking status: Current Every Day Smoker -- 0.25 packs/day    Types: Cigarettes  . Smokeless tobacco: Never Used  . Alcohol Use: 0.0 oz/week    0 Standard drinks or equivalent per week  Comment: hx of alcohol abuse, every 2-3 weeks beer on and off    Additional social history:   Currently lives in [place] with his Father. Active smoker half pack per week. Drinks alcohol 3-4, 12oz beers daily and some liquor with 3 shots gin daily. Last drink approximately 3 days ago. Denies any complicated alcohol withdrawal history requiring treatment but does admit to some mild tremors, longest he has gone without alcohol about 1 month within past 1-2 years. Denies any other illicit drug use.   Please also refer to relevant sections of EMR.  Family History: Family History  Problem Relation Age of Onset  . Coronary artery disease Neg Hx   . Colon cancer Neg Hx   . Alcohol abuse Mother   . Alcohol abuse Father   . Alcohol abuse    . Early death Mother   . Diabetes Sister   . Hyperlipidemia Sister   . Hypertension Sister   . Prostate cancer Father 96  . Breast cancer Paternal Grandmother     Allergies and Medications: Allergies  Allergen Reactions  . Azithromycin Swelling    Throat swelling, body swelling  . Lisinopril Swelling and Other (See Comments)    REACTION: facial/neck edema   No current facility-administered medications on file prior  to encounter.   Current Outpatient Prescriptions on File Prior to Encounter  Medication Sig Dispense Refill  . amLODipine (NORVASC) 5 MG tablet TAKE 1 TABLET (5 MG TOTAL) BY MOUTH DAILY. 90 tablet 0  . atorvastatin (LIPITOR) 40 MG tablet TAKE 1 TABLET EVERY DAY 90 tablet 0  . carvedilol (COREG) 3.125 MG tablet TAKE 1 TABLET BY MOUTH 2 TIMES DAILY WITH A MEAL. 180 tablet 3  . levETIRAcetam (KEPPRA) 1000 MG tablet TAKE 1 TABLET BY MOUTH 2 TIMES DAILY. 60 tablet 0  . Naproxen Sodium (ALEVE PO) Take 2 tablets by mouth as needed (pain).     Marland Kitchen sertraline (ZOLOFT) 100 MG tablet TAKE 1 TABLET EVERY DAY 30 tablet 3  . [employer] B-12 1000 MCG TBCR TAKE 1 TABLET BY MOUTH DAILY 30 tablet 2  . folic acid (FOLVITE) 1 MG tablet TAKE 1 TABLET BY MOUTH DAILY 90 tablet 3  . polyethylene glycol powder (GLYCOLAX/MIRALAX) powder Take 17 g by mouth daily as needed. 850 g 1  . thiamine (VITAMIN B-1) 100 MG tablet Take 1 tablet (100 mg total) by mouth daily. 90 tablet 3  . tiZANidine (ZANAFLEX) 4 MG tablet TAKE 2 TABLETS BY MOUTH EVERY 6 HOURS AS NEEDED...NEEDS VISIT FOR MORE REFILLS (Patient not taking: Reported on 11/02/2015) 240 tablet 0    Objective: BP 99/65 mmHg  Pulse 85  Temp(Src) 98.1 F (36.7 C) (Oral)  Resp 17  SpO2 100% Exam: Gen: appears malnourished, able to sit in bed for exam Oropharynx: clear, moist Neck: supple, no LAD CV: regular rate and rythm. S1 & S2 audible, no murmurs. Resp: no apparent increased work of breathing, some crackles over his lower lung fields, good air movement GI: bowel sounds normal, tenderness to mild palpation globally, guarding. Not able to assess organomegaly or fluid due to tenderness. GU: no suprapubic tenderness Skin: no lesion MSK: severe muscular mass loss in his extremities, contracture and twitching in his right arm with fingers in variable flexion contractures Neuro: awake, alert, oriented  Labs and Imaging: CBC BMET   Recent Labs Lab 11/02/15 1948  WBC  13.7*  HGB 8.9*  HCT 29.1*  PLT 524*    Recent Labs Lab 11/02/15 1948  NA  138  K 3.6  CL 103  CO2 23  BUN 12  CREATININE 0.80  GLUCOSE 100*  CALCIUM 8.2*     UA - neg nitrite, neg leuks, no WBC  ED EKG 1/23 NSR HR 98, tall R waves concern for LVH, no acute ST-T wave ischemic changes. No significant change compared to 12/2014.  1/23 Chest X-ray 2v IMPRESSION:  Very small bilateral pleural effusions. Otherwise negative.  1/23 - CT Abd Pelvis w contrast IMPRESSION: Peripancreatic fluid likely representing changes of acute pancreatitis. No evidence of pancreatic necrosis and no loculated fluid collections. Changes of hepatic cirrhosis with diffuse abdominal and pelvic fluid, likely ascites. Small bilateral pleural effusions with atelectasis in the lung bases. Small pericardial effusion. - Additionally radiology read includes in findings "Calcifications in the head and body of the pancreas likely due to chronic pancreatitis"  Mercy Riding, MD 11/03/2015, 1:59 AM PGY-1, [place] Intern pager: [phone], text pages welcome  Upper Level Addendum:  I have seen and evaluated this patient along with Dr. Cyndia Skeeters and reviewed the above note, making necessary revisions in purple.  Nobie Putnam, [place], PGY-3

## 2015-11-03 NOTE — Care Management (Signed)
Utilization review completed. Ariela Mochizuki, RN Case Manager [phone]. 

## 2015-11-03 NOTE — Progress Notes (Addendum)
Initial Nutrition Assessment  DOCUMENTATION CODES:   Severe malnutrition in context of chronic illness  INTERVENTION:  RD to provide nutritional supplements once diet advances.   NUTRITION DIAGNOSIS:   Malnutrition related to chronic illness as evidenced by percent weight loss, energy intake < or equal to 75% for > or equal to 1 month, severe depletion of body fat.  GOAL:   Patient will meet greater than or equal to 90% of their needs  MONITOR:   Diet advancement, Weight trends, Labs, I & O's  REASON FOR ASSESSMENT:   Consult Poor PO  ASSESSMENT:   60 y.o. male presenting with acute on chronic pancreatitis . PMH is significant for chronic pancreatitis, alcohol use d/o, cirrhosis, hypertension, CVA, CAD, seizure d/o, anemia and depression.  Pt reports abdominal pains have improved. Pt reports having poor po intake over the past 1 month. He reports he has only been able to consume bites of foods at meals with consumption of Ensure only once daily. Pt with weight loss. Per Epic weight records, pt with a 21.8% weight loss in 1 year. Pt is agreeable to nutritional supplements once diet advances. RD to order once pt is able to eat.   Nutrition-Focused physical exam completed. Findings are severe fat depletion, moderate muscle depletion, and no edema.   Labs and medications reviewed.   Diet Order:  Diet NPO time specified Except for: Sips with Meds  Skin:  Reviewed, no issues  Last BM:  1/23  Height:   Ht Readings from Last 1 Encounters:  11/02/15 5\' 8"  (1.727 m)    Weight:   Wt Readings from Last 1 Encounters:  11/02/15 132 lb (59.875 kg)    Ideal Body Weight:  70 kg  BMI: Body Mass Index: 20.02 kg/(m^2)  Estimated Nutritional Needs:   Kcal:  1900-2100  Protein:  85-100 grams  Fluid:  1.9 - 2.1 L/day  EDUCATION NEEDS:   No education needs identified at this time  Corrin Parker, MS, RD, LDN Pager # [phone] After hours/ weekend pager # [phone]

## 2015-11-03 NOTE — Progress Notes (Signed)
Family Medicine Teaching Service Daily Progress Note Intern Pager: [phone]  Patient name: Steven Frey. Medical record number: [id number] Date of birth: 02/21/1956 Age: 60 y.o. Gender: male  Primary Care Provider: Chrisandra Netters, MD Consultants: none Code Status: full  Pt Overview and Major Events to Date:  1/23-admitted with acute pancreatitis  Assessment and Plan: Steven Kassel. is a 60 y.o. male presenting with acute on chronic pancreatitis . PMH is significant for chronic pancreatitis, alcohol use d/o, cirrhosis, hypertension, CVA, CAD, seizure d/o, anemia and depression.   Acute on chronic pancreatitis, likely secondary to alcohol abuse: his other risks are divisim and statin. Lipase elevated to 600. CT abdomen with fluid around pancrease and chronic calcifications but no necrosis. Also ascitic fluid and mild pleural effusion. Patient with pancreatic duct stenting in 2011 at [facility]. WBC 13.7 likely stress induced.  -Unlikely to be severe by [place] (with or without LDH) and BISAP scores. -NPO with ice chips. May consider advancing diet today -mIVF.  Status 500 ml bolus and 75 ml/hr in ED. 1.45mVF over night -Hold of antibiotics given low suspicion for infection now -Hold home statin -Lipid panel low cholesterol and TG suggestive for poor absorption. Consider creon but not sure if he has Part D. [facility] fentanyl 50 mcg q2h as needed severe pain  Seizure disorder: stable. He is on Keppra 1000 mg twice a day at home.  -IV Keppra 1000 mg twice a day starting now  H/o CVA, MI in 2011: reports that this is secondary his heart attack and pancreatitis in 2011. No history of CAD in his chart. Last stress test in 2014 and last Echo in 2016 within normal. 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 Child-Pugh Classification Grade A (100% > 85% survival 1-2 years), MELD Score 9 (3 mo mortality 1.9%, LOW). Mention of some asitic fluid on abdominal CT. No transaminitis or elevated alk phos this admission. Last drink 3 days ago. -Diagnostic and therapeutic para -F/u fluid chemistry, cytology and culture -CIWA for EtOH use  Anemia, chronic normocytic: Hgb 9. (13 about a year ago). Normocytic suggestive for anemia of chronic disease, previous iron panels in 2011 showed iron deficiency and elevated ferritin. Last colonoscopy a year ago with tubular non villous polyp.  -anemia panel -f/u cbc  Hypertension: normotensive here. On amlodipine 5 mg anc coreg 3.125 mg twice a day  -hold home meds for now, given poor PO and IV analgesia without significant HTN on admit.  Protein-Calorie malnutrition, Severe About 40 lbs weight loss in one year. Likely from pancreatitis, chronic alcohol abuse, poor appetite. Can't rule out malignancy -Nutrition consult when he starts by mouth   Depression: stable. On zoloft at home -hold while NPO (consider resuming sips with meds)  FEN/GI: -mIVF (overnight, then reduce IVF) - advance diet as tolerated  Prophylaxis: Lovenox SQ. Plt in 500's  Disposition: med-surg with tele pending improvement, treating acute pancreatitis with IVF resuscitation, NPO and adv diet, IV analgesia  Subjective:  Continues to report 5/10 abdominal pain. No nausea, vomiting or diarrhea since admission. Fentanyl helping with pain.  Objective: Temp:  [97.8 F (36.6 C)-98.1 F (36.7 C)] 97.8 F (36.6 C) (01/24 0456) Pulse Rate:  [84-111] 84 (01/24 0456) Resp:  [13-24] 18 (01/24 0456) BP: (96-128)/(60-84) 98/60 mmHg (01/24  0456) SpO2:  [93 %-100 %] 99 % (01/24 0456) Weight:  [132 lb (59.875 kg)] 132 lb (59.875 kg) (01/23 1604) Physical Exam: Gen: appears malnourished, able to sit in bed for exam Oropharynx: clear, moist Neck: supple,  no LAD CV: regular rate and rythm. S1 & S2 audible, no murmurs. Resp: no apparent increased work of breathing, some crackles over his lower lung fields, good air movement GI: bowel sounds normal, tenderness to mild palpation globally, guarding. Not able to assess organomegaly or fluid due to tenderness. GU: no suprapubic tenderness Skin: no lesion MSK: severe muscular mass loss in his extremities, contracture and twitching in his right arm with fingers in variable flexion contractures Neuro: awake, alert, oriented Laboratory:  Recent Labs Lab 11/02/15 1948  WBC 13.7*  HGB 8.9*  HCT 29.1*  PLT 524*    Recent Labs Lab 11/02/15 1948  NA 138  K 3.6  CL 103  CO2 23  BUN 12  CREATININE 0.80  CALCIUM 8.2*  PROT 5.8*  BILITOT 0.5  ALKPHOS 80  ALT 10*  AST 18  GLUCOSE 100*    Imaging/Diagnostic Tests: Dg Chest 2 View  11/02/2015  CLINICAL DATA:  Stomach pain for 1 month. Smoker. Initial encounter. EXAM: CHEST  2 VIEW COMPARISON:  PA and lateral chest 07/02/2013.  CT chest 10/27/2011. FINDINGS: Linear atelectasis is seen in the right lung base. The lungs are otherwise clear. Heart size is normal. No pneumothorax. Tiny bilateral pleural effusions are seen. IMPRESSION: Very small bilateral pleural effusions.  Otherwise negative. Electronically Signed   By: Inge Rise M.D.   On: 11/02/2015 19:31   Ct Abdomen Pelvis W Contrast  11/02/2015  CLINICAL DATA:  40 pound weight loss in the last 2 months. Abdominal pain. History of pancreatitis and alcoholic cirrhosis. Previous cardiac arrest with anoxic brain injury. EXAM: CT ABDOMEN AND PELVIS WITH CONTRAST TECHNIQUE: Multidetector CT imaging of the abdomen and pelvis was performed using the standard protocol following bolus administration of intravenous contrast. CONTRAST:  156m OMNIPAQUE IOHEXOL 300 MG/ML  SOLN COMPARISON:  09/26/2011 FINDINGS: Atelectasis in the lung bases with small bilateral pleural effusions. Coronary artery  calcifications. Small pericardial effusion. Changes of hepatic cirrhosis with enlargement of the lateral segment left and caudate lobes of the liver. Small cysts in the left lobe of the liver similar to previous study. Free fluid in the upper abdomen, extending along the pericolic gutters to the pelvis. This is likely due to ascites. There is also fluid around the pancreas which appear somewhat heterogeneous and likely indicates acute pancreatitis. Normal enhancement of pancreatic parenchyma without evidence of pancreatic necrosis. No pancreatic ductal dilatation. Calcifications in the head and body of the pancreas likely due to chronic pancreatitis. No loculated fluid collections. Small cystic lesion in the tail of the pancreas measuring 4 mm diameter. Based on size, this is likely benign. Gallbladder and bile ducts appear normal. Spleen, adrenal glands, kidneys, abdominal aorta, inferior vena cava, and retroperitoneal lymph nodes are unremarkable. Mild enlargement of a lymph node in the right cardiophrenic angle, measuring 10 mm diameter. This is nonspecific but may be reactive. This was present previously. Stomach, small bowel, and colon are mostly decompressed. No free air in the abdomen. Pelvis: Prostate gland is not enlarged. Bladder wall is not thickened. Free fluid in the pelvis as above. Diverticula in the colon without inflammatory change. Appendix is not identified. Postoperative changes consistent with inguinal hernia repairs. No destructive bone lesions. IMPRESSION: Peripancreatic fluid likely representing changes of acute pancreatitis.  No evidence of pancreatic necrosis and no loculated fluid collections. Changes of hepatic cirrhosis with diffuse abdominal and pelvic fluid, likely ascites. Small bilateral pleural effusions with atelectasis in the lung bases. Small pericardial effusion. Electronically Signed   By: Lucienne Capers M.D.   On: 11/02/2015 22:50    Mercy Riding, MD 11/03/2015, 8:09  AM PGY-1, [place] Intern pager: [phone], text pages welcome

## 2015-11-03 NOTE — Discharge Summary (Signed)
[facility] Discharge Summary  Patient name: Steven Frey. Medical record number: [id number] Date of birth: 02/17/1956 Age: 60 y.o. Gender: male Date of Admission: 11/02/2015  Date of Discharge: 11/05/2014 Admitting Physician: Leeanne Rio, MD  Primary Care Provider: Chrisandra Netters, MD Consultants: none  Indication for Hospitalization: pancreatitis  Discharge Diagnoses/Problem List:  Patient Active Problem List   Diagnosis Date Noted  . Spontaneous bacterial peritonitis ([place])   . Ascites   . Protein-calorie malnutrition, severe ([place]) 11/03/2015  . Pancreatitis 11/02/2015  . Diastolic dysfunction [id number]  . Major depression, single episode 09/26/2014  . 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 Left inguinal hernia 10/30/2012  . Seizures ([place]) 09/03/2012  . History of CVA (cerebrovascular accident) 01/30/2012  . Anoxic brain damage ([place]) 07/07/2010  . CARDIAC ARREST 05/24/2010  . ANEMIA 03/15/2010  . CIRRHOSIS, ALCOHOLIC 0000000  . Chronic pancreatitis ([place]) 02/23/2010  . ERECTILE DYSFUNCTION 01/26/2009  . Alcohol abuse 01/26/2009  . VISUAL IMPAIRMENT 01/26/2009  . CIGARETTE SMOKER 03/10/2008  . HYPERTENSION, BENIGN 03/10/2008   Disposition: home  Discharge Condition: stable  Discharge Exam:  Temp: [98.2 F (36.8 C)-98.6 F (37 C)] 98.6 F (37 C) (01/26 2051) Pulse Rate: [82-121] 121 (01/26 2051) Resp: [16] 16 (01/26 2051) BP: (100-111)/(65-77) 100/65 mmHg (01/26 2051) SpO2: [96 %-99 %] 96 % (01/26 2051) Physical Exam: Gen: appears malnourished, able to sit in bed for exam Oropharynx: moist MM CV: regular rate and rythm. S1 & S2 audible, no murmurs. Resp: no apparent increased work of breathing, some crackles over his lower lung fields, good air movement GI: bowel sounds normal, tenderness to palpation globally, guarding. Para site clean and dry. MSK: severe muscular mass loss in his  extremities, contracture and twitching in his right arm with fingers Neuro: awake, alert, oriented  Brief Hospital Course:  [name]. is a 60 y.o. male presenting with acute on chronic pancreatitis . PMH is significant for chronic pancreatitis, alcohol use d/o, cirrhosis, hypertension, CVA, CAD, seizure d/o, anemia and depression.  Acute on chronic pancreatitis: improved. likely secondary to alcohol use. His other risk factors are pancrease divisim and statin. Patient with pancreatic duct stenting in 2011 at [facility]. This time, he presented with gradually worsening abdominal pain, nausea, vomiting, diarrhea, poor appetite, and 40 lbs weight loss over a course of a year. His abdominal exam was remarkable for global tenderness to light palpation. Lipase elevated to 600. CT abdomen with fluid around pancrease and chronic calcifications but no necrosis. CT also showed ascitic fluid and mild pleural effusion. Lipid panel with low levels likey from malabsorption. WBC 13.7 which was considered as stress reaction vs infection. Statin was held. He was started on rigorous IVF. Pain was managed with fentanyl IV, then transitioned to oxycodone IR. With significant improvement in his symptoms, diet was advanced as tolerated. He was also started on creon on 01/25.  On the day of discharge, patient with 2/10 pain. He tolerated regular diet. Home health nurse and physical therapy ordered. Patient was discharged on CREON and oxycodone. CREON can be expensive if his insurance doesn't cover. So we suggested to him Lipo-Gold or Mega-zyme  from [facility].   Cirrhosis likely secondary to chronic alcohol abuse. asitic fluid noted on abdominal CT. Patient without signs of infection on presentation. WBC elevated only to 13.7 in the setting of pancreatitis. HIV and Hepatitis panel negative in 12/2104. He doesn't have GI for regular follow-up. Child-Pugh Class B (80% 1 year survival),  MELD-Na Score 9 (3 mo mortality 1.9%,  low). Liver enzymes and tbili within normal range this admission. He had US guided paracentesis on 1/25 with removal of 1L ascitic fluid. SAAG 0.6gm/dl suggestive for exudative fluid likely secondary to his pancreatitis. ANC elevated to 266 concerning for SBP although no comment on RBC count. Fluid culture was negative suggesting for culture negative neutrocytic ascites. He received Ceftriaxone 2 gm IV daily (1/26 & 1/27) and discharged on Augmentin to complete 5 days course of acute treatment.Then, he will start Cipro 500 mg daily for prophylaxis.  He was also started on lasix 40 mg daily and spironolactone 100 mg daily on discharge.  Protein-Calorie malnutrition, Severe: 40 lbs weight loss in one year. Likely from pancreatitis, chronic alcohol abuse & poor appetite. Can't rule out malignancy. Anticipate improvement with improvement in his pancreatitis and enzyme supplement.   Anemia, chronic normocytic: stable Hgb 8.3 (8.9 on admission). Hgb 13 about a year ago. Last colonoscopy a year ago with tubular non villous polyp. Vit B12 within normal. Iron panel with low Fe, TIBC and high Ferritin. Retic index 2, borderline for hyperproliferation likely secondory to his mal-nutrition. Anticipate improvement with improvement in his pancreatitis and enzyme supplement.   Issues for Follow Up:  1. Acute on chronic pancreatitis: discharged on CREON and oxycodone 5 mg #60. CREON can be expensive if his insurance doesn't cover. So we suggested to him Lipo-Gold or Mega-zyme  from [facility].  2. Liver Cirrhosis (likely alcoholic).SBP: discharged on Augmentin for three more days, then Cipro 500 mg for prophylaxis. Recommend ambulatory referral to GI. 3. Protein-Calorie malnutrition and anemia of chronic disease: likley secondary to pancreatitis. Anticipate improvement with improvement in his pancreatitis and enzyme supplement. Assess nutrition status and CBC at follow up.  Significant Procedures: paracnetesis  Significant  Labs and Imaging:   Recent Labs Lab 11/02/15 1948 11/03/15 0755 11/04/15 1500 11/06/15 2033  WBC 13.7* 9.6  --  16.4*  HGB 8.9* 7.5* 8.3* 9.4*  HCT 29.1* 24.0* 28.6* 30.5*  PLT 524* 439*  --  384    Recent Labs Lab 11/02/15 1948 11/03/15 0755 11/05/15 0611 11/05/15 1202 11/06/15 1340 11/06/15 2033  NA 138 141 143 139 140 140  K 3.6 3.8 5.6* 3.1* 3.5 3.8  CL 103 108 116* 109 102 102  CO2 23 23 17* 24 28 29   GLUCOSE 100* 75 78 92 149* 118*  BUN 12 8 <5* <5* <5* 7  CREATININE 0.80 0.64 0.70 0.55* 0.61 0.67  CALCIUM 8.2* 7.6* 7.9* 7.5* 8.0* 8.1*  ALKPHOS 80 69  --   --   --  92  AST 18 18  --   --   --  24  ALT 10* 9*  --   --   --  9*  ALBUMIN 2.1* 1.7*  --   --   --  2.1*  Dg Chest 2 View  11/02/2015  CLINICAL DATA:  Stomach pain for 1 month. Smoker. Initial encounter. EXAM: CHEST  2 VIEW COMPARISON:  PA and lateral chest 07/02/2013.  CT chest 10/27/2011. FINDINGS: Linear atelectasis is seen in the right lung base. The lungs are otherwise clear. Heart size is normal. No pneumothorax. Tiny bilateral pleural effusions are seen. IMPRESSION: Very small bilateral pleural effusions.  Otherwise negative. Electronically Signed   By: Inge Rise M.D.   On: 11/02/2015 19:31   Ct Abdomen Pelvis W Contrast  11/02/2015  CLINICAL DATA:  40 pound weight loss in the last 2 months. Abdominal pain.  History of pancreatitis and alcoholic cirrhosis. Previous cardiac arrest with anoxic brain injury. EXAM: CT ABDOMEN AND PELVIS WITH CONTRAST TECHNIQUE: Multidetector CT imaging of the abdomen and pelvis was performed using the standard protocol following bolus administration of intravenous contrast. CONTRAST:  179mL OMNIPAQUE IOHEXOL 300 MG/ML  SOLN COMPARISON:  09/26/2011 FINDINGS: Atelectasis in the lung bases with small bilateral pleural effusions. Coronary artery calcifications. Small pericardial effusion. Changes of hepatic cirrhosis with enlargement of the lateral segment left and caudate  lobes of the liver. Small cysts in the left lobe of the liver similar to previous study. Free fluid in the upper abdomen, extending along the pericolic gutters to the pelvis. This is likely due to ascites. There is also fluid around the pancreas which appear somewhat heterogeneous and likely indicates acute pancreatitis. Normal enhancement of pancreatic parenchyma without evidence of pancreatic necrosis. No pancreatic ductal dilatation. Calcifications in the head and body of the pancreas likely due to chronic pancreatitis. No loculated fluid collections. Small cystic lesion in the tail of the pancreas measuring 4 mm diameter. Based on size, this is likely benign. Gallbladder and bile ducts appear normal. Spleen, adrenal glands, kidneys, abdominal aorta, inferior vena cava, and retroperitoneal lymph nodes are unremarkable. Mild enlargement of a lymph node in the right cardiophrenic angle, measuring 10 mm diameter. This is nonspecific but may be reactive. This was present previously. Stomach, small bowel, and colon are mostly decompressed. No free air in the abdomen. Pelvis: Prostate gland is not enlarged. Bladder wall is not thickened. Free fluid in the pelvis as above. Diverticula in the colon without inflammatory change. Appendix is not identified. Postoperative changes consistent with inguinal hernia repairs. No destructive bone lesions. IMPRESSION: Peripancreatic fluid likely representing changes of acute pancreatitis. No evidence of pancreatic necrosis and no loculated fluid collections. Changes of hepatic cirrhosis with diffuse abdominal and pelvic fluid, likely ascites. Small bilateral pleural effusions with atelectasis in the lung bases. Small pericardial effusion. Electronically Signed   By: Lucienne Capers M.D.   On: 11/02/2015 22:50   Results/Tests Pending at Time of Discharge: none  Discharge Medications:    Medication List    STOP taking these medications        ALEVE PO     atorvastatin 40  MG tablet  Commonly known as:  LIPITOR     tiZANidine 4 MG tablet  Commonly known as:  ZANAFLEX      TAKE these medications        amLODipine 5 MG tablet  Commonly known as:  NORVASC  TAKE 1 TABLET (5 MG TOTAL) BY MOUTH DAILY.     amoxicillin-clavulanate 875-125 MG tablet  Commonly known as:  AUGMENTIN  Take 1 tablet by mouth 2 (two) times daily. Starting 11/07/2015     carvedilol 3.125 MG tablet  Commonly known as:  COREG  TAKE 1 TABLET BY MOUTH 2 TIMES DAILY WITH A MEAL.     ciprofloxacin 500 MG tablet  Commonly known as:  CIPRO  Take 1 tablet (500 mg total) by mouth daily. Starting 11/10/2015     [employer] B-12 1000 MCG Tbcr  Generic drug:  Cyanocobalamin  TAKE 1 TABLET BY MOUTH DAILY     folic acid 1 MG tablet  Commonly known as:  FOLVITE  TAKE 1 TABLET BY MOUTH DAILY     furosemide 40 MG tablet  Commonly known as:  LASIX  Take 1 tablet (40 mg total) by mouth daily.     levETIRAcetam 1000 MG tablet  Commonly  known as:  KEPPRA  TAKE 1 TABLET BY MOUTH 2 TIMES DAILY.     lipase/protease/amylase 12000 units Cpep capsule  Commonly known as:  CREON  Take 1 capsule (12,000 Units total) by mouth 3 (three) times daily before meals.     oxyCODONE 5 MG immediate release tablet  Commonly known as:  Oxy IR/ROXICODONE  Take 1 tablet (5 mg total) by mouth every 4 (four) hours as needed for moderate pain or severe pain.     polyethylene glycol powder powder  Commonly known as:  GLYCOLAX/MIRALAX  Take 17 g by mouth daily as needed.     promethazine 12.5 MG tablet  Commonly known as:  PHENERGAN  Take 1 tablet (12.5 mg total) by mouth every 6 (six) hours as needed for nausea or vomiting.     sertraline 100 MG tablet  Commonly known as:  ZOLOFT  TAKE 1 TABLET EVERY DAY     spironolactone 100 MG tablet  Commonly known as:  ALDACTONE  Take 1 tablet (100 mg total) by mouth daily.     thiamine 100 MG tablet  Commonly known as:  VITAMIN B-1  Take 1 tablet (100 mg total) by  mouth daily.        Discharge Instructions: Please refer to Patient Instructions section of EMR for full details.  Patient was counseled important signs and symptoms that should prompt return to medical care, changes in medications, dietary instructions, activity restrictions, and follow up appointments.   Follow-Up Appointments: Follow-up Information    Follow up with Mercy Riding, MD On 11/12/2015.   Specialty:  Family Medicine   Why:  at 2pm for hosp f/u on pancreatitis and cirrhosis   Contact information:   [place] [address] [phone]       Follow up with [facility].   Specialty:  Home Health Services   Why:  Someone from [facility] will contact you concerning start date and time for therapy.   Contact information:   [place] [address] [phone]      Mercy Riding, MD 11/07/2015, 8:24 PM PGY-1, [place]

## 2015-11-04 ENCOUNTER — Inpatient Hospital Stay (HOSPITAL_COMMUNITY): Payer: Medicare Other

## 2015-11-04 LAB — GLUCOSE, CAPILLARY
Glucose-Capillary: 104 mg/dL — ABNORMAL HIGH (ref 65–99)
Glucose-Capillary: 90 mg/dL (ref 65–99)

## 2015-11-04 LAB — BODY FLUID CELL COUNT WITH DIFFERENTIAL
Eos, Fluid: 0 %
Lymphs, Fluid: 1 %
Monocyte-Macrophage-Serous Fluid: 67 % (ref 50–90)
Neutrophil Count, Fluid: 32 % — ABNORMAL HIGH (ref 0–25)
Total Nucleated Cell Count, Fluid: 833 cu mm (ref 0–1000)

## 2015-11-04 LAB — ALBUMIN, FLUID (OTHER): Albumin, Fluid: 1.1 g/dL

## 2015-11-04 LAB — GRAM STAIN

## 2015-11-04 LAB — GLUCOSE, SEROUS FLUID: Glucose, Fluid: 104 mg/dL

## 2015-11-04 LAB — PROTEIN, BODY FLUID: Total protein, fluid: <3 g/dL

## 2015-11-04 LAB — HEMOGLOBIN AND HEMATOCRIT, BLOOD
HCT: 28.6 % — ABNORMAL LOW (ref 39.0–52.0)
Hemoglobin: 8.3 g/dL — ABNORMAL LOW (ref 13.0–17.0)

## 2015-11-04 LAB — LACTATE DEHYDROGENASE, PLEURAL OR PERITONEAL FLUID: LD, Fluid: 250 U/L — ABNORMAL HIGH (ref 3–23)

## 2015-11-04 MED ORDER — FENTANYL CITRATE (PF) 100 MCG/2ML IJ SOLN
50.0000 ug | Freq: Four times a day (QID) | INTRAMUSCULAR | Status: DC | PRN
  Administered 2015-11-04: 50 ug via INTRAVENOUS
  Filled 2015-11-04: qty 2

## 2015-11-04 MED ORDER — PANCRELIPASE (LIP-PROT-AMYL) 12000-38000 UNITS PO CPEP
12000.0000 [IU] | ORAL_CAPSULE | Freq: Three times a day (TID) | ORAL | Status: DC
  Administered 2015-11-04 – 2015-11-06 (×6): 12000 [IU] via ORAL
  Filled 2015-11-04 (×8): qty 1

## 2015-11-04 MED ORDER — OXYCODONE HCL 5 MG PO TABS
5.0000 mg | ORAL_TABLET | ORAL | Status: DC | PRN
  Administered 2015-11-04 (×2): 10 mg via ORAL
  Administered 2015-11-04: 5 mg via ORAL
  Administered 2015-11-05 – 2015-11-06 (×8): 10 mg via ORAL
  Filled 2015-11-04: qty 2
  Filled 2015-11-04: qty 1
  Filled 2015-11-04 (×9): qty 2

## 2015-11-04 MED ORDER — OXYCODONE HCL 5 MG PO TABS
5.0000 mg | ORAL_TABLET | ORAL | Status: DC | PRN
  Administered 2015-11-04: 5 mg via ORAL
  Filled 2015-11-04: qty 1

## 2015-11-04 MED ORDER — LIDOCAINE HCL (PF) 1 % IJ SOLN
INTRAMUSCULAR | Status: AC
  Filled 2015-11-04: qty 10

## 2015-11-04 MED ORDER — ENOXAPARIN SODIUM 30 MG/0.3ML ~~LOC~~ SOLN
30.0000 mg | SUBCUTANEOUS | Status: DC
  Administered 2015-11-04 – 2015-11-06 (×3): 30 mg via SUBCUTANEOUS
  Filled 2015-11-04 (×3): qty 0.3

## 2015-11-04 NOTE — Evaluation (Signed)
Physical Therapy Evaluation Patient Details Name: Steven Frey. MRN: [id number] DOB: 09-21-1956 Today's Date: 11/04/2015   History of Present Illness  60 yo M with history of alcoholic cirrhosis, current ETOH abuse, and previous recurrent pancreatitis (last episode in 2011, underwent pancreatic duct stenting at [facility]) presenting with 1 month of abdominal pain, poor PO intake, and weight loss. 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 Found to have acute on chronic pancreatitis  Clinical Impression  Pt admitted with above diagnosis. Pt currently with functional limitations due to the deficits listed below (see PT Problem List). Pt will benefit from skilled PT to increase their independence and safety with mobility to allow discharge to the venue listed below.  At this time the patient is ambulatory with SPC and min guard X 75 feet. Sit/stand transfers are at supervision level and bed mobility is modified independent. PT to continue to follow to progress mobility with anticipation of D/C to home with family support.      Follow Up Recommendations No PT follow up;Supervision for mobility/OOB    Equipment Recommendations  None recommended by PT;Other (comment) (patient reports having SPC at home)    Recommendations for Other Services       Precautions / Restrictions Precautions Precautions: Fall Precaution Comments: reports 2 falls over last 6 months Restrictions Weight Bearing Restrictions: No      Mobility  Bed Mobility Overal bed mobility: Modified Independent             General bed mobility comments: sit to supine, bed flat, no use of rails  Transfers Overall transfer level: Needs assistance Equipment used: Straight cane Transfers: Sit to/from Stand Sit to Stand: Supervision         General transfer comment: S with SPC ambulating in room to bathroom>door>stand while I moved  recliner> then to recliner to sit  Ambulation/Gait Ambulation/Gait assistance: Min guard Ambulation Distance (Feet): 75 Feet Assistive device: Straight cane Gait Pattern/deviations: Step-through pattern;Decreased step length - left Gait velocity: decreased   General Gait Details: no gross loss of balance, limited distance due to patient reports feeling mildy dizzy.   Stairs            Wheelchair Mobility    Modified Rankin (Stroke Patients Only)       Balance Overall balance assessment: Needs assistance Sitting-balance support: No upper extremity supported;Feet supported Sitting balance-Leahy Scale: Good     Standing balance support: Single extremity supported Standing balance-Leahy Scale: Poor Standing balance comment: using SPC while standing                             Pertinent Vitals/Pain Pain Assessment: 0-10 Pain Score: 8  Pain Location: abdomen Pain Descriptors / Indicators:  (hurts) Pain Intervention(s): Limited activity within patient's tolerance;Monitored during session    Home Living Family/patient expects to be discharged to:: Private residence Living Arrangements: Parent Available Help at Discharge: Family;Available 24 hours/day Type of Home: Mobile home Home Access: Stairs to enter Entrance Stairs-Rails: Left Entrance Stairs-Number of Steps: 3 Home Layout: One level Home Equipment: Cane - single point;Bedside commode Additional Comments: patient states that he could have a brother or sister stay with him if needed.     Prior Function Level of Independence: Independent with assistive device(s)         Comments: reports using SPC     Hand Dominance  Dominant Hand: Right (but has become left handed since stroke in 2011)    Extremity/Trunk Assessment   Upper Extremity Assessment: Defer to OT evaluation RUE Deficits / Details: contractures from CVA in 2011. He can actively extend elbow almost to neutral and he can do AROM  shoulder flexion to almost 90 degrees, supination to neutral, no wrist motion, and non functional trace movement of digits (some are flexed and some are extended)         Lower Extremity Assessment: RLE deficits/detail;LLE deficits/detail RLE Deficits / Details: overall decresed strength due to prior CVA, no observed active dorsiflexion/plantarflexion. Patient is able to perform SLR independently with lag.  LLE Deficits / Details: Grossly WFL     Communication   Communication: No difficulties  Cognition Arousal/Alertness: Awake/alert Behavior During Therapy: WFL for tasks assessed/performed Overall Cognitive Status: No family/caregiver present to determine baseline cognitive functioning                      General Comments      Exercises        Assessment/Plan    PT Assessment Patient needs continued PT services  PT Diagnosis Difficulty walking;Generalized weakness   PT Problem List Decreased strength;Decreased activity tolerance;Decreased balance;Decreased mobility;Pain  PT Treatment Interventions Gait training;Stair training;Functional mobility training;Therapeutic activities;Therapeutic exercise;Patient/family education   PT Goals (Current goals can be found in the Care Plan section) Acute Rehab PT Goals Patient Stated Goal: get rid of stomach pain and then go back home.  PT Goal Formulation: With patient Time For Goal Achievement: 11/18/15 Potential to Achieve Goals: Good    Frequency Min 3X/week   Barriers to discharge        Co-evaluation               End of Session Equipment Utilized During Treatment: Gait belt Activity Tolerance: Patient limited by fatigue;Other (comment) (reports of mild dizziness) Patient left: in bed;with call bell/phone within reach;with bed alarm set Nurse Communication: Mobility status         Time: [id number] PT Time Calculation (min) (ACUTE ONLY): 17 min   Charges:   PT Evaluation $PT Eval Moderate  Complexity: 1 Procedure     PT G Codes:        Cassell Clement, PT, CSCS Pager [phone] Office [phone]  11/04/2015, 2:46 PM

## 2015-11-04 NOTE — Procedures (Signed)
Ultrasound-guided diagnostic and therapeutic paracentesis performed yielding 1 liter (maximum ordered) of turbid, reddish brown fluid. No immediate complications. A portion of the fluid was sent to the lab for preordered studies.

## 2015-11-04 NOTE — Progress Notes (Signed)
Family Medicine Teaching Service Daily Progress Note Intern Pager: [phone]  Patient name: Steven Frey. Medical record number: [id number] Date of birth: 12-19-1955 Age: 60 y.o. Gender: male  Primary Care Provider: Chrisandra Netters, MD Consultants: none Code Status: full  Pt Overview and Major Events to Date:  1/23-admitted with acute pancreatitis  Assessment and Plan: Candice Lunney. is a 60 y.o. male presenting with acute on chronic pancreatitis . PMH is significant for chronic pancreatitis, alcohol use d/o, cirrhosis, hypertension, CVA, CAD, seizure d/o, anemia and depression.  Acute on chronic pancreatitis, likely secondary to alcohol abuse: improving. his other risks are divisim and statin. Lipase elevated to 600. CT abdomen with fluid around pancrease and chronic calcifications but no necrosis. Also ascitic fluid and mild pleural effusion. Patient with pancreatic duct stenting in 2011 at [facility]. WBC 13.7 likely stress induced.  -Unlikely to be severe by [place] (with or without LDH) and BISAP scores. -Advancing diet today -Status 500 ml bolus and 1.35mVF. D5NS overnight -Hold of antibiotics given low suspicion for infection now -Hold home statin -Lipid panel low cholesterol &TG suggestive for poor absorption. Consider creon but not sure if he has Part D. [facility] fentanyl 50 mcg q2h as needed severe pain  Seizure disorder: stable. He is on Keppra 1000 mg twice a day at home.  -IV Keppra 1000 mg twice a day starting now  H/o CVA, MI in 2011: stable. reports that this is secondary his heart attack and pancreatitis in 2011. No history of CAD in his chart. Last stress test in 2014 and last Echo in 2016 within normal. His exam with contractures in his right arm and left sided weakness -Holding his statin in the setting of pancreatitis  Cirrhosis likely Alcohlic, secondary to chronic alcohol abuse Not established with GI for regular  follow-up. Child-Pugh Classification Grade A (100% > 85% survival 1-2 years), MELD Score 9 (3 mo mortality 1.9%, LOW). Mention of some asitic fluid on abdominal CT. No transaminitis or elevated alk phos this admission. Last drink 3 days ago. -Diagnostic and therapeutic para -F/u fluid chemistry, cytology and culture -CIWA 0>0>2>1  Anemia, chronic normocytic: Hgb 7.5 (8.9 on admission) likley from hemodilution. Hgb 13 about a year ago. Last colonoscopy a year ago with tubular non villous polyp. Vit B12 within normal. Iron panel with low Fe, TIBC and high Ferritin -f/u cbc  Hypertension: normotensive here. On amlodipine 5 mg anc coreg 3.125 mg twice a day  -hold home meds for now, given poor PO and IV analgesia without significant HTN on admit.  Protein-Calorie malnutrition, Severe: 40 lbs weight loss in one year. Likely from pancreatitis, chronic alcohol abuse, poor appetite. Can't rule out malignancy -Nutrition consult when he starts by mouth   Depression: stable. On zoloft at home -hold while NPO (consider resuming sips with meds)  FEN/GI: -Advance diet as tolerated -D5NS at maintenance rate  Prophylaxis: Lovenox SQ. Plt in 500's  Disposition: diagnostic and therapeutic para today. Advancing diet as tolerated. Continue pain mgt and IVF  Subjective:  Continues to report 4/10 abdominal pain. Reports nausea but vomiting or diarrhea since admission. Fentanyl helping with pain.  Objective: Temp:  [97.8 F (36.6 C)-98.2 F (36.8 C)] 97.8 F (36.6 C) (01/25 0446) Pulse Rate:  [79-85] 82 (01/25 0446) Resp:  [18] 18 (01/25 0446) BP: (105-115)/(66-73) 107/69 mmHg (01/25 0446) SpO2:  [98 %-100 %] 100 % (01/25 0446) Physical Exam: Gen: appears malnourished, able to sit in bed  for exam Oropharynx: appears dry CV: regular rate and rythm. S1 & S2 audible, no murmurs. Resp: no apparent increased work of breathing, some crackles over his lower lung fields, good air movement GI: bowel  sounds normal, tenderness to mild palpation globally, guarding. Not able to assess organomegaly or fluid due to tenderness. But abdomen doesn't look big. MSK: severe muscular mass loss in his extremities, contracture and twitching in his right arm with fingers in variable flexion contractures Neuro: awake, alert, oriented Laboratory:  Recent Labs Lab 11/02/15 1948 11/03/15 0755  WBC 13.7* 9.6  HGB 8.9* 7.5*  HCT 29.1* 24.0*  PLT 524* 439*    Recent Labs Lab 11/02/15 1948 11/03/15 0755  NA 138 141  K 3.6 3.8  CL 103 108  CO2 23 23  BUN 12 8  CREATININE 0.80 0.64  CALCIUM 8.2* 7.6*  PROT 5.8* 4.5*  BILITOT 0.5 0.7  ALKPHOS 80 69  ALT 10* 9*  AST 18 18  GLUCOSE 100* 75    Imaging/Diagnostic Tests: No results found.  Mercy Riding, MD 11/04/2015, 6:55 AM PGY-1, [place] Intern pager: [phone], text pages welcome

## 2015-11-04 NOTE — Progress Notes (Signed)
PT Cancellation Note  Patient Details Name: Steven Frey. MRN: [id number] DOB: Apr 16, 1956   Cancelled Treatment:    Reason Eval/Treat Not Completed: Other (comment). Patient just returning from procedure, nursing requests recheck in afternoon. Will follow as able.    Cassell Clement, PT, CSCS Pager [phone] Office [phone]  11/04/2015, 10:51 AM

## 2015-11-04 NOTE — Evaluation (Signed)
Occupational Therapy Evaluation Patient Details Name: Steven Frey. MRN: [id number] DOB: February 05, 1956 Today's Date: 11/04/2015    History of Present Illness 60 yo M with history of alcoholic cirrhosis, current ETOH abuse, and previous recurrent pancreatitis (last episode in 2011, underwent pancreatic duct stenting at [facility] presenting with 1 month of abdominal pain, poor PO intake, and weight loss. 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 Found to have acute on chronic pancreatitis   Clinical Impression   This 60 yo male admitted with above presents to acute OT at an overall S level for basic ADLs using SPC as AD and he will have S at home from his dad whom he lives with. No further OT needs, we will sign off.    Follow Up Recommendations  No OT follow up    Equipment Recommendations  None recommended by OT       Precautions / Restrictions Precautions Precautions: Fall (due to h/o falls) Restrictions Weight Bearing Restrictions: No      Mobility Bed Mobility Overal bed mobility: Modified Independent             General bed mobility comments: HOB down and not rail coming out on left side (he normally sleeps on his couch comiing out on left)  Transfers Overall transfer level: Needs assistance Equipment used: Straight cane Transfers: Sit to/from Stand Sit to Stand: Supervision         General transfer comment: S with SPC ambulating in room to bathroom>door>stand while I moved recliner> then to recliner to sit    Balance Overall balance assessment: Needs assistance Sitting-balance support: No upper extremity supported;Feet supported Sitting balance-Leahy Scale: Good     Standing balance support: Single extremity supported;During functional activity   Standing balance comment: requires use of SPC                            ADL                                         General ADL Comments: Overall pt at a S level for basic ADLs with use of a SPC for ambulation to peform these tasks     Vision Additional Comments: Wears glasses all the time (they are dark transition glasses), reports mild deficits from prior CVA in 2011 but that he just turns his head to the right more to see things when he needs to. He does not drive          Pertinent Vitals/Pain Pain Assessment: 0-10 Pain Score: 9  Pain Location: abdomen Pain Descriptors / Indicators: Sharp;Stabbing Pain Intervention(s): Monitored during session;Repositioned;Patient requesting pain meds-RN notified (RN did give pt PO pain meds at beginning of session, pt now requesting IV pain meds)     Hand Dominance Right (but has become left handed since stroke in 2011)   Extremity/Trunk Assessment Upper Extremity Assessment Upper Extremity Assessment: RUE deficits/detail RUE Deficits / Details: contractures from CVA in 2011. He can actively extend elbow almost to neutral and he can do AROM shoulder flexion to almost 90 degrees, supination to neutral, no wrist motion, and non functional trace movement of digits (some are flexed and some are extended)           Communication Communication Communication: Expressive  difficulties (slower to get words out at times)   Cognition Arousal/Alertness: Awake/alert Behavior During Therapy: WFL for tasks assessed/performed Overall Cognitive Status: History of cognitive impairments - at baseline (slower to get words out at times and increased time to tell me month and year)                                Home Living Family/patient expects to be discharged to:: Private residence Living Arrangements: Parent Available Help at Discharge: Family Type of Home: Mobile home Home Access: Stairs to enter Technical brewer of Steps: 3 Entrance Stairs-Rails: Left (usually holds SPC under right arm and uses left arm on handrail) Home  Layout: One level     Bathroom Shower/Tub: Walk-in shower (but currently sponge bathes due to floor not good around toilet and have to go past toilet to get to walk in shower)   Bathroom Toilet: [place] - single point;Bedside commode               OT Diagnosis: Generalized weakness;Acute pain         OT Goals(Current goals can be found in the care plan section) Acute Rehab OT Goals Patient Stated Goal: to get this pain under control and go home  OT Frequency:                End of Session Equipment Utilized During Treatment: Gait belt [facility]) Nurse Communication:  (nurse student ---pain worse now that up and in recliner)  Activity Tolerance: Patient tolerated treatment well (until end of session and seated in recliner then 9/10 sharp pain in abdomen--RN made aware) Patient left: in chair;with call bell/phone within reach;with chair alarm set   Time: 1230-1257 OT Time Calculation (min): 27 min Charges:  OT General Charges $OT Visit: 1 Procedure OT Evaluation $OT Eval Moderate Complexity: 1 Procedure OT Treatments $Self Care/Home Management : 8-22 mins  Almon Register [id number] 11/04/2015, 1:22 PM

## 2015-11-04 NOTE — Progress Notes (Signed)
Pt to interventional radiology for paracentesis. Pt VSS. No change from AM assessment.

## 2015-11-04 NOTE — Progress Notes (Signed)
Pt back from interventional radiology following paracentesis. Pt repts "pain" but no other c/o distress. VSS. No change from AM assessment except pt now has a bandaid to L abdomen. Will continue to monitor.

## 2015-11-05 DIAGNOSIS — K860 Alcohol-induced chronic pancreatitis: Secondary | ICD-10-CM

## 2015-11-05 DIAGNOSIS — K652 Spontaneous bacterial peritonitis: Secondary | ICD-10-CM | POA: Insufficient documentation

## 2015-11-05 DIAGNOSIS — R188 Other ascites: Secondary | ICD-10-CM

## 2015-11-05 LAB — BASIC METABOLIC PANEL
Anion gap: 10 (ref 5–15)
Anion gap: 6 (ref 5–15)
BUN: <5 mg/dL — ABNORMAL LOW (ref 6–20)
BUN: <5 mg/dL — ABNORMAL LOW (ref 6–20)
CO2: 17 mmol/L — ABNORMAL LOW (ref 22–32)
CO2: 24 mmol/L (ref 22–32)
Calcium: 7.9 mg/dL — ABNORMAL LOW (ref 8.9–10.3)
Calcium: 7.5 mg/dL — ABNORMAL LOW (ref 8.9–10.3)
Chloride: 116 mmol/L — ABNORMAL HIGH (ref 101–111)
Chloride: 109 mmol/L (ref 101–111)
Creatinine, Ser: 0.7 mg/dL (ref 0.61–1.24)
Creatinine, Ser: 0.55 mg/dL — ABNORMAL LOW (ref 0.61–1.24)
GFR calc Af Amer: >60 mL/min (ref >60)
GFR calc Af Amer: >60 mL/min (ref >60)
GFR calc non Af Amer: >60 mL/min (ref >60)
GFR calc non Af Amer: >60 mL/min (ref >60)
Glucose, Bld: 78 mg/dL (ref 65–99)
Glucose, Bld: 92 mg/dL (ref 65–99)
Potassium: 5.6 mmol/L — ABNORMAL HIGH (ref 3.5–5.1)
Potassium: 3.1 mmol/L — ABNORMAL LOW (ref 3.5–5.1)
Sodium: 143 mmol/L (ref 135–145)
Sodium: 139 mmol/L (ref 135–145)

## 2015-11-05 LAB — GLUCOSE, CAPILLARY
Glucose-Capillary: 105 mg/dL — ABNORMAL HIGH (ref 65–99)
Glucose-Capillary: 134 mg/dL — ABNORMAL HIGH (ref 65–99)
Glucose-Capillary: 90 mg/dL (ref 65–99)
Glucose-Capillary: 95 mg/dL (ref 65–99)

## 2015-11-05 LAB — PH, BODY FLUID: pH, Body Fluid: 7.8

## 2015-11-05 LAB — TRIGLYCERIDES, BODY FLUIDS: Triglycerides, Fluid: 27 mg/dL

## 2015-11-05 LAB — AMYLASE, PERITONEAL FLUID: Amylase, peritoneal fluid: 8324 U/L

## 2015-11-05 MED ORDER — FUROSEMIDE 40 MG PO TABS
40.0000 mg | ORAL_TABLET | Freq: Every day | ORAL | Status: DC
  Administered 2015-11-05 – 2015-11-06 (×2): 40 mg via ORAL
  Filled 2015-11-05 (×2): qty 1

## 2015-11-05 MED ORDER — BOOST / RESOURCE BREEZE PO LIQD: 1.0000 | Freq: Three times a day (TID) | ORAL | Status: DC

## 2015-11-05 MED ORDER — DEXTROSE 5 % IV SOLN
2.0000 g | INTRAVENOUS | Status: DC
  Administered 2015-11-05 – 2015-11-06 (×2): 2 g via INTRAVENOUS
  Filled 2015-11-05 (×2): qty 2

## 2015-11-05 MED ORDER — LEVETIRACETAM 500 MG PO TABS
1000.0000 mg | ORAL_TABLET | Freq: Two times a day (BID) | ORAL | Status: DC
  Administered 2015-11-05 – 2015-11-06 (×3): 1000 mg via ORAL
  Filled 2015-11-05 (×3): qty 2

## 2015-11-05 MED ORDER — ENSURE ENLIVE PO LIQD
237.0000 mL | Freq: Two times a day (BID) | ORAL | Status: DC
  Administered 2015-11-05 – 2015-11-06 (×2): 237 mL via ORAL

## 2015-11-05 MED ORDER — POTASSIUM CHLORIDE CRYS ER 20 MEQ PO TBCR
40.0000 meq | EXTENDED_RELEASE_TABLET | Freq: Once | ORAL | Status: AC
  Administered 2015-11-05: 40 meq via ORAL
  Filled 2015-11-05: qty 2

## 2015-11-05 NOTE — Progress Notes (Addendum)
Nutrition Follow-up  DOCUMENTATION CODES:   Severe malnutrition in context of chronic illness  INTERVENTION:  Provide Ensure Enlive po BID, each supplement provides 350 kcal and 20 grams of protein.  Encourage adequate PO intake.   NUTRITION DIAGNOSIS:   Malnutrition related to chronic illness as evidenced by percent weight loss, energy intake < or equal to 75% for > or equal to 1 month, severe depletion of body fat; ongoing  GOAL:   Patient will meet greater than or equal to 90% of their needs; met  MONITOR:   Diet advancement, Weight trends, Labs, I & O's  REASON FOR ASSESSMENT:   Consult Poor PO  ASSESSMENT:   60 y.o. male presenting with acute on chronic pancreatitis . PMH is significant for chronic pancreatitis, alcohol use d/o, cirrhosis, hypertension, CVA, CAD, seizure d/o, anemia and depression. Pt underwent paracentesis 1/25, yielding 1 liter of fluid.   Diet has been advanced to a clear liquid diet. Meal completion has been 100%. RD to order Boost Breeze to aid in caloric and protein needs. RD to continue to monitor.   Addendum 1421 pm: Diet has been advanced to a regular diet. RD to order Ensure to aid in caloric and protein needs. Boost Breeze will be discontinued.   Potassium elevated at 5.6.  Diet Order:  Diet clear liquid Room service appropriate?: Yes; Fluid consistency:: Thin  Skin:  Reviewed, no issues  Last BM:  1/23  Height:   Ht Readings from Last 1 Encounters:  11/02/15 _0  (1.727 m)    Weight:   Wt Readings from Last 1 Encounters:  11/02/15 132 lb (59.875 kg)    Ideal Body Weight:  70 kg  BMI:  There is no weight on file to calculate BMI.  Estimated Nutritional Needs:   Kcal:  1900-2100  Protein:  85-100 grams  Fluid:  1.9 - 2.1 L/day  EDUCATION NEEDS:   No education needs identified at this time  Corrin Parker, MS, RD, LDN Pager # [phone] After hours/ weekend pager # [phone]

## 2015-11-05 NOTE — Care Management Important Message (Signed)
Important Message  Patient Details  Name: Steven Frey. MRN: [id number] Date of Birth: 08-01-1956   Medicare Important Message Given:  Yes    Delorse Lek 11/05/2015, 2:33 PM

## 2015-11-05 NOTE — Progress Notes (Signed)
Family Medicine Teaching Service Daily Progress Note Intern Pager: [phone]  Patient name: Steven Frey. Medical record number: [id number] Date of birth: Feb 28, 1956 Age: 60 y.o. Gender: male  Primary Care Provider: Chrisandra Netters, MD Consultants: none Code Status: full  Pt Overview and Major Events to Date:  1/23-admitted with acute pancreatitis  Assessment and Plan: Jeevan Kalla. is a 60 y.o. male presenting with acute on chronic pancreatitis . PMH is significant for chronic pancreatitis, alcohol use d/o, cirrhosis, hypertension, CVA, CAD, seizure d/o, anemia and depression.  Acute on chronic pancreatitis, likely secondary to alcohol abuse: improving. Pain 4/10 today. Tolerating by mouth so far. Denies emesis or diarrhea. -On Oxycodone 100m q4h as needed + fentanyl 592m four times a day for breakthrough pain. Didn't need fentanyl. So discontinued it. -Advancing diet. -Holding home statin -Lipid panel low cholesterol &TG suggestive for poor absorption. Started creon on 01/25. [facility] if [facility] unable to afford Creon.  Seizure disorder: stable. He is on Keppra 1000 mg twice a day at home.  -transition to by mouth keppra today  H/o CVA, MI in 2011: stable. reports that this is secondary his heart attack and pancreatitis in 2011. No history of CAD in his chart. Last stress test in 2014 and last Echo in 2016 within normal. 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 Child-Pugh Classification Grade A (100% > 85% survival 1-2 years), MELD Score 9 (3 mo mortality 1.9%, LOW). Asitic fluid on abdominal CT. No transaminitis or elevated alk phos this admission. Para on 1/25, and took off 1L. SAAG 0.6 suggestive for exudation. Pancreatitis could contribute to this. [id number]  concerning for SBP. -Ceftriaxone while in patient -Discharge on Levaquin to complete 5 days course -Consider Bactrim for PPX if K within normal range -Lasix 40 mg daily by mouth  -Will add spironolactone if K normalizes -F/u fluid culture -CIWA 0>0  Hyperkalemia: K 5.9 this morning. Unclear cause. -Repeat BMP  Anemia, chronic normocytic: stable Hgb 8.3 (8.9 on admission). Hgb 13 about a year ago. Last colonoscopy a year ago with tubular non villous polyp. Vit B12 within normal. Iron panel with low Fe, TIBC and high Ferritin. Retic index 2 borderline for hyperproliferation likely secondory to his mal-nutrition. -f/u cbc -f/u nutrition consult  Hypertension: normotensive here. On amlodipine 5 mg anc coreg 3.125 mg twice a day  -hold home meds for now  Protein-Calorie malnutrition, Severe: 40 lbs weight loss in one year. Likely from pancreatitis, chronic alcohol abuse, poor appetite. Can't rule out malignancy -f/u nutrition consult   Depression: stable. On zoloft at home -continue home zoloft  FEN/GI: -Advance diet as tolerated -KVO  Prophylaxis: Lovenox SQ.   Disposition: home pending clinical improvement  Subjective:  Reports 4/10 pain this morning. Some nausea but no emesis or diarrhea. Had some jello and soup this morning.  Objective: Temp:  [98.1 F (36.7 C)-98.5 F (36.9 C)] 98.5 F (36.9 C) (01/25 1935) Pulse Rate:  [79-80] 80 (01/25 1935) Resp:  [18] 18 (01/25 1935) BP: (105-128)/(62-77) 128/77 mmHg (01/25 1935) SpO2:  [99 %-100 %] 99 % (01/25 1935) Physical Exam: Gen: appears malnourished, able to sit in bed for exam Oropharynx: appears dry CV: regular rate and rythm. S1 & S2 audible, no murmurs. Resp: no apparent increased work of breathing, some crackles over his  lower lung fields, good air movement GI: bowel sounds normal, tenderness to palpation globally, guarding. Para site clean and dry. MSK: severe muscular mass loss in his extremities, contracture and  twitching in his right arm with fingers in variable flexion contractures Neuro: awake, alert, oriented Laboratory:  Recent Labs Lab 11/02/15 1948 11/03/15 0755 11/04/15 1500  WBC 13.7* 9.6  --   HGB 8.9* 7.5* 8.3*  HCT 29.1* 24.0* 28.6*  PLT 524* 439*  --     Recent Labs Lab 11/02/15 1948 11/03/15 0755  NA 138 141  K 3.6 3.8  CL 103 108  CO2 23 23  BUN 12 8  CREATININE 0.80 0.64  CALCIUM 8.2* 7.6*  PROT 5.8* 4.5*  BILITOT 0.5 0.7  ALKPHOS 80 69  ALT 10* 9*  AST 18 18  GLUCOSE 100* 75    Imaging/Diagnostic Tests: US Paracentesis  11/04/2015  INDICATION: Acute on chronic pancreatitis, cirrhosis, alcohol abuse, ascites. Request made for diagnostic and therapeutic paracentesis with maximum of 1 liter. EXAM: ULTRASOUND GUIDED DIAGNOSTIC AND THERAPEUTIC PARACENTESIS MEDICATIONS: None. ANESTHESIA/SEDATION: None COMPLICATIONS: None immediate. PROCEDURE: Informed written consent was obtained from the patient after a discussion of the risks, benefits and alternatives to treatment. A timeout was performed prior to the initiation of the procedure. Initial ultrasound scanning demonstrates a small to moderate amount of ascites within the right upper abdominal quadrant. The right upper abdomen was prepped and draped in the usual sterile fashion. 1% lidocaine was used for local anesthesia. Following this, a Yueh catheter was introduced. An ultrasound image was saved for documentation purposes. The paracentesis was performed. The catheter was removed and a dressing was applied. The patient tolerated the procedure well without immediate post procedural complication. FINDINGS: A total of approximately 1 liter of turbid, reddish-brown fluid was removed. Samples were sent to the laboratory as requested by the clinical team. IMPRESSION: Successful ultrasound-guided diagnostic and therapeutic paracentesis yielding 1 liter of peritoneal fluid. Read by: Rowe Robert, PA-C Electronically Signed   By: Sandi Mariscal M.D.   On: 11/04/2015 12:28    Mercy Riding, MD 11/05/2015, 7:24 AM PGY-1, [place] Intern pager: [phone], text pages welcome

## 2015-11-06 ENCOUNTER — Emergency Department (HOSPITAL_COMMUNITY)
Admission: EM | Admit: 2015-11-06 | Discharge: 2015-11-07 | Disposition: A | Discharge status: Home / Self Care | Payer: Medicare Other | Attending: Emergency Medicine | Admitting: Emergency Medicine

## 2015-11-06 ENCOUNTER — Encounter (HOSPITAL_COMMUNITY): Payer: Self-pay

## 2015-11-06 DIAGNOSIS — E119 Type 2 diabetes mellitus without complications: Secondary | ICD-10-CM | POA: Insufficient documentation

## 2015-11-06 DIAGNOSIS — F17210 Nicotine dependence, cigarettes, uncomplicated: Secondary | ICD-10-CM | POA: Diagnosis not present

## 2015-11-06 DIAGNOSIS — I252 Old myocardial infarction: Secondary | ICD-10-CM | POA: Diagnosis not present

## 2015-11-06 DIAGNOSIS — G8929 Other chronic pain: Secondary | ICD-10-CM | POA: Insufficient documentation

## 2015-11-06 DIAGNOSIS — R1084 Generalized abdominal pain: Secondary | ICD-10-CM | POA: Diagnosis not present

## 2015-11-06 DIAGNOSIS — D649 Anemia, unspecified: Secondary | ICD-10-CM | POA: Insufficient documentation

## 2015-11-06 DIAGNOSIS — F329 Major depressive disorder, single episode, unspecified: Secondary | ICD-10-CM | POA: Insufficient documentation

## 2015-11-06 DIAGNOSIS — Z8673 Personal history of transient ischemic attack (TIA), and cerebral infarction without residual deficits: Secondary | ICD-10-CM | POA: Insufficient documentation

## 2015-11-06 DIAGNOSIS — Z8719 Personal history of other diseases of the digestive system: Secondary | ICD-10-CM | POA: Insufficient documentation

## 2015-11-06 DIAGNOSIS — Z8701 Personal history of pneumonia (recurrent): Secondary | ICD-10-CM | POA: Insufficient documentation

## 2015-11-06 DIAGNOSIS — I10 Essential (primary) hypertension: Secondary | ICD-10-CM | POA: Insufficient documentation

## 2015-11-06 DIAGNOSIS — F419 Anxiety disorder, unspecified: Secondary | ICD-10-CM | POA: Insufficient documentation

## 2015-11-06 DIAGNOSIS — Z8739 Personal history of other diseases of the musculoskeletal system and connective tissue: Secondary | ICD-10-CM | POA: Insufficient documentation

## 2015-11-06 DIAGNOSIS — J449 Chronic obstructive pulmonary disease, unspecified: Secondary | ICD-10-CM | POA: Diagnosis not present

## 2015-11-06 DIAGNOSIS — Z79899 Other long term (current) drug therapy: Secondary | ICD-10-CM | POA: Insufficient documentation

## 2015-11-06 LAB — BASIC METABOLIC PANEL
Anion gap: 10 (ref 5–15)
BUN: <5 mg/dL — ABNORMAL LOW (ref 6–20)
CO2: 28 mmol/L (ref 22–32)
Calcium: 8 mg/dL — ABNORMAL LOW (ref 8.9–10.3)
Chloride: 102 mmol/L (ref 101–111)
Creatinine, Ser: 0.61 mg/dL (ref 0.61–1.24)
GFR calc Af Amer: >60 mL/min (ref >60)
GFR calc non Af Amer: >60 mL/min (ref >60)
Glucose, Bld: 149 mg/dL — ABNORMAL HIGH (ref 65–99)
Potassium: 3.5 mmol/L (ref 3.5–5.1)
Sodium: 140 mmol/L (ref 135–145)

## 2015-11-06 LAB — COMPREHENSIVE METABOLIC PANEL
ALT: 9 U/L — ABNORMAL LOW (ref 17–63)
AST: 24 U/L (ref 15–41)
Albumin: 2.1 g/dL — ABNORMAL LOW (ref 3.5–5.0)
Alkaline Phosphatase: 92 U/L (ref 38–126)
Anion gap: 9 (ref 5–15)
BUN: 7 mg/dL (ref 6–20)
CO2: 29 mmol/L (ref 22–32)
Calcium: 8.1 mg/dL — ABNORMAL LOW (ref 8.9–10.3)
Chloride: 102 mmol/L (ref 101–111)
Creatinine, Ser: 0.67 mg/dL (ref 0.61–1.24)
GFR calc Af Amer: >60 mL/min (ref >60)
GFR calc non Af Amer: >60 mL/min (ref >60)
Glucose, Bld: 118 mg/dL — ABNORMAL HIGH (ref 65–99)
Potassium: 3.8 mmol/L (ref 3.5–5.1)
Sodium: 140 mmol/L (ref 135–145)
Total Bilirubin: 0.5 mg/dL (ref 0.3–1.2)
Total Protein: 5.8 g/dL — ABNORMAL LOW (ref 6.5–8.1)

## 2015-11-06 LAB — GLUCOSE, CAPILLARY
Glucose-Capillary: 103 mg/dL — ABNORMAL HIGH (ref 65–99)
Glucose-Capillary: 130 mg/dL — ABNORMAL HIGH (ref 65–99)
Glucose-Capillary: 131 mg/dL — ABNORMAL HIGH (ref 65–99)

## 2015-11-06 LAB — CBC WITH DIFFERENTIAL/PLATELET
Basophils Absolute: 0 10*3/uL (ref 0.0–0.1)
Basophils Relative: 0 %
Eosinophils Absolute: 0.2 10*3/uL (ref 0.0–0.7)
Eosinophils Relative: 1 %
HCT: 30.5 % — ABNORMAL LOW (ref 39.0–52.0)
Hemoglobin: 9.4 g/dL — ABNORMAL LOW (ref 13.0–17.0)
Lymphocytes Relative: 10 %
Lymphs Abs: 1.7 10*3/uL (ref 0.7–4.0)
MCH: 28.2 pg (ref 26.0–34.0)
MCHC: 30.8 g/dL (ref 30.0–36.0)
MCV: 91.6 fL (ref 78.0–100.0)
Monocytes Absolute: 1.1 10*3/uL — ABNORMAL HIGH (ref 0.1–1.0)
Monocytes Relative: 7 %
Neutro Abs: 13.5 10*3/uL — ABNORMAL HIGH (ref 1.7–7.7)
Neutrophils Relative %: 82 %
Platelets: 384 10*3/uL (ref 150–400)
RBC: 3.33 MIL/uL — ABNORMAL LOW (ref 4.22–5.81)
RDW: 17.5 % — ABNORMAL HIGH (ref 11.5–15.5)
WBC: 16.4 10*3/uL — ABNORMAL HIGH (ref 4.0–10.5)

## 2015-11-06 LAB — LIPASE, BLOOD: Lipase: 171 U/L — ABNORMAL HIGH (ref 11–51)

## 2015-11-06 MED ORDER — SPIRONOLACTONE 100 MG PO TABS: 100.0000 mg | ORAL_TABLET | Freq: Every day | ORAL | Status: DC

## 2015-11-06 MED ORDER — PROMETHAZINE HCL 25 MG/ML IJ SOLN
12.5000 mg | Freq: Once | INTRAMUSCULAR | Status: AC
  Administered 2015-11-06: 12.5 mg via INTRAVENOUS
  Filled 2015-11-06: qty 1

## 2015-11-06 MED ORDER — CIPROFLOXACIN HCL 500 MG PO TABS: 500.0000 mg | ORAL_TABLET | Freq: Every day | ORAL | Status: DC

## 2015-11-06 MED ORDER — PROMETHAZINE HCL 12.5 MG PO TABS: 12.5000 mg | ORAL_TABLET | Freq: Four times a day (QID) | ORAL | Status: DC | PRN

## 2015-11-06 MED ORDER — OXYCODONE HCL 5 MG PO TABS: 5.0000 mg | ORAL_TABLET | ORAL | Status: DC | PRN

## 2015-11-06 MED ORDER — PANCRELIPASE (LIP-PROT-AMYL) 12000-38000 UNITS PO CPEP: 12000.0000 [IU] | ORAL_CAPSULE | Freq: Three times a day (TID) | ORAL | Status: DC

## 2015-11-06 MED ORDER — SODIUM CHLORIDE 0.9 % IV BOLUS (SEPSIS)
500.0000 mL | Freq: Once | INTRAVENOUS | Status: AC
  Administered 2015-11-06: 500 mL via INTRAVENOUS

## 2015-11-06 MED ORDER — HYDROMORPHONE HCL 1 MG/ML IJ SOLN
1.0000 mg | Freq: Once | INTRAMUSCULAR | Status: AC
  Administered 2015-11-06: 1 mg via INTRAVENOUS
  Filled 2015-11-06: qty 1

## 2015-11-06 MED ORDER — SODIUM CHLORIDE 0.9 % IV BOLUS (SEPSIS)
250.0000 mL | Freq: Once | INTRAVENOUS | Status: AC
  Administered 2015-11-06: 250 mL via INTRAVENOUS

## 2015-11-06 MED ORDER — FUROSEMIDE 40 MG PO TABS: 40.0000 mg | ORAL_TABLET | Freq: Every day | ORAL | Status: DC

## 2015-11-06 MED ORDER — AMOXICILLIN-POT CLAVULANATE 875-125 MG PO TABS: 1.0000 | ORAL_TABLET | Freq: Two times a day (BID) | ORAL | Status: DC

## 2015-11-06 NOTE — Progress Notes (Signed)
Spoke with MD Juanito Doom stated it was okay to give PO lasix and will recheck labs.

## 2015-11-06 NOTE — Progress Notes (Signed)
Family Medicine Teaching Service Daily Progress Note Intern Pager: [phone]  Patient name: Steven Frey. Medical record number: [id number] Date of birth: 07-21-1956 Age: 60 y.o. Gender: male  Primary Care Provider: Chrisandra Netters, MD Consultants: none Code Status: full  Pt Overview and Major Events to Date:  1/23-admitted with acute pancreatitis  Assessment and Plan: Hensley Treat. is a 60 y.o. male presenting with acute on chronic pancreatitis . PMH is significant for chronic pancreatitis, alcohol use d/o, cirrhosis, hypertension, CVA, CAD, seizure d/o, anemia and depression.  Acute on chronic pancreatitis, likely secondary to alcohol abuse: improving. Pain 2/10 today. Tolerating by mouth so far. Denies emesis or diarrhea. -On Oxycodone 54m q4h as needed -Advancing diet. -Holding home statin -Lipid panel low cholesterol &TG suggestive for poor absorption. Started creon on 01/25. [facility]  Seizure disorder: stable. He is on Keppra 1000 mg twice a day at home.  -transition to by mouth keppra today  H/o CVA, MI in 2011: stable. reports that this is secondary his heart attack and pancreatitis in 2011. No history of CAD in his chart. Last stress test in 2014 and last Echo in 2016 within normal. 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 Child-Pugh Classification Grade A (100% > 85% survival 1-2 years), MELD Score 9 (3 mo mortality 1.9%, LOW). Asitic fluid on abdominal CT. No transaminitis or elevated alk phos this admission. Para on 1/25, and took off 1L. SAAG 0.6 suggestive for exudation. Pancreatitis could contribute to this. [id number] concerning for SBP. Fluid culture negative. -Ceftriaxone while in patient -Discharge on Augmentin to  complete 5 days course -Cipro for prophylaxisi -Lasix 40 mg daily by mouth  -Add spironolactone -CIWA 0>0  Hypokalemia: k 3.1 -replacing  Anemia, chronic normocytic: stable Hgb 8.3 (8.9 on admission). Hgb 13 about a year ago. Last colonoscopy a year ago with tubular non villous polyp. Vit B12 within normal. Iron panel with low Fe, TIBC and high Ferritin. Retic index 2 borderline for hyperproliferation likely secondory to his mal-nutrition. -f/u cbc -f/u nutrition consult  Hypertension: normotensive here. On amlodipine 5 mg anc coreg 3.125 mg twice a day  -hold home meds for now  Protein-Calorie malnutrition, Severe: 40 lbs weight loss in one year. Likely from pancreatitis, chronic alcohol abuse, poor appetite. Can't rule out malignancy -patient meets his calorie need per nutrition.  Depression: stable. On zoloft at home -continue home zoloft  FEN/GI: -Advance diet as tolerated -KVO  Prophylaxis: Lovenox SQ.   Disposition: home today  Subjective:  Reports 2/10 pain this morning. Ate about half of his dinner and most of his breakfast. Denies N/V/D  Objective: Temp:  [98.2 F (36.8 C)-98.6 F (37 C)] 98.6 F (37 C) (01/26 2051) Pulse Rate:  [82-121] 121 (01/26 2051) Resp:  [16] 16 (01/26 2051) BP: (100-111)/(65-77) 100/65 mmHg (01/26 2051) SpO2:  [96 %-99 %] 96 % (01/26 2051) Physical Exam: Gen: appears malnourished, able to sit in bed for exam Oropharynx: moist MM CV: regular rate and rythm. S1 & S2 audible, no murmurs. Resp: no apparent increased work of breathing, some crackles over his lower lung fields, good air movement GI: bowel sounds normal, tenderness to palpation globally, guarding. Para site clean and dry. MSK: severe muscular mass loss in his extremities, contracture and twitching  in his right arm with fingers Neuro: awake, alert, oriented Laboratory:  Recent Labs Lab 11/02/15 1948 11/03/15 0755 11/04/15 1500  WBC 13.7* 9.6  --   HGB 8.9* 7.5* 8.3*   HCT 29.1* 24.0* 28.6*  PLT 524* 439*  --     Recent Labs Lab 11/02/15 1948 11/03/15 0755 11/05/15 0611 11/05/15 1202  NA 138 141 143 139  K 3.6 3.8 5.6* 3.1*  CL 103 108 116* 109  CO2 23 23 17* 24  BUN 12 8 <5* <5*  CREATININE 0.80 0.64 0.70 0.55*  CALCIUM 8.2* 7.6* 7.9* 7.5*  PROT 5.8* 4.5*  --   --   BILITOT 0.5 0.7  --   --   ALKPHOS 80 69  --   --   ALT 10* 9*  --   --   AST 18 18  --   --   GLUCOSE 100* 75 78 92    Imaging/Diagnostic Tests: No results found.  Mercy Riding, MD 11/06/2015, 7:29 AM PGY-1, [place] Intern pager: [phone], text pages welcome

## 2015-11-06 NOTE — Plan of Care (Signed)
Problem: Food- and Nutrition-Related Knowledge Deficit (NB-1.1) Goal: Nutrition education Formal process to instruct or train a patient/client in a skill or to impart knowledge to help patients/clients voluntarily manage or modify food choices and eating behavior to maintain or improve health. Outcome: Adequate for Discharge Nutrition Education Note  RD consulted for nutrition education regarding low sodium diet.  RD provided "Low Sodium Nutrition Therapy" handout from the Academy of [employer]etics. Reviewed patient's dietary recall. Provided examples on ways to decrease sodium intake in diet. Discouraged intake of processed foods and use of salt shaker. Encouraged fresh fruits and vegetables as well as whole grain sources of carbohydrates to maximize fiber intake.   RD discussed why it is important for patient to adhere to diet recommendations, and emphasized foods to avoidTeach back method used.  Expect fair compliance.  Estimated body mass index is 20.08 kg/(m^2) as calculated from the following:   Height as of an earlier encounter on 11/02/15: 5\' 8"  (1.727 m).   Weight as of an earlier encounter on 11/02/15: 132 lb (59.875 kg). Pt meets criteria for normal weight range based on current BMI.  Current diet order is 2 grams sodium, patient is consuming approximately 50% of meals at this time. Labs and medications reviewed.   RD will continue to follow.   Steven Frey A. Jimmye Norman, RD, LDN, CDE Pager: [phone] After hours Pager: [phone]

## 2015-11-06 NOTE — Progress Notes (Signed)
Physical Therapy Treatment Patient Details Name: Steven Frey. MRN: [id number] DOB: 05-28-1956 Today's Date: 11/06/2015    History of Present Illness 60 yo M with history of alcoholic cirrhosis, current ETOH abuse, and previous recurrent pancreatitis (last episode in 2011, underwent pancreatic duct stenting at [facility]) presenting with 1 month of abdominal pain, poor PO intake, and weight loss. 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 Found to have acute on chronic pancreatitis    PT Comments    Patient with significant changes in balance deficits with pt requiring max A to maintain standing balance. RN aware of changes in mobility level. Due to pt's current mobility level and possible minimal assistance at home recommending SNF for further skilled PT services for increased independence and safety with mobility.   Follow Up Recommendations  Supervision for mobility/OOB;SNF     Equipment Recommendations  None recommended by PT;Other (comment) (patient reports having SPC at home)    Recommendations for Other Services       Precautions / Restrictions Precautions Precautions: Fall Precaution Comments: reports 2 falls over last 6 months Restrictions Weight Bearing Restrictions: No    Mobility  Bed Mobility Overal bed mobility: Needs Assistance Bed Mobility: Supine to Sit     Supine to sit: Min assist     General bed mobility comments: min A to elevate trunk into sitting and bring bilat hips toward EOB with use of bedpad  Transfers Overall transfer level: Needs assistance Equipment used: 1 person hand held assist Transfers: Sit to/from [facility] Sit to Stand: Mod assist;Max assist Stand pivot transfers: Max assist       General transfer comment: mod A for anterior translation to come into standing; max A to maintain balance for standing and vc for WS to L with heavy  R lateral lean; max A with vc for sequencing to stand pivot EOB to recliner chair; pt reported feeling dizzy throughout ; attempted stand with use of R platform walker but pt with difficulty WBing through R UE and had no improvement in balance  Ambulation/Gait             General Gait Details: deferred due to patient/therapist safety at this time; pt unable to maintian balance without max A   Stairs            Wheelchair Mobility    Modified Rankin (Stroke Patients Only)       Balance   Sitting-balance support: Single extremity supported;Feet supported Sitting balance-Leahy Scale: Poor     Standing balance support: Single extremity supported Standing balance-Leahy Scale: Zero                      Cognition Arousal/Alertness: Awake/alert Behavior During Therapy: WFL for tasks assessed/performed Overall Cognitive Status: No family/caregiver present to determine baseline cognitive functioning                      Exercises      General Comments General comments (skin integrity, edema, etc.): spoke with RN about pt's current moobility level and significant changes in balance from previous PT session      Pertinent Vitals/Pain Pain Assessment: Faces Faces Pain Scale: Hurts little more Pain Location: abdomen and R UE Pain Descriptors / Indicators: Grimacing;Sore Pain Intervention(s): Limited activity within patient's tolerance;Monitored during session;Premedicated before session;Repositioned    Home Living  Prior Function            PT Goals (current goals can now be found in the care plan section) Acute Rehab PT Goals Patient Stated Goal: none stated PT Goal Formulation: With patient Time For Goal Achievement: 11/18/15 Potential to Achieve Goals: Good Progress towards PT goals: Not progressing toward goals - comment (balance deficits increased)    Frequency  Min 3X/week    PT Plan Discharge plan needs  to be updated    Co-evaluation             End of Session Equipment Utilized During Treatment: Gait belt Activity Tolerance: Other (comment) (pt reported dizziness; limited by balance deficits) Patient left: in bed;with call bell/phone within reach;with bed alarm set     Time: [id number] PT Time Calculation (min) (ACUTE ONLY): 34 min  Charges:  $Therapeutic Activity: 23-37 mins                    G Codes:      [place] April, PTA Pager: [phone]   11/06/2015, 12:32 PM

## 2015-11-06 NOTE — Care Management Note (Signed)
Case Management Note  Patient Details  Name: Steven Frey. MRN: [id number] Date of Birth: 10/30/1955  Subjective/Objective:     60 yr old gentleman admitted with pancreatitis.                Action/Plan: Case manager spoke with patient concerning need for home health at discharge. Choice was offered. Referral was called to Rhett Bannister, [facility] Liaison. Patient said he will have assistance at discharge. 11/06/15   Expected Discharge Date:   11/06/15               Expected Discharge Plan:  [name]  In-House Referral:     Discharge planning Services  CM Consult  Post Acute Care Choice:  Home Health Choice offered to:  Patient  DME Arranged:  N/A DME Agency:  NA  HH Arranged:  PT, OT HH Agency:  [facility]  Status of Service:  Completed, signed off  Medicare Important Message Given:  Yes Date Medicare IM Given:    Medicare IM give by:    Date Additional Medicare IM Given:    Additional Medicare Important Message give by:     If discussed at [place] of Stay Meetings, dates discussed:    Additional Comments:  Ninfa Meeker, RN 11/06/2015, 2:26 PM

## 2015-11-06 NOTE — ED Notes (Signed)
Patient transported by [facility] from home.  EMS reports patient was dc'ed from [facility] today after being admitted 4days ago for pancreatitis.  Patient reported to EMS he did not get the relief he needed prior to being dc'ed and immediately called EMS upon arriving home.  EMS reports patient's house is uninhabitable.

## 2015-11-06 NOTE — ED Provider Notes (Signed)
CSN: [id number]     Arrival date & time 11/06/15  1859 History   First MD Initiated Contact with Patient 11/06/15 1917     Chief Complaint  Patient presents with  . Abdominal Pain     (Consider location/radiation/quality/duration/timing/severity/associated sxs/prior Treatment) HPI   60 year old male with abdominal pain. He was discharged from the hospital just recently with pancreatitis. He reports increasing symptoms since then. He denies heavy food intake or alcohol use since then.The patient is having is consistent with the pain he was having during his hospitalization but states that it is simply worse to left.He has not filled his prescriptions yet. He denies any fevers or chills. Has had some nausea, but no vomiting.  Past Medical History  Diagnosis Date  . Angioedema     rx requiring intubation/vent support suspected secondary to ACE1 (but also on zithromax) 5/09 hospitalization   . Smoker   . ETOH abuse   . Chronic pancreatitis ([place])     (10/08 hops). admx 02/2010 pseudocyst aspirated during admxn. pancreatic dual stent placement at [facility] 11/11  . Cirrhosis ([place]) 2011    due to ETOH with recent hepatic abcess and possible HCC.   . Gastritis     alcohol induced  . Depression     no hx of meds  . VF (ventricular fibrillation) ([place]) 2011    arrest 6/11. successfully rescucitated w/prolinged hospitalization due to respitatory failure. QT prolongation during cooling, now resolved  . History of stroke 2011    reports it was caused by pain from pancreatitis- caused heart to stop and stroke   . 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 Anxiety   . Edema   . 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 Anoxic brain damage ([facility])   . Anemia   . Hypertension 2011  . Arthritis   . Chronic pain 2011  . Hyperlipidemia 2011  . Inguinal hernia 2011  . COPD (chronic obstructive pulmonary disease) ([place])   . GERD (gastroesophageal reflux disease)   . Shortness of  breath     exertion  . Pneumonia     hx of  . Stroke [facility])     right sided weakness; affected long term memory  . Diabetes mellitus without complication ([place])     Type 2, pt reports no longer takes meds & A1C was normal.  . Myocardial infarction ([place])   . Seizures ([place]) 2011    had seizure 11-24-14/went to ED per pt.    Past Surgical History  Procedure Laterality Date  . Right wrist orif  1976    for fx  . Pancreatic stent placement    . Inguinal hernia repair  1960    right  . Inguinal hernia repair Left 07/12/2013    Procedure: HERNIA REPAIR INGUINAL ADULT;  Surgeon: Adin Hector, MD;  Location: [place];  Service: General;  Laterality: Left;  . Insertion of mesh Left 07/12/2013    Procedure: INSERTION OF MESH;  Surgeon: Adin Hector, MD;  Location: [place];  Service: General;  Laterality: Left;   Family History  Problem Relation Age of Onset  . Coronary artery disease Neg Hx   . Colon cancer Neg Hx   . Alcohol abuse Mother   . Alcohol abuse Father   . Alcohol abuse    . Early death Mother   . Diabetes Sister   . Hyperlipidemia Sister   . Hypertension Sister   . Prostate cancer Father 90  . Breast  cancer Paternal Grandmother    Social History  Substance Use Topics  . Smoking status: Current Every Day Smoker -- 0.25 packs/day    Types: Cigarettes  . Smokeless tobacco: Never Used  . Alcohol Use: 0.0 oz/week    0 Standard drinks or equivalent per week     Comment: hx of alcohol abuse, every 2-3 weeks beer on and off     Review of Systems  All systems reviewed and negative, other than as noted in HPI.   Allergies  Azithromycin and Lisinopril  Home Medications   Prior to Admission medications   Medication Sig Start Date End Date Taking? Authorizing Provider  amLODipine (NORVASC) 5 MG tablet TAKE 1 TABLET (5 MG TOTAL) BY MOUTH DAILY. 09/30/15   Leeanne Rio, MD  amoxicillin-clavulanate (AUGMENTIN) 875-125 MG tablet Take 1 tablet by mouth 2 (two) times  daily. Starting 11/07/2015 11/06/15   Mercy Riding, MD  carvedilol (COREG) 3.125 MG tablet TAKE 1 TABLET BY MOUTH 2 TIMES DAILY WITH A MEAL. 12/25/14   Leeanne Rio, MD  ciprofloxacin (CIPRO) 500 MG tablet Take 1 tablet (500 mg total) by mouth daily. Starting 11/10/2015 11/06/15   Mercy Riding, MD  [facility] B-12 1000 MCG TBCR TAKE 1 TABLET BY MOUTH DAILY    Leeanne Rio, MD  folic acid (FOLVITE) 1 MG tablet TAKE 1 TABLET BY MOUTH DAILY    Leeanne Rio, MD  furosemide (LASIX) 40 MG tablet Take 1 tablet (40 mg total) by mouth daily. 11/06/15   Mercy Riding, MD  levETIRAcetam (KEPPRA) 1000 MG tablet TAKE 1 TABLET BY MOUTH 2 TIMES DAILY. 09/30/15   Leeanne Rio, MD  lipase/protease/amylase (CREON) 12000 units CPEP capsule Take 1 capsule (12,000 Units total) by mouth 3 (three) times daily before meals. 11/06/15   Mercy Riding, MD  oxyCODONE (OXY IR/ROXICODONE) 5 MG immediate release tablet Take 1 tablet (5 mg total) by mouth every 4 (four) hours as needed for moderate pain or severe pain. 11/06/15   Mercy Riding, MD  polyethylene glycol powder (GLYCOLAX/MIRALAX) powder Take 17 g by mouth daily as needed. 10/20/14   Leeanne Rio, MD  promethazine (PHENERGAN) 12.5 MG tablet Take 1 tablet (12.5 mg total) by mouth every 6 (six) hours as needed for nausea or vomiting. 11/06/15   Mercy Riding, MD  sertraline (ZOLOFT) 100 MG tablet TAKE 1 TABLET EVERY DAY 09/07/15   Leeanne Rio, MD  spironolactone (ALDACTONE) 100 MG tablet Take 1 tablet (100 mg total) by mouth daily. 11/06/15   Mercy Riding, MD  thiamine (VITAMIN B-1) 100 MG tablet Take 1 tablet (100 mg total) by mouth daily. 12/25/12   Jacquelyn A McGill, MD   BP 126/85 mmHg  Pulse 100  Temp(Src) 97.9 F (36.6 C) (Oral)  Resp 18  SpO2 94% Physical Exam  Constitutional: He appears well-developed and well-nourished. No distress.  HENT:  Head: Normocephalic and atraumatic.  Eyes: Conjunctivae are normal. Right eye exhibits no  discharge. Left eye exhibits no discharge.  Neck: Neck supple.  Cardiovascular: Normal rate, regular rhythm and normal heart sounds.  Exam reveals no gallop and no friction rub.   No murmur heard. Pulmonary/Chest: Effort normal and breath sounds normal. No respiratory distress.  Abdominal: Soft. He exhibits no distension. There is tenderness. There is no rebound.  mild epigastric tenderness without rebound or guarding. No distention.  Musculoskeletal: He exhibits no edema or tenderness.  Neurological: He is alert.  Skin:  Skin is warm and dry.  Psychiatric: He has a normal mood and affect. His behavior is normal. Thought content normal.  Nursing note and vitals reviewed.   ED Course  Procedures (including critical care time) Labs Review Labs Reviewed  CBC WITH DIFFERENTIAL/PLATELET - Abnormal; Notable for the following:    WBC 16.4 (*)    RBC 3.33 (*)    Hemoglobin 9.4 (*)    HCT 30.5 (*)    RDW 17.5 (*)    Neutro Abs 13.5 (*)    Monocytes Absolute 1.1 (*)    All other components within normal limits  COMPREHENSIVE METABOLIC PANEL - Abnormal; Notable for the following:    Glucose, Bld 118 (*)    Calcium 8.1 (*)    Total Protein 5.8 (*)    Albumin 2.1 (*)    ALT 9 (*)    All other components within normal limits  LIPASE, BLOOD - Abnormal; Notable for the following:    Lipase 171 (*)    All other components within normal limits    Imaging Review No results found. I have personally reviewed and evaluated these images and lab results as part of my medical decision-making.   EKG Interpretation None      MDM   Final diagnoses:  None    59yM with abdominal pain. Just discharged from hospital with pancreatitis. Likely continued symptoms of this. Lipase trending down. Reports that pain "just about gone" after a single dose of pain medication. Has prescriptions still with him. When discussing discharge, family now expressing concerns that too weak to take care of him at  home. Hx of CVA and walks with cane at baseline. Reports that cannot get around without someone "carrying him." Suspect that a lot of this is deconditioning.No emergent need for rehospitalization.Case management to review case with patient/family.    Virgel Manifold, MD 11/11/15 1213

## 2015-11-06 NOTE — ED Notes (Signed)
Bed: [id number] Expected date:  Expected time:  Means of arrival:  Comments: EMS-PANCREATITIS

## 2015-11-06 NOTE — Discharge Instructions (Signed)
It has been a pleasure taking care of you! You were admitted due to nausea, vomiting, diarrhea and abdominal pain likely due to pancreatitis (see below). You also have ascitis which is fluid collection in your belly secondary to liver cirrhosis (see below). We gave you medication for pain and infection. As a result, your symptoms improve to the extent we feel you are safe to go home. We are discharging you on more of these medication and other additional medications that you need to continue taking after you leave the hospital. We may have also made some adjustments to your other medications. Please, make sure to read the directions before you take them. The names and directions on how to take these medications are found on this discharge paper under medication section.  Creon is an expensive medication if your don't have prescription coverage. There are alternatives products (Lipo-gold or Mega-zyme) at [facility] that you can try. Their price ranges from $20 to $50 dollars.   You also need a follow up with your primary care doctor. The address, date and time are found on the discharge paper under follow up section.  Below, you can find some reading about pancreatitis and ascitis  Take care,  Acute Pancreatitis Acute pancreatitis is a disease in which the pancreas becomes suddenly irritated (inflamed). The pancreas is a large gland behind your stomach. The pancreas makes enzymes that help digest food. The pancreas also makes 2 hormones that help control your blood sugar. Acute pancreatitis happens when the enzymes attack and damage the pancreas. Most attacks last a couple of days and can cause serious problems. HOME CARE  Follow your doctor's diet instructions. You may need to avoid alcohol and limit fat in your diet.  Eat small meals often.  Drink enough fluids to keep your pee (urine) clear or pale yellow.  Only take medicines as told by your doctor.  Avoid drinking alcohol if it caused your  disease.  Do not smoke.  Get plenty of rest.  Check your blood sugar at home as told by your doctor.  Keep all doctor visits as told. GET HELP IF:  You do not get better as quickly as expected.  You have new or worsening symptoms.  You have lasting pain, weakness, or feel sick to your stomach (nauseous).  You get better and then have another pain attack. GET HELP RIGHT AWAY IF:   You are unable to eat or keep fluids down.  Your pain becomes severe.  You have a fever or lasting symptoms for more than 2 to 3 days.  You have a fever and your symptoms suddenly get worse.  Your skin or the white part of your eyes turn yellow (jaundice).  You throw up (vomit).  You feel dizzy, or you pass out (faint).  Your blood sugar is high (over 300 mg/dL). MAKE SURE YOU:   Understand these instructions.  Will watch your condition.  Will get help right away if you are not doing well or get worse.   This information is not intended to replace advice given to you by your health care provider. Make sure you discuss any questions you have with your health care provider.   Document Released: 03/14/2008 Document Revised: 10/17/2014 Document Reviewed: 01/05/2012 Elsevier Interactive Patient Education 2016 Elsevier Inc.   Ascites Ascites is a collection of excess fluid in the abdomen. Ascites can range from mild to severe. It can get worse without treatment. CAUSES Possible causes include:  Cirrhosis. This is the most  common cause of ascites.  Infection or inflammation in the abdomen.  Cancer in the abdomen.  Heart failure.  Kidney disease.  Inflammation of the pancreas.  Clots in the veins of the liver. SIGNS AND SYMPTOMS Signs and symptoms may include:  A feeling of fullness in your abdomen. This is common.  An increase in the size of your abdomen or your waist.  Swelling in your legs.  Swelling of the scrotum in men.  Difficulty breathing.  Abdominal  pain.  Sudden weight gain. If the condition is mild, you may not have symptoms. DIAGNOSIS To make a diagnosis, your health care provider will:  Ask about your medical history.  Perform a physical exam.  Order imaging tests, such as an ultrasound or CT scan of your abdomen. TREATMENT Treatment depends on the cause of the ascites. It may include:  Taking a pill to make you urinate. This is called a water pill (diuretic pill).  Strictly reducing your salt (sodium) intake. Salt can cause extra fluid to be kept in the body, and this makes ascites worse.  Having a procedure to remove fluid from your abdomen (paracentesis).  Having a procedure to transfer fluid from your abdomen into a vein.  Having a procedure that connects two of the major veins within your liver and relieves pressure on your liver (TIPS procedure). Ascites may go away or improve with treatment of the condition that caused it.  HOME CARE INSTRUCTIONS  Keep track of your weight. To do this, weigh yourself at the same time every day and record your weight.  Keep track of how much you drink and any changes in the amount you urinate.  Follow any instructions that your health care provider gives you about how much to drink.  Try not to eat salty (high-sodium) foods.  Take medicines only as directed by your health care provider.  Keep all follow-up visits as directed by your health care provider. This is important.  Report any changes in your health to your health care provider, especially if you develop new symptoms or your symptoms get worse. SEEK MEDICAL CARE IF:  Your gain more than 3 pounds in 3 days.  Your abdominal size or your waist size increases.  You have new swelling in your legs.  The swelling in your legs gets worse. SEEK IMMEDIATE MEDICAL CARE IF:  You develop a fever.  You develop confusion.  You develop new or worsening difficulty breathing.  You develop new or worsening abdominal  pain.  You develop new or worsening swelling in the scrotum (in men).   This information is not intended to replace advice given to you by your health care provider. Make sure you discuss any questions you have with your health care provider.   Document Released: 09/26/2005 Document Revised: 10/17/2014 Document Reviewed: 04/25/2014 Elsevier Interactive Patient Education [employer].

## 2015-11-07 DIAGNOSIS — R109 Unspecified abdominal pain: Secondary | ICD-10-CM | POA: Diagnosis not present

## 2015-11-07 NOTE — Discharge Instructions (Signed)

## 2015-11-07 NOTE — ED Notes (Signed)
[facility] here to transport pt back to home (sister's place).

## 2015-11-07 NOTE — Care Management Note (Signed)
Case Management Note  Patient Details  Name: Steven Frey. MRN: [id number] Date of Birth: [birth year]  Subjective/Objective:  :Patient discharged from [facility] today.  Patient presents to ED after not "Having his needs met"  While at [facility] and house is inhabitable per EMS.                    Action/Plan:  EDCM added home health RN, aide and social worker, consulted [place].  Home health oreders placed by EDP.  Patient to stay at sister [name]'s house [address] [place] [phone].  [phone] or [phone].  No further EDCM needs at this time.   Expected Discharge Date:                  Expected Discharge Plan:  [place]  In-House Referral:     Discharge planning Services  CM Consult  Post Acute Care Choice:  Home Health Choice offered to:  Patient, Sibling  DME Arranged:    DME Agency:     HH Arranged:  RN, Social Work, Nurse's Aide [place] Agency:  [facility]  Status of Service:  Completed, signed off  Medicare Important Message Given:    Date Medicare IM Given:    Medicare IM give by:    Date Additional Medicare IM Given:    Additional Medicare Important Message give by:     If discussed at [place] of Stay Meetings, dates discussed:    Additional CommentsLivia Snellen, RN 11/07/2015, 12:18 AM

## 2015-11-07 NOTE — ED Notes (Signed)
[facility] was called for pt's transportation.

## 2015-11-07 NOTE — ED Notes (Signed)
Pt's being discharged home to his sister's place: [address],  [place]  [place] [address]

## 2015-11-09 ENCOUNTER — Telehealth: Payer: Self-pay | Admitting: Family Medicine

## 2015-11-09 ENCOUNTER — Other Ambulatory Visit: Payer: Self-pay

## 2015-11-09 LAB — CULTURE, BODY FLUID-BOTTLE

## 2015-11-09 LAB — CULTURE, BODY FLUID W GRAM STAIN -BOTTLE: Culture: NO GROWTH

## 2015-11-09 NOTE — Telephone Encounter (Signed)
This is fine. Red team, please call & give verbal orders  Be well, Dr. Ardelia Mems

## 2015-11-09 NOTE — Telephone Encounter (Signed)
Spoke with April, verbal orders given.

## 2015-11-09 NOTE — Telephone Encounter (Signed)
April called and would like to extend her PT orders to, do PT 3 times for 3 weeks and then 2 times for 5 weeks. She would also like to have OT eval for the patient. Please call April with the verbal orders. jw

## 2015-11-10 LAB — TOTAL BILIRUBIN, BODY FLUID: Total bilirubin, fluid: 2.5 mg/dL

## 2015-11-10 NOTE — Progress Notes (Signed)
[facility] called patient for follow up and spoke to patient's sister.  Patient's sister reports [facility] has come to see the patient yesterday and were, "Very good."  Patient's sister concerned that a Education officer, museum has not come to see the patient yet.  EDCM instructed patient's sister to call [facility] and ask about status of social worker.  Patient's daughter also concerned they will not be able to get patient to his doctor visit.  EDCM provided patient's sister with phone number for [facility].  Patient's sister thankful for assistance.  No further EDCM needs at this time.

## 2015-11-11 ENCOUNTER — Telehealth: Payer: Self-pay | Admitting: Family Medicine

## 2015-11-11 MED ORDER — WALKER MISC: Status: DC

## 2015-11-11 MED ORDER — COMMODE BEDSIDE MISC: Status: DC

## 2015-11-11 NOTE — Telephone Encounter (Signed)
As patient's PCP, I received the results of patient's bilirubin from his ascites, which was 2.5. This is higher than his serum bilirubin. Based on my research, this raises concern for bile leak or bowel perforation. While it seems unlikely to be the case given patient's clinical improvement in the hospital, this warrants follow up. Needs referral to GI.  I called patient to see how he's doing and discuss lab results, but instead got his sister Alita Chyle. Patient was not available. She reported he's gradually getting better each day. Ate solids yesterday for the first time since discharge. Did vomit once this morning. Pain is overall improving. He remains very weak and the family is concerned that he may need SNF stay. He has follow up appointment scheduled in our clinic tomorrow with Dr. Cyndia Skeeters.  I did not discuss the lab result specifically with Vaughan Basta. I have spoken with Dr. Cyndia Skeeters and advised him of need for having GI referral initiated during tomorrow's visit.  Leeanne Rio, MD

## 2015-11-11 NOTE — Telephone Encounter (Signed)
Received request from [facility] for hemiwalker and bedside commode. Rx printed & will fax back.  Leeanne Rio, MD

## 2015-11-12 ENCOUNTER — Encounter: Payer: Self-pay | Admitting: Student

## 2015-11-12 ENCOUNTER — Ambulatory Visit (INDEPENDENT_AMBULATORY_CARE_PROVIDER_SITE_OTHER): Payer: Medicare Other | Admitting: Student

## 2015-11-12 ENCOUNTER — Inpatient Hospital Stay (HOSPITAL_COMMUNITY)
Admission: AD | Admit: 2015-11-12 | Discharge: 2015-11-16 | DRG: 438 | Disposition: A | Discharge status: Other Acute Inpatient Hospital | Payer: Medicare Other | Source: Ambulatory Visit | Attending: Family Medicine | Admitting: Family Medicine

## 2015-11-12 ENCOUNTER — Encounter (HOSPITAL_COMMUNITY): Payer: Self-pay | Admitting: Physician Assistant

## 2015-11-12 ENCOUNTER — Inpatient Hospital Stay (HOSPITAL_COMMUNITY): Payer: Medicare Other

## 2015-11-12 VITALS — BP 91/54 | HR 117 | Temp 97.5°F | Ht 68.0 in | Wt 119.7 lb

## 2015-11-12 DIAGNOSIS — F09 Unspecified mental disorder due to known physiological condition: Secondary | ICD-10-CM | POA: Diagnosis present

## 2015-11-12 DIAGNOSIS — Z881 Allergy status to other antibiotic agents status: Secondary | ICD-10-CM

## 2015-11-12 DIAGNOSIS — G40909 Epilepsy, unspecified, not intractable, without status epilepticus: Secondary | ICD-10-CM | POA: Diagnosis not present

## 2015-11-12 DIAGNOSIS — Z681 Body mass index (BMI) 19 or less, adult: Secondary | ICD-10-CM

## 2015-11-12 DIAGNOSIS — Q453 Other congenital malformations of pancreas and pancreatic duct: Secondary | ICD-10-CM | POA: Diagnosis not present

## 2015-11-12 DIAGNOSIS — Z888 Allergy status to other drugs, medicaments and biological substances status: Secondary | ICD-10-CM | POA: Diagnosis not present

## 2015-11-12 DIAGNOSIS — K861 Other chronic pancreatitis: Secondary | ICD-10-CM | POA: Diagnosis not present

## 2015-11-12 DIAGNOSIS — J9811 Atelectasis: Secondary | ICD-10-CM | POA: Diagnosis not present

## 2015-11-12 DIAGNOSIS — G931 Anoxic brain damage, not elsewhere classified: Secondary | ICD-10-CM | POA: Diagnosis present

## 2015-11-12 DIAGNOSIS — Z811 Family history of alcohol abuse and dependence: Secondary | ICD-10-CM | POA: Diagnosis not present

## 2015-11-12 DIAGNOSIS — K7030 Alcoholic cirrhosis of liver without ascites: Secondary | ICD-10-CM | POA: Diagnosis not present

## 2015-11-12 DIAGNOSIS — F329 Major depressive disorder, single episode, unspecified: Secondary | ICD-10-CM | POA: Diagnosis present

## 2015-11-12 DIAGNOSIS — R64 Cachexia: Secondary | ICD-10-CM | POA: Diagnosis present

## 2015-11-12 DIAGNOSIS — T85638A Leakage of other specified internal prosthetic devices, implants and grafts, initial encounter: Secondary | ICD-10-CM | POA: Diagnosis present

## 2015-11-12 DIAGNOSIS — K219 Gastro-esophageal reflux disease without esophagitis: Secondary | ICD-10-CM | POA: Diagnosis present

## 2015-11-12 DIAGNOSIS — R188 Other ascites: Secondary | ICD-10-CM | POA: Diagnosis present

## 2015-11-12 DIAGNOSIS — J449 Chronic obstructive pulmonary disease, unspecified: Secondary | ICD-10-CM | POA: Diagnosis present

## 2015-11-12 DIAGNOSIS — J942 Hemothorax: Secondary | ICD-10-CM | POA: Diagnosis present

## 2015-11-12 DIAGNOSIS — Z9689 Presence of other specified functional implants: Secondary | ICD-10-CM | POA: Diagnosis present

## 2015-11-12 DIAGNOSIS — H547 Unspecified visual loss: Secondary | ICD-10-CM | POA: Diagnosis present

## 2015-11-12 DIAGNOSIS — F17210 Nicotine dependence, cigarettes, uncomplicated: Secondary | ICD-10-CM | POA: Diagnosis present

## 2015-11-12 DIAGNOSIS — I10 Essential (primary) hypertension: Secondary | ICD-10-CM | POA: Diagnosis present

## 2015-11-12 DIAGNOSIS — J90 Pleural effusion, not elsewhere classified: Secondary | ICD-10-CM | POA: Diagnosis not present

## 2015-11-12 DIAGNOSIS — K859 Acute pancreatitis without necrosis or infection, unspecified: Secondary | ICD-10-CM | POA: Diagnosis not present

## 2015-11-12 DIAGNOSIS — K7031 Alcoholic cirrhosis of liver with ascites: Secondary | ICD-10-CM | POA: Diagnosis not present

## 2015-11-12 DIAGNOSIS — E43 Unspecified severe protein-calorie malnutrition: Secondary | ICD-10-CM | POA: Diagnosis not present

## 2015-11-12 DIAGNOSIS — R1084 Generalized abdominal pain: Secondary | ICD-10-CM

## 2015-11-12 DIAGNOSIS — R109 Unspecified abdominal pain: Secondary | ICD-10-CM | POA: Diagnosis not present

## 2015-11-12 DIAGNOSIS — E785 Hyperlipidemia, unspecified: Secondary | ICD-10-CM | POA: Diagnosis present

## 2015-11-12 DIAGNOSIS — K863 Pseudocyst of pancreas: Secondary | ICD-10-CM | POA: Diagnosis present

## 2015-11-12 DIAGNOSIS — R000 Tachycardia, unspecified: Secondary | ICD-10-CM | POA: Insufficient documentation

## 2015-11-12 DIAGNOSIS — M791 Myalgia: Secondary | ICD-10-CM | POA: Diagnosis present

## 2015-11-12 DIAGNOSIS — F1020 Alcohol dependence, uncomplicated: Secondary | ICD-10-CM | POA: Diagnosis present

## 2015-11-12 DIAGNOSIS — K5909 Other constipation: Secondary | ICD-10-CM | POA: Diagnosis present

## 2015-11-12 DIAGNOSIS — D649 Anemia, unspecified: Secondary | ICD-10-CM | POA: Diagnosis present

## 2015-11-12 DIAGNOSIS — I69351 Hemiplegia and hemiparesis following cerebral infarction affecting right dominant side: Secondary | ICD-10-CM | POA: Diagnosis not present

## 2015-11-12 DIAGNOSIS — R627 Adult failure to thrive: Secondary | ICD-10-CM | POA: Diagnosis present

## 2015-11-12 DIAGNOSIS — K7460 Unspecified cirrhosis of liver: Secondary | ICD-10-CM | POA: Diagnosis not present

## 2015-11-12 DIAGNOSIS — I745 Embolism and thrombosis of iliac artery: Secondary | ICD-10-CM | POA: Diagnosis present

## 2015-11-12 DIAGNOSIS — F418 Other specified anxiety disorders: Secondary | ICD-10-CM | POA: Diagnosis present

## 2015-11-12 DIAGNOSIS — E119 Type 2 diabetes mellitus without complications: Secondary | ICD-10-CM | POA: Diagnosis present

## 2015-11-12 DIAGNOSIS — R1010 Upper abdominal pain, unspecified: Secondary | ICD-10-CM

## 2015-11-12 DIAGNOSIS — K860 Alcohol-induced chronic pancreatitis: Secondary | ICD-10-CM | POA: Diagnosis present

## 2015-11-12 DIAGNOSIS — K766 Portal hypertension: Secondary | ICD-10-CM | POA: Diagnosis present

## 2015-11-12 DIAGNOSIS — Y828 Other medical devices associated with adverse incidents: Secondary | ICD-10-CM | POA: Diagnosis present

## 2015-11-12 DIAGNOSIS — I708 Atherosclerosis of other arteries: Secondary | ICD-10-CM | POA: Diagnosis present

## 2015-11-12 DIAGNOSIS — M2450 Contracture, unspecified joint: Secondary | ICD-10-CM | POA: Diagnosis present

## 2015-11-12 DIAGNOSIS — I252 Old myocardial infarction: Secondary | ICD-10-CM | POA: Diagnosis not present

## 2015-11-12 DIAGNOSIS — Z8674 Personal history of sudden cardiac arrest: Secondary | ICD-10-CM | POA: Diagnosis not present

## 2015-11-12 DIAGNOSIS — D735 Infarction of spleen: Secondary | ICD-10-CM | POA: Diagnosis present

## 2015-11-12 DIAGNOSIS — K654 Sclerosing mesenteritis: Secondary | ICD-10-CM | POA: Diagnosis present

## 2015-11-12 DIAGNOSIS — I2510 Atherosclerotic heart disease of native coronary artery without angina pectoris: Secondary | ICD-10-CM | POA: Diagnosis present

## 2015-11-12 DIAGNOSIS — T402X5A Adverse effect of other opioids, initial encounter: Secondary | ICD-10-CM | POA: Diagnosis present

## 2015-11-12 DIAGNOSIS — E46 Unspecified protein-calorie malnutrition: Secondary | ICD-10-CM | POA: Diagnosis not present

## 2015-11-12 HISTORY — DX: Other specified anxiety disorders: F41.8

## 2015-11-12 LAB — COMPREHENSIVE METABOLIC PANEL
ALT: 10 U/L — ABNORMAL LOW (ref 17–63)
AST: 21 U/L (ref 15–41)
Albumin: 1.9 g/dL — ABNORMAL LOW (ref 3.5–5.0)
Alkaline Phosphatase: 74 U/L (ref 38–126)
Anion gap: 16 — ABNORMAL HIGH (ref 5–15)
BUN: 15 mg/dL (ref 6–20)
CO2: 22 mmol/L (ref 22–32)
Calcium: 8.8 mg/dL — ABNORMAL LOW (ref 8.9–10.3)
Chloride: 102 mmol/L (ref 101–111)
Creatinine, Ser: 1.06 mg/dL (ref 0.61–1.24)
GFR calc Af Amer: >60 mL/min (ref >60)
GFR calc non Af Amer: >60 mL/min (ref >60)
Glucose, Bld: 124 mg/dL — ABNORMAL HIGH (ref 65–99)
Potassium: 4.8 mmol/L (ref 3.5–5.1)
Sodium: 140 mmol/L (ref 135–145)
Total Bilirubin: 0.6 mg/dL (ref 0.3–1.2)
Total Protein: 6.3 g/dL — ABNORMAL LOW (ref 6.5–8.1)

## 2015-11-12 LAB — CBC
HCT: 31.1 % — ABNORMAL LOW (ref 39.0–52.0)
Hemoglobin: 9.5 g/dL — ABNORMAL LOW (ref 13.0–17.0)
MCH: 27.7 pg (ref 26.0–34.0)
MCHC: 30.5 g/dL (ref 30.0–36.0)
MCV: 90.7 fL (ref 78.0–100.0)
Platelets: 602 10*3/uL — ABNORMAL HIGH (ref 150–400)
RBC: 3.43 MIL/uL — ABNORMAL LOW (ref 4.22–5.81)
RDW: 17.7 % — ABNORMAL HIGH (ref 11.5–15.5)
WBC: 19.6 10*3/uL — ABNORMAL HIGH (ref 4.0–10.5)

## 2015-11-12 LAB — LIPASE, BLOOD: Lipase: 201 U/L — ABNORMAL HIGH (ref 11–51)

## 2015-11-12 MED ORDER — ENOXAPARIN SODIUM 40 MG/0.4ML ~~LOC~~ SOLN
40.0000 mg | SUBCUTANEOUS | Status: DC
  Administered 2015-11-12: 40 mg via SUBCUTANEOUS
  Filled 2015-11-12: qty 0.4

## 2015-11-12 MED ORDER — FUROSEMIDE 40 MG PO TABS: 40.0000 mg | ORAL_TABLET | Freq: Every day | ORAL | Status: DC

## 2015-11-12 MED ORDER — SODIUM CHLORIDE 0.9 % IV SOLN
INTRAVENOUS | Status: DC
  Administered 2015-11-12: 19:00:00 via INTRAVENOUS

## 2015-11-12 MED ORDER — THIAMINE HCL 100 MG/ML IJ SOLN
100.0000 mg | Freq: Every day | INTRAMUSCULAR | Status: DC
  Administered 2015-11-13 – 2015-11-15 (×2): 100 mg via INTRAVENOUS
  Filled 2015-11-12 (×3): qty 2

## 2015-11-12 MED ORDER — LORAZEPAM 2 MG/ML IJ SOLN: 1.0000 mg | Freq: Four times a day (QID) | INTRAMUSCULAR | Status: DC | PRN

## 2015-11-12 MED ORDER — ADULT MULTIVITAMIN W/MINERALS CH
1.0000 | ORAL_TABLET | Freq: Every day | ORAL | Status: DC
  Administered 2015-11-12 – 2015-11-16 (×4): 1 via ORAL
  Filled 2015-11-12 (×4): qty 1

## 2015-11-12 MED ORDER — FENTANYL CITRATE (PF) 100 MCG/2ML IJ SOLN
25.0000 ug | INTRAMUSCULAR | Status: DC | PRN
  Administered 2015-11-12 – 2015-11-16 (×22): 25 ug via INTRAVENOUS
  Filled 2015-11-12 (×20): qty 2

## 2015-11-12 MED ORDER — PANCRELIPASE (LIP-PROT-AMYL) 12000-38000 UNITS PO CPEP
12000.0000 [IU] | ORAL_CAPSULE | Freq: Three times a day (TID) | ORAL | Status: DC
  Administered 2015-11-13 – 2015-11-16 (×9): 12000 [IU] via ORAL
  Filled 2015-11-12 (×9): qty 1

## 2015-11-12 MED ORDER — HEPARIN (PORCINE) IN NACL 100-0.45 UNIT/ML-% IJ SOLN
800.0000 [IU]/h | INTRAMUSCULAR | Status: DC
  Administered 2015-11-13: 800 [IU]/h via INTRAVENOUS
  Filled 2015-11-12: qty 250

## 2015-11-12 MED ORDER — SERTRALINE HCL 100 MG PO TABS: 100.0000 mg | ORAL_TABLET | Freq: Every day | ORAL | Status: DC

## 2015-11-12 MED ORDER — ALBUMIN HUMAN 25 % IV SOLN
25.0000 g | Freq: Once | INTRAVENOUS | Status: AC
  Administered 2015-11-12: 25 g via INTRAVENOUS
  Filled 2015-11-12: qty 50

## 2015-11-12 MED ORDER — LORAZEPAM 1 MG PO TABS
1.0000 mg | ORAL_TABLET | Freq: Four times a day (QID) | ORAL | Status: DC | PRN
  Administered 2015-11-13: 1 mg via ORAL
  Filled 2015-11-12: qty 1

## 2015-11-12 MED ORDER — SPIRONOLACTONE 25 MG PO TABS: 100.0000 mg | ORAL_TABLET | Freq: Every day | ORAL | Status: DC

## 2015-11-12 MED ORDER — PROMETHAZINE HCL 25 MG/ML IJ SOLN: 12.5000 mg | Freq: Four times a day (QID) | INTRAMUSCULAR | Status: DC | PRN

## 2015-11-12 MED ORDER — SODIUM CHLORIDE 0.9 % IV SOLN: INTRAVENOUS | Status: DC

## 2015-11-12 MED ORDER — IOHEXOL 300 MG/ML  SOLN
100.0000 mL | Freq: Once | INTRAMUSCULAR | Status: AC | PRN
  Administered 2015-11-12: 100 mL via INTRAVENOUS

## 2015-11-12 MED ORDER — SERTRALINE HCL 100 MG PO TABS
100.0000 mg | ORAL_TABLET | Freq: Every day | ORAL | Status: DC
  Administered 2015-11-13 – 2015-11-16 (×4): 100 mg via ORAL
  Filled 2015-11-12 (×4): qty 1

## 2015-11-12 MED ORDER — LEVETIRACETAM 500 MG PO TABS
1000.0000 mg | ORAL_TABLET | Freq: Two times a day (BID) | ORAL | Status: DC
  Administered 2015-11-12 – 2015-11-16 (×8): 1000 mg via ORAL
  Filled 2015-11-12 (×9): qty 2

## 2015-11-12 MED ORDER — VITAMIN B-1 100 MG PO TABS
100.0000 mg | ORAL_TABLET | Freq: Every day | ORAL | Status: DC
  Administered 2015-11-12 – 2015-11-16 (×3): 100 mg via ORAL
  Filled 2015-11-12 (×5): qty 1

## 2015-11-12 MED ORDER — FOLIC ACID 1 MG PO TABS
1.0000 mg | ORAL_TABLET | Freq: Every day | ORAL | Status: DC
  Administered 2015-11-12 – 2015-11-16 (×5): 1 mg via ORAL
  Filled 2015-11-12 (×5): qty 1

## 2015-11-12 MED ORDER — FUROSEMIDE 40 MG PO TABS
40.0000 mg | ORAL_TABLET | Freq: Every day | ORAL | Status: DC
Start: 2015-11-12 — End: 2015-11-12

## 2015-11-12 NOTE — Progress Notes (Addendum)
Interim Progress Note  S: Patient's CT scan shows new splenic infarct, new thrombus extending into left common iliac, progression of pancreatic pseudocysts with evidence of hemorrhage in one, acute pancreatitis, worsening pleural effusion and stable/complex ascites. Patient states his pain has remained unchanged. He does endorse some dyspnea when speaking for long sentences. No chest pain.   O: BP 103/70 mmHg  Pulse 111  Temp(Src) 97.7 F (36.5 C) (Oral)  Resp 18  SpO2 97%  Gen: lying in bed, no distress, watching the warriors/clippers basketball game Resp: unlabored, no tachypnea Abd: Soft, guarding, diffuse tenderness Ext: 1+ Left DP with 1+ popliteal pulse. Left leg is warm. Able to move extremities freely with no pain  A/P Patient needs anticoagulation for acute arterial thrombus. Concern for worsening of hemorrhagic cyst. Discussed case with Dr. Lake Bells, [place]. Recommended patient start albumin to increase intravascular volume in setting of third spacing in addition to continuing diuresis. Hold IVF for now. Patient afebrile. Not thinking related to SBP. Patient's hypotension/tachycardia likely secondary to intravascular depletion. No altered mental status. Pain likely secondary to acute pancreatitis. If patient develops fevers, low threshold for cultures and antibiotics. Will start heparin per pharmacy, obtain a lactic acid, CBC q4 hours. Will need to watch him closely.

## 2015-11-12 NOTE — Progress Notes (Addendum)
ANTICOAGULATION CONSULT NOTE - Initial Consult  Pharmacy Consult for Heparin  Indication: Splenic infarct, left common iliac artery thrombus  Allergies  Allergen Reactions  . Azithromycin Anaphylaxis and Swelling    Throat swelling, body swelling  . Lisinopril Anaphylaxis, Hives and Swelling    REACTION: facial/neck edema   Vital Signs: Temp: 97.7 F (36.5 C) (02/02 2008) Temp Source: Oral (02/02 2008) BP: 103/70 mmHg (02/02 2008) Pulse Rate: 111 (02/02 2008)  Labs:  Recent Labs  11/12/15 1702  HGB 9.5*  HCT 31.1*  PLT 602*  CREATININE 1.06   Estimated Creatinine Clearance: 58.3 mL/min (by C-G formula based on Cr of 1.06).  Medical History: Past Medical History  Diagnosis Date  . Angioedema     rx requiring intubation/vent support suspected secondary to ACE1 (but also on zithromax) 5/09 hospitalization   . Smoker   . ETOH abuse   . Chronic pancreatitis ([place])     (10/08 hops). admx 02/2010 pseudocyst aspirated during admxn. pancreatic dual stent placement at [facility] 11/11  . Cirrhosis ([place]) 2011    due to ETOH with recent hepatic abcess and possible HCC.   . Gastritis     alcohol induced  . Depression with anxiety     no hx of meds  . VF (ventricular fibrillation) ([place]) 2011    arrest 6/11. successfully rescucitated w/prolinged hospitalization due to respitatory failure. QT prolongation during cooling, now resolved  . History of stroke 2011    reports it was caused by pain from pancreatitis- caused heart to stop and stroke   . 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 Edema   . 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 Anoxic brain damage ([facility])   . Anemia   . Hypertension 2011  . Arthritis   . Chronic pain 2011  . Hyperlipidemia 2011  . Inguinal hernia 2011  . COPD (chronic obstructive pulmonary disease) ([place])     DOE  . GERD (gastroesophageal reflux disease)   . Pneumonia     hx of  . Stroke [facility])     right sided weakness; affected  long term memory  . Diabetes mellitus without complication ([place])     Type 2, pt reports no longer takes meds & A1C was normal.  . Myocardial infarction ([place])   . Seizures ([place]) 2011    had seizure 11-24-14/went to ED per pt.    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 Starting heparin per pharmacy. Noted one pancreatic pseudocyst with some hemorrhagic contents/Hgb 9.5 (monitor closely).   Goal of Therapy:  Heparin level 0.3-0.7 units/ml Monitor platelets by anticoagulation protocol: Yes   Plan:  -Defer bolus with Lovenox injection ~1 hour ago -Start heparin infusion at 800 units/hr -0500 HL -Daily CBC/HL -Monitor for bleeding, trend Hgb  Narda Bonds 11/12/2015,11:17 PM

## 2015-11-12 NOTE — Assessment & Plan Note (Signed)
Patient with acute on chronic pancreatitis. Ascitic fluid with elevated bilirubin and amylase concerning for duodenal/pancreatic leak to peritoneal cavity. Also deconditioning since discharge from hospital.  -Will admit for GI evaluation

## 2015-11-12 NOTE — Consult Note (Signed)
[facility] Consult: 8:14 AM 11/13/2015  LOS: 1 day    Referring Provider:Dr Chambliss.   Primary Care Physician:  Chrisandra Netters, MD Primary Gastroenterologist:  Dr. Henrene Pastor (direct colonoscopy only)    Reason for Consultation:  Abdominal pain, FTT in pt recently diagnosed with pancreatitis and ascites.    HPI: Steven Frey is a 60 y.o. male.  Numerous medical issues including chronic alcoholism (3 beers, 3 - 5 oz gin daily), cirrhosis, liver abscesses 2011, recurrent acute on chronic alcoholic pancreatitis dating to at least 2008. S/p ERCP stenting of pancreatic duct 2011at [facility], "significant ductal leak" noted.  C diff 03/2010.  Protein malnutrition dating back several years. Normal acute Hepatitis serologies 12/2014   Anoxic brain injury after 2011 v fib arrest. Chronic pain.  CVA with right sided weakness.  COPD with ongoing smoking.  S/p bil inguinal hernia repair. MRI [id number] with complicated pancreatitis, multiple fluid collections, pseudocyst, high suspicion for pancreas divisum, ascites, left liver lesion favoring altered perfusion secondary to portal vein findings detailedbelow and possible mass effect from surrounding pseudocysts .   11/2014 Colonoscopy average risk screening. 5 mm ascending tubular adenoma, moderate diverticulosis throughout.  2011 ERCP: pancreatic duct stricture with ductal leak, s/p stenting, minor sphincterotomy. PD stent removed 10/2010. Dr Cristy Folks. .   Admitted 1/23 - 11/06/15 with acute on chronic ETOH pancreatitis.  Lipase 600, LFTs normal.   CT abdomen with peripancreatic fluid and chronic calcifications but no necrosis,  Cirrhosis, ascites, pleural and pericardial effusion.  11/04/15 1 liter paracentesis:  WBCs 833, Amylase 8324, t bili 2.5, albumin 1.1: treatment consisted of  inpt Rocephin, 5 day outpt course of Augmentin, followed by daily Cipro thereafter. Pain improved (2/10 at discharge)  after treatment with IVF. Child B cirrhosis score.  For ascites Lasix 40, aldactone 100 added. DX with severe protein cal malnutrition: 40 # weight loss in 1 year noted.  Hgb 8.9 to 8.3, baseline .  Pain mgt with Oxycodone 5.  Discharged on Creon  Readmitted from MD office 2/3 with FTT, ongoing abdominal pain. It is diffuse, more in upper belly, radiates to back, increased post prandial  somewhat controlled with po narcotic but he ran out of Rx.  Only occasional nausea and emesis: not a big issue.  Occasional loose stool, also not an issue for him.  No swelling or increased girth.  Lipase 201, normal LFTs except low albumin.   Hgb 7.6 (down from 9.5 yesterday afternoon), platelets 543, WBCs 19. 6 yesterday, 14.3 today (16.4 on 1/27)  CT with progression pseudocysts, 1 is hemorrhagic.  Stable omental edema/fat necrosis, chronic splenic vein occlusion with collaterals.  New small splenic infarct. Left pleural effusion progressed. Ascites. New left common iliac artery thrombus.    Pt lives with his dad and they both drink beer/gin together.  No aleve, goodies etc.    Past Medical History  Diagnosis Date  . Angioedema     rx requiring intubation/vent support suspected secondary to ACE1 (but also on zithromax) 5/09 hospitalization   . Smoker   . ETOH abuse   .  Chronic pancreatitis ([place])     (10/08 hops). admx 02/2010 pseudocyst aspirated during admxn. pancreatic dual stent placement at [facility] 11/11  . Cirrhosis ([place]) 2011    due to ETOH with recent hepatic abcess and possible HCC.   . Gastritis     alcohol induced  . Depression with anxiety     no hx of meds  . VF (ventricular fibrillation) ([place]) 2011    arrest 6/11. successfully rescucitated w/prolinged hospitalization due to respitatory failure. QT prolongation during cooling, now resolved  . History of stroke 2011     reports it was caused by pain from pancreatitis- caused heart to stop and stroke   . 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 Edema   . 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 Anoxic brain damage ([facility])   . Anemia   . Hypertension 2011  . Arthritis   . Chronic pain 2011  . Hyperlipidemia 2011  . Inguinal hernia 2011  . COPD (chronic obstructive pulmonary disease) ([place])     DOE  . GERD (gastroesophageal reflux disease)   . Pneumonia     hx of  . Stroke [facility])     right sided weakness; affected long term memory  . Diabetes mellitus without complication ([place])     Type 2, pt reports no longer takes meds & A1C was normal.  . Myocardial infarction ([place])   . Seizures ([place]) 2011    had seizure 11-24-14/went to ED per pt.     Past Surgical History  Procedure Laterality Date  . Right wrist orif  1976    for fx  . Pancreatic stent placement    . Inguinal hernia repair  1960    right  . Inguinal hernia repair Left 07/12/2013    Procedure: HERNIA REPAIR INGUINAL ADULT;  Surgeon: Adin Hector, MD;  Location: [place];  Service: General;  Laterality: Left;  . Insertion of mesh Left 07/12/2013    Procedure: INSERTION OF MESH;  Surgeon: Adin Hector, MD;  Location: [facility];  Service: General;  Laterality: Left;    Prior to Admission medications   Medication Sig Start Date End Date Taking? Authorizing Provider  amLODipine (NORVASC) 5 MG tablet TAKE 1 TABLET (5 MG TOTAL) BY MOUTH DAILY. 09/30/15   Leeanne Rio, MD  amoxicillin-clavulanate (AUGMENTIN) 875-125 MG tablet Take 1 tablet by mouth 2 (two) times daily. Starting 11/07/2015 11/06/15   Mercy Riding, MD  carvedilol (COREG) 3.125 MG tablet TAKE 1 TABLET BY MOUTH 2 TIMES DAILY WITH A MEAL. 12/25/14   Leeanne Rio, MD  ciprofloxacin (CIPRO) 500 MG tablet Take 1 tablet (500 mg total) by mouth daily. Starting 11/10/2015 11/06/15   Mercy Riding, MD  [facility] B-12 1000 MCG TBCR TAKE 1 TABLET BY MOUTH DAILY     Leeanne Rio, MD  folic acid (FOLVITE) 1 MG tablet TAKE 1 TABLET BY MOUTH DAILY    Leeanne Rio, MD  furosemide (LASIX) 40 MG tablet Take 1 tablet (40 mg total) by mouth daily. 11/06/15   Mercy Riding, MD  levETIRAcetam (KEPPRA) 1000 MG tablet TAKE 1 TABLET BY MOUTH 2 TIMES DAILY. 09/30/15   Leeanne Rio, MD  lipase/protease/amylase (CREON) 12000 units CPEP capsule Take 1 capsule (12,000 Units total) by mouth 3 (three) times daily before meals. 11/06/15   Mercy Riding, MD  Misc. Devices (COMMODE BEDSIDE) MISC Use daily. 11/11/15   Leeanne Rio, MD  Misc. Devices Pace)  MISC Use daily. 11/11/15   Leeanne Rio, MD  oxyCODONE (OXY IR/ROXICODONE) 5 MG immediate release tablet Take 1 tablet (5 mg total) by mouth every 4 (four) hours as needed for moderate pain or severe pain. 11/06/15   Mercy Riding, MD  polyethylene glycol powder (GLYCOLAX/MIRALAX) powder Take 17 g by mouth daily as needed. 10/20/14   Leeanne Rio, MD  promethazine (PHENERGAN) 12.5 MG tablet Take 1 tablet (12.5 mg total) by mouth every 6 (six) hours as needed for nausea or vomiting. 11/06/15   Mercy Riding, MD  sertraline (ZOLOFT) 100 MG tablet TAKE 1 TABLET EVERY DAY 09/07/15   Leeanne Rio, MD  spironolactone (ALDACTONE) 100 MG tablet Take 1 tablet (100 mg total) by mouth daily. 11/06/15   Mercy Riding, MD  thiamine (VITAMIN B-1) 100 MG tablet Take 1 tablet (100 mg total) by mouth daily. 12/25/12   Jacquelyn A McGill, MD    Scheduled Meds: . folic acid  1 mg Oral Daily  . furosemide  40 mg Oral Daily  . levETIRAcetam  1,000 mg Oral BID  . lipase/protease/amylase  12,000 Units Oral TID AC  . multivitamin with minerals  1 tablet Oral Daily  . sertraline  100 mg Oral Daily  . spironolactone  100 mg Oral Daily  . thiamine  100 mg Oral Daily   Or  . thiamine  100 mg Intravenous Daily   Infusions: . sodium chloride    . heparin 800 Units/hr (11/13/15 0106)   PRN Meds:    Allergies as  of 11/12/2015 - Review Complete 11/12/2015  Allergen Reaction Noted  . Azithromycin Anaphylaxis and Swelling 10/08/2010  . Lisinopril Anaphylaxis, Hives, and Swelling     Family History  Problem Relation Age of Onset  . Coronary artery disease Neg Hx   . Colon cancer Neg Hx   . Alcohol abuse Mother   . Alcohol abuse Father   . Alcohol abuse    . Early death Mother   . Diabetes Sister   . Hyperlipidemia Sister   . Hypertension Sister   . Prostate cancer Father 50  . Breast cancer Paternal Grandmother     Social History   Social History  . Marital Status: Divorced    Spouse Name: N/A  . Number of Children: N/A  . Years of Education: N/A   Occupational History  . Not on file.   Social History Main Topics  . Smoking status: Current Every Day Smoker -- 0.25 packs/day    Types: Cigarettes  . Smokeless tobacco: Never Used  . Alcohol Use: 0.0 oz/week    0 Standard drinks or equivalent per week     Comment: hx of alcohol abuse, every 2-3 weeks beer on and off   . Drug Use: No     Comment: h/o THC, cocaine, heroin; no longer using since 2011  . Sexual Activity: No   Other Topics Concern  . Not on file   Social History Narrative   Divorced, 2 children. 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, who referred him here      Previously [facility] patient, last seen 04/2012   Dr. Rayann Heman is his cardiologist, last seen 03/2012; sees 1x/year    Dr. Krista Blue, [facility], is neurologist for seizures       Single, unemployed    Lives with his 56yo father   Current smoker since [place]: Constitutional:  Per HPI.   ENT:  No nose bleeds Pulm:  No SOB or cough CV:  No palpitations, no LE edema. No chest pain.  GU:  No hematuria, no frequency GI:  Per HPI.  No dysphagia.  No blood in stool Heme:  No unusual bleeding or bruising.   Transfusions:  None ever.  Took po iron in past.  Neuro:  No headaches, no peripheral tingling or numbness.   Gait problems, uses cane.  3 to 4 syncopal spells in last year, wakes up quickly.  Occasional positional dizziness.  Derm:  No itching, no rash or sores.  Endocrine:  No sweats or chills.  No polyuria or dysuria Immunization:  Reviewed, do not see completed flu shot for 2016/17 season.  Travel:  None beyond local counties in last few months.    PHYSICAL EXAM: Vital signs in last 24 hours: Filed Vitals:   11/12/15 2008 11/13/15 0506  BP: 103/70 102/57  Pulse: 111 100  Temp: 97.7 F (36.5 C) 97.7 F (36.5 C)  Resp: 18 18   Wt Readings from Last 3 Encounters:  11/13/15 53.751 kg (118 lb 8 oz)  11/12/15 54.296 kg (119 lb 11.2 oz)  11/02/15 59.875 kg (132 lb)   General: thin, looks somewhat chronically ill.  comfortable Head:  No swelling or asymmetry  Eyes:  No icterus or pallor Ears:  Not HOH.    Nose:  No congestion or discharge Mouth:  Poor dentition, gum disease.  Plaque and caries.  Neck:  No mass, no TMG.   Lungs:  Clear bil.  No cough Heart: RRR.  No mrg.  S1/S2 audible Abdomen:  Thin, soft, no palpable masses.  BS active.  No hernias.  Tender diffusely mid to upper abdomen bilaterally.  No guard or rebound.  BS hypoactive, no tinkling or tympanitic sounds   Rectal: deferred   Musc/Skeltl: contracture of right UE/hand with subltle involuntary movements.  Global muscular atrophy.  Extremities:  No CCE.    Neurologic:  Oriented x3.  Speech slow but accurate.  Involuntary movement in right UE.   Skin:  No rash or sores Tattoos:  On upper left chest   Psych:  Cooperative, calm.  Intake/Output from previous day: 02/02 0701 - 02/03 0700 In: -  Out: 200 [Urine:200] Intake/Output this shift:    LAB RESULTS:  Recent Labs  11/12/15 1702 11/13/15 0216  WBC 19.6* 15.2*  HGB 9.5* 7.4*  HCT 31.1* 23.8*  PLT 602* 541*   BMET Lab Results  Component Value Date   NA 139 11/13/2015   NA 140 11/12/2015   NA 140 11/06/2015   K 4.0 11/13/2015   K 4.8 11/12/2015   K 3.8  11/06/2015   CL 102 11/13/2015   CL 102 11/12/2015   CL 102 11/06/2015   CO2 24 11/13/2015   CO2 22 11/12/2015   CO2 29 11/06/2015   GLUCOSE 91 11/13/2015   GLUCOSE 124* 11/12/2015   GLUCOSE 118* 11/06/2015   BUN 13 11/13/2015   BUN 15 11/12/2015   BUN 7 11/06/2015   CREATININE 0.97 11/13/2015   CREATININE 1.06 11/12/2015   CREATININE 0.67 11/06/2015   CALCIUM 8.4* 11/13/2015   CALCIUM 8.8* 11/12/2015   CALCIUM 8.1* 11/06/2015   LFT  Recent Labs  11/12/15 1702 11/13/15 0706  PROT 6.3* 5.7*  ALBUMIN 1.9* 2.1*  AST 21 17  ALT 10* 9*  ALKPHOS 74 58  BILITOT 0.6 0.7   PT/INR Lab Results  Component Value Date  INR 1.23 11/02/2015   INR 1.07 12/26/2014   INR 0.98 01/30/2012   Hepatitis Panel No results for input(s): HEPBSAG, HCVAB, HEPAIGM, HEPBIGM in the last 72 hours. C-Diff No components found for: CDIFF Lipase     Component Value Date/Time   LIPASE 201* 11/12/2015 1702    Drugs of Abuse     Component Value Date/Time   LABOPIA NONE DETECTED 01/31/2012 0014   LABOPIA NEG 03/09/2010 2157   COCAINSCRNUR NONE DETECTED 01/31/2012 0014   COCAINSCRNUR NEG 07/07/2010 2141   LABBENZ NONE DETECTED 01/31/2012 0014   LABBENZ NEG 07/07/2010 2141   AMPHETMU NONE DETECTED 01/31/2012 0014   AMPHETMU NEG 07/07/2010 2141   THCU NONE DETECTED 01/31/2012 0014   LABBARB NONE DETECTED 01/31/2012 0014     RADIOLOGY STUDIES: Ct Abdomen Pelvis W Contrast  11/12/2015  CLINICAL DATA:  Intermittent left upper quadrant pain for 3 weeks. EXAM: CT ABDOMEN AND PELVIS WITH CONTRAST TECHNIQUE: Multidetector CT imaging of the abdomen and pelvis was performed using the standard protocol following bolus administration of intravenous contrast. CONTRAST:  175mL OMNIPAQUE IOHEXOL 300 MG/ML  SOLN COMPARISON:  11/02/2015 FINDINGS: Lower chest and abdominal wall: Increase in left pleural effusion, small where seen, with new amorphous high density dependently. Bibasilar atelectasis. Ground-glass  opacity in the subpleural left lower lobe could be inflammatory or atelectatic Hepatobiliary: Left liver cysts. Layering high density material in the gallbladder which could reflect sludge. No calcified gallstone. Pancreas: Sequela of pancreatitis with more organized peripancreatic collection posterior to the body measuring 42 x 19 mm in axial dimension. This collection has high density center and peripheral low-density rim consistent with hemorrhage. There is a rim enhancing ovoid collection between the pancreas and small bowel measuring 25 by 13 mm, 2:26. New rounded fluid collection measuring 32 mm in the interloop left peritoneal space. Pelvic predominant ascites with peritoneal enhancement and layering debris. This has a similar appearance to the left pleural findings. There are changes of chronic pancreatitis in the uncinate process. 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 Spleen: Low-density area in the subcapsular lower spleen compatible with interval infarct. Adrenals/Urinary Tract: Negative adrenals. No hydronephrosis or stone. Unremarkable bladder. Reproductive:No pathologic findings. Stomach/Bowel: No obstruction. Gastric wall thickening consider reactive pancreatitis. Vascular/Lymphatic: Chronic left splenic vein occlusion well-formed collaterals. Atherosclerosis with low-density filling defect in the left common iliac artery compatible with thrombus. Related stenosis is moderate. More distally there is a high-grade to moderate external iliac artery stenosis from chronic plaque. Peritoneal: No ascites or pneumoperitoneum. Musculoskeletal: No acute abnormalities. IMPRESSION: 1. Complicated pancreatitis with progression of pseudocysts, 1 with hemorrhagic contents, which are described above. Stable omental edema/fat necrosis. 2. 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 New small splenic infarct. 3. Increased left pleural effusion with layering debris  or hemorrhage. 4. Despite interval paracentesis, stablvolume complex ascites. 5. Newly formed thrombus projecting into the left common iliac artery with moderate luminal stenosis. Electronically Signed   By: Monte Fantasia M.D.   On: 11/12/2015 22:05    ENDOSCOPIC STUDIES: Per HPI  IMPRESSION:   *  Acute on chronic pancreatitis, ETOH etiology.  2011 stenting of PD stricture with leak. Complications include pseudocysts, omental fat necrosis, splenic vein occlusion, splenic infarct.  Suspect recurrent PD leak. On Heparin drip.    *  Cirrhosis and ascites by CT. 11/04/15 Paracentesis 1 litre. Labs reveal SBP which was/is treated appropriately.  Also shows high amylase.  Appears to have been compliant with diuretics, abx, not clear about  pancreatic enzymes.    *  Protein cal malnutrition, not new.   *  Chronic alcoholism.  Pt adamantly claims he was never told to stop ETOH  *  Normocytic anemia.    PLAN:     *  Per Dr Loletha Carrow.    Azucena Freed  11/13/2015, 8:14 AM Pager: [phone]

## 2015-11-12 NOTE — Assessment & Plan Note (Addendum)
Tachycardic to 117 in clinic today. He has history of this. However, his blood pressure is low to 91/54 today. No chest pain or shortness of breath. Felt lightheaded with standing on exam. This could be from deconditioning as well. -Will admit and rehydrate with fluid -Hold blood pressure medications while tachycardic and tachypnic

## 2015-11-12 NOTE — H&P (Signed)
[facility] Admission History and Physical Service Pager: [phone]  Patient name: Steven Frey Medical record number: [id number] Date of birth: 12/11/1955 Age: 60 y.o. Gender: male  Primary Care Provider: Chrisandra Netters, MD Consultants: GI Code Status: Full (per discussion on admission)  Chief Complaint: abdominal pain   Assessment and Plan: Steven Noland. is a 60 y.o. male presenting with acute on chronic pancreatitis. PMH is significant for chronic pancreatitis, alcohol use d/o, cirrhosis, hypertension, CVA, CAD, seizure d/o, anemia and depression.  Abdominal Pain: Could be acute on chronic pancreatitis, likely secondary to alcohol abuse vs bowel perforation vs bile leak vs SBP vs malignant process. Recently admitted 1/24 -1/27 for acute on chronic pancreatitis. At that time, patient had ascites on CT. US guided paracentesis removed 1 L of fluid and labs revealed exudative fluid with ascitic bili of 2.5 (results were pending at time of discharge). Patient discharged with oxycodone and was told to take Lipo-Gold or Mega-zymefrom GNC. At hospital follow up today in clinic with Dr. Cyndia Skeeters, abdominal pain has worsened and continues to have NVD without steatosis, hematemesis or hematochezia. Tachycardic on exam but afebrile.  Of note; patient had pancreatic duct stenting in 2011 at [facility]. 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 Last colonoscopy 11/2014 unremarkable other than 1 tubular adenoma. Has had unintentional 40 lb weight loss in the last year. Patient treated for SBP at last admission with PMNs of about 266. Culture negative (received antibiotics prior to paracentesis). Possible this could be related to complications of previous stent placement.  -Admit to med-surg with tele. Attending Dr. Erin Hearing -NPO with ice chips. May consider advancing diet in am  -NS @ 100 cc for 12 hours  -Hold off on antibiotics  given low suspicion for infection now, will reconsider with labs  -Hold home statin -CMP, CBC, Lipase -IV fentanyl 25 mcg q2h as needed severe pain -CT abdomen -GI consulted, appreciate their assistance  Seizure disorder: stable. He is on Keppra 1000 mg twice a day at home.  -Keppra 1000 mg twice a day starting now  H/o CVA, MI in 2011: reports that this is secondary his heart attack and pancreatitis in 2011. No history of CAD in his chart. Last stress test in 2014 and last Echo in 2016 within normal. 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 Child-Pugh Classification Grade A (100% > 85% survival 1-2 years), MELD Score 9 (3 mo mortality 1.9%, LOW). Chronic history of alcohol abuse without reported history of complicated alcohol withdrawals. Last drink 2 weeks ago according to patient. -CIWA for EtOH use  Anemia: Hgb 8.3 on last admission. Previous iron panels in 2011 showed iron deficiency and elevated ferritin. Last colonoscopy a year ago with tubular non villous polyp.  - AM CBC  Hypertension: normotensive here. On amlodipine 5 mg anc coreg 3.125 mg twice a day  -hold home meds for now, given poor PO and IV analgesia without significant HTN on admit.  Protein-Calorie malnutrition, Severe About 40 lbs weight loss in one year. Likely from pancreatitis, chronic alcohol abuse, poor appetite. Can't rule out malignancy -Nutrition consult when he starts by mouth   Depression: stable. On zoloft at home -hold while NPO (consider resuming sips with meds tomorrow if improved but still NPO)  FEN/GI: NS @ 100  cc/hr, NPO Prophylaxis: Lovenox  Disposition: Admit to inpatient FPTS, attending Dr. Erin Hearing  History of Present Illness:  Steven Frey is a 60 y.o. male presenting with abdominal pain. Patient  presented to PCP for a hospital follow-up. He was admitted about one week ago and treated for pancreatitis. He reports, since discharge, that he has continued to have some stabbing/burning right/left upper quadrant pain. He reports that pain is chronic through the day with some foods making it worse, although nothing specifically. He has been taking Oxycodone which helps slightly with his pain. He reports no nausea or chills. He reports some episodes of non-bloody, non-bilious emesis and infrequent diarrhea. He reports no hematochezia or melena. He has infrequent dysuria with no gross hematuria. He has chronic arthralgias/myalgias.  1/2 PPD x39 years Cocaine last used 40 years ago 3-4 beers per day, last drink 3 weeks ago   Review Of Systems: Per HPI with the following additions: see HPI Otherwise the remainder of the systems were negative.  Patient Active Problem List   Diagnosis Date Noted  . Pancreatic ascites 11/12/2015  . Spontaneous bacterial peritonitis ([place])   . Ascites   . Protein-calorie malnutrition, severe ([place]) 11/03/2015  . Pancreatitis, acute   . Pancreatitis 11/02/2015  . Hyperglycemia 03/05/2015  . Pain and swelling of right forearm 03/05/2015  . Diastolic dysfunction [id number]  . Syncope 12/26/2014  . Constipation 10/26/2014  . Major depression, single episode 09/26/2014  . Preoperative cardiovascular examination 03/28/2013  . 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 Left inguinal hernia 10/30/2012  . Seizures ([place]) 09/03/2012  . History of CVA (cerebrovascular accident) 01/30/2012  . Sinus tachycardia ([place]) 10/26/2011  . Pre-operative cardiovascular examination 10/26/2011  . 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 ARM PAIN, RIGHT 11/02/2010  . ANXIETY 10/08/2010  . URI 08/24/2010  . LEG EDEMA 08/24/2010  . ABSCESS OF LIVER 08/13/2010  . Anoxic brain damage ([place]) 07/07/2010  . CARDIAC ARREST 05/24/2010  . VITAMIN B12 DEFICIENCY 03/25/2010   . Hyperlipidemia 03/15/2010  . ANEMIA 03/15/2010  . CIRRHOSIS, ALCOHOLIC 0000000  . Chronic pancreatitis ([place]) 02/23/2010  . ERECTILE DYSFUNCTION 01/26/2009  . Alcohol abuse 01/26/2009  . VISUAL IMPAIRMENT 01/26/2009  . CIGARETTE SMOKER 03/10/2008  . HYPERTENSION, BENIGN 03/10/2008    Past Medical History: Past Medical History  Diagnosis Date  . Angioedema     rx requiring intubation/vent support suspected secondary to ACE1 (but also on zithromax) 5/09 hospitalization   . Smoker   . ETOH abuse   . Chronic pancreatitis ([place])     (10/08 hops). admx 02/2010 pseudocyst aspirated during admxn. pancreatic dual stent placement at [facility] 11/11  . Cirrhosis ([place]) 2011    due to ETOH with recent hepatic abcess and possible HCC.   . Gastritis     alcohol induced  . Depression     no hx of meds  . VF (ventricular fibrillation) ([place]) 2011    arrest 6/11. successfully rescucitated w/prolinged hospitalization due to respitatory failure. QT prolongation during cooling, now resolved  . History of stroke 2011    reports it was caused by pain from pancreatitis- caused heart to stop and stroke   . 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 Anxiety   . Edema   . 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 Anoxic brain damage (HCC)   . Anemia   . Hypertension 2011  . Arthritis   . Chronic pain 2011  . Hyperlipidemia 2011  . Inguinal hernia 2011  .  COPD (chronic obstructive pulmonary disease) ([place])   . GERD (gastroesophageal reflux disease)   . Shortness of breath     exertion  . Pneumonia     hx of  . Stroke [facility])     right sided weakness; affected long term memory  . Diabetes mellitus without complication ([place])     Type 2, pt reports no longer takes meds & A1C was normal.  . Myocardial infarction ([place])   . Seizures ([place]) 2011    had seizure 11-24-14/went to ED per pt.     Past Surgical History: Past Surgical History  Procedure Laterality Date  . Right wrist  orif  1976    for fx  . Pancreatic stent placement    . Inguinal hernia repair  1960    right  . Inguinal hernia repair Left 07/12/2013    Procedure: HERNIA REPAIR INGUINAL ADULT;  Surgeon: Adin Hector, MD;  Location: [place];  Service: General;  Laterality: Left;  . Insertion of mesh Left 07/12/2013    Procedure: INSERTION OF MESH;  Surgeon: Adin Hector, MD;  Location: [place];  Service: General;  Laterality: Left;    Social History: Social History  Substance Use Topics  . Smoking status: Current Every Day Smoker -- 0.25 packs/day    Types: Cigarettes  . Smokeless tobacco: Never Used  . Alcohol Use: 0.0 oz/week    0 Standard drinks or equivalent per week     Comment: hx of alcohol abuse, every 2-3 weeks beer on and off    Additional social history: see above Please also refer to relevant sections of EMR.  Family History: Family History  Problem Relation Age of Onset  . Coronary artery disease Neg Hx   . Colon cancer Neg Hx   . Alcohol abuse Mother   . Alcohol abuse Father   . Alcohol abuse    . Early death Mother   . Diabetes Sister   . Hyperlipidemia Sister   . Hypertension Sister   . Prostate cancer Father 8  . Breast cancer Paternal Grandmother     Allergies and Medications: Allergies  Allergen Reactions  . Azithromycin Swelling    Throat swelling, body swelling  . Lisinopril Swelling and Other (See Comments)    REACTION: facial/neck edema   No current facility-administered medications on file prior to encounter.   Current Outpatient Prescriptions on File Prior to Encounter  Medication Sig Dispense Refill  . amLODipine (NORVASC) 5 MG tablet TAKE 1 TABLET (5 MG TOTAL) BY MOUTH DAILY. 90 tablet 0  . amoxicillin-clavulanate (AUGMENTIN) 875-125 MG tablet Take 1 tablet by mouth 2 (two) times daily. Starting 11/07/2015 6 tablet 0  . carvedilol (COREG) 3.125 MG tablet TAKE 1 TABLET BY MOUTH 2 TIMES DAILY WITH A MEAL. 180 tablet 3  . ciprofloxacin (CIPRO) 500  MG tablet Take 1 tablet (500 mg total) by mouth daily. Starting 11/10/2015 30 tablet 2  . [employer] B-12 1000 MCG TBCR TAKE 1 TABLET BY MOUTH DAILY 30 tablet 2  . folic acid (FOLVITE) 1 MG tablet TAKE 1 TABLET BY MOUTH DAILY 90 tablet 3  . furosemide (LASIX) 40 MG tablet Take 1 tablet (40 mg total) by mouth daily. 30 tablet 0  . levETIRAcetam (KEPPRA) 1000 MG tablet TAKE 1 TABLET BY MOUTH 2 TIMES DAILY. 60 tablet 0  . lipase/protease/amylase (CREON) 12000 units CPEP capsule Take 1 capsule (12,000 Units total) by mouth 3 (three) times daily before meals. 90 capsule 0  . Misc. Devices (  COMMODE BEDSIDE) MISC Use daily. 1 each 0  . Misc. Devices [facility]) MISC Use daily. 1 each 0  . oxyCODONE (OXY IR/ROXICODONE) 5 MG immediate release tablet Take 1 tablet (5 mg total) by mouth every 4 (four) hours as needed for moderate pain or severe pain. 60 tablet 0  . polyethylene glycol powder (GLYCOLAX/MIRALAX) powder Take 17 g by mouth daily as needed. 850 g 1  . promethazine (PHENERGAN) 12.5 MG tablet Take 1 tablet (12.5 mg total) by mouth every 6 (six) hours as needed for nausea or vomiting. 30 tablet 0  . sertraline (ZOLOFT) 100 MG tablet TAKE 1 TABLET EVERY DAY 30 tablet 3  . spironolactone (ALDACTONE) 100 MG tablet Take 1 tablet (100 mg total) by mouth daily. 30 tablet 0  . thiamine (VITAMIN B-1) 100 MG tablet Take 1 tablet (100 mg total) by mouth daily. 90 tablet 3    Objective: BP 100/69 mmHg  Pulse 115  Temp(Src) 98.6 F (37 C) (Axillary)  Resp 18  SpO2 97% Exam: General: Sitting up in chair, thin, alert, intermittent hiccupping causing visible pain Eyes: PERRLA, non icteric sclera ENTM: slightly dry mucous membranes, poor dentition Neck: supple Cardiovascular: Regular rate and rhythm, normal s1 and s2, no murmurs Respiratory: no increased work of breathing, clear to auscultation bilaterally, with slightly diminished breath sounds at left lower base Abdomen: soft, non distended, hyperactive bowel  sounds, diffuse tenderness to light palpation, guarding, no rebound tenderness. No hepatomegaly MSK: severe muscular mass loss in extremities, contracture and twitching in his right upper extremity. Right hand contractures Skin: no rashes or ecchymosis  Neuro: alert and oriented, CN 2-12 intact, normal and equal reflexes throughout. Clonus present bilaterally Psych: normal mood and affect  Labs and Imaging: CBC BMET   Recent Labs Lab 11/06/15 2033  WBC 16.4*  HGB 9.4*  HCT 30.5*  PLT 384    Recent Labs Lab 11/06/15 2033  NA 140  K 3.8  CL 102  CO2 29  BUN 7  CREATININE 0.67  GLUCOSE 118*  CALCIUM 8.1*      Carlyle Dolly, MD 11/12/2015, 4:13 PM PGY-1, [facility] pager: [phone], text pages welcome  I have seen and examined the patient. I have read and agree with the above note. My changes are noted in blue.  Cordelia Poche, MD PGY-3, [facility] 11/12/2015, 7:54 PM

## 2015-11-12 NOTE — Progress Notes (Signed)
   Subjective:    Patient ID: Steven Frey, male    DOB: June 27, 1956, 60 y.o.   MRN: [id number]  HPI Pancreatitis: patients presents to the clinic today for follow up on his pancreatitis. He reports pain is improving. Pain ranges from 5-8 on 10 point scale. 6/10 now. Worse with hiccups, deep breath not with food.  Physical therapy came two his house twice since he was discharged. However, he continued to feel week. He hasn't been able to get up by himself or walk. He hasn't been able eat well although this is improving for the last two days.. He says he feels full quickly. Reports feeling warm occasionally but didn't take his temperature at home. Denies nausea or vomiting. Reports diarrhea (loose stool). However, this is only every other day. Last BM is this morning. Steatorrhea.   Ascites: asitic fluid noted on abdominal CT on 01/23. He had US guided paracentesis on 1/25 with removal of 1L ascitic fluid. SAAG 0.6gm/dl suggestive for exudative fluid. Also with elevated PMNs and WBC but culture was negative at that time. Treated it as culture negative neutrocytic ascites (the same as SBP). However, his acsitic bilirubin and amylase came back elevated after he was discharged. This was brought to my attention by his PCP who received his labs.  Tachycardia: patient tachycardic to 117 in clinic today. He has been tachycardic in the past. However, he is hypotensive to 91/54 today  Protein-Calorie malnutrition: patient with 12 lbs weight loss in a week. He had 40 lbs weight loss in a year when he was admitted last week. Continues to have poor appetite. He has been taking his CREON but not with food.  Review of Systems Per HPI    Objective:   Physical Exam Filed Vitals:   11/12/15 1413  BP: 91/54  Pulse: 117  Temp: 97.5 F (36.4 C)  TempSrc: Oral  Height: 5\' 8"  (1.727 m)  Weight: 119 lb 11.2 oz (54.296 kg)  Gen: appears week and cachectic Eyes: sclera anicteric Oropharynx: clear, moist CV:  tachycardic, regular rythm. S1 & S2 audible Resp: no apparent work of breathing, clear to auscultation bilaterally. GI: bowel sounds normal, tenderness to palpation global, guarding but no rebound tenderness Skin: no lesion MSK: appears wasted. Not able to stand up on his own  Assessment & Plan:  Pancreatic ascites Patient with acute on chronic pancreatitis. Ascitic fluid with elevated bilirubin and amylase concerning for duodenal/pancreatic leak to peritoneal cavity. Also deconditioning since discharge from hospital.  -Will admit for GI evaluation  Tachycardia Tachycardic to 117 in clinic today. He has history of this. However, his blood pressure is low to 91/54 today. No chest pain or shortness of breath. Felt lightheaded with standing on exam. This could be from deconditioning as well. -Will admit and rehydrate with fluid -Hold blood pressure medications while tachycardic and tachypnic  Protein-calorie malnutrition, severe (HCC) Significant weight loss in short period of time (12lbs in a week). 50 lbs in a year. Likely from pancreatitis and anorexia. Can't rule out malignancy. He is at increased risk of liver cancer given cirrhosis -Admit to impatient service. Nutrition consult -Consider AFP

## 2015-11-12 NOTE — Assessment & Plan Note (Addendum)
Significant weight loss in short period of time (12lbs in a week). 50 lbs in a year. Likely from pancreatitis and anorexia. Can't rule out malignancy. He is at increased risk of liver cancer given cirrhosis -Admit to impatient service. Nutrition consult -Consider AFP

## 2015-11-13 ENCOUNTER — Inpatient Hospital Stay (HOSPITAL_COMMUNITY): Payer: Medicare Other

## 2015-11-13 DIAGNOSIS — R109 Unspecified abdominal pain: Secondary | ICD-10-CM | POA: Insufficient documentation

## 2015-11-13 DIAGNOSIS — J90 Pleural effusion, not elsewhere classified: Secondary | ICD-10-CM | POA: Insufficient documentation

## 2015-11-13 DIAGNOSIS — K859 Acute pancreatitis without necrosis or infection, unspecified: Secondary | ICD-10-CM | POA: Insufficient documentation

## 2015-11-13 DIAGNOSIS — E46 Unspecified protein-calorie malnutrition: Secondary | ICD-10-CM

## 2015-11-13 DIAGNOSIS — I745 Embolism and thrombosis of iliac artery: Secondary | ICD-10-CM

## 2015-11-13 DIAGNOSIS — D735 Infarction of spleen: Secondary | ICD-10-CM

## 2015-11-13 DIAGNOSIS — K8689 Other specified diseases of pancreas: Secondary | ICD-10-CM

## 2015-11-13 DIAGNOSIS — F1010 Alcohol abuse, uncomplicated: Secondary | ICD-10-CM

## 2015-11-13 DIAGNOSIS — K7031 Alcoholic cirrhosis of liver with ascites: Secondary | ICD-10-CM

## 2015-11-13 DIAGNOSIS — K861 Other chronic pancreatitis: Secondary | ICD-10-CM

## 2015-11-13 LAB — CBC
HCT: 23.8 % — ABNORMAL LOW (ref 39.0–52.0)
HCT: 24.1 % — ABNORMAL LOW (ref 39.0–52.0)
HCT: 23.6 % — ABNORMAL LOW (ref 39.0–52.0)
HCT: 28.5 % — ABNORMAL LOW (ref 39.0–52.0)
HCT: 25.2 % — ABNORMAL LOW (ref 39.0–52.0)
Hemoglobin: 7.4 g/dL — ABNORMAL LOW (ref 13.0–17.0)
Hemoglobin: 7.6 g/dL — ABNORMAL LOW (ref 13.0–17.0)
Hemoglobin: 7.3 g/dL — ABNORMAL LOW (ref 13.0–17.0)
Hemoglobin: 8.8 g/dL — ABNORMAL LOW (ref 13.0–17.0)
Hemoglobin: 7.8 g/dL — ABNORMAL LOW (ref 13.0–17.0)
MCH: 27.9 pg (ref 26.0–34.0)
MCH: 27.9 pg (ref 26.0–34.0)
MCH: 27.7 pg (ref 26.0–34.0)
MCH: 27.6 pg (ref 26.0–34.0)
MCH: 27.6 pg (ref 26.0–34.0)
MCHC: 31.1 g/dL (ref 30.0–36.0)
MCHC: 31.5 g/dL (ref 30.0–36.0)
MCHC: 30.9 g/dL (ref 30.0–36.0)
MCHC: 30.9 g/dL (ref 30.0–36.0)
MCHC: 31 g/dL (ref 30.0–36.0)
MCV: 89.8 fL (ref 78.0–100.0)
MCV: 88.6 fL (ref 78.0–100.0)
MCV: 89.4 fL (ref 78.0–100.0)
MCV: 89.3 fL (ref 78.0–100.0)
MCV: 89 fL (ref 78.0–100.0)
Platelets: 541 10*3/uL — ABNORMAL HIGH (ref 150–400)
Platelets: 543 10*3/uL — ABNORMAL HIGH (ref 150–400)
Platelets: 593 10*3/uL — ABNORMAL HIGH (ref 150–400)
Platelets: 648 10*3/uL — ABNORMAL HIGH (ref 150–400)
RBC: 2.65 MIL/uL — ABNORMAL LOW (ref 4.22–5.81)
RBC: 2.72 MIL/uL — ABNORMAL LOW (ref 4.22–5.81)
RBC: 2.64 MIL/uL — ABNORMAL LOW (ref 4.22–5.81)
RBC: 3.19 MIL/uL — ABNORMAL LOW (ref 4.22–5.81)
RBC: 2.83 MIL/uL — ABNORMAL LOW (ref 4.22–5.81)
RDW: 17.9 % — ABNORMAL HIGH (ref 11.5–15.5)
RDW: 17.7 % — ABNORMAL HIGH (ref 11.5–15.5)
RDW: 17.6 % — ABNORMAL HIGH (ref 11.5–15.5)
RDW: 17.6 % — ABNORMAL HIGH (ref 11.5–15.5)
RDW: 17.9 % — ABNORMAL HIGH (ref 11.5–15.5)
WBC: 15.2 10*3/uL — ABNORMAL HIGH (ref 4.0–10.5)
WBC: 14.3 10*3/uL — ABNORMAL HIGH (ref 4.0–10.5)
WBC: 14.9 10*3/uL — ABNORMAL HIGH (ref 4.0–10.5)
WBC: 16.3 10*3/uL — ABNORMAL HIGH (ref 4.0–10.5)
WBC: 16.5 10*3/uL — ABNORMAL HIGH (ref 4.0–10.5)

## 2015-11-13 LAB — COMPREHENSIVE METABOLIC PANEL
ALT: 9 U/L — ABNORMAL LOW (ref 17–63)
AST: 17 U/L (ref 15–41)
Albumin: 2.1 g/dL — ABNORMAL LOW (ref 3.5–5.0)
Alkaline Phosphatase: 58 U/L (ref 38–126)
Anion gap: 13 (ref 5–15)
BUN: 13 mg/dL (ref 6–20)
CO2: 24 mmol/L (ref 22–32)
Calcium: 8.4 mg/dL — ABNORMAL LOW (ref 8.9–10.3)
Chloride: 102 mmol/L (ref 101–111)
Creatinine, Ser: 0.97 mg/dL (ref 0.61–1.24)
GFR calc Af Amer: >60 mL/min (ref >60)
GFR calc non Af Amer: >60 mL/min (ref >60)
Glucose, Bld: 91 mg/dL (ref 65–99)
Potassium: 4 mmol/L (ref 3.5–5.1)
Sodium: 139 mmol/L (ref 135–145)
Total Bilirubin: 0.7 mg/dL (ref 0.3–1.2)
Total Protein: 5.7 g/dL — ABNORMAL LOW (ref 6.5–8.1)

## 2015-11-13 LAB — LACTIC ACID, PLASMA: Lactic Acid, Venous: 0.8 mmol/L (ref 0.5–2.0)

## 2015-11-13 LAB — GRAM STAIN

## 2015-11-13 LAB — BODY FLUID CELL COUNT WITH DIFFERENTIAL
Eos, Fluid: 0 %
Lymphs, Fluid: 2 %
Monocyte-Macrophage-Serous Fluid: 37 % — ABNORMAL LOW (ref 50–90)
Neutrophil Count, Fluid: 61 % — ABNORMAL HIGH (ref 0–25)
Total Nucleated Cell Count, Fluid: 9584 cu mm — ABNORMAL HIGH (ref 0–1000)

## 2015-11-13 LAB — GLUCOSE, SEROUS FLUID: Glucose, Fluid: 79 mg/dL

## 2015-11-13 LAB — PROTEIN, BODY FLUID: Total protein, fluid: <3 g/dL

## 2015-11-13 LAB — PROTIME-INR
INR: 1.35 (ref 0.00–1.49)
Prothrombin Time: 16.8 seconds — ABNORMAL HIGH (ref 11.6–15.2)

## 2015-11-13 LAB — GLUCOSE, CAPILLARY: Glucose-Capillary: 80 mg/dL (ref 65–99)

## 2015-11-13 LAB — LACTATE DEHYDROGENASE: LDH: 238 U/L — ABNORMAL HIGH (ref 98–192)

## 2015-11-13 LAB — HEPARIN LEVEL (UNFRACTIONATED)
Heparin Unfractionated: 0.33 IU/mL (ref 0.30–0.70)
Heparin Unfractionated: <0.1 IU/mL — ABNORMAL LOW (ref 0.30–0.70)

## 2015-11-13 LAB — SAVE SMEAR

## 2015-11-13 MED ORDER — LIDOCAINE HCL (PF) 1 % IJ SOLN
INTRAMUSCULAR | Status: AC
Start: 2015-11-13 — End: 2015-11-14
  Filled 2015-11-13: qty 10

## 2015-11-13 MED ORDER — LIDOCAINE VISCOUS 2 % MT SOLN
OROMUCOSAL | Status: AC
  Filled 2015-11-13: qty 15

## 2015-11-13 MED ORDER — DIATRIZOATE MEGLUMINE & SODIUM 66-10 % PO SOLN
ORAL | Status: AC
  Filled 2015-11-13: qty 30

## 2015-11-13 MED ORDER — IOHEXOL 300 MG/ML  SOLN
25.0000 mL | Freq: Once | INTRAMUSCULAR | Status: AC | PRN
  Administered 2015-11-13: 25 mL via ORAL

## 2015-11-13 MED ORDER — HEPARIN (PORCINE) IN NACL 100-0.45 UNIT/ML-% IJ SOLN
1450.0000 [IU]/h | INTRAMUSCULAR | Status: DC
  Administered 2015-11-13: 800 [IU]/h via INTRAVENOUS
  Administered 2015-11-14: 1100 [IU]/h via INTRAVENOUS
  Administered 2015-11-15 (×2): 1300 [IU]/h via INTRAVENOUS
  Administered 2015-11-16: 1450 [IU]/h via INTRAVENOUS
  Filled 2015-11-13 (×5): qty 250

## 2015-11-13 MED ORDER — VITAL AF 1.2 CAL PO LIQD: 1000.0000 mL | ORAL | Status: DC

## 2015-11-13 MED ORDER — VITAL AF 1.2 CAL PO LIQD
1000.0000 mL | ORAL | Status: DC
  Filled 2015-11-13 (×5): qty 1000

## 2015-11-13 MED ORDER — JEVITY 1.2 CAL PO LIQD: 1000.0000 mL | ORAL | Status: DC

## 2015-11-13 MED ORDER — ENSURE ENLIVE PO LIQD: 237.0000 mL | Freq: Two times a day (BID) | ORAL | Status: DC

## 2015-11-13 MED ORDER — SODIUM CHLORIDE 0.9 % IV SOLN
INTRAVENOUS | Status: DC
  Administered 2015-11-13 – 2015-11-14 (×2): via INTRAVENOUS

## 2015-11-13 NOTE — Progress Notes (Signed)
FPTS Interim Progress Note  S:Patient states that he is doing okay and that his pain is controlled currently   O: BP 107/66 mmHg  Pulse 113  Temp(Src) 97.5 F (36.4 C) (Oral)  Resp 18  Ht 5\' 8"  (1.727 m)  Wt 118 lb 8 oz (53.751 kg)  BMI 18.02 kg/m2  SpO2 98%  Alert and orientated x2 , able state name, current place, and month, but thought year was 43. Stated last president was Gillermina Hu    A/P: - Awaiting Dobhoff tube to placement  - Will need to follow mental status closely, differential to include hepatic encephalopathy vs Wernicke encephalopathy vs. Delirium due to acute pancreatitis  - Patient currently on thiamine for possible Wernicke's - Consider getting Ammonia   Steven Frey Cletis Media, MD 11/13/2015, 4:22 PM PGY-1, [facility] Service pager [phone]

## 2015-11-13 NOTE — Progress Notes (Addendum)
Initial Nutrition Assessment  DOCUMENTATION CODES:   Severe malnutrition in context of chronic illness, Underweight  INTERVENTION:    Once CORTRAK tube placed, initiate Vital AF 1.2 formula (peptide-based formula) at 15 ml/hr and increase by 10 ml every 8 hours to goal rate of 65 ml/hr  TF regimen to provide 1872 kcals, 117 gm protein, 1265 ml of free water  NUTRITION DIAGNOSIS:   Inadequate oral intake related to inability to eat as evidenced by NPO status   GOAL:   Patient will meet greater than or equal to 90% of their needs  MONITOR:   TF tolerance, Labs, Weight trends, I & O's  REASON FOR ASSESSMENT:   Malnutrition Screening Tool, Consult  ASSESSMENT:   60 Y/O M with PMX of HLD, chronic pancreatitis, HTN, Alcoholic liver cirrhosis, hx of CVA, Anemia, ED, cardiac arrest sinus tachycardia, depression, recent hospitalization for acute on chronic pancreatitis discharged few weeks ago. He presented with hx of abdominal pain on going for more than 3 wks. He stated he never got relieved since his last hospitalization. He described his pain as dull in nature about 8/10 in severity relieved by oxycodone which he was discharged home on during last hospitalization. He denies N/V, no constipation, but he developed some diarrhea 2 days ago. His appetite is poor and he has lost few pounds over the last few months. He endorse fatigue and weakness worsening since last hospital admission and discharge. He denies fever. No other concern.   RD consulted for TF initiation & management.  Patient reports a poor appetite for the last month.  Typically consumes 2 meals per day.  Recently seen per RD during previous hospitalization in January 2017.  Malnutrition ongoing.  Does drink Ensure supplements at home.  Pt also has had significant weight loss of approximately 20% in < 1 year.    Spoke with Dr. Emmaline Life via telephone.  Plan is for short-term nutrition support.  Order received for [facility] team  to place small bore feeding tube (tip post pyloric).  RN notified.  Nutrition-Focused physical exam completed. Findings are severe fat depletion, moderate muscle depletion, and no edema.   Diet Order:  Diet NPO time specified Except for: Sips with Meds  Skin:  Reviewed, no issues  Last BM:  2/2  Height:   Ht Readings from Last 1 Encounters:  11/13/15 5\' 8"  (1.727 m)    Weight:   Wt Readings from Last 1 Encounters:  11/13/15 118 lb 8 oz (53.751 kg)    Wt Readings from Last 10 Encounters:  11/13/15 118 lb 8 oz (53.751 kg)  11/12/15 119 lb 11.2 oz (54.296 kg)  11/02/15 132 lb (59.875 kg)  03/05/15 146 lb (66.225 kg)  12/25/14 152 lb 9.6 oz (69.219 kg)  11/26/14 166 lb (75.297 kg)  11/24/14 166 lb (75.297 kg)  11/13/14 166 lb 3.2 oz (75.388 kg)  10/20/14 169 lb (76.658 kg)  09/26/14 168 lb 3.2 oz (76.295 kg)    Ideal Body Weight:  70 kg  BMI:  Body mass index is 18.02 kg/(m^2).  Estimated Nutritional Needs:   Kcal:  1800-2000  Protein:  100-115 gm  Fluid:  1.8-2.0 L  EDUCATION NEEDS:   No education needs identified at this time  Arthur Holms, RD, LDN Pager #: [phone] After-Hours Pager #: [phone]

## 2015-11-13 NOTE — Progress Notes (Signed)
Family Medicine Teaching Service Daily Progress Note Intern Pager: [phone]  Patient name: Steven Frey Medical record number: [id number] Date of birth: 03-Mar-1956 Age: 60 y.o. Gender: male  Primary Care Provider: Chrisandra Netters, MD Consultants: GI and Surgery  Code Status: Full   Pt Overview and Major Events to Date:   Assessment and Plan: [name]. is a 60 y.o. male presenting with acute on chronic pancreatitis. PMH is significant for chronic pancreatitis, alcohol use d/o, cirrhosis, hypertension, CVA, CAD, seizure d/o, anemia and depression.  Abdominal Pain: Could be acute on chronic pancreatitis, likely secondary to alcohol abuse vs bowel perforation vs bile leak vs SBP vs malignant process. Recently admitted 1/24 -1/27 for acute on chronic pancreatitis. At that time, patient had ascites on CT. Patient with lipase of 201, AST 21, ALT 10, and lactic acid of 0.8. Patient is slightly tachy 103 and normotensive with  BP 102/57. WBC 19.6 > now  14.3. Ranson Criteria, Score of 2 make severe pancreatitis unlikely.  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 Child-Pugh Classification Grade A (100% > 85% survival 1-2 years), MELD Score 9 (3 mo mortality 1.9%, LOW). Chronic history of alcohol abuse without  reported history of complicated alcohol withdrawals. - Recent EtOH use including beer and gin with dad.  - CIWA for EtOH use  - Will get INR/PT   Anemia: Hgb 9.5> 7.6 this AM. However all cell lines are down, including WBC and platelets therefore likely dilutional. Less likely hemorrhagic or hemolysis. However will continue to watch carefully. - CBC q4hr, if continues to drop consider LDH, blood smear, and haptoglobin   Hypertension: Normotensive, will a MAP of 75 . Holding amlodipine 5 mg anc coreg 3.125 mg twice a day  - Continue to watch vitals q6hrs.   Protein-Calorie malnutrition, Severe About 40 lbs weight loss in one year. Likely from pancreatitis, chronic alcohol abuse, poor appetite. Can't rule out malignancy -Nutrition consult when he starts by mouth   Depression: stable. On zoloft at home -hold while NPO (consider resuming sips with meds tomorrow if improved but still NPO)  Seizure disorder: stable. He is on Keppra 1000 mg twice a day at home.  -Keppra 1000 mg twice a day starting now  H/o CVA, MI in 2011: reports that this is secondary his heart attack and pancreatitis in 2011. No history of CAD in his chart. Last stress test in 2014 and last Echo in 2016 within normal. His exam with contractures in his right arm and left sided weakness -Holding his statin in the setting of cirrhosis  FEN/GI: NS @ 100 cc/hr, NPO Prophylaxis: Lovenox  Disposition: Discharge home once improved   Subjective:  Patient with continued 10/10 pain worse with deep inspiratory breaths. Patient denies any chest pain, however does indicate that he feels SOB.   Objective: Temp:  [  97.5 F (36.4 C)-98.6 F (37 C)] 97.7 F (36.5 C) (02/03 0506) Pulse Rate:  [100-117] 100 (02/03 0506) Resp:  [18] 18 (02/03 0506) BP: (91-103)/(54-70) 102/57 mmHg (02/03 0506) SpO2:  [97 %] 97 % (02/03 0506) Weight:  [118 lb 8 oz (53.751 kg)-119 lb 11.2 oz (54.296 kg)] 118 lb 8 oz (53.751 kg) (02/03 0543) Physical  Exam: General: NAD, alert and orientated Cardiovascular: RRR, no murmurs, rubs or gallops  Respiratory: CTAB, no wheezes noted  Abdomen: hyperactive BS, guarding, epigastric tenderness as well as tenderness throughout abdomen, negative peritoneal however increased pain with intraabdominal pressure  Extremities: 2+ TP bilateral, both legs warm, and no swelling noted.   Laboratory:  Recent Labs Lab 11/12/15 1702 11/13/15 0216 11/13/15 0706  WBC 19.6* 15.2* 14.3*  HGB 9.5* 7.4* 7.6*  HCT 31.1* 23.8* 24.1*  PLT 602* 541* 543*    Recent Labs Lab 11/06/15 2033 11/12/15 1702 11/13/15 0706  NA 140 140 139  K 3.8 4.8 4.0  CL 102 102 102  CO2 29 22 24   BUN 7 15 13   CREATININE 0.67 1.06 0.97  CALCIUM 8.1* 8.8* 8.4*  PROT 5.8* 6.3* 5.7*  BILITOT 0.5 0.6 0.7  ALKPHOS 92 74 58  ALT 9* 10* 9*  AST 24 21 17   GLUCOSE 118* 124* 91    Imaging/Diagnostic Tests: Ct Abdomen Pelvis W Contrast  11/12/2015  CLINICAL DATA:  Intermittent left upper quadrant pain for 3 weeks. EXAM: CT ABDOMEN AND PELVIS WITH CONTRAST TECHNIQUE: Multidetector CT imaging of the abdomen and pelvis was performed using the standard protocol following bolus administration of intravenous contrast. CONTRAST:  136mL OMNIPAQUE IOHEXOL 300 MG/ML  SOLN COMPARISON:  11/02/2015 FINDINGS: Lower chest and abdominal wall: Increase in left pleural effusion, small where seen, with new amorphous high density dependently. Bibasilar atelectasis. Ground-glass opacity in the subpleural left lower lobe could be inflammatory or atelectatic Hepatobiliary: Left liver cysts. Layering high density material in the gallbladder which could reflect sludge. No calcified gallstone. Pancreas: Sequela of pancreatitis with more organized peripancreatic collection posterior to the body measuring 42 x 19 mm in axial dimension. This collection has high density center and peripheral low-density rim consistent with hemorrhage. There is a rim enhancing ovoid  collection between the pancreas and small bowel measuring 25 by 13 mm, 2:26. New rounded fluid collection measuring 32 mm in the interloop left peritoneal space. Pelvic predominant ascites with peritoneal enhancement and layering debris. This has a similar appearance to the left pleural findings. There are changes of chronic pancreatitis in the uncinate process. 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 Spleen: Low-density area in the subcapsular lower spleen compatible with interval infarct. Adrenals/Urinary Tract: Negative adrenals. No hydronephrosis or stone. Unremarkable bladder. Reproductive:No pathologic findings. Stomach/Bowel: No obstruction. Gastric wall thickening consider reactive pancreatitis. Vascular/Lymphatic: Chronic left splenic vein occlusion well-formed collaterals. Atherosclerosis with low-density filling defect in the left common iliac artery compatible with thrombus. Related stenosis is moderate. More distally there is a high-grade to moderate external iliac artery stenosis from chronic plaque. Peritoneal: No ascites or pneumoperitoneum. Musculoskeletal: No acute abnormalities. IMPRESSION: 1. Complicated pancreatitis with progression of pseudocysts, 1 with hemorrhagic contents, which are described above. Stable omental edema/fat necrosis. 2. 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 New small splenic infarct. 3. Increased left pleural effusion with layering debris or hemorrhage. 4. Despite interval paracentesis, stablvolume complex ascites. 5. Newly formed thrombus projecting into the left common iliac artery with moderate luminal stenosis. Electronically Signed  By: Monte Fantasia M.D.   On: 11/12/2015 22:05    Myna Freimark Cletis Media, MD 11/13/2015, 9:21 AM PGY-1, [place] Intern pager: [phone], text pages welcome

## 2015-11-13 NOTE — Procedures (Signed)
Small, complex densely loculated left effusion Successful US guided left thoracentesis. Yielded 40mL of cloudy bloody fluid with debris particles. Pt tolerated procedure well. No immediate complications.  Specimen was sent for labs. CXR ordered.  Ascencion Dike PA-C 11/13/2015 2:49 PM

## 2015-11-13 NOTE — Progress Notes (Signed)
ANTICOAGULATION CONSULT NOTE - Follow Up Consult  Pharmacy Consult for heparin Indication: splenic infarct, left common iliac artery thrombus  Allergies  Allergen Reactions  . Azithromycin Anaphylaxis and Swelling    Throat swelling, body swelling  . Lisinopril Anaphylaxis, Hives and Swelling    REACTION: facial/neck edema    Patient Measurements: Height: 5\' 8"  (172.7 cm) Weight: 118 lb 8 oz (53.751 kg) IBW/kg (Calculated) : 68.4 Heparin Dosing Weight: 53.8 kg  Vital Signs: Temp: 97.5 F (36.4 C) (02/03 1621) Temp Source: Oral (02/03 1621) BP: 107/66 mmHg (02/03 1621) Pulse Rate: 113 (02/03 1621)  Labs:  Recent Labs  11/12/15 1702  11/13/15 0706 11/13/15 1033 11/13/15 1829  HGB 9.5*  < > 7.6* 7.3* 8.8*  HCT 31.1*  < > 24.1* 23.6* 28.5*  PLT 602*  < > 543* 593* 648*  LABPROT  --   --   --  16.8*  --   INR  --   --   --  1.35  --   HEPARINUNFRC  --   --  0.33  --  <0.10*  CREATININE 1.06  --  0.97  --   --   < > = values in this interval not displayed.  Estimated Creatinine Clearance: 63.2 mL/min (by C-G formula based on Cr of 0.97).   Medications:  Scheduled:  . diatrizoate meglumine-sodium      . folic acid  1 mg Oral Daily  . levETIRAcetam  1,000 mg Oral BID  . lidocaine (PF)      . lidocaine      . lipase/protease/amylase  12,000 Units Oral TID AC  . multivitamin with minerals  1 tablet Oral Daily  . sertraline  100 mg Oral Daily  . thiamine  100 mg Oral Daily   Or  . thiamine  100 mg Intravenous Daily   Infusions:  . sodium chloride 100 mL/hr at 11/13/15 1325  . feeding supplement (VITAL AF 1.2 CAL)    . heparin 800 Units/hr (11/13/15 1553)    Assessment: 60 yo male with splenic infarct, left common iliac artery thrombus is currently on subtherapeutic heparin.  Heparin level is below 0.1.  Confirmed with RN that nothing is wrong with infusion.  Goal of Therapy:  Heparin level 0.3-0.7 units/ml Monitor platelets by anticoagulation protocol:  Yes   Plan:  - increase heparin to 950 units/hr - 6hr heparin level  Steven Frey, Tsz-Yin 11/13/2015,7:36 PM

## 2015-11-13 NOTE — Care Management Note (Signed)
Case Management Note Marvetta Gibbons RN, BSN Unit 2W-Case Manager [phone]  Patient Details  Name: Steven Frey MRN: [id number] Date of Birth: 12-05-1955  Subjective/Objective:   Pt admitted with pancreatitis, and acute splenic infarct                 Action/Plan: PTA pt was staying with his sister-[name], was active with [facility]- for HH-RN/PT/OT/MSW/aide- will need resumption orders at time of discharge- RN CM will follow  Expected Discharge Date:                  Expected Discharge Plan:  Home/Home Health  In-House Referral:     Discharge planning Services  CM Consult  Post Acute Care Choice:  Home Health, Resumption of Svcs/PTA Provider Choice offered to:  Patient  DME Arranged:    DME Agency:     HH Arranged:    [place] Agency:  [facility]  Status of Service:  In process, will continue to follow  Medicare Important Message Given:    Date Medicare IM Given:    Medicare IM give by:    Date Additional Medicare IM Given:    Additional Medicare Important Message give by:     If discussed at [place] of Stay Meetings, dates discussed:    Additional Comments:  Dawayne Patricia, RN 11/13/2015, 11:28 AM

## 2015-11-13 NOTE — Progress Notes (Signed)
[place] for Heparin  Indication: Splenic infarct, left common iliac artery thrombus  Allergies  Allergen Reactions  . Azithromycin Anaphylaxis and Swelling    Throat swelling, body swelling  . Lisinopril Anaphylaxis, Hives and Swelling    REACTION: facial/neck edema   Vital Signs: Temp: 97.7 F (36.5 C) (02/03 0506) Temp Source: Oral (02/03 0506) BP: 102/57 mmHg (02/03 0506) Pulse Rate: 100 (02/03 0506)  Labs:  Recent Labs  11/12/15 1702 11/13/15 0216 11/13/15 0706  HGB 9.5* 7.4* 7.6*  HCT 31.1* 23.8* 24.1*  PLT 602* 541* 543*  HEPARINUNFRC  --   --  0.33  CREATININE 1.06  --  0.97   Estimated Creatinine Clearance: 63.2 mL/min (by C-G formula based on Cr of 0.97).  Medical History: Past Medical History  Diagnosis Date  . Angioedema     rx requiring intubation/vent support suspected secondary to ACE1 (but also on zithromax) 5/09 hospitalization   . Smoker   . ETOH abuse   . Chronic pancreatitis ([place])     (10/08 hops). admx 02/2010 pseudocyst aspirated during admxn. pancreatic dual stent placement at [facility] 11/11  . Cirrhosis ([place]) 2011    due to ETOH with recent hepatic abcess and possible HCC.   . Gastritis     alcohol induced  . Depression with anxiety     no hx of meds  . VF (ventricular fibrillation) ([place]) 2011    arrest 6/11. successfully rescucitated w/prolinged hospitalization due to respitatory failure. QT prolongation during cooling, now resolved  . History of stroke 2011    reports it was caused by pain from pancreatitis- caused heart to stop and stroke   . 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 Edema   . 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 Anoxic brain damage (H[facility])   . Anemia   . Hypertension 2011  . Arthritis   . Chronic pain 2011  . Hyperlipidemia 2011  . Inguinal hernia 2011  . COPD (chronic obstructive pulmonary disease) ([place])     DOE  . GERD (gastroesophageal reflux  disease)   . Pneumonia     hx of  . Stroke [facility])     right sided weakness; affected long term memory  . Diabetes mellitus without complication ([place])     Type 2, pt reports no longer takes meds & A1C was normal.  . Myocardial infarction ([place])   . Seizures ([place]) 2011    had seizure 11-24-14/went to ED per pt.    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 Started heparin per pharmacy. Noted one pancreatic pseudocyst with some hemorrhagic contents/Hgb 9.5 at initiation.  Initial heparin level  At low end of goal as desired (0.33). Did see a drop in hemoglobin this am 7.6 then recheck was 7.3. Primary indicates all cell lines are down indicating dilutional, will continue to follow closely. No overt bleeding mentioned in rounds this am.  Goal of Therapy:  Heparin level 0.3-0.7 units/ml Monitor platelets by anticoagulation protocol: Yes   Plan:  Continue heparin at 800 units/hr Check confirmatory HL this evening Q4h cbc per primary  Erin Hearing PharmD., BCPS Clinical Pharmacist Pager [phone] 11/13/2015 11:17 AM

## 2015-11-13 NOTE — Progress Notes (Signed)
Cortrak Tube Team Note: Tube team attempted placement of Cortrak tube however unable to pass beyond GE junction.  Notified RN.   [place], [place], [place] Pager [phone] After Hours Pager

## 2015-11-13 NOTE — Consult Note (Signed)
[facility] [place] HISTORY AND PHYSICAL   Referring Physician: Dr Emmaline Life History of Present Illness:  Patient is a 60 y.o. male was admitted with severe pancreatitis.  On CT scan last evening he was found to have incidental thrombus in his left iliac artery. I was called this afternoon to evaluate pt for indication for heparin or other intervention.  He denies pain in the left foot.  He denies numbness tingling or temperature change.  He is not ambulatory enough to judge if he has claudication. He has no history of Afib. Other medical problems include prior stroke with right side weakness, hypertension, hyperlipidemia, COPD, chronic pancreatitis and pain all of which have been stable except for the pancreatitis which has kept him in the hospital on and off for 3 months.   Past Medical History  Diagnosis Date  . Angioedema     rx requiring intubation/vent support suspected secondary to ACE1 (but also on zithromax) 5/09 hospitalization   . Smoker   . ETOH abuse   . Chronic pancreatitis ([place])     (10/08 hops). admx 02/2010 pseudocyst aspirated during admxn. pancreatic dual stent placement at [facility] 11/11  . Cirrhosis ([place]) 2011    due to ETOH with recent hepatic abcess and possible HCC.   . Gastritis     alcohol induced  . Depression with anxiety     no hx of meds  . VF (ventricular fibrillation) ([place]) 2011    arrest 6/11. successfully rescucitated w/prolinged hospitalization due to respitatory failure. QT prolongation during cooling, now resolved  . History of stroke 2011    reports it was caused by pain from pancreatitis- caused heart to stop and stroke   . 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 Edema   . 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 Anoxic brain damage (HCC)   . Anemia   . Hypertension 2011  . Arthritis   . Chronic pain 2011  . Hyperlipidemia 2011  . Inguinal hernia 2011  . COPD (chronic obstructive pulmonary disease) ([place])      DOE  . GERD (gastroesophageal reflux disease)   . Pneumonia     hx of  . Stroke [facility])     right sided weakness; affected long term memory  . Diabetes mellitus without complication ([place])     Type 2, pt reports no longer takes meds & A1C was normal.  . Myocardial infarction ([place])   . Seizures ([place]) 2011    had seizure 11-24-14/went to ED per pt.     Past Surgical History  Procedure Laterality Date  . Right wrist orif  1976    for fx  . Pancreatic stent placement    . Inguinal hernia repair  1960    right  . Inguinal hernia repair Left 07/12/2013    Procedure: HERNIA REPAIR INGUINAL ADULT;  Surgeon: Adin Hector, MD;  Location: [place];  Service: General;  Laterality: Left;  . Insertion of mesh Left 07/12/2013    Procedure: INSERTION OF MESH;  Surgeon: Adin Hector, MD;  Location: [place];  Service: General;  Laterality: Left;    Social History Social History  Substance Use Topics  . Smoking status: Current Every Day Smoker -- 0.25 packs/day    Types: Cigarettes  . Smokeless tobacco: Never Used  . Alcohol Use: 0.0 oz/week    0 Standard drinks or equivalent per week     Comment: hx of alcohol abuse, every 2-3 weeks beer on  and off     Family History Family History  Problem Relation Age of Onset  . Coronary artery disease Neg Hx   . Colon cancer Neg Hx   . Alcohol abuse Mother   . Alcohol abuse Father   . Alcohol abuse    . Early death Mother   . Diabetes Sister   . Hyperlipidemia Sister   . Hypertension Sister   . Prostate cancer Father 36  . Breast cancer Paternal Grandmother     Allergies  Allergies  Allergen Reactions  . Azithromycin Anaphylaxis and Swelling    Throat swelling, body swelling  . Lisinopril Anaphylaxis, Hives and Swelling    REACTION: facial/neck edema     Current Facility-Administered Medications  Medication Dose Route Frequency Provider Last Rate Last Dose  . 0.9 %  sodium chloride infusion   Intravenous Continuous Verner Mould, MD 100 mL/hr at 11/13/15 1325    . feeding supplement (VITAL AF 1.2 CAL) liquid 1,000 mL  1,000 mL Per Tube Continuous Lind Covert, MD      . fentaNYL (SUBLIMAZE) injection 25 mcg  25 mcg Intravenous Q2H PRN Carlyle Dolly, MD   25 mcg at 11/13/15 1324  . folic acid (FOLVITE) tablet 1 mg  1 mg Oral Daily Carlyle Dolly, MD   1 mg at 11/13/15 1036  . heparin ADULT infusion 100 units/mL (25000 units/250 mL)  800 Units/hr Intravenous Continuous Lyndee Leo, RPH 8 mL/hr at 11/13/15 1553 800 Units/hr at 11/13/15 1553  . levETIRAcetam (KEPPRA) tablet 1,000 mg  1,000 mg Oral BID Mariel Aloe, MD   1,000 mg at 11/13/15 1036  . lidocaine (PF) (XYLOCAINE) 1 % injection           . lipase/protease/amylase (CREON) capsule 12,000 Units  12,000 Units Oral TID AC Mariel Aloe, MD   12,000 Units at 11/13/15 1036  . LORazepam (ATIVAN) tablet 1 mg  1 mg Oral Q6H PRN Carlyle Dolly, MD   1 mg at 11/13/15 1035   Or  . LORazepam (ATIVAN) injection 1 mg  1 mg Intravenous Q6H PRN Carlyle Dolly, MD      . multivitamin with minerals tablet 1 tablet  1 tablet Oral Daily Carlyle Dolly, MD   1 tablet at 11/13/15 1035  . promethazine (PHENERGAN) injection 12.5 mg  12.5 mg Intravenous Q6H PRN Mariel Aloe, MD      . sertraline (ZOLOFT) tablet 100 mg  100 mg Oral Daily Lind Covert, MD   100 mg at 11/13/15 1035  . thiamine (VITAMIN B-1) tablet 100 mg  100 mg Oral Daily Carlyle Dolly, MD   100 mg at 11/12/15 [id number]   Or  . thiamine (B-1) injection 100 mg  100 mg Intravenous Daily Carlyle Dolly, MD   100 mg at 11/13/15 1029    ROS:   General:  No weight loss, Fever, chills  HEENT: No recent headaches, no nasal bleeding, no visual changes, no sore throat  Neurologic: No dizziness, blackouts, seizures. No recent symptoms of stroke or mini- stroke. No recent episodes of slurred speech, or temporary blindness.  Cardiac: No recent episodes of chest  pain/pressure, no shortness of breath at rest.  + shortness of breath with exertion.  Denies history of atrial fibrillation or irregular heartbeat, vfib arrest per history  Vascular: No history of rest pain in feet.  No history of claudication.  No history of non-healing ulcer, No history of DVT  Pulmonary: No home oxygen, no productive cough, no hemoptysis,  No asthma or wheezing  Musculoskeletal:  [ ]  Arthritis, [ ]  Low back pain,  [ ]  Joint pain  Hematologic:No history of hypercoagulable state.  No history of easy bleeding.  + history of anemia  Gastrointestinal: No hematochezia or melena,  + gastroesophageal reflux, no trouble swallowing  Urinary: [ ]  chronic Kidney disease, [ ]  on HD - [ ]  MWF or [ ]  TTHS, [ ]  Burning with urination, [ ]  Frequent urination, [ ]  Difficulty urinating;   Skin: No rashes  Psychological:no history of anxiety,  No history of depression   Physical Examination  Filed Vitals:   11/12/15 1645 11/12/15 2008 11/13/15 0506 11/13/15 0543  BP: 100/69 103/70 102/57   Pulse: 115 111 100   Temp: 98.6 F (37 C) 97.7 F (36.5 C) 97.7 F (36.5 C)   TempSrc: Axillary Oral Oral   Resp: 18 18 18    Height:    5\' 8"  (1.727 m)  Weight:    118 lb 8 oz (53.751 kg)  SpO2: 97% 97% 97%     Body mass index is 18.02 kg/(m^2).  General:  Alert and oriented, no acute distress, ill appearing HEENT: Normal Neck: No JVD Cardiac: Regular Rate and Rhythm Abdomen: Soft, non-tender, non-distended, no mass, diffusely tender more in epigastrium Skin: No rash, both feet symmetrically warm Extremity Pulses:  2+ radial, brachial, femoral  Bilaterally, absent dorsalis pedis pulse bilaterally biphasic DP doppler, 2+ right posterior tibial pulse, absent left posterior tibial pulse biphasic posterior tibial doppler signal bilaterally Musculoskeletal: No deformity or edema  Neurologic: Upper and lower extremity motor 5/5 on left, right hand deformed with 3/5 motor right arm and right  leg  DATA:  CT abdomen images reviewed, pancreatic fluid collections and ascites. Aortoiliac calcification, thrombus left proximal CIA obstructs 50-70% of lumen   ASSESSMENT:  Recent development of thrombus left common iliac artery currently asymptomatic, has symmetric normal femoral pulse, has doppler signals left foot and most likely chronic SFA occlusion since feet are symmetrically warm.  He is anemic but has been for some time.  Would trend hemoglobin but as long as he does not appear to be actively bleeding would continue heparin for now.  If he develops ischemia left foot would consider embolectomy but would like to avoid any operation currently in the face of his severe pancreatitis.   Will follow, continue heparin for now   Ruta Hinds, MD [facility] [place] Office: [phone] Pager: [phone]

## 2015-11-14 ENCOUNTER — Encounter (HOSPITAL_COMMUNITY): Payer: Self-pay | Admitting: *Deleted

## 2015-11-14 DIAGNOSIS — R1010 Upper abdominal pain, unspecified: Secondary | ICD-10-CM

## 2015-11-14 LAB — BASIC METABOLIC PANEL
Anion gap: 12 (ref 5–15)
BUN: 7 mg/dL (ref 6–20)
CO2: 22 mmol/L (ref 22–32)
Calcium: 8 mg/dL — ABNORMAL LOW (ref 8.9–10.3)
Chloride: 107 mmol/L (ref 101–111)
Creatinine, Ser: 0.65 mg/dL (ref 0.61–1.24)
GFR calc Af Amer: >60 mL/min (ref >60)
GFR calc non Af Amer: >60 mL/min (ref >60)
Glucose, Bld: 86 mg/dL (ref 65–99)
Potassium: 3.9 mmol/L (ref 3.5–5.1)
Sodium: 141 mmol/L (ref 135–145)

## 2015-11-14 LAB — CBC
HCT: 22 % — ABNORMAL LOW (ref 39.0–52.0)
HCT: 31.6 % — ABNORMAL LOW (ref 39.0–52.0)
Hemoglobin: 6.8 g/dL — CL (ref 13.0–17.0)
Hemoglobin: 10.1 g/dL — ABNORMAL LOW (ref 13.0–17.0)
MCH: 27.5 pg (ref 26.0–34.0)
MCH: 28.1 pg (ref 26.0–34.0)
MCHC: 30.9 g/dL (ref 30.0–36.0)
MCHC: 32 g/dL (ref 30.0–36.0)
MCV: 89.1 fL (ref 78.0–100.0)
MCV: 87.8 fL (ref 78.0–100.0)
Platelets: 554 10*3/uL — ABNORMAL HIGH (ref 150–400)
Platelets: 505 10*3/uL — ABNORMAL HIGH (ref 150–400)
RBC: 2.47 MIL/uL — ABNORMAL LOW (ref 4.22–5.81)
RBC: 3.6 MIL/uL — ABNORMAL LOW (ref 4.22–5.81)
RDW: 17.7 % — ABNORMAL HIGH (ref 11.5–15.5)
RDW: 16.7 % — ABNORMAL HIGH (ref 11.5–15.5)
WBC: 13 10*3/uL — ABNORMAL HIGH (ref 4.0–10.5)
WBC: 12.9 10*3/uL — ABNORMAL HIGH (ref 4.0–10.5)

## 2015-11-14 LAB — PHOSPHORUS: Phosphorus: 3 mg/dL (ref 2.5–4.6)

## 2015-11-14 LAB — AMYLASE, PLEURAL FLUID: Amylase, Pleural Fluid: 6544 U/L

## 2015-11-14 LAB — HEPARIN LEVEL (UNFRACTIONATED)
Heparin Unfractionated: <0.1 IU/mL — ABNORMAL LOW (ref 0.30–0.70)
Heparin Unfractionated: 0.15 IU/mL — ABNORMAL LOW (ref 0.30–0.70)

## 2015-11-14 LAB — MAGNESIUM: Magnesium: 1.5 mg/dL — ABNORMAL LOW (ref 1.7–2.4)

## 2015-11-14 LAB — PREPARE RBC (CROSSMATCH)

## 2015-11-14 LAB — HAPTOGLOBIN: Haptoglobin: 367 mg/dL — ABNORMAL HIGH (ref 34–200)

## 2015-11-14 LAB — GLUCOSE, CAPILLARY: Glucose-Capillary: 90 mg/dL (ref 65–99)

## 2015-11-14 MED ORDER — SODIUM CHLORIDE 0.9 % IV SOLN
Freq: Once | INTRAVENOUS | Status: AC
  Administered 2015-11-14: 05:00:00 via INTRAVENOUS

## 2015-11-14 MED ORDER — MAGNESIUM SULFATE 2 GM/50ML IV SOLN
2.0000 g | Freq: Once | INTRAVENOUS | Status: AC
  Administered 2015-11-14: 2 g via INTRAVENOUS
  Filled 2015-11-14: qty 50

## 2015-11-14 NOTE — Progress Notes (Signed)
Cross [facility] Subjective: Patient seen and examined. Chart reviewed. Patient complains of diffuse abdominal pain with minimal nausea since receiving sips by mouth Dobbhoff has not been placed in feedings have not been started yet. No fever chills or rigors.   Objective: Vital signs in last 24 hours: Temp:  [97.5 F (36.4 C)-98.8 F (37.1 C)] 98.8 F (37.1 C) (02/04 0951) Pulse Rate:  [97-114] 100 (02/04 0951) Resp:  [14-20] 18 (02/04 0951) BP: (98-110)/(62-76) 105/62 mmHg (02/04 0951) SpO2:  [94 %-100 %] 98 % (02/04 0951) Weight:  [54.613 kg (120 lb 6.4 oz)] 54.613 kg (120 lb 6.4 oz) (02/04 0500) Last BM Date: 11/13/15  Intake/Output from previous day: 02/03 0701 - 02/04 0700 In: 335 [Blood:335] Out: 400 [Urine:400] Intake/Output this shift: Total I/O In: 380 [Blood:380] Out: -   General appearance: alert, cooperative, appears older than stated age, fatigued and mild distress Resp: clear to auscultation bilaterally Cardio: regular rate and rhythm, S1, S2 normal, no murmur, click, rub or gallop GI: soft, slightly distended, diffusely tender; hypoactive bowel sounds; no masses,  no organomegaly  Lab Results:  Recent Labs  11/13/15 1829 11/13/15 2150 11/14/15 0217  WBC 16.3* 16.5* 13.0*  HGB 8.8* 7.8* 6.8*  HCT 28.5* 25.2* 22.0*  PLT 648* PLATELET CLUMPS NOTED ON SMEAR, COUNT APPEARS INCREASED 554*   BMET  Recent Labs  11/12/15 1702 11/13/15 0706  NA 140 139  K 4.8 4.0  CL 102 102  CO2 22 24  GLUCOSE 124* 91  BUN 15 13  CREATININE 1.06 0.97  CALCIUM 8.8* 8.4*   LFT  Recent Labs  11/13/15 0706  PROT 5.7*  ALBUMIN 2.1*  AST 17  ALT 9*  ALKPHOS 58  BILITOT 0.7   PT/INR  Recent Labs  11/13/15 1033  LABPROT 16.8*  INR 1.35   Studies/Results: Dg Chest 1 View  11/13/2015  CLINICAL DATA:  Status post left thoracentesis EXAM: CHEST 1 VIEW COMPARISON:  11/02/2015 FINDINGS: Cardiac shadow is stable. Small left-sided pleural effusion remains. No  pneumothorax is noted following thoracentesis. Mild left basilar atelectasis is noted. IMPRESSION: No evidence of pneumothorax following thoracentesis. Small left pleural effusion with basilar atelectasis. Electronically Signed   By: Inez Catalina M.D.   On: 11/13/2015 15:03   Ct Abdomen Pelvis W Contrast  11/12/2015  CLINICAL DATA:  Intermittent left upper quadrant pain for 3 weeks. EXAM: CT ABDOMEN AND PELVIS WITH CONTRAST TECHNIQUE: Multidetector CT imaging of the abdomen and pelvis was performed using the standard protocol following bolus administration of intravenous contrast. CONTRAST:  131mL OMNIPAQUE IOHEXOL 300 MG/ML  SOLN COMPARISON:  11/02/2015 FINDINGS: Lower chest and abdominal wall: Increase in left pleural effusion, small where seen, with new amorphous high density dependently. Bibasilar atelectasis. Ground-glass opacity in the subpleural left lower lobe could be inflammatory or atelectatic Hepatobiliary: Left liver cysts. Layering high density material in the gallbladder which could reflect sludge. No calcified gallstone. Pancreas: Sequela of pancreatitis with more organized peripancreatic collection posterior to the body measuring 42 x 19 mm in axial dimension. This collection has high density center and peripheral low-density rim consistent with hemorrhage. There is a rim enhancing ovoid collection between the pancreas and small bowel measuring 25 by 13 mm, 2:26. New rounded fluid collection measuring 32 mm in the interloop left peritoneal space. Pelvic predominant ascites with peritoneal enhancement and layering debris. This has a similar appearance to the left pleural findings. There are changes of chronic pancreatitis in the uncinate process. Chronic splenic vein occlusion  with well-formed collaterals in the omentum. Spleen: Low-density area in the subcapsular lower spleen compatible with interval infarct. Adrenals/Urinary Tract: Negative adrenals. No hydronephrosis or stone. Unremarkable  bladder. Reproductive:No pathologic findings. Stomach/Bowel: No obstruction. Gastric wall thickening consider reactive pancreatitis. Vascular/Lymphatic: Chronic left splenic vein occlusion well-formed collaterals. Atherosclerosis with low-density filling defect in the left common iliac artery compatible with thrombus. Related stenosis is moderate. More distally there is a high-grade to moderate external iliac artery stenosis from chronic plaque. Peritoneal: No ascites or pneumoperitoneum. Musculoskeletal: No acute abnormalities. IMPRESSION: 1. Complicated pancreatitis with progression of pseudocysts, 1 with hemorrhagic contents, which are described above. Stable omental edema/fat necrosis. 2. 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 New small splenic infarct. 3. Increased left pleural effusion with layering debris or hemorrhage. 4. Despite interval paracentesis, stablvolume complex ascites. 5. Newly formed thrombus projecting into the left common iliac artery with moderate luminal stenosis. Electronically Signed   By: Monte Fantasia M.D.   On: 11/12/2015 22:05   Dg Loyce Dys Tube Plc W/fl-no Rad  11/13/2015  CLINICAL DATA:  NASO G TUBE PLACEMENT WITH FLUORO Fluoroscopy was utilized by the requesting physician.  No radiographic interpretation.   US Thoracentesis Asp Pleural Space W/img Guide  11/13/2015  INDICATION: Pancreatitis, cirrhosis. Small left pleural effusion noted on recent CT imaging of the abdomen. Request diagnostic left thoracentesis. EXAM: ULTRASOUND GUIDED LEFT THORACENTESIS MEDICATIONS: None. COMPLICATIONS: None immediate. PROCEDURE: An ultrasound guided thoracentesis was thoroughly discussed with the patient and questions answered. The benefits, risks, alternatives and complications were also discussed. The patient understands and wishes to proceed with the procedure. Written consent was obtained. Ultrasound was performed and finds small, complex, densely loculated pleural  effusion. An adequate pocket of fluid was identified in the left chest. The area was then prepped and draped in the normal sterile fashion. 1% Lidocaine was used for local anesthesia. Under ultrasound guidance a 19 gauge, 7 cm, Yueh catheter was introduced. Thoracentesis was performed. The catheter was removed and a dressing applied. FINDINGS: A total of approximately 60 mL of cloudy, bloody fluid was removed. Samples were sent to the laboratory as requested by the clinical team. IMPRESSION: Successful ultrasound guided left thoracentesis yielding 60 mL of pleural fluid. Read by: Ascencion Dike PA-C Electronically Signed   By: Jacqulynn Cadet M.D.   On: 11/13/2015 14:55   Medications: I have reviewed the patient's current medications.  Assessment/Plan: 1) Complicated acute on chronic alcoholic pancreatitis with pseudocysts and ascites - worsening noted by CT scan. Tube feeds planned as outlined by FPTS. Bowel rest advised.   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 6) Thoracentesis-drained recently. 7) Severe anemia-monitor serial CBC's. 8) S/P CVA-FTT.   LOS: 2 days   Larz Mark 11/14/2015, 10:56 AM

## 2015-11-14 NOTE — Progress Notes (Signed)
Family Medicine Teaching Service Daily Progress Note Intern Pager: [phone]  Patient name: Steven Frey Medical record number: [id number] Date of birth: 1956/01/27 Age: 60 y.o. Gender: male  Primary Care Provider: Chrisandra Netters, MD Consultants: GI and Surgery  Code Status: Full   Pt Overview and Major Events to Date:  2/2: admitted for abdominal pain, thrombosis of left common iliac 2/3: thoracentesis performed   ABX/Culture Body culture 2/3  Assessment and Plan: Steven Frey. is a 60 y.o. male presenting with acute on chronic pancreatitis. PMH is significant for chronic pancreatitis, alcohol use d/o, cirrhosis, hypertension, CVA, CAD, seizure d/o, anemia and depression.  #Acute on chronic Anemia: Hgb 6.8 today. Chronically associated with his EtOH abuse  - LDH, blood smear, and haptoglobin  - 2 U PRBC's  #Acute on chronic pancreatitis: Dobhoff tube unable to be placed  - may need to consider TPN if having to remain NPO - GI: needs pancreatic rest - NPO with ice chips and  NS @ 50 cc as he's getting 2 U PRBC's   - IV fentanyl 25 mcg q2h as needed severe pain - Creon - Phenergan Q6 PRN  #Thrombosis of Left Common Iliac and Moderate Stenosis of Lumen: stable - heparin gtt - Vascular rec's: continue heparin, consider embolectomy if he develops ischemia of left foot.   #Pleural Effusion: thoracentesis yielded 60 mL of fluid  - labs pending  #Protein-Calorie malnutrition, Severe: About 40 lbs weight loss in one year. Likely from pancreatitis, chronic alcohol abuse, poor appetite.  - Nutrition consult   - magnesium and phosphorus    #Cirrhosis/Ascites: secondary to chronic alcohol abuse.Child-Pugh Classification Grade A, MELD Score 9  - holding spironolactone and lasix - consider ammonia   - if develops fever then broad coverage for SBP  #Alcohol abuse:  Mental status is clear today. There has been concern for Wernicke's encephalopathy.   - CIWA for EtOH use   -  Thiamine 100 mg daily   #H/o CVA, MI in 2011:  Last stress test in 2014 and last Echo in 2016 within normal. His exam with contractures in his right arm and left sided weakness. Has tried botox in the past but has trouble with transportation    - Holding his statin, consider re-starting  Hypertension: stable.  Holding amlodipine 5 mg anc coreg 3.125 mg twice a day  Depression: stable. holding home zoloft  Seizure disorder: stable. Keppra 1000 mg twice a day starting now  FEN/GI: NS @ 50 cc/hr, NPO Prophylaxis: Heparin gtt  Disposition: pending improvement    Subjective:  Doing well today with no complaints.  Reports pain is 6/10 and has baseline pain of 7/10.   Objective: Temp:  [97.5 F (36.4 C)-98.3 F (36.8 C)] 97.9 F (36.6 C) (02/04 0730) Pulse Rate:  [97-114] 99 (02/04 0730) Resp:  [14-20] 20 (02/04 0730) BP: (98-110)/(64-76) 101/67 mmHg (02/04 0730) SpO2:  [94 %-100 %] 97 % (02/04 0730) Weight:  [120 lb 6.4 oz (54.613 kg)] 120 lb 6.4 oz (54.613 kg) (02/04 0500) Physical Exam: General: NAD, alert and orientated Cardiovascular: RRR, no murmurs, rubs or gallops  Respiratory: CTAB, no wheezes noted  Abdomen: hyperactive BS, guarding, epigastric tenderness as well as tenderness throughout abdomen, negative peritoneal however increased pain with intraabdominal pressure  Extremities: 2+ TP bilateral, both legs warm, and no swelling noted.   Laboratory:  Recent Labs Lab 11/13/15 1829 11/13/15 2150 11/14/15 0217  WBC 16.3* 16.5* 13.0*  HGB 8.8* 7.8* 6.8*  HCT 28.5* 25.2* 22.0*  PLT 648* PLATELET CLUMPS NOTED ON SMEAR, COUNT APPEARS INCREASED 554*    Recent Labs Lab 11/12/15 1702 11/13/15 0706  NA 140 139  K 4.8 4.0  CL 102 102  CO2 22 24  BUN 15 13  CREATININE 1.06 0.97  CALCIUM 8.8* 8.4*  PROT 6.3* 5.7*  BILITOT 0.6 0.7  ALKPHOS 74 58  ALT 10* 9*  AST 21 17  GLUCOSE 124* 91    Imaging/Diagnostic Tests: Dg Chest 1 View  11/13/2015  CLINICAL DATA:   Status post left thoracentesis EXAM: CHEST 1 VIEW COMPARISON:  11/02/2015 FINDINGS: Cardiac shadow is stable. Small left-sided pleural effusion remains. No pneumothorax is noted following thoracentesis. Mild left basilar atelectasis is noted. IMPRESSION: No evidence of pneumothorax following thoracentesis. Small left pleural effusion with basilar atelectasis. Electronically Signed   By: Inez Catalina M.D.   On: 11/13/2015 15:03   Ct Abdomen Pelvis W Contrast  11/12/2015  CLINICAL DATA:  Intermittent left upper quadrant pain for 3 weeks. EXAM: CT ABDOMEN AND PELVIS WITH CONTRAST TECHNIQUE: Multidetector CT imaging of the abdomen and pelvis was performed using the standard protocol following bolus administration of intravenous contrast. CONTRAST:  180mL OMNIPAQUE IOHEXOL 300 MG/ML  SOLN COMPARISON:  11/02/2015 FINDINGS: Lower chest and abdominal wall: Increase in left pleural effusion, small where seen, with new amorphous high density dependently. Bibasilar atelectasis. Ground-glass opacity in the subpleural left lower lobe could be inflammatory or atelectatic Hepatobiliary: Left liver cysts. Layering high density material in the gallbladder which could reflect sludge. No calcified gallstone. Pancreas: Sequela of pancreatitis with more organized peripancreatic collection posterior to the body measuring 42 x 19 mm in axial dimension. This collection has high density center and peripheral low-density rim consistent with hemorrhage. There is a rim enhancing ovoid collection between the pancreas and small bowel measuring 25 by 13 mm, 2:26. New rounded fluid collection measuring 32 mm in the interloop left peritoneal space. Pelvic predominant ascites with peritoneal enhancement and layering debris. This has a similar appearance to the left pleural findings. There are changes of chronic pancreatitis in the uncinate process. 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 Spleen: Low-density area  in the subcapsular lower spleen compatible with interval infarct. Adrenals/Urinary Tract: Negative adrenals. No hydronephrosis or stone. Unremarkable bladder. Reproductive:No pathologic findings. Stomach/Bowel: No obstruction. Gastric wall thickening consider reactive pancreatitis. Vascular/Lymphatic: Chronic left splenic vein occlusion well-formed collaterals. Atherosclerosis with low-density filling defect in the left common iliac artery compatible with thrombus. Related stenosis is moderate. More distally there is a high-grade to moderate external iliac artery stenosis from chronic plaque. Peritoneal: No ascites or pneumoperitoneum. Musculoskeletal: No acute abnormalities. IMPRESSION: 1. Complicated pancreatitis with progression of pseudocysts, 1 with hemorrhagic contents, which are described above. Stable omental edema/fat necrosis. 2. 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 New small splenic infarct. 3. Increased left pleural effusion with layering debris or hemorrhage. 4. Despite interval paracentesis, stablvolume complex ascites. 5. Newly formed thrombus projecting into the left common iliac artery with moderate luminal stenosis. Electronically Signed   By: Monte Fantasia M.D.   On: 11/12/2015 22:05   Dg Loyce Dys Tube Plc W/fl-no Rad  11/13/2015  CLINICAL DATA:  NASO G TUBE PLACEMENT WITH FLUORO Fluoroscopy was utilized by the requesting physician.  No radiographic interpretation.   US Thoracentesis Asp Pleural Space W/img Guide  11/13/2015  INDICATION: Pancreatitis, cirrhosis. Small left pleural effusion noted on recent CT imaging of the abdomen. Request diagnostic left thoracentesis. EXAM: ULTRASOUND GUIDED LEFT THORACENTESIS  MEDICATIONS: None. COMPLICATIONS: None immediate. PROCEDURE: An ultrasound guided thoracentesis was thoroughly discussed with the patient and questions answered. The benefits, risks, alternatives and complications were also discussed. The patient understands and  wishes to proceed with the procedure. Written consent was obtained. Ultrasound was performed and finds small, complex, densely loculated pleural effusion. An adequate pocket of fluid was identified in the left chest. The area was then prepped and draped in the normal sterile fashion. 1% Lidocaine was used for local anesthesia. Under ultrasound guidance a 19 gauge, 7 cm, Yueh catheter was introduced. Thoracentesis was performed. The catheter was removed and a dressing applied. FINDINGS: A total of approximately 60 mL of cloudy, bloody fluid was removed. Samples were sent to the laboratory as requested by the clinical team. IMPRESSION: Successful ultrasound guided left thoracentesis yielding 60 mL of pleural fluid. Read by: Ascencion Dike PA-C Electronically Signed   By: Jacqulynn Cadet M.D.   On: 11/13/2015 14:55    Rosemarie Ax, MD 11/14/2015, 8:05 AM PGY-3, [facility] Intern pager: [phone], text pages welcome

## 2015-11-14 NOTE — Consult Note (Signed)
[facility]  Subjective  - still with similar abdominal pain, no pain in left foot or numbness, chronic numbness left foot   Objective 101/67 99 97.9 F (36.6 C) (Oral) 20 97%  Intake/Output Summary (Last 24 hours) at 11/14/15 0946 Last data filed at 11/14/15 0900  Gross per 24 hour  Intake    335 ml  Output    400 ml  Net    -65 ml   2+ left femoral and popliteal pulse, doppler PT DP left 2+ PT pulse right foot  Assessment/Planning: Continue heparin for now no evidence of ischemia left foot Receiving transfusion today most likely secondary to chronic issues rather than acute bleed continue to follow hemoglobin.  If develops evidence of ongoing bleeding would most likely rescan to check if thrombus has resolved to weigh in on anticoagulation.  Steven Frey 11/14/2015 9:46 AM --  Laboratory Lab Results:  Recent Labs  11/13/15 2150 11/14/15 0217  WBC 16.5* 13.0*  HGB 7.8* 6.8*  HCT 25.2* 22.0*  PLT PLATELET CLUMPS NOTED ON SMEAR, COUNT APPEARS INCREASED 554*   BMET  Recent Labs  11/12/15 1702 11/13/15 0706  NA 140 139  K 4.8 4.0  CL 102 102  CO2 22 24  GLUCOSE 124* 91  BUN 15 13  CREATININE 1.06 0.97  CALCIUM 8.8* 8.4*    COAG Lab Results  Component Value Date   INR 1.35 11/13/2015   INR 1.23 11/02/2015   INR 1.07 12/26/2014   No results found for: PTT

## 2015-11-14 NOTE — Progress Notes (Signed)
Cortrak team unable to guide tube past GE junction yesterday. Pt taken to [facility]IR who were also unsuccessful in placing tube. Spoke with Family Medicine MD who reports that currently TPN is anticipated given that both services unable to place tube. Will d/c Cortrak Consult  Steven Frey RD, LDN Nutrition Pager: [phone] 11/14/2015 4:25 PM

## 2015-11-14 NOTE — Progress Notes (Signed)
[facility] for heparin Indication: splenic infarct, left common iliac artery thrombus  Allergies  Allergen Reactions  . Azithromycin Anaphylaxis and Swelling    Throat swelling, body swelling  . Lisinopril Anaphylaxis, Hives and Swelling    REACTION: facial/neck edema    Patient Measurements: Height: 5\' 8"  (172.7 cm) Weight: 118 lb 8 oz (53.751 kg) IBW/kg (Calculated) : 68.4 Heparin Dosing Weight: 53.8 kg  Vital Signs: Temp: 98 F (36.7 C) (02/03 2046) Temp Source: Oral (02/03 2046) BP: 110/64 mmHg (02/03 2046) Pulse Rate: 114 (02/03 2046)  Labs:  Recent Labs  11/12/15 1702  11/13/15 0706 11/13/15 1033 11/13/15 1829 11/13/15 2150 11/14/15 0217  HGB 9.5*  < > 7.6* 7.3* 8.8* 7.8* 6.8*  HCT 31.1*  < > 24.1* 23.6* 28.5* 25.2* 22.0*  PLT 602*  < > 543* 593* 648* PLATELET CLUMPS NOTED ON SMEAR, COUNT APPEARS INCREASED 554*  LABPROT  --   --   --  16.8*  --   --   --   INR  --   --   --  1.35  --   --   --   HEPARINUNFRC  --   --  0.33  --  <0.10*  --  <0.10*  CREATININE 1.06  --  0.97  --   --   --   --   < > = values in this interval not displayed.  Estimated Creatinine Clearance: 63.2 mL/min (by C-G formula based on Cr of 0.97).  Assessment: 60 yo male with left common iliac artery thrombus for heparin.  Noted hemoglobin drop and orders for pRBC.    Goal of Therapy:  Heparin level 0.3-0.7 units/ml Monitor platelets by anticoagulation protocol: Yes   Plan:  Increase Heparin 1100 units/hr Check heparin level in 8 hours.  Caryl Pina 11/14/2015,3:45 AM

## 2015-11-14 NOTE — Progress Notes (Signed)
[facility] for heparin Indication: splenic infarct, left common iliac artery thrombus  Allergies  Allergen Reactions  . Azithromycin Anaphylaxis and Swelling    Throat swelling, body swelling  . Lisinopril Anaphylaxis, Hives and Swelling    REACTION: facial/neck edema    Patient Measurements: Height: 5\' 8"  (172.7 cm) Weight: 120 lb 6.4 oz (54.613 kg) IBW/kg (Calculated) : 68.4 Heparin Dosing Weight: 53.8 kg  Vital Signs: Temp: 97.3 F (36.3 C) (02/04 1428) Temp Source: Oral (02/04 1428) BP: 120/73 mmHg (02/04 1428) Pulse Rate: 100 (02/04 1428)  Labs:  Recent Labs  11/12/15 1702  11/13/15 0706 11/13/15 1033 11/13/15 1829 11/13/15 2150 11/14/15 0217 11/14/15 1607  HGB 9.5*  < > 7.6* 7.3* 8.8* 7.8* 6.8* 10.1*  HCT 31.1*  < > 24.1* 23.6* 28.5* 25.2* 22.0* 31.6*  PLT 602*  < > 543* 593* 648* PLATELET CLUMPS NOTED ON SMEAR, COUNT APPEARS INCREASED 554* 505*  LABPROT  --   --   --  16.8*  --   --   --   --   INR  --   --   --  1.35  --   --   --   --   HEPARINUNFRC  --   < > 0.33  --  <0.10*  --  <0.10* 0.15*  CREATININE 1.06  --  0.97  --   --   --   --  0.65  < > = values in this interval not displayed.  Estimated Creatinine Clearance: 77.7 mL/min (by C-G formula based on Cr of 0.65).  Assessment: 60 yo male with left common iliac artery thrombus for heparin.    HL low this afternoon after rate change, no overt bleeding per RN. Hgb now up to 10.1 after 2 units given.  Goal of Therapy:  Heparin level 0.3-0.7 units/ml Monitor platelets by anticoagulation protocol: Yes   Plan:  Increase Heparin 1300 units/hr Check heparin level in 8 hours.  Erin Hearing PharmD., BCPS Clinical Pharmacist Pager [phone] 11/14/2015 5:09 PM

## 2015-11-14 NOTE — Progress Notes (Signed)
PARENTERAL NUTRITION CONSULT NOTE - INITIAL  Pharmacy Consult:  TPN Indication:  Severe pancreatitis and unable to place feeding tube   Assessment: 64 YOM presented on 11/12/15 with abdominal pain from acute on chronic pancreatitis.  Cortrak team and IR were unable to place feeding tube and Pharmacy consulted to initiate TPN for nutritional support in patient with severe malnutrition.  Per documentation, patient reports having a poor appetite for the last month and lost approximately 20% of his weight in less than a year.   Plan: - F/U central access and start TPN tomorrow - CMET, Mag and Phos in AM    Roza Creamer D. Mina Marble, PharmD, BCPS Pager:  [phone] 11/14/2015, 5:12 PM

## 2015-11-14 NOTE — Progress Notes (Signed)
CRITICAL VALUE ALERT  Critical value received:  hgb 6.8  Date of notification:  11/14/2015  Time of notification:  2:44 am  Critical value read back: Yes  Nurse who received alert:  Candise Che, RN  MD notified (1st page):  Dr. Juanito Doom  Time of first page:  2:44 am  MD notified (2nd page):  Time of second page:  Responding MD:  Dr. Juanito Doom  Time MD responded:  3:13 am

## 2015-11-15 DIAGNOSIS — K860 Alcohol-induced chronic pancreatitis: Secondary | ICD-10-CM

## 2015-11-15 LAB — COMPREHENSIVE METABOLIC PANEL
ALT: 13 U/L — ABNORMAL LOW (ref 17–63)
AST: 32 U/L (ref 15–41)
Albumin: 1.7 g/dL — ABNORMAL LOW (ref 3.5–5.0)
Alkaline Phosphatase: 50 U/L (ref 38–126)
Anion gap: 12 (ref 5–15)
BUN: 7 mg/dL (ref 6–20)
CO2: 19 mmol/L — ABNORMAL LOW (ref 22–32)
Calcium: 7.6 mg/dL — ABNORMAL LOW (ref 8.9–10.3)
Chloride: 107 mmol/L (ref 101–111)
Creatinine, Ser: 0.69 mg/dL (ref 0.61–1.24)
GFR calc Af Amer: >60 mL/min (ref >60)
GFR calc non Af Amer: >60 mL/min (ref >60)
Glucose, Bld: 93 mg/dL (ref 65–99)
Potassium: 4 mmol/L (ref 3.5–5.1)
Sodium: 138 mmol/L (ref 135–145)
Total Bilirubin: 1.8 mg/dL — ABNORMAL HIGH (ref 0.3–1.2)
Total Protein: 4.7 g/dL — ABNORMAL LOW (ref 6.5–8.1)

## 2015-11-15 LAB — CBC
HCT: 29.3 % — ABNORMAL LOW (ref 39.0–52.0)
Hemoglobin: 9.5 g/dL — ABNORMAL LOW (ref 13.0–17.0)
MCH: 28.6 pg (ref 26.0–34.0)
MCHC: 32.4 g/dL (ref 30.0–36.0)
MCV: 88.3 fL (ref 78.0–100.0)
Platelets: 485 10*3/uL — ABNORMAL HIGH (ref 150–400)
RBC: 3.32 MIL/uL — ABNORMAL LOW (ref 4.22–5.81)
RDW: 17 % — ABNORMAL HIGH (ref 11.5–15.5)
WBC: 13.5 10*3/uL — ABNORMAL HIGH (ref 4.0–10.5)

## 2015-11-15 LAB — GLUCOSE, CAPILLARY: Glucose-Capillary: 70 mg/dL (ref 65–99)

## 2015-11-15 LAB — HEPARIN LEVEL (UNFRACTIONATED): Heparin Unfractionated: 0.37 IU/mL (ref 0.30–0.70)

## 2015-11-15 LAB — PHOSPHORUS: Phosphorus: 3.3 mg/dL (ref 2.5–4.6)

## 2015-11-15 LAB — MAGNESIUM: Magnesium: 1.7 mg/dL (ref 1.7–2.4)

## 2015-11-15 MED ORDER — MAGNESIUM SULFATE 2 GM/50ML IV SOLN
2.0000 g | Freq: Once | INTRAVENOUS | Status: AC
  Administered 2015-11-15: 2 g via INTRAVENOUS
  Filled 2015-11-15: qty 50

## 2015-11-15 MED ORDER — TRACE MINERALS CR-CU-MN-SE-ZN 10-1000-500-60 MCG/ML IV SOLN
INTRAVENOUS | Status: AC
  Administered 2015-11-15: 18:00:00 via INTRAVENOUS
  Filled 2015-11-15: qty 960

## 2015-11-15 MED ORDER — FAT EMULSION 20 % IV EMUL
240.0000 mL | INTRAVENOUS | Status: AC
  Administered 2015-11-15: 240 mL via INTRAVENOUS
  Filled 2015-11-15: qty 250

## 2015-11-15 MED ORDER — SODIUM CHLORIDE 0.9% FLUSH
10.0000 mL | INTRAVENOUS | Status: DC | PRN
  Administered 2015-11-16: 10 mL
  Filled 2015-11-15: qty 40

## 2015-11-15 MED ORDER — SODIUM CHLORIDE 0.9% FLUSH: 10.0000 mL | Freq: Two times a day (BID) | INTRAVENOUS | Status: DC

## 2015-11-15 MED ORDER — GLUCOSE 40 % PO GEL
ORAL | Status: AC
Start: 2015-11-15 — End: 2015-11-15
  Administered 2015-11-15: 37.5 g
  Filled 2015-11-15: qty 1

## 2015-11-15 NOTE — Progress Notes (Signed)
Peripherally Inserted Central Catheter/Midline Placement  The IV Nurse has discussed with the patient and/or persons authorized to consent for the patient, the purpose of this procedure and the potential benefits and risks involved with this procedure.  The benefits include less needle sticks, lab draws from the catheter and patient may be discharged home with the catheter.  Risks include, but not limited to, infection, bleeding, blood clot (thrombus formation), and puncture of an artery; nerve damage and irregular heat beat.  Alternatives to this procedure were also discussed.  PICC/Midline Placement Documentation  PICC Double Lumen 11/15/15 PICC Left Brachial 43 cm 0 cm (Active)  Indication for Insertion or Continuance of Line Administration of hyperosmolar/irritating solutions (i.e. TPN, Vancomycin, etc.) 11/15/2015  8:59 AM  Exposed Catheter (cm) 0 cm 11/15/2015  8:59 AM  Site Assessment Clean;Dry;Intact 11/15/2015  8:59 AM  Lumen #1 Status Flushed;Saline locked;Blood return noted 11/15/2015  8:59 AM  Lumen #2 Status Flushed;Saline locked;Blood return noted 11/15/2015  8:59 AM  Dressing Type Transparent 11/15/2015  8:59 AM  Dressing Change Due 11/22/15 11/15/2015  8:59 AM       Gordan Payment 11/15/2015, 9:00 AM

## 2015-11-15 NOTE — Care Management Important Message (Signed)
Important Message  Patient Details  Name: Steven Frey MRN: [id number] Date of Birth: September 14, 1956   Medicare Important Message Given:  Yes    Apolonio Schneiders, RN 11/15/2015, 10:07 AM

## 2015-11-15 NOTE — Progress Notes (Signed)
[place] for heparin Indication: splenic infarct, left common iliac artery thrombus  Allergies  Allergen Reactions  . Azithromycin Anaphylaxis and Swelling    Throat swelling, body swelling  . Lisinopril Anaphylaxis, Hives and Swelling    REACTION: facial/neck edema    Patient Measurements: Height: 5\' 8"  (172.7 cm) Weight: 128 lb 6.4 oz (58.242 kg) IBW/kg (Calculated) : 68.4 Heparin Dosing Weight: 53.8 kg  Vital Signs: Temp: 97.6 F (36.4 C) (02/05 0515) Temp Source: Oral (02/05 0515) BP: 117/68 mmHg (02/05 0515) Pulse Rate: 92 (02/05 0515)  Labs:  Recent Labs  11/13/15 0706 11/13/15 1033  11/14/15 0217 11/14/15 1607 11/15/15 0656  HGB 7.6* 7.3*  < > 6.8* 10.1* 9.5*  HCT 24.1* 23.6*  < > 22.0* 31.6* 29.3*  PLT 543* 593*  < > 554* 505* 485*  LABPROT  --  16.8*  --   --   --   --   INR  --  1.35  --   --   --   --   HEPARINUNFRC 0.33  --   < > <0.10* 0.15* 0.37  CREATININE 0.97  --   --   --  0.65 0.69  < > = values in this interval not displayed.  Estimated Creatinine Clearance: 82.9 mL/min (by C-G formula based on Cr of 0.69).  Assessment: 60 yo male with left common iliac artery thrombus for heparin.    HL therapeutic at 0.37, Hgb 6.8>>10>>9.5 after 2 units, Plts 485, no overt bleeding noted  Goal of Therapy:  Heparin level 0.3-0.7 units/ml Monitor platelets by anticoagulation protocol: Yes   Plan:  Continue Heparin 1300 units/hr Daily HL/CBC Monitor for s/sx bleeding  Bennye Alm, PharmD Pharmacy Resident [phone]  11/15/2015 8:12 AM

## 2015-11-15 NOTE — Consult Note (Signed)
.   [facility]  Subjective  - feels a little better   Objective 117/68 92 97.6 F (36.4 C) (Oral) 16 96%  Intake/Output Summary (Last 24 hours) at 11/15/15 1128 Last data filed at 11/15/15 0500  Gross per 24 hour  Intake      0 ml  Output    700 ml  Net   -700 ml   Left leg unchanged pulse exam from yesterday  Assessment/Planning: Iliac thrombus, tolerating heparin currently Hemoglobin appropriate rise post transfusion  Ruta Hinds 11/15/2015 11:28 AM --  Laboratory Lab Results:  Recent Labs  11/14/15 1607 11/15/15 0656  WBC 12.9* 13.5*  HGB 10.1* 9.5*  HCT 31.6* 29.3*  PLT 505* 485*   BMET  Recent Labs  11/14/15 1607 11/15/15 0656  NA 141 138  K 3.9 4.0  CL 107 107  CO2 22 19*  GLUCOSE 86 93  BUN 7 7  CREATININE 0.65 0.69  CALCIUM 8.0* 7.6*    COAG Lab Results  Component Value Date   INR 1.35 11/13/2015   INR 1.23 11/02/2015   INR 1.07 12/26/2014   No results found for: PTT

## 2015-11-15 NOTE — Progress Notes (Signed)
PARENTERAL NUTRITION CONSULT NOTE - INITIAL  Pharmacy Consult for TPN Indication: Staff or IR could not get tube past GE junction  Allergies  Allergen Reactions  . Azithromycin Anaphylaxis and Swelling    Throat swelling, body swelling  . Lisinopril Anaphylaxis, Hives and Swelling    REACTION: facial/neck edema   Patient Measurements: Height: 5\' 8"  (172.7 cm) Weight: 128 lb 6.4 oz (58.242 kg) IBW/kg (Calculated) : 68.4  Vital Signs: Temp: 97.6 F (36.4 C) (02/05 0515) Temp Source: Oral (02/05 0515) BP: 117/68 mmHg (02/05 0515) Pulse Rate: 92 (02/05 0515) Intake/Output from previous day: 02/04 0701 - 02/05 0700 In: 380 [Blood:380] Out: 700 [Urine:700] Intake/Output from this shift:  Labs:  Recent Labs  11/13/15 1033  11/14/15 0217 11/14/15 1607 11/15/15 0656  WBC 14.9*  < > 13.0* 12.9* 13.5*  HGB 7.3*  < > 6.8* 10.1* 9.5*  HCT 23.6*  < > 22.0* 31.6* 29.3*  PLT 593*  < > 554* 505* 485*  INR 1.35  --   --   --   --   < > = values in this interval not displayed.  Recent Labs  11/12/15 1702 11/13/15 0706 11/14/15 1607 11/15/15 0656  NA 140 139 141 138  K 4.8 4.0 3.9 4.0  CL 102 102 107 107  CO2 22 24 22  19*  GLUCOSE 124* 91 86 93  BUN 15 13 7 7   CREATININE 1.06 0.97 0.65 0.69  CALCIUM 8.8* 8.4* 8.0* 7.6*  MG  --   --  1.5* 1.7  PHOS  --   --  3.0 3.3  PROT 6.3* 5.7*  --  4.7*  ALBUMIN 1.9* 2.1*  --  1.7*  AST 21 17  --  32  ALT 10* 9*  --  13*  ALKPHOS 74 58  --  50  BILITOT 0.6 0.7  --  1.8*   Estimated Creatinine Clearance: 82.9 mL/min (by C-G formula based on Cr of 0.69).   Recent Labs  11/13/15 0555 11/14/15 0609 11/15/15 0556  GLUCAP 80 90 70   Medical History: Past Medical History  Diagnosis Date  . Angioedema     rx requiring intubation/vent support suspected secondary to ACE1 (but also on zithromax) 5/09 hospitalization   . Smoker   . ETOH abuse   . Chronic pancreatitis ([place])     (10/08 [place]). admx 02/2010 pseudocyst aspirated  during admxn. pancreatic dual stent placement at [facility] 11/11  . Cirrhosis ([employer]) 2011    due to ETOH with recent hepatic abcess and possible HCC.   . Gastritis     alcohol induced  . Depression with anxiety     no hx of meds  . VF (ventricular fibrillation) ([place]) 2011    arrest 6/11. successfully rescucitated w/prolinged hospitalization due to respitatory failure. QT prolongation during cooling, now resolved  . History of stroke 2011    reports it was caused by pain from pancreatitis- caused heart to stop and stroke   . 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 Edema   . 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 Anoxic brain damage ([facility])   . Anemia   . Hypertension 2011  . Arthritis   . Chronic pain 2011  . Hyperlipidemia 2011  . Inguinal hernia 2011  . COPD (chronic obstructive pulmonary disease) ([place])     DOE  . GERD (gastroesophageal reflux disease)   . Pneumonia     hx of  . Stroke [facility])  right sided weakness; affected long term memory  . Diabetes mellitus without complication ([place])     Type 2, pt reports no longer takes meds & A1C was normal.  . Myocardial infarction ([place])   . Seizures ([place]) 2011    had seizure 11-24-14/went to ED per pt.    Insulin Requirements in the past 24 hours:  None  Current Nutrition:  NPO - Protein-calorie malnutrition with weight loss  Assessment: 60 yo male admitted with c/o abdominal pain.  He has chronic pancreatitis, poor nutrition habits, hx. of alcohol use, liver cirrhosis as well as other chronic co-morbidities.  A feeding tube was attempted by the staff and interventional radiology and could not be passed by the GE junction.  A PICC was placed and we have been asked to initiate parenteral nutrition for him. LABS: - His electrolytes are all within normal range including his Magnesium after a 2gm bolus yesterday but still on the lower end. ENDO: - He has a history of diabetes but is on no medications and his last  HgbA1c was normal (5.2 in 2014).  Currently CBG's are all < 150. GI: - Hx. of GERD, chronic pancreatitis - plan is ice chips only for now with TPN Renal: - Creat. 0.69, est crcl ~ 34ml/min.  Electrolytes are WNL FLUIDS: - Goal 1.8-2.0 L/24hr (0.9%NACL at 36ml/hr)   Nutritional Goals:  1800-2000 kCal, 100-115 grams of protein per day per RD  Plan:  - Begin Clinimix 5/15 E at 72ml/hr (1160kcal with lipids) - Begin Lipids 10% at 38ml/hr -Titrate Clinimix to goal rate of 55ml/hr to provide 1448 kcal and 102 gm of protein.  Total kcal with 10% Lipids will be 1928kcal once he is at goal - Monitor CBG and start SSI if needed - Review labs and supplement as required - Monitor for s/s of re-feeding syndrome  - Will repeat 2gm Magnesium bolus today - Will KVO fluids tomorrow  Rober Minion, PharmD., MS Clinical Pharmacist Pager:  [phone] Thank you for allowing pharmacy to be part of this patients care team. 11/15/2015,8:17 AM

## 2015-11-15 NOTE — Progress Notes (Signed)
Family Medicine Teaching Service Daily Progress Note Intern Pager: [phone]  Patient name: Steven Frey Medical record number: [id number] Date of birth: 1956/06/29 Age: 60 y.o. Gender: male  Primary Care Provider: Chrisandra Netters, MD Consultants: GI and Surgery  Code Status: Full   Pt Overview and Major Events to Date:  2/2: admitted for abdominal pain, thrombosis of left common iliac 2/3: thoracentesis performed   ABX/Culture Body culture 2/3  Assessment and Plan: Errol Kresge. is a 60 y.o. male presenting with acute on chronic pancreatitis. PMH is significant for chronic pancreatitis, alcohol use d/o, cirrhosis, hypertension, CVA, CAD, seizure d/o, anemia and depression.  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   #Pleural Effusion: thoracentesis yielded 60 mL of fluid. Pleural Amylase 6544, WBC's are elevated and left sided which are c/w pancreatitis.  - labs pending  #Protein-Calorie malnutrition, Severe: About 40 lbs weight loss in one year. Likely from pancreatitis, chronic alcohol abuse, poor appetite.  - Nutrition consult   - magnesium and phosphorus    #Cirrhosis/Ascites: secondary to chronic alcohol abuse.Child-Pugh Classification Grade A, MELD Score 9  - holding spironolactone and lasix - if develops fever then broad coverage for SBP  #Alcohol abuse:  Mental status is clear today. There  has been concern for Wernicke's encephalopathy.   - CIWA for EtOH use   - Thiamine 100 mg daily   #H/o CVA, MI in 2011:  Last stress test in 2014 and last Echo in 2016 within normal. His exam with contractures in his right arm and left sided weakness. Has tried botox in the past but has trouble with transportation    - Holding his statin, consider re-starting  Hypertension: stable.  Holding amlodipine 5 mg anc coreg 3.125 mg twice a day  Depression: stable. holding home zoloft  Seizure disorder: stable. Keppra 1000 mg twice a day starting now  FEN/GI: NS @ 50 cc/hr, NPO Prophylaxis: Heparin gtt  Disposition: pending improvement    Subjective:  Reports his pain is controlled but always present.  Having no complaints otherwise.    Objective: Temp:  [97.3 F (36.3 C)-98.8 F (37.1 C)] 97.6 F (36.4 C) (02/05 0515) Pulse Rate:  [92-100] 92 (02/05 0515) Resp:  [16-20] 16 (02/05 0515) BP: (101-120)/(62-76) 117/68 mmHg (02/05 0515) SpO2:  [96 %-98 %] 96 % (02/05 0515) Weight:  [128 lb 6.4 oz (58.242 kg)] 128 lb 6.4 oz (58.242 kg) (02/05 0515) Physical Exam: General: NAD, alert and orientated Cardiovascular: RRR, no murmurs, rubs or gallops  Respiratory: CTAB, no wheezes noted  Abdomen: hyperactive BS, guarding, epigastric tenderness as well as tenderness throughout abdomen, negative peritoneal however increased pain with intraabdominal pressure  Extremities: 2+ TP bilateral, both legs warm, and no swelling noted.   Laboratory:  Recent Labs Lab 11/13/15 2150 11/14/15 0217 11/14/15 1607  WBC 16.5* 13.0* 12.9*  HGB  7.8* 6.8* 10.1*  HCT 25.2* 22.0* 31.6*  PLT PLATELET CLUMPS NOTED ON SMEAR, COUNT APPEARS INCREASED 554* 505*    Recent Labs Lab 11/12/15 1702 11/13/15 0706 11/14/15 1607  NA 140 139 141  K 4.8 4.0 3.9  CL 102 102 107  CO2 22 24 22   BUN 15 13 7   CREATININE 1.06 0.97 0.65  CALCIUM 8.8* 8.4* 8.0*  PROT 6.3* 5.7*  --   BILITOT 0.6 0.7  --   ALKPHOS 74 58   --   ALT 10* 9*  --   AST 21 17  --   GLUCOSE 124* 91 86    Imaging/Diagnostic Tests: Dg Chest 1 View  11/13/2015  CLINICAL DATA:  Status post left thoracentesis EXAM: CHEST 1 VIEW COMPARISON:  11/02/2015 FINDINGS: Cardiac shadow is stable. Small left-sided pleural effusion remains. No pneumothorax is noted following thoracentesis. Mild left basilar atelectasis is noted. IMPRESSION: No evidence of pneumothorax following thoracentesis. Small left pleural effusion with basilar atelectasis. Electronically Signed   By: Inez Catalina M.D.   On: 11/13/2015 15:03   Dg Addison Bailey G Tube Plc W/fl-no Rad  11/13/2015  CLINICAL DATA:  NASO G TUBE PLACEMENT WITH FLUORO Fluoroscopy was utilized by the requesting physician.  No radiographic interpretation.   US Thoracentesis Asp Pleural Space W/img Guide  11/13/2015  INDICATION: Pancreatitis, cirrhosis. Small left pleural effusion noted on recent CT imaging of the abdomen. Request diagnostic left thoracentesis. EXAM: ULTRASOUND GUIDED LEFT THORACENTESIS MEDICATIONS: None. COMPLICATIONS: None immediate. PROCEDURE: An ultrasound guided thoracentesis was thoroughly discussed with the patient and questions answered. The benefits, risks, alternatives and complications were also discussed. The patient understands and wishes to proceed with the procedure. Written consent was obtained. Ultrasound was performed and finds small, complex, densely loculated pleural effusion. An adequate pocket of fluid was identified in the left chest. The area was then prepped and draped in the normal sterile fashion. 1% Lidocaine was used for local anesthesia. Under ultrasound guidance a 19 gauge, 7 cm, Yueh catheter was introduced. Thoracentesis was performed. The catheter was removed and a dressing applied. FINDINGS: A total of approximately 60 mL of cloudy, bloody fluid was removed. Samples were sent to the laboratory as requested by the clinical team. IMPRESSION: Successful ultrasound guided left  thoracentesis yielding 60 mL of pleural fluid. Read by: Ascencion Dike PA-C Electronically Signed   By: Jacqulynn Cadet M.D.   On: 11/13/2015 14:55    Rosemarie Ax, MD 11/15/2015, 6:39 AM PGY-3, [place] Intern pager: [phone], text pages welcome

## 2015-11-15 NOTE — Progress Notes (Signed)
UNASSIGNED PATIENT CROSS COVER [facility] Subjective: Since I last evaluated the patient, there has not been much change in his overall condition except that he's had a PICC line placed and this scheduled to start TPN this evening. He has not had a bowel movement in over a week. No overt sign of GI bleeding in the form of melena hematochezia or coffee-ground emesis or hematemesis.   Objective: Vital signs in last 24 hours: Temp:  [97.3 F (36.3 C)-98.8 F (37.1 C)] 97.6 F (36.4 C) (02/05 0515) Pulse Rate:  [92-100] 92 (02/05 0515) Resp:  [16-20] 16 (02/05 0515) BP: (103-120)/(62-76) 117/68 mmHg (02/05 0515) SpO2:  [96 %-98 %] 96 % (02/05 0515) Weight:  [58.242 kg (128 lb 6.4 oz)] 58.242 kg (128 lb 6.4 oz) (02/05 0515) Last BM Date: 11/14/15  Intake/Output from previous day: 02/04 0701 - 02/05 0700 In: 380 [Blood:380] Out: 700 [Urine:700] Intake/Output this shift:   General appearance: appears older than stated age, cachectic, flushed, mild distress, pale and slowed mentation Resp: clear to auscultation bilaterally Cardio: regular rate and rhythm, S1, S2 normal, no murmur, click, rub or gallop GI: soft, diffusely tender on palpation with hypoactive bowel sounds; no masses,  no organomegaly  Lab Results:  Recent Labs  11/14/15 0217 11/14/15 1607 11/15/15 0656  WBC 13.0* 12.9* 13.5*  HGB 6.8* 10.1* 9.5*  HCT 22.0* 31.6* 29.3*  PLT 554* 505* 485*   BMET  Recent Labs  11/13/15 0706 11/14/15 1607 11/15/15 0656  NA 139 141 138  K 4.0 3.9 4.0  CL 102 107 107  CO2 24 22 19*  GLUCOSE 91 86 93  BUN 13 7 7   CREATININE 0.97 0.65 0.69  CALCIUM 8.4* 8.0* 7.6*   LFT  Recent Labs  11/15/15 0656  PROT 4.7*  ALBUMIN 1.7*  AST 32  ALT 13*  ALKPHOS 50  BILITOT 1.8*   PT/INR  Recent Labs  11/13/15 1033  LABPROT 16.8*  INR 1.35   Studies/Results: Dg Chest 1 View  11/13/2015  CLINICAL DATA:  Status post left thoracentesis EXAM: CHEST 1 VIEW COMPARISON:  11/02/2015  FINDINGS: Cardiac shadow is stable. Small left-sided pleural effusion remains. No pneumothorax is noted following thoracentesis. Mild left basilar atelectasis is noted. IMPRESSION: No evidence of pneumothorax following thoracentesis. Small left pleural effusion with basilar atelectasis. Electronically Signed   By: Inez Catalina M.D.   On: 11/13/2015 15:03   Dg Addison Bailey G Tube Plc W/fl-no Rad  11/13/2015  CLINICAL DATA:  NASO G TUBE PLACEMENT WITH FLUORO Fluoroscopy was utilized by the requesting physician.  No radiographic interpretation.   US Thoracentesis Asp Pleural Space W/img Guide  11/13/2015  INDICATION: Pancreatitis, cirrhosis. Small left pleural effusion noted on recent CT imaging of the abdomen. Request diagnostic left thoracentesis. EXAM: ULTRASOUND GUIDED LEFT THORACENTESIS MEDICATIONS: None. COMPLICATIONS: None immediate. PROCEDURE: An ultrasound guided thoracentesis was thoroughly discussed with the patient and questions answered. The benefits, risks, alternatives and complications were also discussed. The patient understands and wishes to proceed with the procedure. Written consent was obtained. Ultrasound was performed and finds small, complex, densely loculated pleural effusion. An adequate pocket of fluid was identified in the left chest. The area was then prepped and draped in the normal sterile fashion. 1% Lidocaine was used for local anesthesia. Under ultrasound guidance a 19 gauge, 7 cm, Yueh catheter was introduced. Thoracentesis was performed. The catheter was removed and a dressing applied. FINDINGS: A total of approximately 60 mL of cloudy, bloody fluid was removed. Samples were  sent to the laboratory as requested by the clinical team. IMPRESSION: Successful ultrasound guided left thoracentesis yielding 60 mL of pleural fluid. Read by: Ascencion Dike PA-C Electronically Signed   By: Jacqulynn Cadet M.D.   On: 11/13/2015 14:55   Medications: I have reviewed the patient's current  medications.  Assessment/Plan: 1) Severe acute on chronic alcoholic pancreatitis with ascites and questionable pancreatic duct leak TPN to be started today bowel rest emphasized on Fentanyl round-the-clock for pain control. 2) Status post drainage of pleural effusion or thoracentesis. 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  4) Severe malnutrition from chronic alcohol abuse.  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  6) Hypertension/Hhyperlipidemia.  7) Narcotic-induced chronic constipation.  8) severe anemia-received 2 units of blood.  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  LOS: 3 days   Zyanne Schumm 11/15/2015, 8:46 AM

## 2015-11-16 ENCOUNTER — Other Ambulatory Visit: Payer: Self-pay

## 2015-11-16 ENCOUNTER — Other Ambulatory Visit: Payer: Self-pay | Admitting: *Deleted

## 2015-11-16 DIAGNOSIS — I69351 Hemiplegia and hemiparesis following cerebral infarction affecting right dominant side: Secondary | ICD-10-CM | POA: Diagnosis not present

## 2015-11-16 DIAGNOSIS — Q453 Other congenital malformations of pancreas and pancreatic duct: Secondary | ICD-10-CM | POA: Diagnosis not present

## 2015-11-16 DIAGNOSIS — G40909 Epilepsy, unspecified, not intractable, without status epilepticus: Secondary | ICD-10-CM | POA: Diagnosis not present

## 2015-11-16 DIAGNOSIS — J189 Pneumonia, unspecified organism: Secondary | ICD-10-CM | POA: Diagnosis not present

## 2015-11-16 DIAGNOSIS — R1013 Epigastric pain: Secondary | ICD-10-CM | POA: Diagnosis not present

## 2015-11-16 DIAGNOSIS — J9601 Acute respiratory failure with hypoxia: Secondary | ICD-10-CM | POA: Diagnosis not present

## 2015-11-16 DIAGNOSIS — K920 Hematemesis: Secondary | ICD-10-CM | POA: Diagnosis not present

## 2015-11-16 DIAGNOSIS — E871 Hypo-osmolality and hyponatremia: Secondary | ICD-10-CM | POA: Diagnosis not present

## 2015-11-16 DIAGNOSIS — K7460 Unspecified cirrhosis of liver: Secondary | ICD-10-CM | POA: Insufficient documentation

## 2015-11-16 DIAGNOSIS — J440 Chronic obstructive pulmonary disease with acute lower respiratory infection: Secondary | ICD-10-CM | POA: Diagnosis present

## 2015-11-16 DIAGNOSIS — Z452 Encounter for adjustment and management of vascular access device: Secondary | ICD-10-CM | POA: Diagnosis not present

## 2015-11-16 DIAGNOSIS — R262 Difficulty in walking, not elsewhere classified: Secondary | ICD-10-CM | POA: Diagnosis not present

## 2015-11-16 DIAGNOSIS — R0602 Shortness of breath: Secondary | ICD-10-CM | POA: Diagnosis not present

## 2015-11-16 DIAGNOSIS — R1112 Projectile vomiting: Secondary | ICD-10-CM | POA: Diagnosis not present

## 2015-11-16 DIAGNOSIS — Z4682 Encounter for fitting and adjustment of non-vascular catheter: Secondary | ICD-10-CM | POA: Diagnosis not present

## 2015-11-16 DIAGNOSIS — R1084 Generalized abdominal pain: Secondary | ICD-10-CM | POA: Diagnosis not present

## 2015-11-16 DIAGNOSIS — D649 Anemia, unspecified: Secondary | ICD-10-CM | POA: Diagnosis not present

## 2015-11-16 DIAGNOSIS — A0811 Acute gastroenteropathy due to Norwalk agent: Secondary | ICD-10-CM | POA: Diagnosis not present

## 2015-11-16 DIAGNOSIS — R6520 Severe sepsis without septic shock: Secondary | ICD-10-CM | POA: Diagnosis not present

## 2015-11-16 DIAGNOSIS — R4182 Altered mental status, unspecified: Secondary | ICD-10-CM | POA: Diagnosis not present

## 2015-11-16 DIAGNOSIS — E874 Mixed disorder of acid-base balance: Secondary | ICD-10-CM | POA: Diagnosis not present

## 2015-11-16 DIAGNOSIS — K921 Melena: Secondary | ICD-10-CM | POA: Diagnosis not present

## 2015-11-16 DIAGNOSIS — E873 Alkalosis: Secondary | ICD-10-CM | POA: Diagnosis not present

## 2015-11-16 DIAGNOSIS — I493 Ventricular premature depolarization: Secondary | ICD-10-CM | POA: Diagnosis not present

## 2015-11-16 DIAGNOSIS — J151 Pneumonia due to Pseudomonas: Secondary | ICD-10-CM | POA: Diagnosis not present

## 2015-11-16 DIAGNOSIS — Z515 Encounter for palliative care: Secondary | ICD-10-CM | POA: Diagnosis not present

## 2015-11-16 DIAGNOSIS — E43 Unspecified severe protein-calorie malnutrition: Secondary | ICD-10-CM | POA: Diagnosis not present

## 2015-11-16 DIAGNOSIS — R197 Diarrhea, unspecified: Secondary | ICD-10-CM | POA: Diagnosis not present

## 2015-11-16 DIAGNOSIS — K861 Other chronic pancreatitis: Secondary | ICD-10-CM | POA: Diagnosis not present

## 2015-11-16 DIAGNOSIS — I728 Aneurysm of other specified arteries: Secondary | ICD-10-CM | POA: Diagnosis not present

## 2015-11-16 DIAGNOSIS — R0600 Dyspnea, unspecified: Secondary | ICD-10-CM | POA: Diagnosis not present

## 2015-11-16 DIAGNOSIS — Z7409 Other reduced mobility: Secondary | ICD-10-CM | POA: Diagnosis not present

## 2015-11-16 DIAGNOSIS — J441 Chronic obstructive pulmonary disease with (acute) exacerbation: Secondary | ICD-10-CM | POA: Diagnosis not present

## 2015-11-16 DIAGNOSIS — F329 Major depressive disorder, single episode, unspecified: Secondary | ICD-10-CM | POA: Diagnosis not present

## 2015-11-16 DIAGNOSIS — J90 Pleural effusion, not elsewhere classified: Secondary | ICD-10-CM | POA: Diagnosis not present

## 2015-11-16 DIAGNOSIS — Z938 Other artificial opening status: Secondary | ICD-10-CM | POA: Diagnosis not present

## 2015-11-16 DIAGNOSIS — I471 Supraventricular tachycardia: Secondary | ICD-10-CM | POA: Diagnosis present

## 2015-11-16 DIAGNOSIS — J159 Unspecified bacterial pneumonia: Secondary | ICD-10-CM | POA: Diagnosis not present

## 2015-11-16 DIAGNOSIS — M6281 Muscle weakness (generalized): Secondary | ICD-10-CM | POA: Diagnosis not present

## 2015-11-16 DIAGNOSIS — G9341 Metabolic encephalopathy: Secondary | ICD-10-CM | POA: Diagnosis not present

## 2015-11-16 DIAGNOSIS — A4159 Other Gram-negative sepsis: Secondary | ICD-10-CM | POA: Diagnosis not present

## 2015-11-16 DIAGNOSIS — I2510 Atherosclerotic heart disease of native coronary artery without angina pectoris: Secondary | ICD-10-CM | POA: Diagnosis not present

## 2015-11-16 DIAGNOSIS — K852 Alcohol induced acute pancreatitis without necrosis or infection: Secondary | ICD-10-CM | POA: Diagnosis present

## 2015-11-16 DIAGNOSIS — K859 Acute pancreatitis without necrosis or infection, unspecified: Secondary | ICD-10-CM | POA: Diagnosis not present

## 2015-11-16 DIAGNOSIS — A419 Sepsis, unspecified organism: Secondary | ICD-10-CM | POA: Diagnosis not present

## 2015-11-16 DIAGNOSIS — J158 Pneumonia due to other specified bacteria: Secondary | ICD-10-CM | POA: Diagnosis not present

## 2015-11-16 DIAGNOSIS — R000 Tachycardia, unspecified: Secondary | ICD-10-CM | POA: Diagnosis not present

## 2015-11-16 DIAGNOSIS — I745 Embolism and thrombosis of iliac artery: Secondary | ICD-10-CM

## 2015-11-16 DIAGNOSIS — K7030 Alcoholic cirrhosis of liver without ascites: Secondary | ICD-10-CM | POA: Diagnosis not present

## 2015-11-16 DIAGNOSIS — K8591 Acute pancreatitis with uninfected necrosis, unspecified: Secondary | ICD-10-CM | POA: Diagnosis not present

## 2015-11-16 DIAGNOSIS — K8521 Alcohol induced acute pancreatitis with uninfected necrosis: Secondary | ICD-10-CM | POA: Diagnosis not present

## 2015-11-16 DIAGNOSIS — I509 Heart failure, unspecified: Secondary | ICD-10-CM | POA: Diagnosis present

## 2015-11-16 DIAGNOSIS — J80 Acute respiratory distress syndrome: Secondary | ICD-10-CM | POA: Diagnosis not present

## 2015-11-16 DIAGNOSIS — R188 Other ascites: Secondary | ICD-10-CM | POA: Diagnosis not present

## 2015-11-16 DIAGNOSIS — I10 Essential (primary) hypertension: Secondary | ICD-10-CM | POA: Diagnosis not present

## 2015-11-16 DIAGNOSIS — J156 Pneumonia due to other aerobic Gram-negative bacteria: Secondary | ICD-10-CM | POA: Diagnosis not present

## 2015-11-16 DIAGNOSIS — R1319 Other dysphagia: Secondary | ICD-10-CM | POA: Diagnosis not present

## 2015-11-16 DIAGNOSIS — K922 Gastrointestinal hemorrhage, unspecified: Secondary | ICD-10-CM | POA: Diagnosis not present

## 2015-11-16 DIAGNOSIS — I472 Ventricular tachycardia: Secondary | ICD-10-CM | POA: Diagnosis not present

## 2015-11-16 DIAGNOSIS — J948 Other specified pleural conditions: Secondary | ICD-10-CM | POA: Diagnosis not present

## 2015-11-16 DIAGNOSIS — R1312 Dysphagia, oropharyngeal phase: Secondary | ICD-10-CM | POA: Diagnosis not present

## 2015-11-16 DIAGNOSIS — K860 Alcohol-induced chronic pancreatitis: Secondary | ICD-10-CM | POA: Diagnosis not present

## 2015-11-16 DIAGNOSIS — K259 Gastric ulcer, unspecified as acute or chronic, without hemorrhage or perforation: Secondary | ICD-10-CM | POA: Diagnosis not present

## 2015-11-16 DIAGNOSIS — K2210 Ulcer of esophagus without bleeding: Secondary | ICD-10-CM | POA: Diagnosis present

## 2015-11-16 DIAGNOSIS — F418 Other specified anxiety disorders: Secondary | ICD-10-CM | POA: Diagnosis not present

## 2015-11-16 DIAGNOSIS — Z681 Body mass index (BMI) 19 or less, adult: Secondary | ICD-10-CM | POA: Diagnosis not present

## 2015-11-16 DIAGNOSIS — J449 Chronic obstructive pulmonary disease, unspecified: Secondary | ICD-10-CM | POA: Diagnosis not present

## 2015-11-16 DIAGNOSIS — R41841 Cognitive communication deficit: Secondary | ICD-10-CM | POA: Diagnosis not present

## 2015-11-16 DIAGNOSIS — I639 Cerebral infarction, unspecified: Secondary | ICD-10-CM | POA: Diagnosis not present

## 2015-11-16 DIAGNOSIS — D72829 Elevated white blood cell count, unspecified: Secondary | ICD-10-CM | POA: Diagnosis not present

## 2015-11-16 LAB — COMPREHENSIVE METABOLIC PANEL WITH GFR
ALT: 12 U/L — ABNORMAL LOW (ref 17–63)
AST: 16 U/L (ref 15–41)
Albumin: 1.6 g/dL — ABNORMAL LOW (ref 3.5–5.0)
Alkaline Phosphatase: 48 U/L (ref 38–126)
Anion gap: 8 (ref 5–15)
BUN: <5 mg/dL — ABNORMAL LOW (ref 6–20)
CO2: 25 mmol/L (ref 22–32)
Calcium: 7.7 mg/dL — ABNORMAL LOW (ref 8.9–10.3)
Chloride: 103 mmol/L (ref 101–111)
Creatinine, Ser: 0.56 mg/dL — ABNORMAL LOW (ref 0.61–1.24)
GFR calc Af Amer: >60 mL/min
GFR calc non Af Amer: >60 mL/min
Glucose, Bld: 117 mg/dL — ABNORMAL HIGH (ref 65–99)
Potassium: 3.2 mmol/L — ABNORMAL LOW (ref 3.5–5.1)
Sodium: 136 mmol/L (ref 135–145)
Total Bilirubin: 0.9 mg/dL (ref 0.3–1.2)
Total Protein: 4.9 g/dL — ABNORMAL LOW (ref 6.5–8.1)

## 2015-11-16 LAB — DIFFERENTIAL
Basophils Absolute: 0 10*3/uL (ref 0.0–0.1)
Basophils Relative: 0 %
Eosinophils Absolute: 0.3 10*3/uL (ref 0.0–0.7)
Eosinophils Relative: 2 %
Lymphocytes Relative: 13 %
Lymphs Abs: 1.7 10*3/uL (ref 0.7–4.0)
Monocytes Absolute: 0.9 10*3/uL (ref 0.1–1.0)
Monocytes Relative: 6 %
Neutro Abs: 10.8 10*3/uL — ABNORMAL HIGH (ref 1.7–7.7)
Neutrophils Relative %: 79 %

## 2015-11-16 LAB — TRIGLYCERIDES: Triglycerides: 74 mg/dL (ref <150)

## 2015-11-16 LAB — CBC
HCT: 30 % — ABNORMAL LOW (ref 39.0–52.0)
Hemoglobin: 9.6 g/dL — ABNORMAL LOW (ref 13.0–17.0)
MCH: 28.2 pg (ref 26.0–34.0)
MCHC: 32 g/dL (ref 30.0–36.0)
MCV: 88 fL (ref 78.0–100.0)
Platelets: 497 10*3/uL — ABNORMAL HIGH (ref 150–400)
RBC: 3.41 MIL/uL — ABNORMAL LOW (ref 4.22–5.81)
RDW: 17.1 % — ABNORMAL HIGH (ref 11.5–15.5)
WBC: 13.7 10*3/uL — ABNORMAL HIGH (ref 4.0–10.5)

## 2015-11-16 LAB — GLUCOSE, CAPILLARY
Glucose-Capillary: 108 mg/dL — ABNORMAL HIGH (ref 65–99)
Glucose-Capillary: 130 mg/dL — ABNORMAL HIGH (ref 65–99)

## 2015-11-16 LAB — PH, BODY FLUID: pH, Body Fluid: 7.7

## 2015-11-16 LAB — PREALBUMIN: Prealbumin: 4.2 mg/dL — ABNORMAL LOW (ref 18–38)

## 2015-11-16 LAB — HEPARIN LEVEL (UNFRACTIONATED)
Heparin Unfractionated: 0.22 [IU]/mL — ABNORMAL LOW (ref 0.30–0.70)
Heparin Unfractionated: 0.33 IU/mL (ref 0.30–0.70)

## 2015-11-16 LAB — PATHOLOGIST SMEAR REVIEW

## 2015-11-16 LAB — MAGNESIUM: Magnesium: 1.6 mg/dL — ABNORMAL LOW (ref 1.7–2.4)

## 2015-11-16 LAB — PHOSPHORUS: Phosphorus: 3.5 mg/dL (ref 2.5–4.6)

## 2015-11-16 MED ORDER — DEXTROSE 5 % IV SOLN
1.0000 g | INTRAVENOUS | Status: DC
  Administered 2015-11-16: 1 g via INTRAVENOUS
  Filled 2015-11-16: qty 10

## 2015-11-16 MED ORDER — TRACE MINERALS CR-CU-MN-SE-ZN 10-1000-500-60 MCG/ML IV SOLN: INTRAVENOUS | Status: DC

## 2015-11-16 MED ORDER — FAT EMULSION 20 % IV EMUL
240.0000 mL | INTRAVENOUS | Status: DC
  Administered 2015-11-16: 240 mL via INTRAVENOUS
  Filled 2015-11-16: qty 250

## 2015-11-16 MED ORDER — CLINIMIX E/DEXTROSE (5/15) 5 % IV SOLN
INTRAVENOUS | Status: DC
  Administered 2015-11-16: 18:00:00 via INTRAVENOUS
  Filled 2015-11-16: qty 960

## 2015-11-16 MED ORDER — FAT EMULSION 20 % IV EMUL: 240.0000 mL | INTRAVENOUS | Status: DC

## 2015-11-16 MED ORDER — MAGNESIUM SULFATE 4 GM/100ML IV SOLN
4.0000 g | Freq: Once | INTRAVENOUS | Status: AC
  Administered 2015-11-16: 4 g via INTRAVENOUS
  Filled 2015-11-16 (×2): qty 100

## 2015-11-16 MED ORDER — POTASSIUM CHLORIDE 10 MEQ/50ML IV SOLN
10.0000 meq | INTRAVENOUS | Status: AC
  Administered 2015-11-16 (×5): 10 meq via INTRAVENOUS
  Filled 2015-11-16 (×4): qty 50

## 2015-11-16 NOTE — Progress Notes (Signed)
Utilization review completed.  

## 2015-11-16 NOTE — Discharge Summary (Signed)
[facility] Transfer Summary  Patient name: Steven Frey Medical record number: [id number] Date of birth: 10/08/1956 Age: 60 y.o. Gender: male Date of Admission: 11/12/2015  Date of Discharge: 11/16/2015  Admitting Physician: Lind Covert, MD  Primary Care Provider: Chrisandra Netters, MD Consultants: GI and Vascular  Indication for Hospitalization: Acute on Chronic Pancreatitis   Discharge Diagnoses/Problem List:  An anoxic brain injury Alcoholic cirrhosis  Cardiac arrest Chronic pancreatitis Tobacco use Hx of CVA Hypertension Hyperlipidemia Major depression  Disposition: Transfer to [facility]   Discharge Condition: Stable for transfer   Discharge Exam:  General: NAD, alert and orientated Cardiovascular: RRR, no murmurs, rubs or gallops  Respiratory: CTAB, no wheezes noted  Abdomen: hyperactive BS, guarding, epigastric tenderness as well as tenderness throughout abdomen, Extremities: 2+ TP bilateral, both legs warm, and no swelling noted.   Brief Hospital Course:  [facility] admitted to [facility] family medicine clinic with acute on chronic pancreatitis. At clinic Dr. Jennelle Human noted worsening abdominal pain and continued nausea and vomiting patient was tachycardic in the 110s and map of 66. Patient was recently hospitalized on 124- 1/27 for acute on chronic pancreatitis and was treated for culture negative SBP during that admission. Important to note patient had a pancreatic duct stent in 2011 at [facility] as well as a divisum which would increase his chance of pancreatitis. During this admission,  WBC 19.6 and Lipase was 201. CT scan was performed noting complicated pancreatitis with pseudocyst progression and stable omental edema fat necrosis. GI was consulted and indicated since the last paracentesis was concerning due to the presence of amylase, as it was possible that the pancreatic duct was leaking into surrounding body  cavity. Indicated that patient should be on bowel rest with pain control, and should be receiving parenteral nutrition. Unable to place a Dobbhoff tube after multiple attempts including with interventional radiology and therefore started TPN. Per discussion with GI, ERCP would be next step for patient and GI recommended transfer to [facility] for this procedure.  Upon admission patient was also found to have a left pleural effusion, which possibly was a hemothorax. Thoracentesis was performed and patient's was found to have PMN count of 5846 indicating possible SBP although unable to obtain albumin levels. Also concerning thoracentesis fluid contained elevated levels of amylase pointing towards pancreatic duct leakage. Pleural fluid culture negative for growth 3 days, gram stain negative. Patient was placed on ceftriaxone on 11/16/2015 per discussion with the GI.  During initial admission patient was found to have a rhombus of the common iliac artery, patient was started on heparin drip and vascular was consulted. Per discussion with vascular and GI patient was continued on heparin, with ongoing observation of left limb to ensure no ischemic changes.  As patient progressed during his stay his hemoglobin dropped from 9.5 to 6.8 patient was transfused 2 units of blood with appropriate response (hgb of 10.1).  Haptoglobin was elevated indicating that patient not likely with intravascular hemolysis, LDH was also elevated however this was likely due to organ damage rather than RBC hemolysis. On 11/14/2015 patient  Transfused, following patient's hemoglobin levels remained stable. Furthermore tachycardia seen during the initial days of admission resolved. Throughout patient never became hypotensive during stay.   Issues for Follow Up:  1. Please continue to trend BMET and CBC 2. Continue to follow vitals closely for any acute decompensation, as well as mental status changes. 3. Please continue heparin drip and  consult vascular to  determine next steps once patient is ready for discharge from hospital 4. Please continue to monitor left limb to ensure patient left leg does not become ischemic 5. Continue ceftriaxone to treat for possible SBP versus prophylaxis  Significant Procedures:  Thoracentesis   Pleural Fluid Culture- NGTD, Gram Stain negative   Results for Steven Frey, Steven Frey (MRN [id number]) as of 11/16/2015 14:28  Ref. Range 11/13/2015 15:04 11/13/2015 15:07  Monocyte-Macrophage-Serous Fluid Latest Ref Range: 50-90 % 37 (L)   Other Cells, Fluid Latest Units: % RARE   Glucose, Fluid Latest Units: mg/dL  79  Fluid Type-FGLU Unknown  FLUID  Total protein, fluid Latest Units: g/dL <3.0   Fluid Type-FCT Unknown FLUID   Fluid Type-FTP Unknown FLUID   Amylase, Pleural Fluid Latest Units: U/L  6544  Color, Fluid Latest Ref Range: YELLOW  RED (A)   WBC, Fluid Latest Ref Range: 0-1000 cu mm 9584 (H)   Lymphs, Fluid Latest Units: % 2   Eos, Fluid Latest Units: % 0   Appearance, Fluid Latest Ref Range: CLEAR  TURBID (A)   Neutrophil Count, Fluid Latest Ref Range: 0-25 % 61 (H)      Significant Labs and Imaging:   Recent Labs Lab 11/14/15 1607 11/15/15 0656 11/16/15 0515  WBC 12.9* 13.5* 13.7*  HGB 10.1* 9.5* 9.6*  HCT 31.6* 29.3* 30.0*  PLT 505* 485* 497*    Recent Labs Lab 11/12/15 1702 11/13/15 0706 11/14/15 1607 11/15/15 0656 11/16/15 0515  NA 140 139 141 138 136  K 4.8 4.0 3.9 4.0 3.2*  CL 102 102 107 107 103  CO2 22 24 22  19* 25  GLUCOSE 124* 91 86 93 117*  BUN 15 13 7 7  <5*  CREATININE 1.06 0.97 0.65 0.69 0.56*  CALCIUM 8.8* 8.4* 8.0* 7.6* 7.7*  MG  --   --  1.5* 1.7 1.6*  PHOS  --   --  3.0 3.3 3.5  ALKPHOS 74 58  --  50 48  AST 21 17  --  32 16  ALT 10* 9*  --  13* 12*  ALBUMIN 1.9* 2.1*  --  1.7* 1.6*     Results for Steven Frey, Steven Frey (MRN [id number]) as of 11/16/2015 14:28  Ref. Range 11/13/2015 18:29  LDH Latest Ref Range: 98-192 U/L 238 (H)   Haptoglobin  367    Results/Tests Pending at Time of Discharge: None Pending,   Discharge Medications:    Medication List    ASK your doctor about these medications        amLODipine 5 MG tablet  Commonly known as:  NORVASC  TAKE 1 TABLET (5 MG TOTAL) BY MOUTH DAILY.     amoxicillin-clavulanate 875-125 MG tablet  Commonly known as:  AUGMENTIN  Take 1 tablet by mouth 2 (two) times daily. Starting 11/07/2015     atorvastatin 40 MG tablet  Commonly known as:  LIPITOR  Take 40 mg by mouth daily.     carvedilol 3.125 MG tablet  Commonly known as:  COREG  TAKE 1 TABLET BY MOUTH 2 TIMES DAILY WITH A MEAL.     ciprofloxacin 500 MG tablet  Commonly known as:  CIPRO  Take 1 tablet (500 mg total) by mouth daily. Starting 11/10/2015     [employer] B-12 1000 MCG Tbcr  Generic drug:  Cyanocobalamin  TAKE 1 TABLET BY MOUTH DAILY     folic acid 1 MG tablet  Commonly known as:  FOLVITE  TAKE 1 TABLET BY MOUTH DAILY  furosemide 40 MG tablet  Commonly known as:  LASIX  Take 1 tablet (40 mg total) by mouth daily.     levETIRAcetam 1000 MG tablet  Commonly known as:  KEPPRA  TAKE 1 TABLET BY MOUTH 2 TIMES DAILY.     lipase/protease/amylase 12000 units Cpep capsule  Commonly known as:  CREON  Take 1 capsule (12,000 Units total) by mouth 3 (three) times daily before meals.     oxyCODONE 5 MG immediate release tablet  Commonly known as:  Oxy IR/ROXICODONE  Take 1 tablet (5 mg total) by mouth every 4 (four) hours as needed for moderate pain or severe pain.     polyethylene glycol powder powder  Commonly known as:  GLYCOLAX/MIRALAX  Take 17 g by mouth daily as needed.     promethazine 12.5 MG tablet  Commonly known as:  PHENERGAN  Take 1 tablet (12.5 mg total) by mouth every 6 (six) hours as needed for nausea or vomiting.     sertraline 100 MG tablet  Commonly known as:  ZOLOFT  TAKE 1 TABLET EVERY DAY     spironolactone 100 MG tablet  Commonly known as:  ALDACTONE  Take 1 tablet (100 mg  total) by mouth daily.     thiamine 100 MG tablet  Commonly known as:  VITAMIN B-1  Take 1 tablet (100 mg total) by mouth daily.        Discharge Instructions: Please refer to Patient Instructions section of EMR for full details.  Patient was counseled important signs and symptoms that should prompt return to medical care, changes in medications, dietary instructions, activity restrictions, and follow up appointments.   Follow-Up Appointments:   Steven Cletis Media, MD 11/16/2015, 3:10 PM PGY-1, [place]

## 2015-11-16 NOTE — Progress Notes (Addendum)
Family Medicine Teaching Service Daily Progress Note Intern Pager: [phone]  Patient name: Steven Frey Medical record number: [id number] Date of birth: September 13, 1956 Age: 60 y.o. Gender: male  Primary Care Provider: Chrisandra Netters, MD Consultants: GI and Surgery  Code Status: Full   Pt Overview and Major Events to Date:  2/2: admitted for abdominal pain, thrombosis of left common iliac 2/3: thoracentesis performed   ABX/Culture Body culture 2/3  Assessment and Plan: Banning Demille. is a 60 y.o. male presenting with acute on chronic pancreatitis. PMH is significant for chronic pancreatitis, alcohol use d/o, cirrhosis, hypertension, CVA, CAD, seizure d/o, anemia and depression.  #Acute on chronic Anemia: Chronically associated with his EtOH abuse. hgb 6.8> 2 units>>>Hgb 9.6. STABLE.  - monitor Hgb  #Acute on chronic pancreatitis: GI will determine if he needs transfer to [facility] on Monday for ERCP. WBC trending down.  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 Limbs warm, no swelling, pulses 2+ - heparin gtt - Vascular rec's: continue heparin, consider embolectomy if he develops ischemia of left foot.    #Pleural Effusion: thoracentesis yielded 60 mL of fluid. Pleural Amylase 6544, WBC's are elevated and left sided which are c/w pancreatitis.  - Will discuss with GI to determine if we need any antibiotics for possible SBP  #Protein-Calorie malnutrition, Severe: About 40 lbs weight loss in one year. Likely from pancreatitis, chronic alcohol abuse, poor appetite.  - Nutrition consult   - magnesium and phosphorus - TPN    #Cirrhosis/Ascites: secondary to chronic alcohol abuse.Child-Pugh Classification Grade A, MELD Score 9  - holding spironolactone and lasix - if develops fever then broad coverage for SBP  #Alcohol abuse:   Mental status is clear today. There has been concern for Wernicke's encephalopathy.   - CIWA for EtOH use   - Thiamine 100 mg daily   #H/o CVA, MI in 2011:  Last stress test in 2014 and last Echo in 2016 within normal. His exam with contractures in his right arm and left sided weakness. Has tried botox in the past but has trouble with transportation    - Holding his statin, consider re-starting  Hypertension: stable.  Holding amlodipine 5 mg anc coreg 3.125 mg twice a day  Depression: stable. holding home zoloft  Seizure disorder: stable. Keppra 1000 mg twice a day starting now  FEN/GI: TPN Prophylaxis: Heparin gtt  Disposition: pending improvement    Subjective:  Patient doing well this morning, pain 4/10. However states that he would like more pain control. No n/v, chest pain, SOB   Objective: Temp:  [97.6 F (36.4 C)-98.7 F (37.1 C)] 98.7 F (37.1 C) (02/06 0927) Pulse Rate:  [93-98] 97 (02/06 0927) Resp:  [17-18] 18 (02/06 0927) BP: (106-123)/(67-74) 106/71 mmHg (02/06 0927) SpO2:  [97 %-99 %] 97 % (02/06 0927) Weight:  [128 lb 6.4 oz (58.242 kg)] 128 lb 6.4 oz (58.242 kg) (02/05 1950) Physical Exam: General: NAD, alert and orientated Cardiovascular: RRR, no murmurs, rubs or gallops  Respiratory: CTAB, no wheezes noted  Abdomen: hyperactive BS, guarding, epigastric tenderness as well as tenderness throughout abdomen, negative peritoneal however increased pain with intraabdominal pressure  Extremities: 2+ TP bilateral, both legs warm, and no swelling noted.   Laboratory:  Recent Labs Lab 11/14/15 1607 11/15/15 0656 11/16/15 0515  WBC 12.9* 13.5* 13.7*  HGB 10.1* 9.5*  9.6*  HCT 31.6* 29.3* 30.0*  PLT 505* 485* 497*    Recent Labs Lab 11/13/15 0706 11/14/15 1607 11/15/15 0656 11/16/15 0515  NA 139 141 138 136  K 4.0 3.9 4.0 3.2*  CL 102 107 107 103  CO2 24 22 19* 25  BUN 13 7 7  <5*  CREATININE 0.97 0.65 0.69 0.56*  CALCIUM 8.4* 8.0* 7.6* 7.7*  PROT  5.7*  --  4.7* 4.9*  BILITOT 0.7  --  1.8* 0.9  ALKPHOS 58  --  50 48  ALT 9*  --  13* 12*  AST 17  --  32 16  GLUCOSE 91 86 93 117*    Imaging/Diagnostic Tests: No results found.  Hodaya Curto Cletis Media, MD 11/16/2015, 10:06 AM PGY-1, [place] Intern pager: [phone], text pages welcome

## 2015-11-16 NOTE — Progress Notes (Addendum)
[facility] Progress Note  Subjective:  Feels ok after he gets pain meds but otherwise still with a lot of abdominal pain.  Fluid from thoracentesis with very high white cell count.  No BM for several days, but patient does not feel that he needs enema or suppository at this time since he has no taken anything orally for several days.  Objective:  Vital signs in last 24 hours: Temp:  [97.6 F (36.4 C)-98.7 F (37.1 C)] 98.7 F (37.1 C) (02/06 0927) Pulse Rate:  [93-98] 97 (02/06 0927) Resp:  [17-18] 18 (02/06 0927) BP: (106-123)/(67-74) 106/71 mmHg (02/06 0927) SpO2:  [97 %-99 %] 97 % (02/06 0927) Weight:  [128 lb 6.4 oz (58.242 kg)] 128 lb 6.4 oz (58.242 kg) (02/05 1950) Last BM Date: 11/14/15 General:  Alert, thin, in NAD Heart:  Regular rate and rhythm; no murmurs Pulm:  CTAB.  No W/R/R. Abdomen:  Soft, non-distended.  BS present.  He does have some guarding and at least moderate TTP in upper abdomen.   Extremities:  Without edema. Neurologic:  Alert and oriented x 4; contractures of right hand.  Intake/Output from previous day: 02/05 0701 - 02/06 0700 In: 0  Out: 1100 [Urine:1100]  Lab Results:  Recent Labs  11/14/15 1607 11/15/15 0656 11/16/15 0515  WBC 12.9* 13.5* 13.7*  HGB 10.1* 9.5* 9.6*  HCT 31.6* 29.3* 30.0*  PLT 505* 485* 497*   BMET  Recent Labs  11/14/15 1607 11/15/15 0656 11/16/15 0515  NA 141 138 136  K 3.9 4.0 3.2*  CL 107 107 103  CO2 22 19* 25  GLUCOSE 86 93 117*  BUN 7 7 <5*  CREATININE 0.65 0.69 0.56*  CALCIUM 8.0* 7.6* 7.7*   LFT  Recent Labs  11/16/15 0515  PROT 4.9*  ALBUMIN 1.6*  AST 16  ALT 12*  ALKPHOS 48  BILITOT 0.9   Assessment / Plan: 1) Severe acute on chronic alcoholic pancreatitis/pseudocysts with ascites and questionable pancreatic duct leak (high amylase in fluid).  TPN started 2/5; bowel rest emphasized.  Pain control. 2) Status post drainage of pleural effusion via thoracentesis:  Appears  to have infection in fluid from cell count.  We have recommended broad-spectrum antibiotics. 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  4) Severe malnutrition from chronic alcohol abuse.  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  6) Hypertension/Hyperlipidemia.  7) Narcotic-induced chronic constipation:  No BM in several days, but patient has not put anything orally.  Patient does not feel that he needs an enema or suppository at this point.  8) Severe anemia-received 2 units of blood;  Hgb improved and stable.  No sign of GI bleeding.  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  *Discussed with primary team.  They will attempt to arrange for transfer to [facility].   LOS: 4 days   ZEHR, JESSICA D.  11/16/2015, 11:26 AM  Pager number [phone]   Attending physician's note   I have taken an interval history, reviewed the chart and examined the patient. I agree with the Advanced Practitioner's note, impression and recommendations.  Continues to have significant abdominal pain and distention. He is currently on bowel rest on TPN.  CT suggestive of complex pancreatitis with multiple pseudocysts with one of them possibly hemorrhagic; he also has left pleural effusion  with debris and with elevated white cell and neutrophil count concerning for para pneumonic effusion vs related to acute pancreatitis.  Start broad spectrum antibiotics: as per pharmacy team (cipro + flagyl or carbapenem) Pain control as needed. Continue TPN Ok to do ice chips or small sips of water Glycerin suppository as needed If does not show any significant clinical improvement, we'll have to consider repeat imaging (MRCP) in next few days. There is also a question of pancreatic duct leak and if continues to have significant pancreatic ascites, may need  stenting of pancreatic duct.  Damaris Hippo, MD [phone] Mon-Fri 8a-5p [phone] after 5p, weekends, holidays

## 2015-11-16 NOTE — Progress Notes (Signed)
PARENTERAL NUTRITION CONSULT NOTE  Pharmacy Consult for TPN Indication: severe pancreatitis and unable to place feeding tube  Allergies  Allergen Reactions  . Azithromycin Anaphylaxis and Swelling    Throat swelling, body swelling  . Lisinopril Anaphylaxis, Hives and Swelling    REACTION: facial/neck edema   Patient Measurements: Height: 5\' 8"  (172.7 cm) Weight: 128 lb 6.4 oz (58.242 kg) IBW/kg (Calculated) : 68.4  Vital Signs:   Intake/Output from previous day: 02/05 0701 - 02/06 0700 In: 0  Out: 1100 [Urine:1100] Intake/Output from this shift:  Labs:  Recent Labs  11/13/15 1033  11/14/15 1607 11/15/15 0656 11/16/15 0515  WBC 14.9*  < > 12.9* 13.5* 13.7*  HGB 7.3*  < > 10.1* 9.5* 9.6*  HCT 23.6*  < > 31.6* 29.3* 30.0*  PLT 593*  < > 505* 485* 497*  INR 1.35  --   --   --   --   < > = values in this interval not displayed.  Recent Labs  11/14/15 1607 11/15/15 0656 11/16/15 0515  NA 141 138 136  K 3.9 4.0 3.2*  CL 107 107 103  CO2 22 19* 25  GLUCOSE 86 93 117*  BUN 7 7 <5*  CREATININE 0.65 0.69 0.56*  CALCIUM 8.0* 7.6* 7.7*  MG 1.5* 1.7 1.6*  PHOS 3.0 3.3 3.5  PROT  --  4.7* 4.9*  ALBUMIN  --  1.7* 1.6*  AST  --  32 16  ALT  --  13* 12*  ALKPHOS  --  50 48  BILITOT  --  1.8* 0.9  PREALBUMIN  --   --  4.2*  TRIG  --   --  74   Estimated Creatinine Clearance: 82.9 mL/min (by C-G formula based on Cr of 0.56).   Recent Labs  11/14/15 0609 11/15/15 0556 11/16/15 0608  GLUCAP 90 70 108*   Medical History: Insulin Requirements in the past 24 hours:  No SSI  Current Nutrition:  2/6 prealbumin 4.2 - severe PCM  NPO - Protein-calorie malnutrition with weight loss Clinimix E 5/15 at 40 ml/hr + IVFE at 10 ml/hr  Assessment: 60 yo male admitted with c/o abdominal pain.  He has chronic pancreatitis, poor nutrition habits, hx. of alcohol use, liver cirrhosis as well as other chronic co-morbidities.  Admitted with severe acute on chronic alcoholic  pancreatitis with ascites and questionable pancreatic duct leak. A feeding tube was attempted by the staff and interventional radiology and could not be passed by the GE junction.  A PICC was placed and pharmacy consulted to dose TPN for nutrition support. LABS: - k 3.2, mag 1.6 after 2 gm bolus yesterday, phos 3.5, CorCa 9.62  ENDO: - He has a history of diabetes but is on no medications and his last HgbA1c was normal (5.2 in 2014). No SSI. Serum glucose 117 and CBG 108. GI: - Hx. of GERD, chronic pancreatitis. Now with severe acute on chronic alcoholic pancreatitis w/ ascites and questionable pancreatic duct leak.  Bowel rest, TPN for nutrition support as unable to pass FT.  Renal: - Creat. 0.56, est crcl ~ 54ml/min. NS at 50 ml/hr UOP 0.8 ml/kg/hr Hepatic: alb 1.6; LFTs OK, Trig 74,   Nutritional Goals: per RD 2/3 1800-2000 kCal, 100-115 grams of protein per day per RD  Plan:  - continue Clinimix 5/15 E at 55ml/hr (1161kcal with lipids and 48 gm protein) - continue Lipids 10% at 2ml/hr -once electrolytes are stable, plan to titrate Clinimix to goal rate of 79ml/hr  to provide a total of 1894 kcal and ~ 100 gm of protein.   - taking PO MVI, thiamine, FA so will not put in TPN - Monitor CBG and start SSI if needed - 4 gm mag followed by 5 runs of K  - Monitor for s/s of re-feeding syndrome  - KVO fluids today  Eudelia Bunch, Pharm.D. [phone] 11/16/2015 8:23 AM

## 2015-11-16 NOTE — Progress Notes (Signed)
ANTICOAGULATION CONSULT NOTE - Follow Up Consult  Pharmacy Consult for Heparin + Rocephin Indication: thrombus in common iliac artery + SBP  Allergies  Allergen Reactions  . Azithromycin Anaphylaxis and Swelling    Throat swelling, body swelling  . Lisinopril Anaphylaxis, Hives and Swelling    REACTION: facial/neck edema    Patient Measurements: Height: 5\' 8"  (172.7 cm) Weight: 128 lb 6.4 oz (58.242 kg) IBW/kg (Calculated) : 68.4 Heparin Dosing Weight: 58.2 kg  Vital Signs: Temp: 98.7 F (37.1 C) (02/06 0927) Temp Source: Oral (02/06 0927) BP: 106/71 mmHg (02/06 0927) Pulse Rate: 97 (02/06 0927)  Labs:  Recent Labs  11/14/15 1607 11/15/15 0656 11/16/15 0515 11/16/15 0516 11/16/15 1400  HGB 10.1* 9.5* 9.6*  --   --   HCT 31.6* 29.3* 30.0*  --   --   PLT 505* 485* 497*  --   --   HEPARINUNFRC 0.15* 0.37  --  0.22* 0.33  CREATININE 0.65 0.69 0.56*  --   --     Estimated Creatinine Clearance: 82.9 mL/min (by C-G formula based on Cr of 0.56).  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 Starting heparin per pharmacy. Noted one pancreatic pseudocyst with some hemorrhagic contents/Hgb 9.5 (monitor closely).   Anticoagulation: Heparin for thrombus in common iliac artery>>consider embolectomy if he develops ischemia of left foot. Not a candidate for long term AC.Hgb 6.8>>10>>9.5>9.6 after 2 units, Plts 497, no overt bleeding noted. HL 0.22>0.33.    Goal of Therapy:  Heparin level 0.3-0.7 units/ml Monitor platelets by anticoagulation protocol: Yes   Plan:  Add Rocephin 1g IV q24h Continue Iv heparin at 1450 units/hr Recheck level in 6 hrs.    Esaw Knippel S. Alford Highland, PharmD, BCPS Clinical Staff Pharmacist Pager [phone]  Eilene Ghazi Stillinger 11/16/2015,2:50 PM

## 2015-11-16 NOTE — Progress Notes (Addendum)
  [facility] Progress Note  Subjective  - feels better, no pain/coldness or unexpected numbness in feet bilaterally; no other concerns or complaints at this time   Objective Filed Vitals:   11/15/15 1346 11/15/15 1950  BP: 123/67 123/74  Pulse: 93 98  Temp: 97.6 F (36.4 C) 98.6 F (37 C)  Resp: 17 18    Intake/Output Summary (Last 24 hours) at 11/16/15 0804 Last data filed at 11/16/15 [id number]  Gross per 24 hour  Intake      0 ml  Output    900 ml  Net   -900 ml   DP pulses 2+ bilaterally Feet are warm and well-perfused No loss of sensation in feet  Assessment/Planning: Iliac thrombus Tolerating heparin currently Hemoglobin is 9.6 Informed patient to let staff know if he experiences a sudden pain, coolness or loss of sensation in lower extremities  Checkovich, Kayla 11/16/2015 8:04 AM --  Addendum No ischemia of left foot. Palpable left DP pulse.  Continue heparin.  Hgb stable.   Virgina Jock, PA-C  Agree with above.  Pt currently remains asymptomatic.  Scheduled for transfer to [facility] this evening per pt. Will follow  Ruta Hinds, MD [facility] [place] Office: [phone] Pager: [phone]   Laboratory CBC    Component Value Date/Time   WBC 13.7* 11/16/2015 0515   HGB 9.6* 11/16/2015 0515   HCT 30.0* 11/16/2015 0515   PLT 497* 11/16/2015 0515    BMET    Component Value Date/Time   NA 136 11/16/2015 0515   K 3.2* 11/16/2015 0515   CL 103 11/16/2015 0515   CO2 25 11/16/2015 0515   GLUCOSE 117* 11/16/2015 0515   BUN <5* 11/16/2015 0515   CREATININE 0.56* 11/16/2015 0515   CREATININE 1.03 12/26/2014 0907   CALCIUM 7.7* 11/16/2015 0515   GFRNONAA >60 11/16/2015 0515   GFRAA >60 11/16/2015 0515    COAG Lab Results  Component Value Date   INR 1.35 11/13/2015   INR 1.23 11/02/2015   INR 1.07 12/26/2014   No results found for: PTT  Antibiotics Anti-infectives    None       Kayla  Checkovich, PA-S

## 2015-11-16 NOTE — Progress Notes (Signed)
Ward for heparin Indication: splenic infarct, left common iliac artery thrombus  Allergies  Allergen Reactions  . Azithromycin Anaphylaxis and Swelling    Throat swelling, body swelling  . Lisinopril Anaphylaxis, Hives and Swelling    REACTION: facial/neck edema    Patient Measurements: Height: 5\' 8"  (172.7 cm) Weight: 128 lb 6.4 oz (58.242 kg) IBW/kg (Calculated) : 68.4 Heparin Dosing Weight: 53.8 kg  Vital Signs: Temp: 98.6 F (37 C) (02/05 1950) Temp Source: Oral (02/05 1950) BP: 123/74 mmHg (02/05 1950) Pulse Rate: 98 (02/05 1950)  Labs:  Recent Labs  11/13/15 0706 11/13/15 1033  11/14/15 1607 11/15/15 0656 11/16/15 0515 11/16/15 0516  HGB 7.6* 7.3*  < > 10.1* 9.5* 9.6*  --   HCT 24.1* 23.6*  < > 31.6* 29.3* 30.0*  --   PLT 543* 593*  < > 505* 485* 497*  --   LABPROT  --  16.8*  --   --   --   --   --   INR  --  1.35  --   --   --   --   --   HEPARINUNFRC 0.33  --   < > 0.15* 0.37  --  0.22*  CREATININE 0.97  --   --  0.65 0.69  --   --   < > = values in this interval not displayed.  Estimated Creatinine Clearance: 82.9 mL/min (by C-G formula based on Cr of 0.69).  Assessment: 60 yo male with left common iliac artery thrombus for heparin.   Goal of Therapy:  Heparin level 0.3-0.7 units/ml Monitor platelets by anticoagulation protocol: Yes   Plan:  Increase Heparin 1450 units/hr Check heparin level in 8 hours.  Earlena Werst, Bronson Curb 11/16/2015,5:35 AM

## 2015-11-16 NOTE — Progress Notes (Signed)
Nutrition Consult/Follow Up  DOCUMENTATION CODES:   Severe malnutrition in context of chronic illness, Underweight  INTERVENTION:    TPN per pharmacy  NUTRITION DIAGNOSIS:   Inadequate oral intake related to inability to eat as evidenced by NPO status, ongoing  GOAL:   Patient will meet greater than or equal to 90% of their needs, progressing  MONITOR:   Diet advancement, Labs, Weight trends, I & O's, TPN prescription  ASSESSMENT:   60 Y/O M with PMX of HLD, chronic pancreatitis, HTN, Alcoholic liver cirrhosis, hx of CVA, Anemia, ED, cardiac arrest sinus tachycardia, depression, recent hospitalization for acute on chronic pancreatitis discharged few weeks ago. He presented with hx of abdominal pain on going for more than 3 wks. He stated he never got relieved since his last hospitalization. He described his pain as dull in nature about 8/10 in severity relieved by oxycodone which he was discharged home on during last hospitalization. He denies N/V, no constipation, but he developed some diarrhea 2 days ago. His appetite is poor and he has lost few pounds over the last few months. He endorse fatigue and weakness worsening since last hospital admission and discharge. He denies fever. No other concern.  RD consulted for new TPN.  [place] team and IR unable to place feeding tube past GE junction.  Patient is receiving TPN with Clinimix E 5/15 @ 40 ml/hr via PICC line.  Lipids (20% IVFE) @ 10 ml/hr.  Pt taking MVI, thiamine, folvite PO daily. TPN prescription is providing 1162 kcal and 48 grams protein daily (based on weekly average).  Meets 65% minimum estimated kcal and 48% minimum estimated protein needs.  Diet Order:  Diet NPO time specified Except for: Sips with Meds TPN (CLINIMIX-E) Adult TPN (CLINIMIX-E) Adult  Skin:  Reviewed, no issues  Last BM:  2/4  Height:   Ht Readings from Last 1 Encounters:  11/13/15 5\' 8"  (1.727 m)    Weight:   Wt Readings from Last 1  Encounters:  11/15/15 128 lb 6.4 oz (58.242 kg)    Ideal Body Weight:  70 kg  BMI:  Body mass index is 19.53 kg/(m^2).  Estimated Nutritional Needs:   Kcal:  1800-2000  Protein:  100-115 gm  Fluid:  1.8-2.0 L  EDUCATION NEEDS:   No education needs identified at this time  Arthur Holms, RD, LDN Pager #: [phone] After-Hours Pager #: [phone]

## 2015-11-16 NOTE — Consult Note (Signed)
   [facility] Inpatient Consult   11/16/2015  Steven Frey [birth year] [id number]   Received notification from [facility] that patient is admitted. Went to bedside to speak with him about [facility] follow up. He remains agreeable for [facility] Care Management follow up. [facility] Care Management program explained and written consent signed. [name] endorses he is able to afford his medications, he lives with his father, but sometimes has issues with transportation. Will request for [facility] RNCM for possible need for [facility] Licensed CSW consult upon transition of care call. Confirmed best contact number as [phone]. Confirmed Primary Care MD is at [facility]. Emergency contacts given for patient are his sisters- Alita Chyle [phone] and Cynda Familia [phone]. [facility] Care Management packet left at bedside. Inpatient RNCM aware [facility] to follow patient.  Marthenia Rolling, MSN-Ed, RN,BSN [facility] Liaison [phone]

## 2015-11-16 NOTE — Patient Outreach (Signed)
initial telephone contact with patient cancelled due to patient's current status as inpatient.  Will follow disposition, communicate with [facility].

## 2015-11-17 LAB — TYPE AND SCREEN
ABO/RH(D): B POS
Antibody Screen: NEGATIVE
Unit division: 0
Unit division: 0
Unit division: 0

## 2015-11-17 LAB — CHOLESTEROL, BODY FLUID: Cholesterol, Fluid: 52 mg/dL

## 2015-11-17 NOTE — Care Management Note (Signed)
Case Management Note Marvetta Gibbons RN, BSN Unit 2W-Case Manager [phone]  Patient Details  Name: Steven Frey MRN: [id number] Date of Birth: 1956-07-26  Subjective/Objective:   Pt admitted with pancreatitis, and acute splenic infarct                 Action/Plan: PTA pt was staying with his sister-[name], was active with [facility]- for HH-RN/PT/OT/MSW/aide- will need resumption orders at time of discharge- RN CM will follow  Expected Discharge Date:    11/16/15              Expected Discharge Plan:  Home/Home Health  In-House Referral:     Discharge planning Services  CM Consult  Post Acute Care Choice:  Home Health, Resumption of Svcs/PTA Provider Choice offered to:  Patient  DME Arranged:    DME Agency:     HH Arranged:    [place] Agency:  [facility]  Status of Service:  Completed, signed off  Medicare Important Message Given:  Yes Date Medicare IM Given:    Medicare IM give by:    Date Additional Medicare IM Given:    Additional Medicare Important Message give by:     If discussed at [place] of Stay Meetings, dates discussed:    Discharge Disposition: Acute/acute tx   Additional Comments:  11/17/15- pt tx to [facility] via [place] on 11/16/15   Dawayne Patricia, RN 11/17/2015, 11:20 AM

## 2015-11-18 LAB — CULTURE, BODY FLUID-BOTTLE: Culture: NO GROWTH

## 2015-11-18 LAB — CULTURE, BODY FLUID W GRAM STAIN -BOTTLE

## 2015-11-19 ENCOUNTER — Other Ambulatory Visit: Payer: Self-pay

## 2015-11-19 ENCOUNTER — Other Ambulatory Visit: Payer: Self-pay | Admitting: Student

## 2015-11-19 ENCOUNTER — Other Ambulatory Visit: Payer: Self-pay | Admitting: Family Medicine

## 2015-11-19 NOTE — Patient Outreach (Signed)
This RNCM made call to follow up with patient [phone]. Was told by gentleman who answered the telephone patient had been transferred to [place]: Follow up with patient via telephone on February 15

## 2015-11-23 ENCOUNTER — Other Ambulatory Visit: Payer: Self-pay

## 2015-11-23 NOTE — Patient Outreach (Signed)
Call made to [facility] to check on disposition. This CM was advised that patient is in ICU.  Plan: Discharge this patient as he being followed by another case management group.

## 2015-11-25 ENCOUNTER — Other Ambulatory Visit: Payer: Self-pay

## 2015-11-25 NOTE — Patient Outreach (Signed)
This RNCM spoke with patient's father this morning by telephone. Mr. Steven Frey, Sr. Advised this RNCM of patient's date of birth and address. Mr. Echelbarger also advised this RNCM patient remains inpatient at [facility].   Plan: Follow up on December 02, 2015 for information on discharge

## 2015-12-03 ENCOUNTER — Ambulatory Visit: Payer: Medicare Other

## 2015-12-04 ENCOUNTER — Other Ambulatory Visit: Payer: Self-pay | Admitting: Student

## 2015-12-08 ENCOUNTER — Ambulatory Visit: Payer: Medicare Other

## 2015-12-13 ENCOUNTER — Other Ambulatory Visit: Payer: Self-pay | Admitting: Family Medicine

## 2015-12-16 DIAGNOSIS — K219 Gastro-esophageal reflux disease without esophagitis: Secondary | ICD-10-CM | POA: Diagnosis not present

## 2015-12-16 DIAGNOSIS — G4089 Other seizures: Secondary | ICD-10-CM | POA: Diagnosis not present

## 2015-12-16 DIAGNOSIS — K860 Alcohol-induced chronic pancreatitis: Secondary | ICD-10-CM | POA: Diagnosis not present

## 2015-12-16 DIAGNOSIS — I509 Heart failure, unspecified: Secondary | ICD-10-CM | POA: Diagnosis not present

## 2015-12-16 DIAGNOSIS — K5900 Constipation, unspecified: Secondary | ICD-10-CM | POA: Diagnosis not present

## 2015-12-16 DIAGNOSIS — Z7409 Other reduced mobility: Secondary | ICD-10-CM | POA: Diagnosis not present

## 2015-12-16 DIAGNOSIS — R000 Tachycardia, unspecified: Secondary | ICD-10-CM | POA: Diagnosis not present

## 2015-12-16 DIAGNOSIS — I959 Hypotension, unspecified: Secondary | ICD-10-CM | POA: Diagnosis not present

## 2015-12-16 DIAGNOSIS — N179 Acute kidney failure, unspecified: Secondary | ICD-10-CM | POA: Diagnosis not present

## 2015-12-16 DIAGNOSIS — R262 Difficulty in walking, not elsewhere classified: Secondary | ICD-10-CM | POA: Diagnosis not present

## 2015-12-16 DIAGNOSIS — D649 Anemia, unspecified: Secondary | ICD-10-CM | POA: Diagnosis not present

## 2015-12-16 DIAGNOSIS — G40909 Epilepsy, unspecified, not intractable, without status epilepticus: Secondary | ICD-10-CM | POA: Diagnosis not present

## 2015-12-16 DIAGNOSIS — K859 Acute pancreatitis without necrosis or infection, unspecified: Secondary | ICD-10-CM | POA: Diagnosis not present

## 2015-12-16 DIAGNOSIS — I10 Essential (primary) hypertension: Secondary | ICD-10-CM | POA: Diagnosis not present

## 2015-12-16 DIAGNOSIS — Q453 Other congenital malformations of pancreas and pancreatic duct: Secondary | ICD-10-CM | POA: Diagnosis not present

## 2015-12-16 DIAGNOSIS — R41841 Cognitive communication deficit: Secondary | ICD-10-CM | POA: Diagnosis not present

## 2015-12-16 DIAGNOSIS — R9431 Abnormal electrocardiogram [ECG] [EKG]: Secondary | ICD-10-CM | POA: Diagnosis not present

## 2015-12-16 DIAGNOSIS — E785 Hyperlipidemia, unspecified: Secondary | ICD-10-CM | POA: Diagnosis not present

## 2015-12-16 DIAGNOSIS — R748 Abnormal levels of other serum enzymes: Secondary | ICD-10-CM | POA: Diagnosis not present

## 2015-12-16 DIAGNOSIS — R634 Abnormal weight loss: Secondary | ICD-10-CM | POA: Diagnosis not present

## 2015-12-16 DIAGNOSIS — E871 Hypo-osmolality and hyponatremia: Secondary | ICD-10-CM | POA: Diagnosis not present

## 2015-12-16 DIAGNOSIS — I252 Old myocardial infarction: Secondary | ICD-10-CM | POA: Diagnosis not present

## 2015-12-16 DIAGNOSIS — I493 Ventricular premature depolarization: Secondary | ICD-10-CM | POA: Diagnosis not present

## 2015-12-16 DIAGNOSIS — R1319 Other dysphagia: Secondary | ICD-10-CM | POA: Diagnosis not present

## 2015-12-16 DIAGNOSIS — K7030 Alcoholic cirrhosis of liver without ascites: Secondary | ICD-10-CM | POA: Diagnosis not present

## 2015-12-16 DIAGNOSIS — I2510 Atherosclerotic heart disease of native coronary artery without angina pectoris: Secondary | ICD-10-CM | POA: Diagnosis not present

## 2015-12-16 DIAGNOSIS — K7460 Unspecified cirrhosis of liver: Secondary | ICD-10-CM | POA: Diagnosis not present

## 2015-12-16 DIAGNOSIS — D72829 Elevated white blood cell count, unspecified: Secondary | ICD-10-CM | POA: Diagnosis not present

## 2015-12-16 DIAGNOSIS — R404 Transient alteration of awareness: Secondary | ICD-10-CM | POA: Diagnosis not present

## 2015-12-16 DIAGNOSIS — J948 Other specified pleural conditions: Secondary | ICD-10-CM | POA: Diagnosis not present

## 2015-12-16 DIAGNOSIS — J441 Chronic obstructive pulmonary disease with (acute) exacerbation: Secondary | ICD-10-CM | POA: Diagnosis not present

## 2015-12-16 DIAGNOSIS — I639 Cerebral infarction, unspecified: Secondary | ICD-10-CM | POA: Diagnosis not present

## 2015-12-16 DIAGNOSIS — R197 Diarrhea, unspecified: Secondary | ICD-10-CM | POA: Diagnosis not present

## 2015-12-16 DIAGNOSIS — F329 Major depressive disorder, single episode, unspecified: Secondary | ICD-10-CM | POA: Diagnosis not present

## 2015-12-16 DIAGNOSIS — M6281 Muscle weakness (generalized): Secondary | ICD-10-CM | POA: Diagnosis not present

## 2015-12-16 DIAGNOSIS — Z8673 Personal history of transient ischemic attack (TIA), and cerebral infarction without residual deficits: Secondary | ICD-10-CM | POA: Diagnosis not present

## 2015-12-16 DIAGNOSIS — R1312 Dysphagia, oropharyngeal phase: Secondary | ICD-10-CM | POA: Diagnosis not present

## 2015-12-16 DIAGNOSIS — R42 Dizziness and giddiness: Secondary | ICD-10-CM | POA: Diagnosis not present

## 2015-12-16 DIAGNOSIS — R55 Syncope and collapse: Secondary | ICD-10-CM | POA: Diagnosis not present

## 2015-12-16 DIAGNOSIS — K861 Other chronic pancreatitis: Secondary | ICD-10-CM | POA: Diagnosis not present

## 2015-12-16 DIAGNOSIS — F17210 Nicotine dependence, cigarettes, uncomplicated: Secondary | ICD-10-CM | POA: Diagnosis not present

## 2015-12-16 DIAGNOSIS — R110 Nausea: Secondary | ICD-10-CM | POA: Diagnosis not present

## 2015-12-18 LAB — AFB CULTURE WITH SMEAR (NOT AT ARMC): Acid Fast Smear: NONE SEEN

## 2015-12-25 ENCOUNTER — Other Ambulatory Visit: Payer: Self-pay | Admitting: Student

## 2016-01-13 DIAGNOSIS — K7460 Unspecified cirrhosis of liver: Secondary | ICD-10-CM | POA: Diagnosis not present

## 2016-01-13 DIAGNOSIS — I639 Cerebral infarction, unspecified: Secondary | ICD-10-CM | POA: Diagnosis not present

## 2016-01-13 DIAGNOSIS — K859 Acute pancreatitis without necrosis or infection, unspecified: Secondary | ICD-10-CM | POA: Diagnosis not present

## 2016-01-18 DIAGNOSIS — R9431 Abnormal electrocardiogram [ECG] [EKG]: Secondary | ICD-10-CM | POA: Diagnosis not present

## 2016-01-18 DIAGNOSIS — I959 Hypotension, unspecified: Secondary | ICD-10-CM | POA: Diagnosis not present

## 2016-01-18 DIAGNOSIS — I493 Ventricular premature depolarization: Secondary | ICD-10-CM | POA: Diagnosis not present

## 2016-01-18 DIAGNOSIS — Q453 Other congenital malformations of pancreas and pancreatic duct: Secondary | ICD-10-CM | POA: Diagnosis not present

## 2016-01-18 DIAGNOSIS — K860 Alcohol-induced chronic pancreatitis: Secondary | ICD-10-CM | POA: Diagnosis not present

## 2016-01-18 DIAGNOSIS — K859 Acute pancreatitis without necrosis or infection, unspecified: Secondary | ICD-10-CM | POA: Diagnosis not present

## 2016-01-18 DIAGNOSIS — D72829 Elevated white blood cell count, unspecified: Secondary | ICD-10-CM | POA: Diagnosis not present

## 2016-01-18 DIAGNOSIS — K7030 Alcoholic cirrhosis of liver without ascites: Secondary | ICD-10-CM | POA: Diagnosis not present

## 2016-01-18 DIAGNOSIS — I2510 Atherosclerotic heart disease of native coronary artery without angina pectoris: Secondary | ICD-10-CM | POA: Diagnosis not present

## 2016-01-18 DIAGNOSIS — R55 Syncope and collapse: Secondary | ICD-10-CM | POA: Diagnosis not present

## 2016-01-18 DIAGNOSIS — E871 Hypo-osmolality and hyponatremia: Secondary | ICD-10-CM | POA: Diagnosis not present

## 2016-01-18 DIAGNOSIS — E785 Hyperlipidemia, unspecified: Secondary | ICD-10-CM | POA: Diagnosis not present

## 2016-01-18 DIAGNOSIS — I10 Essential (primary) hypertension: Secondary | ICD-10-CM | POA: Diagnosis not present

## 2016-01-18 DIAGNOSIS — G40909 Epilepsy, unspecified, not intractable, without status epilepticus: Secondary | ICD-10-CM | POA: Diagnosis not present

## 2016-01-18 DIAGNOSIS — N179 Acute kidney failure, unspecified: Secondary | ICD-10-CM | POA: Diagnosis not present

## 2016-01-18 DIAGNOSIS — R42 Dizziness and giddiness: Secondary | ICD-10-CM | POA: Diagnosis not present

## 2016-01-19 DIAGNOSIS — R9431 Abnormal electrocardiogram [ECG] [EKG]: Secondary | ICD-10-CM | POA: Diagnosis not present

## 2016-01-19 DIAGNOSIS — D72829 Elevated white blood cell count, unspecified: Secondary | ICD-10-CM | POA: Diagnosis not present

## 2016-01-19 DIAGNOSIS — R918 Other nonspecific abnormal finding of lung field: Secondary | ICD-10-CM | POA: Diagnosis not present

## 2016-01-20 DIAGNOSIS — F339 Major depressive disorder, recurrent, unspecified: Secondary | ICD-10-CM | POA: Diagnosis not present

## 2016-01-20 DIAGNOSIS — I959 Hypotension, unspecified: Secondary | ICD-10-CM | POA: Diagnosis not present

## 2016-01-20 DIAGNOSIS — I639 Cerebral infarction, unspecified: Secondary | ICD-10-CM | POA: Diagnosis not present

## 2016-01-20 DIAGNOSIS — D72829 Elevated white blood cell count, unspecified: Secondary | ICD-10-CM | POA: Diagnosis not present

## 2016-01-20 DIAGNOSIS — M6281 Muscle weakness (generalized): Secondary | ICD-10-CM | POA: Diagnosis not present

## 2016-01-20 DIAGNOSIS — F0391 Unspecified dementia with behavioral disturbance: Secondary | ICD-10-CM | POA: Diagnosis not present

## 2016-01-20 DIAGNOSIS — K7030 Alcoholic cirrhosis of liver without ascites: Secondary | ICD-10-CM | POA: Diagnosis not present

## 2016-01-20 DIAGNOSIS — R262 Difficulty in walking, not elsewhere classified: Secondary | ICD-10-CM | POA: Diagnosis not present

## 2016-01-20 DIAGNOSIS — J441 Chronic obstructive pulmonary disease with (acute) exacerbation: Secondary | ICD-10-CM | POA: Diagnosis not present

## 2016-01-20 DIAGNOSIS — R350 Frequency of micturition: Secondary | ICD-10-CM | POA: Diagnosis not present

## 2016-01-20 DIAGNOSIS — K861 Other chronic pancreatitis: Secondary | ICD-10-CM | POA: Diagnosis not present

## 2016-01-20 DIAGNOSIS — E871 Hypo-osmolality and hyponatremia: Secondary | ICD-10-CM | POA: Diagnosis not present

## 2016-01-20 DIAGNOSIS — K860 Alcohol-induced chronic pancreatitis: Secondary | ICD-10-CM | POA: Diagnosis not present

## 2016-01-20 DIAGNOSIS — Z7189 Other specified counseling: Secondary | ICD-10-CM | POA: Diagnosis not present

## 2016-01-20 DIAGNOSIS — R1319 Other dysphagia: Secondary | ICD-10-CM | POA: Diagnosis not present

## 2016-01-20 DIAGNOSIS — G40909 Epilepsy, unspecified, not intractable, without status epilepticus: Secondary | ICD-10-CM | POA: Diagnosis not present

## 2016-01-20 DIAGNOSIS — R358 Other polyuria: Secondary | ICD-10-CM | POA: Diagnosis not present

## 2016-01-20 DIAGNOSIS — K859 Acute pancreatitis without necrosis or infection, unspecified: Secondary | ICD-10-CM | POA: Diagnosis not present

## 2016-01-20 DIAGNOSIS — N179 Acute kidney failure, unspecified: Secondary | ICD-10-CM | POA: Diagnosis not present

## 2016-01-20 DIAGNOSIS — R278 Other lack of coordination: Secondary | ICD-10-CM | POA: Diagnosis not present

## 2016-01-20 DIAGNOSIS — I10 Essential (primary) hypertension: Secondary | ICD-10-CM | POA: Diagnosis not present

## 2016-01-20 DIAGNOSIS — R41841 Cognitive communication deficit: Secondary | ICD-10-CM | POA: Diagnosis not present

## 2016-01-20 DIAGNOSIS — J948 Other specified pleural conditions: Secondary | ICD-10-CM | POA: Diagnosis not present

## 2016-01-20 DIAGNOSIS — F39 Unspecified mood [affective] disorder: Secondary | ICD-10-CM | POA: Diagnosis not present

## 2016-01-20 DIAGNOSIS — R55 Syncope and collapse: Secondary | ICD-10-CM | POA: Diagnosis not present

## 2016-01-20 DIAGNOSIS — I2510 Atherosclerotic heart disease of native coronary artery without angina pectoris: Secondary | ICD-10-CM | POA: Diagnosis not present

## 2016-01-27 DIAGNOSIS — I639 Cerebral infarction, unspecified: Secondary | ICD-10-CM | POA: Diagnosis not present

## 2016-01-27 DIAGNOSIS — E871 Hypo-osmolality and hyponatremia: Secondary | ICD-10-CM | POA: Diagnosis not present

## 2016-01-27 DIAGNOSIS — G40909 Epilepsy, unspecified, not intractable, without status epilepticus: Secondary | ICD-10-CM | POA: Diagnosis not present

## 2016-01-27 DIAGNOSIS — I2510 Atherosclerotic heart disease of native coronary artery without angina pectoris: Secondary | ICD-10-CM | POA: Diagnosis not present

## 2016-01-27 DIAGNOSIS — K861 Other chronic pancreatitis: Secondary | ICD-10-CM | POA: Diagnosis not present

## 2016-01-27 DIAGNOSIS — R55 Syncope and collapse: Secondary | ICD-10-CM | POA: Diagnosis not present

## 2016-03-02 DIAGNOSIS — I639 Cerebral infarction, unspecified: Secondary | ICD-10-CM | POA: Diagnosis not present

## 2016-03-02 DIAGNOSIS — K859 Acute pancreatitis without necrosis or infection, unspecified: Secondary | ICD-10-CM | POA: Diagnosis not present

## 2016-03-02 DIAGNOSIS — I2510 Atherosclerotic heart disease of native coronary artery without angina pectoris: Secondary | ICD-10-CM | POA: Diagnosis not present

## 2016-03-10 ENCOUNTER — Ambulatory Visit (INDEPENDENT_AMBULATORY_CARE_PROVIDER_SITE_OTHER): Payer: Medicare Other | Admitting: Family Medicine

## 2016-03-10 ENCOUNTER — Encounter: Payer: Self-pay | Admitting: Family Medicine

## 2016-03-10 VITALS — BP 137/91 | HR 113 | Temp 98.0°F | Ht 68.0 in | Wt 143.0 lb

## 2016-03-10 DIAGNOSIS — R350 Frequency of micturition: Secondary | ICD-10-CM | POA: Diagnosis not present

## 2016-03-10 DIAGNOSIS — G8929 Other chronic pain: Secondary | ICD-10-CM

## 2016-03-10 DIAGNOSIS — Z7189 Other specified counseling: Secondary | ICD-10-CM | POA: Diagnosis not present

## 2016-03-10 DIAGNOSIS — R358 Other polyuria: Secondary | ICD-10-CM | POA: Diagnosis not present

## 2016-03-10 DIAGNOSIS — R3589 Other polyuria: Secondary | ICD-10-CM

## 2016-03-10 DIAGNOSIS — K861 Other chronic pancreatitis: Secondary | ICD-10-CM | POA: Diagnosis not present

## 2016-03-10 MED ORDER — OXYCODONE-ACETAMINOPHEN 5-325 MG PO TABS: 1.0000 | ORAL_TABLET | Freq: Two times a day (BID) | ORAL | Status: DC | PRN

## 2016-03-10 NOTE — Patient Instructions (Signed)
Checking bloodwork today Call the stomach doctor at [facility] for an appointment in July [phone]  Refilled percocet for one month Only twice a day for severe pain Will not be doing this long term  See me in 1 month, sooner if needed  Be well, Dr. Ardelia Mems

## 2016-03-10 NOTE — Progress Notes (Signed)
Date of Visit: 03/10/2016   HPI:  Patient presents to follow up after hospital/SNF discharge. He was discharged from a SNF today.  Review of records in [place] describes hospital admission at [facility] lasting about one month from February into March of this year. He was transferred to [facility] from [facility] with acute on chronic pancreatitis complicated by pancreatic duct disruption & peripancreatic fluid collections. Brief summary, abbreviated from discharge summary:  Underwent ERCP that was not successful, developed sepsis and was intubated for ARDS/multifocal pneumonia. Found to have L iliac artery & splenic vein thrombosis so was put on heparin gtt, but this was stopped after developing upper GI bleed, required multiple transfusions for anemia. Had thoracentesis for L pleural effusion that showed evidence of pancreatic ductal leak. 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 He eventually self-extubated, had chest tube placed, and was hemodynamically stable. Repeat CT showed stability of complications from pancreatitis. Instructed to follow up with GI in 2-4 weeks. Discharged to SNF on 12/16/15.  He followed up with [facility] GI clinic on 01/18/16. At that time still had abdominal pain, differential diagnosis included pain from chronic pancreatitis, constipation from opioids, or combination of these two. Ordered repeat CT scan and labs, but patient had syncopal episode during that visit while having blood drawn and was sent to the ER for persistent hypotension.  He was then readmitted at [facility] from 4/10-4/12/17. Syncope felt to be due to vasovagal episode/dehydration, workup essentially appeared negative. Did have mild AKI that improved with hydration. Discharged back to SNF facility.  He was discharged from there today. He feels well and able to live at home. He is living with his father,  whom he lived with prior to being hospitalized earlier this year. His only concern today is needing a refill on pain medicine. Reports taking percocet 5-325mg  three times daily while in the SNF. Has pain in arm, pain all over. Not able to localize pain to any specific etiology or location.   Other complaint is polyuria. Denies fevers, dysuria. Up multiple times during the night to urinate. Declines prostate exam today. Otherwise Stooling and urinating normally. Eating and drinking well.   Detailed medication reconciliation performed with our records & nursing home discharge papers he brought with him.  ROS: See HPI.  [place]: history of hyperlipidemia, anoxic brain injury, hypertension, cirrhosis, chronic pancreatitis, prior CVA, seizures  PHYSICAL EXAM: BP 137/91 mmHg  Pulse 113  Temp(Src) 98 F (36.7 C) (Oral)  Ht 5\' 8"  (1.727 m)  Wt 143 lb (64.864 kg)  BMI 21.75 kg/m2 Gen: NAD, pleasant, cooperative HEENT: normocephalic, atraumatic, moist mucous membranes  Heart: regular rate and rhythm, no murmur Lungs: clear to auscultation bilaterally, normal work of breathing Abdomen: soft, nontender to palpation   Neuro: alert, grossly nonfocal, speech normal Ext: No appreciable lower extremity edema bilaterally   ASSESSMENT/PLAN:  Encounter for chronic pain management Unfortunately he has now been started on a regimen of chronic narcotics while in the SNF. This will be a difficult cycle to break. Discussed with patient the risks of chronic narcotic therapy and that ideally we will need to taper him down. He is agreeable to refill percocet for twice daily use. rx given for 1 month supply. He will follow up with me in 1 month.   Chronic pancreatitis Stable presently. Encouraged him to schedule follow up with GI at [facility] - phone # provided.  Will check labs today: CMET, CBC.  Polyuria Urinary frequency without infectious symptoms  Suspect prostatic enlargement - though unable to fully  evaluate as patient refused rectal exam today Will check PSA given age, ethnicity, and prostate-related symptoms Follow up in 1 month    FOLLOW UP: Follow up in 1 mo for above issues  [place] J. Ardelia Mems, [place]

## 2016-03-11 DIAGNOSIS — I69351 Hemiplegia and hemiparesis following cerebral infarction affecting right dominant side: Secondary | ICD-10-CM | POA: Diagnosis not present

## 2016-03-11 DIAGNOSIS — R2681 Unsteadiness on feet: Secondary | ICD-10-CM | POA: Diagnosis not present

## 2016-03-12 DIAGNOSIS — R2681 Unsteadiness on feet: Secondary | ICD-10-CM | POA: Diagnosis not present

## 2016-03-12 DIAGNOSIS — I69351 Hemiplegia and hemiparesis following cerebral infarction affecting right dominant side: Secondary | ICD-10-CM | POA: Diagnosis not present

## 2016-03-14 DIAGNOSIS — R358 Other polyuria: Secondary | ICD-10-CM | POA: Insufficient documentation

## 2016-03-14 DIAGNOSIS — R3589 Other polyuria: Secondary | ICD-10-CM | POA: Insufficient documentation

## 2016-03-14 DIAGNOSIS — I69351 Hemiplegia and hemiparesis following cerebral infarction affecting right dominant side: Secondary | ICD-10-CM | POA: Diagnosis not present

## 2016-03-14 DIAGNOSIS — R2681 Unsteadiness on feet: Secondary | ICD-10-CM | POA: Diagnosis not present

## 2016-03-14 NOTE — Assessment & Plan Note (Signed)
Urinary frequency without infectious symptoms  Suspect prostatic enlargement - though unable to fully evaluate as patient refused rectal exam today Will check PSA given age, ethnicity, and prostate-related symptoms Follow up in 1 month

## 2016-03-14 NOTE — Assessment & Plan Note (Signed)
Unfortunately he has now been started on a regimen of chronic narcotics while in the SNF. This will be a difficult cycle to break. Discussed with patient the risks of chronic narcotic therapy and that ideally we will need to taper him down. He is agreeable to refill percocet for twice daily use. rx given for 1 month supply. He will follow up with me in 1 month.

## 2016-03-14 NOTE — Assessment & Plan Note (Signed)
Stable presently. Encouraged him to schedule follow up with GI at [facility] - phone # provided. Will check labs today: CMET, CBC.

## 2016-03-14 NOTE — Addendum Note (Signed)
Addended by: Leeanne Rio on: 03/14/2016 10:10 PM   Modules accepted: Orders

## 2016-03-14 NOTE — Progress Notes (Signed)
Addendum - labs were not drawn due to lab being closed by time visit was over. Will change orders to future. Patient was instructed by staff to schedule a lab visit.

## 2016-03-16 DIAGNOSIS — R2681 Unsteadiness on feet: Secondary | ICD-10-CM | POA: Diagnosis not present

## 2016-03-16 DIAGNOSIS — I69351 Hemiplegia and hemiparesis following cerebral infarction affecting right dominant side: Secondary | ICD-10-CM | POA: Diagnosis not present

## 2016-03-18 DIAGNOSIS — I69351 Hemiplegia and hemiparesis following cerebral infarction affecting right dominant side: Secondary | ICD-10-CM | POA: Diagnosis not present

## 2016-03-18 DIAGNOSIS — R2681 Unsteadiness on feet: Secondary | ICD-10-CM | POA: Diagnosis not present

## 2016-03-21 DIAGNOSIS — I69351 Hemiplegia and hemiparesis following cerebral infarction affecting right dominant side: Secondary | ICD-10-CM | POA: Diagnosis not present

## 2016-03-21 DIAGNOSIS — R2681 Unsteadiness on feet: Secondary | ICD-10-CM | POA: Diagnosis not present

## 2016-03-23 ENCOUNTER — Ambulatory Visit (INDEPENDENT_AMBULATORY_CARE_PROVIDER_SITE_OTHER): Payer: Medicare Other | Admitting: Family Medicine

## 2016-03-23 ENCOUNTER — Encounter: Payer: Self-pay | Admitting: Family Medicine

## 2016-03-23 VITALS — BP 126/70 | HR 71 | Temp 98.1°F | Wt 142.0 lb

## 2016-03-23 DIAGNOSIS — K861 Other chronic pancreatitis: Secondary | ICD-10-CM | POA: Diagnosis not present

## 2016-03-23 DIAGNOSIS — R2681 Unsteadiness on feet: Secondary | ICD-10-CM | POA: Diagnosis not present

## 2016-03-23 DIAGNOSIS — R21 Rash and other nonspecific skin eruption: Secondary | ICD-10-CM | POA: Diagnosis present

## 2016-03-23 DIAGNOSIS — I69351 Hemiplegia and hemiparesis following cerebral infarction affecting right dominant side: Secondary | ICD-10-CM | POA: Diagnosis not present

## 2016-03-23 DIAGNOSIS — R350 Frequency of micturition: Secondary | ICD-10-CM

## 2016-03-23 LAB — CBC
HCT: 35.6 % — ABNORMAL LOW (ref 38.5–50.0)
Hemoglobin: 11.6 g/dL — ABNORMAL LOW (ref 13.2–17.1)
MCH: 29.5 pg (ref 27.0–33.0)
MCHC: 32.6 g/dL (ref 32.0–36.0)
MCV: 90.6 fL (ref 80.0–100.0)
MPV: 11.1 fL (ref 7.5–12.5)
Platelets: 257 10*3/uL (ref 140–400)
RBC: 3.93 MIL/uL — ABNORMAL LOW (ref 4.20–5.80)
RDW: 14.3 % (ref 11.0–15.0)
WBC: 7.6 10*3/uL (ref 3.8–10.8)

## 2016-03-23 NOTE — Patient Instructions (Signed)
Use the itch cream on the spot on your arm Getting labs today Follow up with me in 2 weeks  Be well, Dr. Ardelia Mems

## 2016-03-23 NOTE — Progress Notes (Signed)
Date of Visit: 03/23/2016   HPI:  Patient presents today, mistakenly for an office visit. He actually just needed to have labs drawn. Is not supposed to see me for another 2 weeks.  He does have a question. Has a rash on his left forearm. Present a few days ago. Now improving. Itchy. Has not put anything on it. Has hydrocortisone cream at home.  Patient continues to decline prostate exam for his urinary frequency. Denies dysuria or fever. States he will consider prostate exam.  ROS: See HPI.  [place]: history of hypertension, hyperlipidemia, cirrhosis, chronic pancreatitis, ETOH, seizures  PHYSICAL EXAM: BP 126/70 mmHg  Pulse 71  Temp(Src) 98.1 F (36.7 C) (Oral)  Wt 142 lb (64.411 kg) Gen: NAD, pleasant, cooperative HEENT: normocephalic, atraumatic  Ext: left volar forearm with one mild papule, no other rashes seen, no skin breakdown  ASSESSMENT/PLAN:  Rash - suspect insect bite vs contact dermatitis. Improving. Recommend as needed hydrocortisone cream.  Urinary frequency - check PSA today with labs (this visit should have been just a lab visit). Re-address when he follows up with me in 2 weeks.   FOLLOW UP: Follow up in 2 weeks for routine medical problems  [place] J. Ardelia Mems, [place]

## 2016-03-24 DIAGNOSIS — R2681 Unsteadiness on feet: Secondary | ICD-10-CM | POA: Diagnosis not present

## 2016-03-24 DIAGNOSIS — I69351 Hemiplegia and hemiparesis following cerebral infarction affecting right dominant side: Secondary | ICD-10-CM | POA: Diagnosis not present

## 2016-03-24 LAB — COMPLETE METABOLIC PANEL WITH GFR
ALT: 13 U/L (ref 9–46)
AST: 18 U/L (ref 10–35)
Albumin: 4.2 g/dL (ref 3.6–5.1)
Alkaline Phosphatase: 70 U/L (ref 40–115)
BUN: 16 mg/dL (ref 7–25)
CO2: 15 mmol/L — ABNORMAL LOW (ref 20–31)
Calcium: 9.3 mg/dL (ref 8.6–10.3)
Chloride: 108 mmol/L (ref 98–110)
Creat: 0.89 mg/dL (ref 0.70–1.33)
GFR, Est African American: >89 mL/min (ref >=60)
GFR, Est Non African American: >89 mL/min (ref >=60)
Glucose, Bld: 98 mg/dL (ref 65–99)
Potassium: 4.2 mmol/L (ref 3.5–5.3)
Sodium: 140 mmol/L (ref 135–146)
Total Bilirubin: 0.5 mg/dL (ref 0.2–1.2)
Total Protein: 7.3 g/dL (ref 6.1–8.1)

## 2016-03-24 LAB — PSA: PSA: 0.59 ng/mL (ref <=4.00)

## 2016-03-25 ENCOUNTER — Telehealth: Payer: Self-pay | Admitting: Family Medicine

## 2016-03-25 DIAGNOSIS — E872 Acidosis, unspecified: Secondary | ICD-10-CM

## 2016-03-25 NOTE — Telephone Encounter (Addendum)
Called patient to discuss labwork Unremarkable except for bicarb of 15 with anion gap of 17 Unclear why he has this - patient reports feeling well. Eating and drinking well. Recommend recheck of BMET. He is not able to come in today due to transportation issues Lab appointment scheduled Monday morning at [facility] if he begins to feel sick this weekend (n/v, unable to eat) should go to urgent care or ED Patient understood and is appreciative.  Leeanne Rio, MD

## 2016-03-28 ENCOUNTER — Other Ambulatory Visit: Payer: Medicare Other

## 2016-03-28 DIAGNOSIS — E872 Acidosis, unspecified: Secondary | ICD-10-CM

## 2016-03-28 LAB — BASIC METABOLIC PANEL WITH GFR
BUN: 16 mg/dL (ref 7–25)
CO2: 25 mmol/L (ref 20–31)
Calcium: 9.5 mg/dL (ref 8.6–10.3)
Chloride: 105 mmol/L (ref 98–110)
Creat: 0.81 mg/dL (ref 0.70–1.33)
GFR, Est African American: >89 mL/min (ref >=60)
GFR, Est Non African American: >89 mL/min (ref >=60)
Glucose, Bld: 122 mg/dL — ABNORMAL HIGH (ref 65–99)
Potassium: 4.2 mmol/L (ref 3.5–5.3)
Sodium: 139 mmol/L (ref 135–146)

## 2016-03-30 DIAGNOSIS — R2681 Unsteadiness on feet: Secondary | ICD-10-CM | POA: Diagnosis not present

## 2016-03-30 DIAGNOSIS — I69351 Hemiplegia and hemiparesis following cerebral infarction affecting right dominant side: Secondary | ICD-10-CM | POA: Diagnosis not present

## 2016-04-04 DIAGNOSIS — I69351 Hemiplegia and hemiparesis following cerebral infarction affecting right dominant side: Secondary | ICD-10-CM | POA: Diagnosis not present

## 2016-04-04 DIAGNOSIS — R2681 Unsteadiness on feet: Secondary | ICD-10-CM | POA: Diagnosis not present

## 2016-04-05 ENCOUNTER — Other Ambulatory Visit: Payer: Self-pay | Admitting: Family Medicine

## 2016-04-05 NOTE — Telephone Encounter (Signed)
Needs refills on the following: amlodipine, lipitor, carvedilol, 0000000, folic acid, lipase. Sertraline and thiamine.  [facility] [address]

## 2016-04-06 ENCOUNTER — Ambulatory Visit (INDEPENDENT_AMBULATORY_CARE_PROVIDER_SITE_OTHER): Payer: Medicare Other | Admitting: Family Medicine

## 2016-04-06 ENCOUNTER — Encounter: Payer: Self-pay | Admitting: Family Medicine

## 2016-04-06 VITALS — BP 123/66 | HR 92 | Temp 98.0°F | Ht 68.0 in | Wt 147.0 lb

## 2016-04-06 DIAGNOSIS — Z7189 Other specified counseling: Secondary | ICD-10-CM | POA: Diagnosis not present

## 2016-04-06 DIAGNOSIS — G8929 Other chronic pain: Secondary | ICD-10-CM

## 2016-04-06 DIAGNOSIS — R569 Unspecified convulsions: Secondary | ICD-10-CM | POA: Diagnosis not present

## 2016-04-06 DIAGNOSIS — R3589 Other polyuria: Secondary | ICD-10-CM

## 2016-04-06 DIAGNOSIS — R358 Other polyuria: Secondary | ICD-10-CM | POA: Diagnosis not present

## 2016-04-06 DIAGNOSIS — R739 Hyperglycemia, unspecified: Secondary | ICD-10-CM

## 2016-04-06 LAB — POCT URINALYSIS DIPSTICK
Bilirubin, UA: NEGATIVE
Blood, UA: NEGATIVE
Glucose, UA: NEGATIVE
Ketones, UA: NEGATIVE
Leukocytes, UA: NEGATIVE
Nitrite, UA: NEGATIVE
Protein, UA: NEGATIVE
Spec Grav, UA: 1.02
Urobilinogen, UA: 0.2
pH, UA: 6

## 2016-04-06 LAB — POCT GLYCOSYLATED HEMOGLOBIN (HGB A1C): Hemoglobin A1C: 5

## 2016-04-06 MED ORDER — FOLIC ACID 1 MG PO TABS: 1.0000 mg | ORAL_TABLET | Freq: Every day | ORAL | Status: DC

## 2016-04-06 MED ORDER — LEVETIRACETAM 1000 MG PO TABS: ORAL_TABLET | ORAL | Status: DC

## 2016-04-06 MED ORDER — TAMSULOSIN HCL 0.4 MG PO CAPS: 0.4000 mg | ORAL_CAPSULE | Freq: Every day | ORAL | Status: DC

## 2016-04-06 NOTE — Patient Instructions (Signed)
Sent in refills for you on keppra and folic acid  Checking for diabetes today  For frequent urination: -start flomax 0.4mg  daily Sent this in  Follow up with me in 1 month  Be well, Dr. Ardelia Mems

## 2016-04-06 NOTE — Progress Notes (Signed)
Date of Visit: 04/06/2016   HPI:  Patient presents for follow up.  Urinary issues - still urinating a lot. No dysuria. Just urinates often. No hematuria. He does note difficulty getting an erection as well. Unable to get one at all.  Seizures - needs refill on keppra. No recent seizures. Doing well.  Pain issues - knows I am not going to refill oxycodone for him long term for his generalized body pain. Taking it only at night now before bed. Has half a bottle left.  He does ask if I can give him an rx for medical marijuana. Denies any drug use now. Has not had anything to drink since last December.  ROS: See HPI.  [place]: history of hypertension, hyperlipidemia, cirrhosis, chronic pancreatitis, ETOH, seizures, anoxic brain damage from prior cardiac arrest  PHYSICAL EXAM: BP 123/66 mmHg  Pulse 92  Temp(Src) 98 F (36.7 C) (Oral)  Ht 5\' 8"  (1.727 m)  Wt 147 lb (66.679 kg)  BMI 22.36 kg/m2 Gen: NAD, pleasant, cooperative HEENT: normocephalic, atraumatic, moist mucous membranes  Heart: regular rate and rhythm, no murmur Lungs: clear to auscultation bilaterally, normal work of breathing  Rectal: normal rectal tone. Prostate normal to palpation. No masses, symmetrical & smooth Neuro: at baseline Ext: No appreciable lower extremity edema bilaterally   ASSESSMENT/PLAN:  Encounter for chronic pain management Stable. Continue to wean off oxycodone. Not in his best interest to be on this long term. Advised we do not have medical marijuana here in [place] and that I am not able to prescribe it for him.  Polyuria PSA normal from last visit UA normal today Prostate exam normal Suspect possible BPH Trial of flomax Follow up in 1 month  Hold on therapies for ED at this time until urinary issues are improved.  Seizures (HCC) Stable. Refill keppra.    FOLLOW UP: Follow up in 1 mo for polyuria  [place] J. Ardelia Mems, [place]

## 2016-04-07 MED ORDER — CARVEDILOL 3.125 MG PO TABS: ORAL_TABLET | ORAL | Status: DC

## 2016-04-07 MED ORDER — CYANOCOBALAMIN ER 1000 MCG PO TBCR: 1.0000 | EXTENDED_RELEASE_TABLET | Freq: Every day | ORAL | Status: DC

## 2016-04-07 MED ORDER — VITAMIN B-1 100 MG PO TABS: 100.0000 mg | ORAL_TABLET | Freq: Every day | ORAL | Status: DC

## 2016-04-07 MED ORDER — AMLODIPINE BESYLATE 5 MG PO TABS: ORAL_TABLET | ORAL | Status: DC

## 2016-04-07 MED ORDER — ATORVASTATIN CALCIUM 40 MG PO TABS: 40.0000 mg | ORAL_TABLET | Freq: Every day | ORAL | Status: DC

## 2016-04-07 MED ORDER — SERTRALINE HCL 100 MG PO TABS: 100.0000 mg | ORAL_TABLET | Freq: Every day | ORAL | Status: DC

## 2016-04-07 MED ORDER — PANCRELIPASE (LIP-PROT-AMYL) 12000-38000 UNITS PO CPEP: 12000.0000 [IU] | ORAL_CAPSULE | Freq: Three times a day (TID) | ORAL | Status: DC

## 2016-04-08 NOTE — Assessment & Plan Note (Signed)
Stable. Continue to wean off oxycodone. Not in his best interest to be on this long term. Advised we do not have medical marijuana here in [place] and that I am not able to prescribe it for him.

## 2016-04-08 NOTE — Assessment & Plan Note (Signed)
PSA normal from last visit UA normal today Prostate exam normal Suspect possible BPH Trial of flomax Follow up in 1 month  Hold on therapies for ED at this time until urinary issues are improved.

## 2016-04-08 NOTE — Assessment & Plan Note (Signed)
Stable. Refill keppra.

## 2016-04-13 DIAGNOSIS — R2681 Unsteadiness on feet: Secondary | ICD-10-CM | POA: Diagnosis not present

## 2016-04-13 DIAGNOSIS — I69351 Hemiplegia and hemiparesis following cerebral infarction affecting right dominant side: Secondary | ICD-10-CM | POA: Diagnosis not present

## 2016-04-26 ENCOUNTER — Other Ambulatory Visit: Payer: Self-pay | Admitting: *Deleted

## 2016-04-26 NOTE — Telephone Encounter (Signed)
Pt states that he is going to check with his ride and see when he can come in for an appointment.  He will need the referral before then. Akshar Starnes, [name], CMA

## 2016-04-27 NOTE — Telephone Encounter (Signed)
I'm not sure what referral the patient means.  It looks like Dr. Ardelia Mems wants for the patient to be seen before she prescribed him any refills.  He should make an appt to see a clinician for this.

## 2016-04-28 NOTE — Telephone Encounter (Signed)
I apologize that is to say refills not referrals.  He was going to call back about an appointment. Melvia Matousek, [name], CMA

## 2016-05-17 ENCOUNTER — Ambulatory Visit (INDEPENDENT_AMBULATORY_CARE_PROVIDER_SITE_OTHER): Payer: Medicare Other | Admitting: Family Medicine

## 2016-05-17 ENCOUNTER — Encounter: Payer: Self-pay | Admitting: Family Medicine

## 2016-05-17 VITALS — BP 132/75 | HR 74 | Temp 97.8°F | Wt 158.6 lb

## 2016-05-17 DIAGNOSIS — K861 Other chronic pancreatitis: Secondary | ICD-10-CM | POA: Diagnosis not present

## 2016-05-17 DIAGNOSIS — R3589 Other polyuria: Secondary | ICD-10-CM

## 2016-05-17 DIAGNOSIS — R358 Other polyuria: Secondary | ICD-10-CM | POA: Diagnosis not present

## 2016-05-17 MED ORDER — TAMSULOSIN HCL 0.4 MG PO CAPS: 0.8000 mg | ORAL_CAPSULE | Freq: Every day | ORAL | 1 refills | Status: DC

## 2016-05-17 NOTE — Progress Notes (Signed)
Date of Visit: 05/17/2016   HPI:  Follow up polyuria: taking flomax 0.4mg  daily. Has been out for 4 or so days. It increased his urine flow, but did not help with urinary frequency at night. Getting up 2-3 times per night to urinate. Does admit to weak stream when off the medication.   GI follow up  - has not seen [facility] since getting out of SNF.  Would like flu shot  ROS: See HPI.  [place]: history of seizures, chronic pancreatitis, ETOH cirrhosis, CVA, hypertension, hyperlipidemia, anoxic brain damage  PHYSICAL EXAM: BP 132/75   Pulse 74   Temp 97.8 F (36.6 C) (Oral)   Wt 158 lb 9.6 oz (71.9 kg)   BMI 24.12 kg/m  Gen: NAD, pleasant, cooperative, well appearing HEENT: normocephalic, atraumatic, moist mucous membranes  Abdomen: soft nontender to palpation, no masses or organomegaly Neuro: alert, at baseline with contractures, speech normal Ext: No appreciable lower extremity edema bilaterally   ASSESSMENT/PLAN:  Health maintenance:  -flu shot at next visit as we do not yet have supply in  Polyuria Suspect needs higher dose of flomax Resume 0.4mg  daily for 2 weeks, then go up to 0.8 mg daily Follow up with me in 4-6 weeks  Chronic pancreatitis Needs follow up with GI given how sick he was when hospitalized at [facility] Again gave him phone # so he can schedule follow up appointment. He is agreeable.  FOLLOW UP: Follow up in 4-6 weeks for polyuria Schedule follow up with GI  [place] J. Ardelia Mems, [place]

## 2016-05-17 NOTE — Patient Instructions (Signed)
Start back with 0.4mg  (one pill) of the flomax After 2 weeks, go up to 2 pills per day  Call the [place] office to schedule a follow up appointment: [phone]  [facility] do your flu shot next time - we don't have them in yet.  See me in 1 month.   Be well, Dr. Ardelia Mems

## 2016-05-18 NOTE — Assessment & Plan Note (Signed)
Needs follow up with GI given how sick he was when hospitalized at [facility] Again gave him phone # so he can schedule follow up appointment. He is agreeable.

## 2016-05-18 NOTE — Assessment & Plan Note (Signed)
Suspect needs higher dose of flomax Resume 0.4mg  daily for 2 weeks, then go up to 0.8 mg daily Follow up with me in 4-6 weeks

## 2016-05-23 ENCOUNTER — Other Ambulatory Visit: Payer: Self-pay | Admitting: Family Medicine

## 2016-05-25 NOTE — Telephone Encounter (Signed)
2nd request.  Aideliz Garmany L, RN  

## 2016-06-14 ENCOUNTER — Ambulatory Visit: Payer: Medicare Other | Admitting: Family Medicine

## 2016-06-29 ENCOUNTER — Other Ambulatory Visit: Payer: Self-pay | Admitting: Family Medicine

## 2016-08-05 ENCOUNTER — Other Ambulatory Visit: Payer: Self-pay | Admitting: Family Medicine

## 2016-08-10 DIAGNOSIS — Q453 Other congenital malformations of pancreas and pancreatic duct: Secondary | ICD-10-CM | POA: Diagnosis not present

## 2016-08-10 DIAGNOSIS — K860 Alcohol-induced chronic pancreatitis: Secondary | ICD-10-CM | POA: Diagnosis not present

## 2016-08-20 ENCOUNTER — Other Ambulatory Visit: Payer: Self-pay | Admitting: Family Medicine

## 2016-08-24 NOTE — Telephone Encounter (Signed)
I will send in a refill but patient needs to follow up with me in the office. Red team, please call him & ask him to schedule an appointment Thanks Leeanne Rio, MD

## 2016-08-24 NOTE — Telephone Encounter (Signed)
2nd request.  Alexcis Bicking L, RN  

## 2016-08-25 NOTE — Telephone Encounter (Signed)
Pt informed and scheduled for an appt. Nadege Carriger, CMA  

## 2016-09-05 ENCOUNTER — Ambulatory Visit: Payer: Medicare Other | Admitting: Family Medicine

## 2016-09-12 ENCOUNTER — Ambulatory Visit: Payer: Medicare Other | Admitting: Family Medicine

## 2016-09-19 ENCOUNTER — Other Ambulatory Visit: Payer: Self-pay | Admitting: Family Medicine

## 2016-09-25 ENCOUNTER — Other Ambulatory Visit: Payer: Self-pay | Admitting: Family Medicine

## 2016-09-28 ENCOUNTER — Other Ambulatory Visit: Payer: Self-pay | Admitting: Family Medicine

## 2016-11-24 ENCOUNTER — Other Ambulatory Visit: Payer: Self-pay | Admitting: Family Medicine

## 2016-11-29 ENCOUNTER — Other Ambulatory Visit: Payer: Self-pay | Admitting: Family Medicine

## 2016-12-16 ENCOUNTER — Other Ambulatory Visit: Payer: Self-pay | Admitting: Family Medicine

## 2017-01-24 ENCOUNTER — Other Ambulatory Visit: Payer: Self-pay | Admitting: Family Medicine

## 2017-01-25 ENCOUNTER — Other Ambulatory Visit: Payer: Self-pay | Admitting: Family Medicine

## 2017-02-21 ENCOUNTER — Other Ambulatory Visit: Payer: Self-pay | Admitting: *Deleted

## 2017-02-22 MED ORDER — CARVEDILOL 3.125 MG PO TABS: ORAL_TABLET | ORAL | 0 refills | Status: DC

## 2017-02-22 MED ORDER — ATORVASTATIN CALCIUM 40 MG PO TABS: 40.0000 mg | ORAL_TABLET | Freq: Every day | ORAL | 0 refills | Status: DC

## 2017-02-22 MED ORDER — AMLODIPINE BESYLATE 5 MG PO TABS: 5.0000 mg | ORAL_TABLET | Freq: Every day | ORAL | 0 refills | Status: DC

## 2017-02-22 MED ORDER — LEVETIRACETAM 1000 MG PO TABS: 1000.0000 mg | ORAL_TABLET | Freq: Two times a day (BID) | ORAL | 0 refills | Status: DC

## 2017-02-22 MED ORDER — SERTRALINE HCL 100 MG PO TABS: 100.0000 mg | ORAL_TABLET | Freq: Every day | ORAL | 0 refills | Status: DC

## 2017-02-22 NOTE — Telephone Encounter (Signed)
I will send in a final months worth of medications But patient needs to schedule follow up visit with me or any other provider here in the clinic We have not seen him in almost 10 months. Thanks Leeanne Rio, MD

## 2017-02-22 NOTE — Telephone Encounter (Signed)
2nd request.  Chioke Noxon L, RN  

## 2017-02-24 ENCOUNTER — Other Ambulatory Visit: Payer: Self-pay | Admitting: Family Medicine

## 2017-02-27 NOTE — Telephone Encounter (Signed)
Attempted to call pt, no answer. Will try again later. Deseree Blount, CMA  

## 2017-03-02 ENCOUNTER — Other Ambulatory Visit: Payer: Self-pay | Admitting: *Deleted

## 2017-03-02 NOTE — Telephone Encounter (Signed)
Called patient x 2, no answer, no voicemail.

## 2017-03-02 NOTE — Telephone Encounter (Signed)
Will not refill for 90 days as patient needs to schedule an appointment Steven Rio, MD

## 2017-03-02 NOTE — Telephone Encounter (Signed)
Refill request for 90 day supply.  Martin, Tamika L, RN  

## 2017-03-02 NOTE — Telephone Encounter (Signed)
We have attempted to call patient several times (see previous phone note). Will send letter.

## 2017-03-27 ENCOUNTER — Other Ambulatory Visit: Payer: Self-pay | Admitting: Family Medicine

## 2017-03-30 ENCOUNTER — Other Ambulatory Visit: Payer: Self-pay | Admitting: Family Medicine

## 2017-04-14 ENCOUNTER — Ambulatory Visit: Payer: Medicare Other | Admitting: Family Medicine

## 2017-04-22 ENCOUNTER — Other Ambulatory Visit: Payer: Self-pay | Admitting: Family Medicine

## 2017-04-28 ENCOUNTER — Other Ambulatory Visit: Payer: Self-pay | Admitting: Family Medicine

## 2017-04-29 ENCOUNTER — Other Ambulatory Visit: Payer: Self-pay | Admitting: Family Medicine

## 2017-05-04 ENCOUNTER — Other Ambulatory Visit: Payer: Self-pay | Admitting: *Deleted

## 2017-05-04 MED ORDER — LEVETIRACETAM 1000 MG PO TABS: ORAL_TABLET | ORAL | 0 refills | Status: DC

## 2017-05-04 NOTE — Telephone Encounter (Signed)
Pharmacy requesting 90 day supply

## 2017-05-05 ENCOUNTER — Other Ambulatory Visit: Payer: Self-pay | Admitting: Family Medicine

## 2017-05-08 ENCOUNTER — Other Ambulatory Visit: Payer: Self-pay | Admitting: *Deleted

## 2017-05-10 ENCOUNTER — Other Ambulatory Visit: Payer: Self-pay | Admitting: Family Medicine

## 2017-06-02 ENCOUNTER — Encounter: Payer: Self-pay | Admitting: Family Medicine

## 2017-06-02 ENCOUNTER — Encounter: Payer: Self-pay | Admitting: Psychology

## 2017-06-02 ENCOUNTER — Ambulatory Visit (HOSPITAL_COMMUNITY)
Admission: RE | Admit: 2017-06-02 | Discharge: 2017-06-02 | Disposition: A | Discharge status: Home / Self Care | Payer: Medicare Other | Source: Ambulatory Visit | Attending: Family Medicine | Admitting: Family Medicine

## 2017-06-02 ENCOUNTER — Ambulatory Visit (INDEPENDENT_AMBULATORY_CARE_PROVIDER_SITE_OTHER): Payer: Medicare Other | Admitting: Family Medicine

## 2017-06-02 VITALS — BP 130/72 | HR 77 | Temp 98.2°F | Ht 68.0 in | Wt 133.4 lb

## 2017-06-02 DIAGNOSIS — R001 Bradycardia, unspecified: Secondary | ICD-10-CM | POA: Diagnosis not present

## 2017-06-02 DIAGNOSIS — R569 Unspecified convulsions: Secondary | ICD-10-CM | POA: Diagnosis not present

## 2017-06-02 DIAGNOSIS — F320 Major depressive disorder, single episode, mild: Secondary | ICD-10-CM | POA: Diagnosis not present

## 2017-06-02 DIAGNOSIS — I10 Essential (primary) hypertension: Secondary | ICD-10-CM

## 2017-06-02 DIAGNOSIS — E785 Hyperlipidemia, unspecified: Secondary | ICD-10-CM | POA: Diagnosis not present

## 2017-06-02 DIAGNOSIS — R079 Chest pain, unspecified: Secondary | ICD-10-CM | POA: Diagnosis not present

## 2017-06-02 DIAGNOSIS — R3589 Other polyuria: Secondary | ICD-10-CM

## 2017-06-02 DIAGNOSIS — R358 Other polyuria: Secondary | ICD-10-CM | POA: Diagnosis not present

## 2017-06-02 MED ORDER — SERTRALINE HCL 50 MG PO TABS: 150.0000 mg | ORAL_TABLET | Freq: Every day | ORAL | 1 refills | Status: DC

## 2017-06-02 NOTE — Assessment & Plan Note (Signed)
Taking atorvastatin. Unfortunately he is not fasting today. He will schedule fasting lab visit.

## 2017-06-02 NOTE — Assessment & Plan Note (Signed)
Has both typical and atypical features. EKG today without any acute changes. Given his history of cardiac arrest and known risk factors for CAD, I will refer him to cardiology for further evaluation. Patient agreeable to this plan. Discussed chest pain return precautions including when to go to the emergency room.

## 2017-06-02 NOTE — Assessment & Plan Note (Signed)
Uncontrolled. Increase Zoloft to 150 mg daily. [facility] consult today. See separate note. Follow-up in one month, if he is not able to get in with me he should see any physician here to see how he is doing on the increased dose of Zoloft.

## 2017-06-02 NOTE — Assessment & Plan Note (Signed)
Stable on Keppra. Continue this medication.

## 2017-06-02 NOTE — Patient Instructions (Addendum)
Increase zoloft to 150mg  daily (take three 50mg  pills) - sent these in  Follow up in 1 month to see how mood is doing. Please see any physician here if I do not have any openings.  Sending you to heart doctor to be evaluated for the chest pain you have had. If you have any chest pain that does not go away within 30 minutes, is accompanied by nausea, sweating, shortness of breath, or made worse by activity, go to the emergency room immediately for evaluation.   Referring to urology for your urinary symptoms and erectile problems  Someone will call you about these appts.   Schedule an annual wellness visit with our nurse Lauren. This is a longer visit to focus on your wellness goals and how to keep you healthy. It is free.   Be well, Dr. Ardelia Mems

## 2017-06-02 NOTE — Progress Notes (Signed)
Dr. Ardelia Mems requested a [facility] consult for patient.  Presenting Issue: Patient is struggling with limited mobility due to stroke which have restricted his ability to work and drive and has contributed to feeling down.  Report of symptoms: Patient reported low mood, feeling bad about about himself, loss of interest in previously enjoyed activites as well as social withdrawal.  Duration of CURRENT symptoms: Symptoms have been ongoing for past year since he had a stroke. He reported that as he has gained more mental clarity he has felt more down as he comes to terms with his physical limitations.  Age of onset of first mood disturbance: He reported struggling with low mood his entire life.   Impact on function: patient reported his   Psychiatric History - Diagnoses:No - Hospitalizations:  No - Pharmacotherapy: Zoloft 50mg  - Outpatient therapy: None  Family history of psychiatric issues: Not aware of any family psychiatric dx or issues.   Current and history of substance use: Drinks 1 to 2 beers every 2-3 days. Smokes avg of 4-5 cigarettes per day.    PHQ-9: (9), denied item 9 (SI)  Assessment/Plan Recommendations:  Patient's affect was normal and appropriate throughout the appointment and he joked with [facility] on several occasions. His PHQ 9 score (9) indicates mild depressive symptoms, which patient attributes to his limited mobility which restrict his ability to drive and work. Patient reported him symptoms have been exacerbated in the past year. He has limited social support and reported withdrawing socially. Patient spends a lot of time at home watching tv and [facility] discussed with patient the importance of engaging in social interactions. Patient was somewhat reluctant to increase social interactions and reported lack of friends and busy family schedules which make it difficult to spend time with them. Patient lives with father and reported he sometimes talks with him when he is feeling down and  agreed to continue interacting with his dad when he feels down.  He is scheduled with [facility] in 2 weeks for a follow-up appointment.   Warm hand-off complete.

## 2017-06-02 NOTE — Assessment & Plan Note (Signed)
Continues despite Flomax. With him also having erectile dysfunction, I want him to get in with urology to be seen for these issues. We are already managing a lot of medical issues here and I think it would be helpful to get a specialist involved for his urological issues.

## 2017-06-02 NOTE — Assessment & Plan Note (Signed)
Well-controlled, continue current regimen 

## 2017-06-02 NOTE — Progress Notes (Signed)
Date of Visit: 06/02/2017   HPI:  Patient presents for follow up. I have not seen him in the office in >1 year.  Seizures: Taking Keppra 1000 mg twice daily. No seizures at all since I saw him last. Tolerating Keppra well.  Urinary frequency: Taking Flomax 0.8 mg daily. Still having frequent nocturia. Also endorsing erectile dysfunction.  Hypertension: Taking carvedilol 3.125 mg twice daily, amlodipine 5 mg daily. Tolerating these well. Does endorse on review of systems having some chest pain with exertion. Lasts about 2-3 minutes when walking. When he continues to walk the pain gradually subsides. It is associated with mild shortness of breath. Denies any sweating or nausea. He has been increasing his walking more recently. He does smoke cigarettes and is interested in quitting smoking in the next 6 months. Previously saw a cardiologist in 2014 and had a stress test which was normal. Has not had any cardiology follow-up since then. He has a history of cardiac arrest in the setting of severe illness years back.  Depression: Taking Zoloft 100 mg daily. Believes this medication helps, but does not fully relieve his symptoms. Feels sad and down that he is not able to do everything he wishes he could do. Lives with his father, who he says is more mobile than he is. He is interested in increasing the medication. Also interested in meeting with a counselor. Agreeable to having an integrated behavioral health consultation today. No SI/HI.  Hyperlipidemia: Taking atorvastatin 40 mg daily. Tolerating this well. He is not fasting today.  ROS: See HPI.  [place]: history of hyperlipidemia, hypertension, seizures, major depression, polyuria, chronic pancreatitis, alcoholic cirrhosis, prior CVA with resultant contractures & anoxic brain injury  PHYSICAL EXAM: BP 130/72   Pulse 77   Temp 98.2 F (36.8 C) (Oral)   Ht 5\' 8"  (1.727 m)   Wt 133 lb 6.4 oz (60.5 kg)   SpO2 99%   BMI 20.28 kg/m  Gen: no acute  distress, pleasant, cooperative HEENT: normocephalic, atraumatic, mmm Heart:  Regular rate and rhythm, no murmur Lungs: clear to auscultation bilaterally normal work of breathing  Neuro: alert grossly nonfocal speech normal Ext: R hand chronically contracted Psych: normal range of affect, well groomed, speech normal in rate and volume, normal eye contact   ASSESSMENT/PLAN:  Health maintenance:  -encouraged to schedule Annual Wellness Visit  Major depression, single episode Uncontrolled. Increase Zoloft to 150 mg daily. [facility] consult today. See separate note. Follow-up in one month, if he is not able to get in with me he should see any physician here to see how he is doing on the increased dose of Zoloft.  Seizures ([place]) Stable on Keppra. Continue this medication.  Hyperlipidemia Taking atorvastatin. Unfortunately he is not fasting today. He will schedule fasting lab visit.  Polyuria Continues despite Flomax. With him also having erectile dysfunction, I want him to get in with urology to be seen for these issues. We are already managing a lot of medical issues here and I think it would be helpful to get a specialist involved for his urological issues.  Chest pain Has both typical and atypical features. EKG today without any acute changes. Given his history of cardiac arrest and known risk factors for CAD, I will refer him to cardiology for further evaluation. Patient agreeable to this plan. Discussed chest pain return precautions including when to go to the emergency room.  FOLLOW UP: Follow up in 1 mo for depression Referring to urology Referring to cardiology  [place]  J. Ardelia Mems, [place]

## 2017-06-05 ENCOUNTER — Other Ambulatory Visit: Payer: Self-pay | Admitting: Family Medicine

## 2017-06-13 ENCOUNTER — Other Ambulatory Visit: Payer: Self-pay | Admitting: Family Medicine

## 2017-06-16 ENCOUNTER — Ambulatory Visit: Payer: Medicare Other

## 2017-06-27 ENCOUNTER — Other Ambulatory Visit: Payer: Self-pay | Admitting: Family Medicine

## 2017-07-14 ENCOUNTER — Encounter: Payer: Self-pay | Admitting: Family Medicine

## 2017-07-14 ENCOUNTER — Ambulatory Visit (INDEPENDENT_AMBULATORY_CARE_PROVIDER_SITE_OTHER): Payer: Medicare Other | Admitting: Family Medicine

## 2017-07-14 VITALS — BP 130/72 | HR 69 | Temp 97.8°F | Ht 68.0 in | Wt 130.8 lb

## 2017-07-14 DIAGNOSIS — Z23 Encounter for immunization: Secondary | ICD-10-CM | POA: Diagnosis not present

## 2017-07-14 DIAGNOSIS — R358 Other polyuria: Secondary | ICD-10-CM

## 2017-07-14 DIAGNOSIS — R3589 Other polyuria: Secondary | ICD-10-CM

## 2017-07-14 DIAGNOSIS — E785 Hyperlipidemia, unspecified: Secondary | ICD-10-CM

## 2017-07-14 DIAGNOSIS — F320 Major depressive disorder, single episode, mild: Secondary | ICD-10-CM

## 2017-07-14 NOTE — Assessment & Plan Note (Signed)
Symptoms better controlled on increased dose of Zoloft. Continue this dose. Follow-up with me in 3 months.

## 2017-07-14 NOTE — Assessment & Plan Note (Signed)
I will message Tia, our referral specialist, to follow-up on the status of urology referral.

## 2017-07-14 NOTE — Patient Instructions (Signed)
I'm glad you're doing better. Schedule fasting lab visit one day when you haven't had anything to eat or drink  Will check on urology referral  Keep appointment with cardiologist  See me in about 3 months  Be well, Dr. Ardelia Mems

## 2017-07-14 NOTE — Progress Notes (Signed)
Date of Visit: 07/14/2017   HPI:  Patient presents for follow-up. He is accompanied by his daughter.  Depression: At last visit we increased his Zoloft to 150 mg daily. He has been taking this and reports he is doing well now. Mood is much better controlled. Denies thoughts of harming himself or others. He is happy with this dose and wants to continue on it.  Urology referral: Has not yet heard about the referral to the urologist.  Cardiology referral: Has an appointment in a couple of weeks with cardiology. Chest discomfort is the same.Lasts just for a few minutes at a time and self resolves. He understands what symptoms should prompt him to seek emergency care for chest pain.  ROS: See HPI.  [place]: history of depression, polyuria, hyperlipidemia, hypertension, seizure disorder, chronic pancreatitis, ETOH cirrhosis, prior CVA  PHYSICAL EXAM: BP 130/72   Pulse 69   Temp 97.8 F (36.6 C) (Oral)   Ht 5\' 8"  (1.727 m)   Wt 130 lb 12.8 oz (59.3 kg)   SpO2 99%   BMI 19.89 kg/m  Gen: no acute distress, pleasant, cooperative HEENT: normocephalic, atraumatic, moist mucous membranes  Heart: regular rate and rhythm, no murmur Lungs: clear to auscultation bilaterally normal work of breathing  Neuro: alert, grossly nonfocal, at baseline with contracted R hand Ext: No appreciable lower extremity edema bilaterally  Psych: normal range of affect, well groomed, speech normal in rate and volume, normal eye contact   ASSESSMENT/PLAN:  Health maintenance:  -flu shot given today  Polyuria I will message Tia, our referral specialist, to follow-up on the status of urology referral.  Major depression, single episode Symptoms better controlled on increased dose of Zoloft. Continue this dose. Follow-up with me in 3 months.  Hyperlipidemia Recommended patient he is due for fasting labs. He was fasting today but preferred not to have labs drawn. He will schedule lab visit.  Cardiology  referral Encouraged him to keep the appointment is scheduled with cardiology in a few weeks. He was counseled again on reasons to seek emergency care.  FOLLOW UP: Follow up in 3 mos for routine issues Check on status of urology referral  [place] J. Ardelia Mems, [place]

## 2017-07-14 NOTE — Assessment & Plan Note (Signed)
Recommended patient he is due for fasting labs. He was fasting today but preferred not to have labs drawn. He will schedule lab visit.

## 2017-07-17 ENCOUNTER — Telehealth: Payer: Self-pay | Admitting: Family Medicine

## 2017-07-17 NOTE — Telephone Encounter (Signed)
Attempted to call pt no answer. Will try again later. Deseree Blount, CMA  

## 2017-07-17 NOTE — Telephone Encounter (Signed)
-----   Message from Carolan Clines sent at 07/17/2017  8:39 AM EDT ----- Patient is already scheduled to see Dr. Gloriann Loan on 10/25 at 10:45AM. Per Referral Coordinator at [place], patient was made aware of appointment when it was made on 07/05/17. He is scheduled at [facility].  Orson Eva    ----- Message ----- From: Leeanne Rio, MD Sent: 07/14/2017   5:30 PM To: Tia C Hill  Can you check on the status of his urology referral? Thanks! [place]

## 2017-07-17 NOTE — Telephone Encounter (Signed)
Red team, can you call patient & make sure he knows about this urology appointment? (see Tia's message below).  Thanks! Leeanne Rio, MD

## 2017-07-27 ENCOUNTER — Ambulatory Visit: Payer: Medicare Other | Admitting: Cardiovascular Disease

## 2017-08-03 ENCOUNTER — Other Ambulatory Visit: Payer: Self-pay | Admitting: Family Medicine

## 2017-08-03 MED ORDER — SERTRALINE HCL 50 MG PO TABS: 150.0000 mg | ORAL_TABLET | Freq: Every day | ORAL | 1 refills | Status: DC

## 2017-08-07 ENCOUNTER — Other Ambulatory Visit: Payer: Self-pay | Admitting: Family Medicine

## 2017-08-08 ENCOUNTER — Ambulatory Visit (INDEPENDENT_AMBULATORY_CARE_PROVIDER_SITE_OTHER): Payer: Medicare Other | Admitting: Physician Assistant

## 2017-08-08 ENCOUNTER — Encounter: Payer: Self-pay | Admitting: Physician Assistant

## 2017-08-08 VITALS — BP 111/75 | HR 80 | Ht 68.0 in | Wt 131.0 lb

## 2017-08-08 DIAGNOSIS — R072 Precordial pain: Secondary | ICD-10-CM | POA: Diagnosis not present

## 2017-08-08 DIAGNOSIS — R079 Chest pain, unspecified: Secondary | ICD-10-CM | POA: Diagnosis not present

## 2017-08-08 DIAGNOSIS — E785 Hyperlipidemia, unspecified: Secondary | ICD-10-CM

## 2017-08-08 DIAGNOSIS — I10 Essential (primary) hypertension: Secondary | ICD-10-CM

## 2017-08-08 DIAGNOSIS — Z9889 Other specified postprocedural states: Secondary | ICD-10-CM | POA: Diagnosis not present

## 2017-08-08 DIAGNOSIS — F1010 Alcohol abuse, uncomplicated: Secondary | ICD-10-CM

## 2017-08-08 LAB — BASIC METABOLIC PANEL
BUN/Creatinine Ratio: 15 (ref 10–24)
BUN: 14 mg/dL (ref 8–27)
CO2: 19 mmol/L — ABNORMAL LOW (ref 20–29)
Calcium: 9.8 mg/dL (ref 8.6–10.2)
Chloride: 103 mmol/L (ref 96–106)
Creatinine, Ser: 0.92 mg/dL (ref 0.76–1.27)
GFR calc Af Amer: 104 mL/min/{1.73_m2} (ref >59)
GFR calc non Af Amer: 90 mL/min/{1.73_m2} (ref >59)
Glucose: 365 mg/dL — ABNORMAL HIGH (ref 65–99)
Potassium: 4.5 mmol/L (ref 3.5–5.2)
Sodium: 140 mmol/L (ref 134–144)

## 2017-08-08 MED ORDER — METOPROLOL TARTRATE 50 MG PO TABS: ORAL_TABLET | ORAL | 0 refills | Status: DC

## 2017-08-08 NOTE — Patient Instructions (Addendum)
Medication Instructions:  Your physician recommends that you continue on your current medications as directed. Please refer to the Current Medication list given to you today.  Labwork: Your physician recommends that you return for lab work in: BMP by the end of the week fasting   Testing/Procedures: Please arrive at the [facility] main entrance of [facility] at ____:_____ AM/PM (30-45 minutes prior to test start time)  [facility]  [place], [place] [address]  [phone]  Proceed to the [facility] Radiology Department (First Floor).  Please follow these instructions carefully (unless otherwise directed):  Hold all erectile dysfunction medications at least 48 hours prior to test.  On the Night Before the Test:  * Drink plenty of water.  * Do not consume any caffeinated/decaffeinated beverages or chocolate 12 hours prior to your test.  * Do not take any antihistamines 12 hours prior to your test.  On the Day of the Test:  * Drink plenty of water. Do not drink any water within one hour of the test.  * Do not eat any food 4 hours prior to the test.  * You may take your regular medications prior to the test.  * IF NOT ON A BETA BLOCKER - Take 50 mg of lopressor (metoprolol) one hour before the test.  After the Test:  * Drink plenty of water.  * After receiving IV contrast, you may experience a mild flushed feeling. This is normal.  * On occasion, you may experience a mild rash up to 24 hours after the test. This is not dangerous. If this occurs, you can take Benadryl 25 mg and increase your fluid intake.  * If you experience trouble breathing, this can be serious. If it is severe call 911 IMMEDIATELY. If it is mild, please call our office.   Follow-Up: Your physician wants you to follow-up in: 6 months with Dr Rayann Heman. You will receive a reminder letter in the mail two months in advance. If you don't receive a letter, please call our office to  schedule the follow-up appointment.  IF TEST COMES BACK POSITIVE YOUR APPOINTMENT WILL BE SCHEDULED SOONER  Any Other Special Instructions Will Be Listed Below (If Applicable).  If you need a refill on your cardiac medications before your next appointment, please call your pharmacy.

## 2017-08-08 NOTE — Progress Notes (Signed)
Cardiology Office Note    Date:  08/10/2017   ID:  Steven Frey, DOB [birth year], MRN [id number]  PCP:  Leeanne Rio, MD  Cardiologist:  Dr. Rayann Heman   Chief Complaint  Patient presents with  . Follow-up    seen for Dr. Rayann Heman    History of Present Illness:  Steven Frey is a 61 y.o. male with PMH of chronic pancreatitis 2/2 alcohol abuse, EtOH cirrhosis, COPD, HTN, HLD, h/o seizure, CVA and VF arrest. He underwent pancreatic duct stenting at [facility]. He had an out-of-hospital VF arrest in June 2011 in the setting of heavy EtOH use and electrolyte depletion. He was resuscitated and placed on therapeutic hypothermia. He had QT prolongation during cooling that eventually resolved. Hospital course was prolonged with anoxic brain injury. Cardiac catheterization performed in June 2011 showed normal coronaries, minimal inferobasal wall hypokinesis, EF 55%. Echocardiogram obtained in July 2011 showed EF 50-55%, grade 2 DD, PASP 26 mmHg. He previously wore a lifevest but refused to keep wearing it. He was felt to be a poor ICD candidate, and the patient refused to pursue this as well. He had a negative Myoview in June 2014. Last echocardiogram obtained on 12/31/2014 showed EF 55%, grade 1 DD. In February 2017, he was admitted for severe pancreatitis and on CT scan found to have an incidental thrombus in the left iliac artery. He was seen by Dr. Oneida Alar and was treated with heparin drip. His hemoglobin dropped from 9.5 down to a 6.8 and required blood transfusion of 2 units. He also required thoracentesis as well during the procedure. Pleural fluid culture was negative for growth.  Patient presents today for evaluation of chest pain which has been going on for the past 2 months. He says it occurs both at rest and with exertion. He does have a chest wall pain however he says this is a different type of chest pain. The symptom does not seem to worsen with exertion. The frequency has  not changed in the past 2 months although the intensity has. I have reviewed his previous cardiac catheterization in 2011, showed normal coronaries at that time. Otherwise he denies associated symptoms. I recommended a coronary CT for further evaluation. He is due to have fasting lipid panel and LFT, this has already been ordered by his primary care provider, he has not been able to get it for now. I urged him to have the lab work.   Past Medical History:  Diagnosis Date  . 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 Anemia   . Angioedema    rx requiring intubation/vent support suspected secondary to ACE1 (but also on zithromax) 5/09 hospitalization   . Anoxic brain damage (HCC)   . Arthritis   . Chronic pain 2011  . Chronic pancreatitis ([place])    (10/08 hops). admx 02/2010 pseudocyst aspirated during admxn. pancreatic dual stent placement at [facility] 11/11  . Cirrhosis ([place]) 2011   due to ETOH with recent hepatic abcess and possible HCC.   Marland Kitchen COPD (chronic obstructive pulmonary disease) ([place])    DOE  . Depression with anxiety    no hx of meds  . Diabetes mellitus without complication ([place])    Type 2, pt reports no longer takes meds & A1C was normal.  . Edema   . ETOH abuse   . Gastritis    alcohol induced  . GERD (gastroesophageal reflux disease)   . History of stroke 2011   reports it was caused by pain from  pancreatitis- caused heart to stop and stroke   . Hyperlipidemia 2011  . Hypertension 2011  . Inguinal hernia 2011  . Myocardial infarction ([place])   . Pneumonia    hx of  . Seizures ([place]) 2011   had seizure 11-24-14/went to ED per pt.   . Smoker   . Stroke [facility])    right sided weakness; affected long term memory  . 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 VF (ventricular fibrillation) ([place]) 2011   arrest 6/11. successfully rescucitated w/prolinged hospitalization due to respitatory failure. QT prolongation during cooling, now resolved    Past Surgical  History:  Procedure Laterality Date  . [place]   right  . INGUINAL HERNIA REPAIR Left 07/12/2013   Procedure: HERNIA REPAIR INGUINAL ADULT;  Surgeon: Adin Hector, MD;  Location: [place];  Service: General;  Laterality: Left;  . INSERTION OF MESH Left 07/12/2013   Procedure: INSERTION OF MESH;  Surgeon: Adin Hector, MD;  Location: [place];  Service: General;  Laterality: Left;  . pancreatic stent placement    . right wrist ORIF  1976   for fx    Current Medications: Outpatient Medications Prior to Visit  Medication Sig Dispense Refill  . acetaminophen (TYLENOL) 500 MG tablet Take 500 mg by mouth 2 (two) times daily as needed.    Marland Kitchen CREON 12000 units CPEP capsule TAKE 1 CAPSULE BY MOUTH 3 TIMES A DAY BEFORE MEALS 90 capsule 5  . [facility] B-1 100 MG tablet TAKE 1 TABLET (100 MG TOTAL) BY MOUTH DAILY. 90 tablet 0  . [facility] B-12 1000 MCG TBCR TAKE 1 TABLET (1,000 MCG TOTAL) BY MOUTH DAILY. 90 tablet 0  . folic acid (FOLVITE) 1 MG tablet TAKE 1 TABLET BY MOUTH EVERY DAY 90 tablet 0  . levETIRAcetam (KEPPRA) 1000 MG tablet TAKE 1 TABLET BY MOUTH 2 TIMES DAILY. 180 tablet 1  . sertraline (ZOLOFT) 50 MG tablet Take 3 tablets (150 mg total) by mouth daily. 90 tablet 1  . tamsulosin (FLOMAX) 0.4 MG CAPS capsule TAKE 2 CAPSULES (0.8 MG TOTAL) BY MOUTH DAILY. 60 capsule 0  . amLODipine (NORVASC) 5 MG tablet TAKE 1 TABLET BY MOUTH EVERY DAY 30 tablet 1  . atorvastatin (LIPITOR) 40 MG tablet TAKE 1 TABLET BY MOUTH EVERY DAY 30 tablet 1  . carvedilol (COREG) 3.125 MG tablet TAKE 1 TABLET BY MOUTH TWICE A DAY WITH A MEAL 60 tablet 1   No facility-administered medications prior to visit.      Allergies:   Azithromycin and Lisinopril   Social History   Social History  . Marital status: Divorced    Spouse name: N/A  . Number of children: N/A  . Years of education: N/A   Social History Main Topics  . Smoking status: Current Every Day Smoker    Packs/day: 0.25    Types:  Cigarettes  . Smokeless tobacco: Never Used  . Alcohol use 0.0 oz/week     Comment: hx of alcohol abuse, every 2-3 weeks beer on and off   . Drug use: No     Comment: h/o THC, cocaine, heroin; no longer using since 2011  . Sexual activity: No   Other Topics Concern  . None   Social History Narrative   Divorced, 2 children. 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 Rayann Heman is his cardiologist, last seen 03/2012; sees 1x/year    Dr. Krista Blue, [facility], is neurologist for seizures       Single, unemployed    Lives with his 20yo father   Current smoker since 1966     Family History:  The patient's family history includes Alcohol abuse in his father, mother, and unknown relative; Breast cancer in his paternal grandmother; Diabetes in his sister; Early death in his mother; Hyperlipidemia in his sister; Hypertension in his sister; Prostate cancer (age of onset: 61) in his father.   ROS:   Please see the history of present illness.    ROS All other systems reviewed and are negative.   PHYSICAL EXAM:   VS:  BP 111/75   Pulse 80   Ht 5\' 8"  (1.727 m)   Wt 131 lb (59.4 kg)   BMI 19.92 kg/m    GEN: Well nourished, well developed, in no acute distress  HEENT: normal  Neck: no JVD, carotid bruits, or masses Cardiac: RRR; no murmurs, rubs, or gallops,no edema  Respiratory:  clear to auscultation bilaterally, normal work of breathing GI: soft, nontender, nondistended, + BS MS: no deformity or atrophy  Skin: warm and dry, no rash Neuro:  Alert and Oriented x 3, Strength and sensation are intact Psych: euthymic mood, full affect  Wt Readings from Last 3 Encounters:  08/08/17 131 lb (59.4 kg)  07/14/17 130 lb 12.8 oz (59.3 kg)  06/02/17 133 lb 6.4 oz (60.5 kg)      Studies/Labs Reviewed:   EKG:  EKG is ordered today.  The ekg ordered today demonstrates Normal sinus rhythm, no  significant ST-T wave changes.  Recent Labs: 08/08/2017: BUN 14; Creatinine, Ser 0.92; Potassium 4.5; Sodium 140   Lipid Panel    Component Value Date/Time   CHOL 40 11/03/2015 0755   TRIG 74 11/16/2015 0515   HDL <10 (L) 11/03/2015 0755   CHOLHDL NOT CALCULATED 11/03/2015 0755   VLDL 10 11/03/2015 0755   LDLCALC NOT CALCULATED 11/03/2015 0755    Additional studies/ records that were reviewed today include:   Echo 12/31/2014 LV EF: 55%  Study Conclusions  - Left ventricle: The cavity size was normal. Wall thickness was normal. The estimated ejection fraction was 55%. Wall motion was normal; there were no regional wall motion abnormalities. Doppler parameters are consistent with abnormal left ventricular relaxation (grade 1 diastolic dysfunction). - Aortic valve: There was no stenosis. - Mitral valve: There was no significant regurgitation. - Right ventricle: The cavity size was normal. Systolic function was normal. - Atrial septum: Atrial septal aneurysm noted. - Pulmonary arteries: No complete TR doppler jet so unable to estimate PA systolic pressure. - Inferior vena cava: The vessel was normal in size. The respirophasic diameter changes were in the normal range (>= 50%), consistent with normal central venous pressure.  Impressions:  - Normal LV size and systolic function, EF 30%. Normal RV size and systolic function. No significant valvular abnormalities.   ASSESSMENT:    1. Precordial chest pain   2. History of cardiac cath   3. Chest pain with high risk for cardiac etiology   4. Alcohol abuse   5. Essential hypertension   6. Hyperlipidemia, unspecified hyperlipidemia type      PLAN:  In order of problems listed above:  1. Precordial chest pain: He has 2 types of chest pain, while which is clearly musculoskeletal, however a second type of chest pain is deeper  according to the patient. Previous cardiac catheterization in 2011 showed  minimal disease. I recommended a coronary CT with FFR to further assess. We'll need to obtain basic metabolic panel prior to coronary CT  2. History of EtOH abuse: According to the patient, he still drinks occasionally but not as much as before. History of alcohol-induced pancreatitis and V. fib arrest in the setting of electrolyte imbalance.  3. Hypertension: Blood pressure stable  4. Hyperlipidemia: On Lipitor 40 mg daily, he is due for fasting lipid panel which is ordered by his primary care provider.    Medication Adjustments/Labs and Tests Ordered: Current medicines are reviewed at length with the patient today.  Concerns regarding medicines are outlined above.  Medication changes, Labs and Tests ordered today are listed in the Patient Instructions below. Patient Instructions  Medication Instructions:  Your physician recommends that you continue on your current medications as directed. Please refer to the Current Medication list given to you today.  Labwork: Your physician recommends that you return for lab work in: BMP by the end of the week fasting   Testing/Procedures: Please arrive at the [facility] main entrance of [facility] at ____:_____ AM/PM (30-45 minutes prior to test start time)  [facility]  [place], [place] [address]  [phone]  Proceed to the [facility] Radiology Department (First Floor).  Please follow these instructions carefully (unless otherwise directed):  Hold all erectile dysfunction medications at least 48 hours prior to test.  On the Night Before the Test:  * Drink plenty of water.  * Do not consume any caffeinated/decaffeinated beverages or chocolate 12 hours prior to your test.  * Do not take any antihistamines 12 hours prior to your test.  On the Day of the Test:  * Drink plenty of water. Do not drink any water within one hour of the test.  * Do not eat any food 4 hours prior to the test.  * You may take  your regular medications prior to the test.  * IF NOT ON A BETA BLOCKER - Take 50 mg of lopressor (metoprolol) one hour before the test.  After the Test:  * Drink plenty of water.  * After receiving IV contrast, you may experience a mild flushed feeling. This is normal.  * On occasion, you may experience a mild rash up to 24 hours after the test. This is not dangerous. If this occurs, you can take Benadryl 25 mg and increase your fluid intake.  * If you experience trouble breathing, this can be serious. If it is severe call 911 IMMEDIATELY. If it is mild, please call our office.   Follow-Up: Your physician wants you to follow-up in: 6 months with Dr Rayann Heman. You will receive a reminder letter in the mail two months in advance. If you don't receive a letter, please call our office to schedule the follow-up appointment.  IF TEST COMES BACK POSITIVE YOUR APPOINTMENT WILL BE SCHEDULED SOONER  Any Other Special Instructions Will Be Listed Below (If Applicable).  If you need a refill on your cardiac medications before your next appointment, please call your pharmacy.     Hilbert Corrigan, [place]  08/10/2017 5:36 AM    [address], [place], [place]  [address] Phone: [phone]; Fax: [phone]

## 2017-08-09 ENCOUNTER — Other Ambulatory Visit: Payer: Self-pay | Admitting: Family Medicine

## 2017-08-10 ENCOUNTER — Encounter: Payer: Self-pay | Admitting: Physician Assistant

## 2017-08-10 ENCOUNTER — Telehealth: Payer: Self-pay

## 2017-08-10 NOTE — Progress Notes (Signed)
Kidney function and electrolyte ok for upcoming coronary CT

## 2017-08-10 NOTE — Telephone Encounter (Signed)
Called patient to give him his results from labs that were completed on Tuesday when he seen Almyra Deforest, [place]. He stated that he did not know when his Coronary CT was and he wanted to check the date. I asked him if he stopped at check out and he stated he didn't remember. I told him I would look into and have one of the scheulling ladies call him to set up an appointment. He voiced understanding. Message sent to admin pool to have test scheduled.  Patient directly notified.

## 2017-08-26 ENCOUNTER — Other Ambulatory Visit: Payer: Self-pay | Admitting: Family Medicine

## 2017-09-01 ENCOUNTER — Ambulatory Visit (HOSPITAL_COMMUNITY)
Admission: RE | Admit: 2017-09-01 | Discharge: 2017-09-01 | Disposition: A | Discharge status: Home / Self Care | Payer: Medicare Other | Source: Ambulatory Visit | Attending: Physician Assistant | Admitting: Physician Assistant

## 2017-09-01 ENCOUNTER — Ambulatory Visit (HOSPITAL_COMMUNITY): Admission: RE | Admit: 2017-09-01 | Payer: Medicare Other | Source: Ambulatory Visit

## 2017-09-01 DIAGNOSIS — I700 Atherosclerosis of aorta: Secondary | ICD-10-CM | POA: Insufficient documentation

## 2017-09-01 DIAGNOSIS — R079 Chest pain, unspecified: Secondary | ICD-10-CM | POA: Insufficient documentation

## 2017-09-01 DIAGNOSIS — Z9889 Other specified postprocedural states: Secondary | ICD-10-CM | POA: Insufficient documentation

## 2017-09-01 DIAGNOSIS — R072 Precordial pain: Secondary | ICD-10-CM | POA: Insufficient documentation

## 2017-09-01 LAB — POCT I-STAT CREATININE: Creatinine, Ser: 0.7 mg/dL (ref 0.61–1.24)

## 2017-09-01 MED ORDER — METOPROLOL TARTRATE 5 MG/5ML IV SOLN: 5.0000 mg | INTRAVENOUS | Status: DC | PRN

## 2017-09-01 MED ORDER — NITROGLYCERIN 0.4 MG SL SUBL
SUBLINGUAL_TABLET | SUBLINGUAL | Status: AC
  Filled 2017-09-01: qty 2

## 2017-09-01 MED ORDER — METOPROLOL TARTRATE 5 MG/5ML IV SOLN
INTRAVENOUS | Status: AC
  Filled 2017-09-01: qty 10

## 2017-09-01 MED ORDER — IOPAMIDOL (ISOVUE-370) INJECTION 76%
INTRAVENOUS | Status: AC
  Administered 2017-09-01: 100 mL
  Filled 2017-09-01: qty 100

## 2017-09-01 MED ORDER — NITROGLYCERIN 0.4 MG SL SUBL: 0.8000 mg | SUBLINGUAL_TABLET | Freq: Once | SUBLINGUAL | Status: DC

## 2017-09-19 ENCOUNTER — Other Ambulatory Visit: Payer: Self-pay | Admitting: Family Medicine

## 2017-10-20 ENCOUNTER — Other Ambulatory Visit: Payer: Self-pay | Admitting: Family Medicine

## 2017-10-20 ENCOUNTER — Telehealth: Payer: Self-pay | Admitting: *Deleted

## 2017-10-20 ENCOUNTER — Ambulatory Visit (INDEPENDENT_AMBULATORY_CARE_PROVIDER_SITE_OTHER): Payer: Medicare Other | Admitting: Family Medicine

## 2017-10-20 ENCOUNTER — Encounter: Payer: Self-pay | Admitting: Family Medicine

## 2017-10-20 ENCOUNTER — Other Ambulatory Visit: Payer: Self-pay

## 2017-10-20 DIAGNOSIS — Z8673 Personal history of transient ischemic attack (TIA), and cerebral infarction without residual deficits: Secondary | ICD-10-CM

## 2017-10-20 DIAGNOSIS — F528 Other sexual dysfunction not due to a substance or known physiological condition: Secondary | ICD-10-CM | POA: Diagnosis not present

## 2017-10-20 DIAGNOSIS — F17200 Nicotine dependence, unspecified, uncomplicated: Secondary | ICD-10-CM

## 2017-10-20 DIAGNOSIS — F320 Major depressive disorder, single episode, mild: Secondary | ICD-10-CM

## 2017-10-20 MED ORDER — ASPIRIN 81 MG PO TABS: 81.0000 mg | ORAL_TABLET | Freq: Every day | ORAL | 3 refills | Status: DC

## 2017-10-20 NOTE — Telephone Encounter (Signed)
Pt calling in stating that insurance company said he had no medicines on file, and he was [place] to file it again. Attempted to call pt to get info, no answer and voicemail is full. Please advise. Diahn Waidelich Kennon Holter, CMA

## 2017-10-20 NOTE — Patient Instructions (Signed)
Call your insurance company about what depression medication they will cover. Let us know the answer.  Sent in aspirin 81mg  daily for you to take  See handout below  Referring to urologist  Follow up with me in 3 months, sooner if needed  Be well, Dr. Ardelia Mems    Steps to Quit Smoking Smoking tobacco can be bad for your health. It can also affect almost every organ in your body. Smoking puts you and people around you at risk for many serious long-lasting (chronic) diseases. Quitting smoking is hard, but it is one of the best things that you can do for your health. It is never too late to quit. What are the benefits of quitting smoking? When you quit smoking, you lower your risk for getting serious diseases and conditions. They can include:  Lung cancer or lung disease.  Heart disease.  Stroke.  Heart attack.  Not being able to have children (infertility).  Weak bones (osteoporosis) and broken bones (fractures).  If you have coughing, wheezing, and shortness of breath, those symptoms may get better when you quit. You may also get sick less often. If you are pregnant, quitting smoking can help to lower your chances of having a baby of low birth weight. What can I do to help me quit smoking? Talk with your doctor about what can help you quit smoking. Some things you can do (strategies) include:  Quitting smoking totally, instead of slowly cutting back how much you smoke over a period of time.  Going to in-person counseling. You are more likely to quit if you go to many counseling sessions.  Using resources and support systems, such as: ? Database administrator with a Social worker. ? Phone quitlines. ? Careers information officer. ? Support groups or group counseling. ? Text messaging programs. ? Mobile phone apps or applications.  Taking medicines. Some of these medicines may have nicotine in them. If you are pregnant or breastfeeding, do not take any medicines to quit smoking unless  your doctor says it is okay. Talk with your doctor about counseling or other things that can help you.  Talk with your doctor about using more than one strategy at the same time, such as taking medicines while you are also going to in-person counseling. This can help make quitting easier. What things can I do to make it easier to quit? Quitting smoking might feel very hard at first, but there is a lot that you can do to make it easier. Take these steps:  Talk to your family and friends. Ask them to support and encourage you.  Call phone quitlines, reach out to support groups, or work with a Social worker.  Ask people who smoke to not smoke around you.  Avoid places that make you want (trigger) to smoke, such as: ? Bars. ? Parties. ? Smoke-break areas at work.  Spend time with people who do not smoke.  Lower the stress in your life. Stress can make you want to smoke. Try these things to help your stress: ? Getting regular exercise. ? Deep-breathing exercises. ? Yoga. ? Meditating. ? Doing a body scan. To do this, close your eyes, focus on one area of your body at a time from head to toe, and notice which parts of your body are tense. Try to relax the muscles in those areas.  Download or buy apps on your mobile phone or tablet that can help you stick to your quit plan. There are many free apps, such as QuitGuide  from the CDC [employer] for Disease Control and Prevention). You can find more support from smokefree.gov and other websites.  This information is not intended to replace advice given to you by your health care provider. Make sure you discuss any questions you have with your health care provider. Document Released: 07/23/2009 Document Revised: 05/24/2016 Document Reviewed: 02/10/2015 Elsevier Interactive Patient Education  2018 [employer].

## 2017-10-24 ENCOUNTER — Other Ambulatory Visit: Payer: Self-pay | Admitting: *Deleted

## 2017-10-25 NOTE — Assessment & Plan Note (Signed)
Given handout on tips for quitting smoking

## 2017-10-25 NOTE — Progress Notes (Signed)
Date of Visit: 10/20/2017   HPI:  K[place]y presents for routine follow up.  Depression - has been out of zoloft for 2 months. His insurance is apparently not covering it anymore. Mood is up and down but generally stable. No SI/HI.  Smoking - still smokes, but less than 1 pack per day. Interested in tips for quitting smoking.  Erectile dysfunction - is interested in possible penile prosthesis or other means of treating ED, willing to see urology. Needs referral.  Hypertension - not taking amlodipine or carvedilol at present. Does not think his insurance covers it. Has upcoming follow up scheduled with cardiologist to review his coronary CT.  ROS: See HPI.  [place]: history of hyperlipidemia, depression, hypertension, seizures, chronic pancreatitis, ETOH cirrhosis, prior CVA with resultant anoxic brain damage  PHYSICAL EXAM: BP 104/74   Pulse 87   Temp 97.9 F (36.6 C) (Oral)   Ht 5\' 8"  (1.727 m)   Wt 127 lb (57.6 kg)   SpO2 99%   BMI 19.31 kg/m  Gen: no acute distress, pleasant, cooperative HEENT: normocephalic, atraumatic, moist mucous membranes  Heart: regular rate and rhythm, no murmur Lungs: clear to auscultation bilaterally normal work of breathing  Neuro: alert, R hand contracted at baseline Ext: No appreciable lower extremity edema bilaterally  Psych: normal range of affect, well groomed, speech normal in rate and volume, normal eye contact   ASSESSMENT/PLAN:  Major depression, single episode He will call his insurance company to find out what depression medications they will cover. He'll let me know & we'll decide on medications from there.  History of CVA (cerebrovascular accident) Add aspirin 81mg  daily for history of CVA and possible CAD on coronary CT (has upcoming appointment with cardiology scheduled to review this)  CIGARETTE SMOKER Given handout on tips for quitting smoking  ERECTILE DYSFUNCTION Refer to urology for evaluation  FOLLOW UP: Follow up in 3  mos for above issues Referring to urology  [place] J. Ardelia Mems, [place]

## 2017-10-25 NOTE — Assessment & Plan Note (Signed)
He will call his insurance company to find out what depression medications they will cover. He'll let me know & we'll decide on medications from there.

## 2017-10-25 NOTE — Assessment & Plan Note (Signed)
Add aspirin 81mg  daily for history of CVA and possible CAD on coronary CT (has upcoming appointment with cardiology scheduled to review this)

## 2017-10-25 NOTE — Assessment & Plan Note (Signed)
Refer to urology for evaluation

## 2017-10-26 MED ORDER — SERTRALINE HCL 50 MG PO TABS: 50.0000 mg | ORAL_TABLET | Freq: Every day | ORAL | 1 refills | Status: DC

## 2017-10-26 NOTE — Telephone Encounter (Signed)
Called patient to clarify. He needs new rx's called in. Advised we will hold amlodipine since his blood pressure was low normal last visit. I will send in sertraline, carvedilol, and keppra. Patient appreciative Leeanne Rio, MD

## 2017-11-24 ENCOUNTER — Other Ambulatory Visit: Payer: Self-pay | Admitting: *Deleted

## 2017-11-24 MED ORDER — SERTRALINE HCL 50 MG PO TABS: 50.0000 mg | ORAL_TABLET | Freq: Every day | ORAL | 1 refills | Status: DC

## 2017-11-30 ENCOUNTER — Encounter: Payer: Self-pay | Admitting: Internal Medicine

## 2017-12-03 ENCOUNTER — Other Ambulatory Visit: Payer: Self-pay | Admitting: Family Medicine

## 2017-12-05 DIAGNOSIS — N5201 Erectile dysfunction due to arterial insufficiency: Secondary | ICD-10-CM | POA: Diagnosis not present

## 2017-12-05 DIAGNOSIS — N401 Enlarged prostate with lower urinary tract symptoms: Secondary | ICD-10-CM | POA: Diagnosis not present

## 2017-12-05 DIAGNOSIS — R350 Frequency of micturition: Secondary | ICD-10-CM | POA: Diagnosis not present

## 2017-12-06 ENCOUNTER — Telehealth: Payer: Self-pay | Admitting: *Deleted

## 2017-12-06 NOTE — Telephone Encounter (Signed)
Needs prior auth. He has to have three 50mg  pills because he cannot break up the tablets (can't do one and a half 100mg  tablets) because of his motor deficits from a prior stroke.  Thanks! Leeanne Rio, MD

## 2017-12-06 NOTE — Telephone Encounter (Signed)
Received fax from pharmacy.  Rx will need a prior auth if pt is taking for than 2 tabs daily.  Can dosage of tablet be changed or do you want to start a PA? [place], [name], CMA

## 2017-12-08 NOTE — Telephone Encounter (Signed)
Spoke with PA department at [facility]. PA approved 10/10/17 -10/09/18. Fax to follow. [facility] notified. Danley Danker, RN [facility] RN)

## 2018-01-17 ENCOUNTER — Ambulatory Visit (INDEPENDENT_AMBULATORY_CARE_PROVIDER_SITE_OTHER): Payer: Medicare Other | Admitting: Internal Medicine

## 2018-01-17 ENCOUNTER — Encounter: Payer: Self-pay | Admitting: Internal Medicine

## 2018-01-17 VITALS — BP 120/70 | HR 82 | Ht 68.0 in | Wt 116.0 lb

## 2018-01-17 DIAGNOSIS — I10 Essential (primary) hypertension: Secondary | ICD-10-CM | POA: Diagnosis not present

## 2018-01-17 DIAGNOSIS — I469 Cardiac arrest, cause unspecified: Secondary | ICD-10-CM

## 2018-01-17 NOTE — Patient Instructions (Addendum)
Medication Instructions:  Your physician recommends that you continue on your current medications as directed. Please refer to the Current Medication list given to you today.  Labwork: None ordered.  Testing/Procedures: None ordered.  Follow-Up: Your physician wants you to follow-up in: one year with general cardiology.   You will receive a reminder letter in the mail two months in advance. If you don't receive a letter, please call our office to schedule the follow-up appointment.  Any Other Special Instructions Will Be Listed Below (If Applicable).  Please stop smoking  If you need a refill on your cardiac medications before your next appointment, please call your pharmacy.

## 2018-01-17 NOTE — Progress Notes (Signed)
Electrophysiology Office Note   Date:  01/17/2018   ID:  Steven Frey, DOB 1956-09-13, MRN [id number]  PCP:  Leeanne Rio, MD    Primary Electrophysiologist: Thompson Grayer, MD    CC: prolonged qt in the past during metabolic derrangement   History of Present Illness: Steven Frey is a 62 y.o. male who presents today for electrophysiology evaluation.   I have not seen him since 2014.  He more recently saw Almyra Deforest 08/08/17.  He has done reasonably well.  He has had ongoing bouts with chronic pancreatitis.  He has had prior L iliac artery thrombus which was found incidentally.   Upon follow-up in October, he presented with chest pain.  Given normal cath 2011 and atypical nature, he underwent cardiac CT which did not show obstructiv e CAD.  He did have nonobstructive disease with elevated calcium score. He has done reasonably well since that time.  Not very active. Still smoking and drinking, though he states that he has cut back Today, he denies symptoms of palpitations, chest pain, shortness of breath, orthopnea, PND, lower extremity edema, claudication, dizziness, presyncope, syncope, bleeding, or neurologic sequela. The patient is tolerating medications without difficulties and is otherwise without complaint today.    Past Medical History:  Diagnosis Date  . 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 Anemia   . Angioedema    rx requiring intubation/vent support suspected secondary to ACE1 (but also on zithromax) 5/09 hospitalization   . Anoxic brain damage (HCC)   . Arthritis   . Chronic pain 2011  . Chronic pancreatitis ([place])    (10/08 [place]). admx 02/2010 pseudocyst aspirated during admxn. pancreatic dual stent placement at [facility] 11/11  . Cirrhosis ([place]) 2011   due to ETOH with recent hepatic abcess and possible HCC.   Marland Kitchen COPD (chronic obstructive pulmonary disease) ([place])    DOE  . Depression with anxiety    no hx of meds  . Diabetes mellitus without complication ([place])    Type  2, pt reports no longer takes meds & A1C was normal.  . Edema   . ETOH abuse   . Gastritis    alcohol induced  . GERD (gastroesophageal reflux disease)   . History of stroke 2011   reports it was caused by pain from pancreatitis- caused heart to stop and stroke   . Hyperlipidemia 2011  . Hypertension 2011  . Inguinal hernia 2011  . Myocardial infarction ([place])   . Pneumonia    hx of  . Seizures ([place]) 2011   had seizure 11-24-14/went to ED per pt.   . Smoker   . Stroke [facility])    right sided weakness; affected long term memory  . 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 VF (ventricular fibrillation) ([place]) 2011   arrest 6/11. successfully rescucitated w/prolinged hospitalization due to respitatory failure. QT prolongation during cooling, now resolved   Past Surgical History:  Procedure Laterality Date  . [place]   right  . INGUINAL HERNIA REPAIR Left 07/12/2013   Procedure: HERNIA REPAIR INGUINAL ADULT;  Surgeon: Adin Hector, MD;  Location: [place];  Service: General;  Laterality: Left;  . INSERTION OF MESH Left 07/12/2013   Procedure: INSERTION OF MESH;  Surgeon: Adin Hector, MD;  Location: [place];  Service: General;  Laterality: Left;  . pancreatic stent placement    . right wrist ORIF  1976   for fx     Current Outpatient  Medications  Medication Sig Dispense Refill  . acetaminophen (TYLENOL) 500 MG tablet Take 500 mg by mouth 2 (two) times daily as needed.    Marland Kitchen amLODipine (NORVASC) 5 MG tablet TAKE 1 TABLET BY MOUTH EVERY DAY 30 tablet 1  . aspirin 81 MG tablet Take 1 tablet (81 mg total) by mouth daily. 90 tablet 3  . atorvastatin (LIPITOR) 40 MG tablet TAKE 1 TABLET BY MOUTH EVERY DAY 30 tablet 1  . carvedilol (COREG) 3.125 MG tablet TAKE 1 TABLET BY MOUTH TWICE A DAY WITH A MEAL 180 tablet 1  . CREON 12000 units CPEP capsule TAKE 1 CAPSULE BY MOUTH 3 TIMES A DAY BEFORE MEALS 90 capsule 5  . [facility] B-1 100 MG tablet  TAKE 1 TABLET BY MOUTH EVERY DAY 100 tablet 0  . [facility] B-12 1000 MCG TBCR TAKE 1 TABLET (1,000 MCG TOTAL) BY MOUTH DAILY. 90 tablet 0  . folic acid (FOLVITE) 1 MG tablet TAKE 1 TABLET BY MOUTH EVERY DAY 90 tablet 0  . levETIRAcetam (KEPPRA) 1000 MG tablet TAKE 1 TABLET BY MOUTH TWICE A DAY 180 tablet 1  . sertraline (ZOLOFT) 50 MG tablet Take 1 tablet (50 mg total) by mouth daily. 90 tablet 1  . tamsulosin (FLOMAX) 0.4 MG CAPS capsule TAKE 2 CAPSULES (0.8 MG TOTAL) BY MOUTH DAILY. (Patient not taking: Reported on 01/17/2018) 60 capsule 0   No current facility-administered medications for this visit.     Allergies:   Ace inhibitors; Azithromycin; and Lisinopril   Social History:  The patient  reports that he has been smoking cigarettes.  He has been smoking about 0.25 packs per day. He has never used smokeless tobacco. He reports that he drinks alcohol. He reports that he does not use drugs.   Family History:  The patient's  family history includes Alcohol abuse in his father, mother, and unknown relative; Breast cancer in his paternal grandmother; Diabetes in his sister; Early death in his mother; Hyperlipidemia in his sister; Hypertension in his sister; Prostate cancer (age of onset: 24) in his father.    ROS:  Please see the history of present illness.   All other systems are personally reviewed and negative.    PHYSICAL EXAM: VS:  BP 120/70   Pulse 82   Ht 5\' 8"  (1.727 m)   Wt 116 lb (52.6 kg)   BMI 17.64 kg/m  , BMI Body mass index is 17.64 kg/m. GEN: thin and chronically ill, in no acute distress  HEENT: normal  Neck: no JVD, carotid bruits, or masses Cardiac: RRR; no murmurs, rubs, or gallops,no edema  Respiratory:  clear to auscultation bilaterally, normal work of breathing GI: soft, nontender, nondistended, + BS MS: R arm very spastic Skin: warm and dry  Neuro:  Strength and sensation are intact Psych: euthymic mood, full affect  EKG:  EKG is ordered today. The ekg  ordered today is personally reviewed and shows sinus rhythm 82 bpm, PR 178 msec, QTc 450 msec   Recent Labs: 08/08/2017: BUN 14; Potassium 4.5; Sodium 140 09/01/2017: Creatinine, Ser 0.70  personally reviewed   Lipid Panel     Component Value Date/Time   CHOL 40 11/03/2015 0755   TRIG 74 11/16/2015 0515   HDL <10 (L) 11/03/2015 0755   CHOLHDL NOT CALCULATED 11/03/2015 0755   VLDL 10 11/03/2015 0755   LDLCALC NOT CALCULATED 11/03/2015 0755   personally reviewed   Wt Readings from Last 3 Encounters:  01/17/18 116 lb (52.6 kg)  10/20/17 127 lb (57.6 kg)  08/08/17 131 lb (59.4 kg)      Other studies personally reviewed: Additional studies/ records that were reviewed today include: recent cardiac CT,  Echo 2016, Hao Meng's notes Review of the above records today demonstrates: as above   ASSESSMENT AND PLAN:  1.  Atypical chest pain Improved Low risk cardiac CT reviewed Smoking cessation, ASA, statin  2. H/o ETOH Still drinking With pancreatitis and prior issues with electrolytes, complete abstinence is advised  3. HTN Stable No change required today  4. HL Followed by PCP  5. Tobacco Cessation advised  6. Prior cardiac arrest In the setting of malnutrition, ETOh, and metabolic derangements No indication for ICD or further EP workup Lifestyle modification is required  Follow-up:  General Cardiology APP in 1 year I will see as needed  Current medicines are reviewed at length with the patient today.   The patient does not have concerns regarding his medicines.  The following changes were made today:  none  Labs/ tests ordered today include:  Orders Placed This Encounter  Procedures  . EKG 12-Lead     Signed, Thompson Grayer, MD  01/17/2018 10:39 AM     [facility] [address] [place] [address] [phone] (office) [phone] (fax)

## 2018-01-29 ENCOUNTER — Other Ambulatory Visit: Payer: Self-pay | Admitting: Family Medicine

## 2018-02-09 ENCOUNTER — Other Ambulatory Visit: Payer: Self-pay | Admitting: Family Medicine

## 2018-03-08 ENCOUNTER — Other Ambulatory Visit: Payer: Self-pay | Admitting: Family Medicine

## 2018-03-08 NOTE — Telephone Encounter (Signed)
Patient past due for follow up with me, please ask him to schedule an appointment Thanks! Leeanne Rio, MD

## 2018-03-09 NOTE — Telephone Encounter (Signed)
Attempted to call patient to schedule a follow up appointment and there was no answer and no voice mail. Will try at another time.  Steven Frey, [place]

## 2018-03-15 ENCOUNTER — Telehealth: Payer: Self-pay

## 2018-03-15 NOTE — Telephone Encounter (Signed)
Called patient to make an appointment and  The person who answered the phone was incoherent. I kept asking for someone else but the person kept repeating the word yes in a drawn out tone. Not sure if this was patient or not.  If patient should happen to call back, please make an appointment for patient with Dr. Ardelia Mems.  Ozella Almond, [place]

## 2018-03-20 ENCOUNTER — Telehealth: Payer: Self-pay

## 2018-03-20 NOTE — Telephone Encounter (Signed)
Still not able to get in touch with patient by phone. Attempted once again and no answer or voice mail. Steven Frey, [place]

## 2018-04-01 ENCOUNTER — Other Ambulatory Visit: Payer: Self-pay | Admitting: Family Medicine

## 2018-04-06 DIAGNOSIS — H35371 Puckering of macula, right eye: Secondary | ICD-10-CM | POA: Diagnosis not present

## 2018-04-06 DIAGNOSIS — H40013 Open angle with borderline findings, low risk, bilateral: Secondary | ICD-10-CM | POA: Diagnosis not present

## 2018-04-06 DIAGNOSIS — H35363 Drusen (degenerative) of macula, bilateral: Secondary | ICD-10-CM | POA: Diagnosis not present

## 2018-04-06 DIAGNOSIS — H35033 Hypertensive retinopathy, bilateral: Secondary | ICD-10-CM | POA: Diagnosis not present

## 2018-04-15 ENCOUNTER — Other Ambulatory Visit: Payer: Self-pay | Admitting: Family Medicine

## 2018-04-18 ENCOUNTER — Telehealth: Payer: Self-pay

## 2018-04-18 NOTE — Telephone Encounter (Signed)
Pt informed that he needs to make an appt. appt with you next week. Steven Frey Kennon Holter, CMA

## 2018-04-18 NOTE — Telephone Encounter (Signed)
Pt called nurse line asking why his medications were denied by pcp. Medications requested, Creon and Lipitor. Please advise.

## 2018-04-19 MED ORDER — PANCRELIPASE (LIP-PROT-AMYL) 12000-38000 UNITS PO CPEP: ORAL_CAPSULE | ORAL | 0 refills | Status: DC

## 2018-04-19 MED ORDER — ATORVASTATIN CALCIUM 40 MG PO TABS: 40.0000 mg | ORAL_TABLET | Freq: Every day | ORAL | 0 refills | Status: DC

## 2018-04-19 NOTE — Telephone Encounter (Signed)
Sent in a months supply of these medications for patient Please let him know that I'll see him next week but his medications should be at the pharmacy Thanks Leeanne Rio, MD

## 2018-04-19 NOTE — Addendum Note (Signed)
Addended by: Leeanne Rio on: 04/19/2018 11:03 AM   Modules accepted: Orders

## 2018-04-19 NOTE — Telephone Encounter (Signed)
Pt informed and stated that he was going to be at his appt.[name] Kennon Holter, CMA

## 2018-04-24 ENCOUNTER — Other Ambulatory Visit: Payer: Self-pay | Admitting: Family Medicine

## 2018-04-27 ENCOUNTER — Ambulatory Visit (INDEPENDENT_AMBULATORY_CARE_PROVIDER_SITE_OTHER): Payer: Medicare Other | Admitting: Family Medicine

## 2018-04-27 ENCOUNTER — Other Ambulatory Visit: Payer: Self-pay

## 2018-04-27 ENCOUNTER — Encounter: Payer: Self-pay | Admitting: Family Medicine

## 2018-04-27 DIAGNOSIS — R569 Unspecified convulsions: Secondary | ICD-10-CM

## 2018-04-27 DIAGNOSIS — E785 Hyperlipidemia, unspecified: Secondary | ICD-10-CM

## 2018-04-27 DIAGNOSIS — R358 Other polyuria: Secondary | ICD-10-CM | POA: Diagnosis not present

## 2018-04-27 DIAGNOSIS — R3589 Other polyuria: Secondary | ICD-10-CM

## 2018-04-27 DIAGNOSIS — F320 Major depressive disorder, single episode, mild: Secondary | ICD-10-CM

## 2018-04-27 MED ORDER — SERTRALINE HCL 100 MG PO TABS: 100.0000 mg | ORAL_TABLET | Freq: Every day | ORAL | 5 refills | Status: DC

## 2018-04-27 MED ORDER — TAMSULOSIN HCL 0.4 MG PO CAPS: 0.4000 mg | ORAL_CAPSULE | Freq: Every day | ORAL | 2 refills | Status: DC

## 2018-04-27 NOTE — Progress Notes (Signed)
Date of Visit: 04/27/2018   HPI:  Patient presents for follow up.  Depression - taking zoloft 50mg  daily. Tolerating well. Has ups and downs with his mood. No SI/HI. Thinks he would benefit from higher dose.  Urinary frequency - saw urology and is due for follow up, dx'd with BPH. At that time had been taking flomax and urology recommended adding detrol, but patient does not seem to recall this. Has not been taking any flomax lately but would like refill. Having urinary frequency but no dysuria.  Seizure disorder - taking keppra 1000mg  twice daily. Tolerating well. No seizures in years.  Chest pain - patient endorses on ROS having chest pain that lasts less than 1 minute, about once a week for 6 months. Saw cardiology for this back in the fall. Pain unchanged since then. Had coronary CT scan which showed elevated calcium score and nonobstructive CAD.  ROS: See HPI.  [place]: history of CVA, depression, hyperlipidemia, polyuria, hypertension, seizures, chronic pancreatitis, alcoholic cirrrhosis  PHYSICAL EXAM: BP 130/72   Pulse 74   Temp 97.9 F (36.6 C) (Oral)   Ht 5\' 8"  (1.727 m)   Wt 117 lb 6.4 oz (53.3 kg)   SpO2 97%   BMI 17.85 kg/m  Gen: no acute distress, pleasant, cooperative HEENT: normocephalic, atraumatic, moist mucous membranes  Heart: regular rate and rhythm, no murmur Lungs: clear to auscultation bilaterally, normal work of breathing  Neuro: alert, R hand chronically contracted Ext: No appreciable lower extremity edema bilaterally   ASSESSMENT/PLAN:  Health maintenance:  -UTD on HM items  Major depression, single episode Stable but could stand to have better control Increase zoloft to 180mmg daily. If not improved in a month or so patient will call for an appointment Otherwise see me in 4 months.  Polyuria Diagnosed with BPH at urology office. Refill flomax, encouraged patient to follow up with urology as previously recommended.  Seizures ([place]) Well  controlled on keppra.  Chest pain unchanged since cardiology evaluation. Continue to monitor.  FOLLOW UP: Follow up in 4 months with me for routine issues, or sooner if depression not better on higher dose of zoloft Schedule follow up with urology  [place] J. Ardelia Mems, [place]

## 2018-04-27 NOTE — Patient Instructions (Addendum)
Schedule follow up with the urologist.  Sent in higher dose of zoloft. Come back if not better in 1 month. Otherwise see me in 4 months.  Sent in flomax. Checking labwork today  Be well, Dr. Ardelia Mems

## 2018-04-28 ENCOUNTER — Other Ambulatory Visit: Payer: Self-pay | Admitting: Family Medicine

## 2018-04-28 LAB — CMP14+EGFR
ALT: 29 IU/L (ref 0–44)
AST: 22 IU/L (ref 0–40)
Albumin/Globulin Ratio: 1.8 (ref 1.2–2.2)
Albumin: 4.7 g/dL (ref 3.6–4.8)
Alkaline Phosphatase: 102 IU/L (ref 39–117)
BUN/Creatinine Ratio: 15 (ref 10–24)
BUN: 12 mg/dL (ref 8–27)
Bilirubin Total: 0.8 mg/dL (ref 0.0–1.2)
CO2: 21 mmol/L (ref 20–29)
Calcium: 9.7 mg/dL (ref 8.6–10.2)
Chloride: 102 mmol/L (ref 96–106)
Creatinine, Ser: 0.8 mg/dL (ref 0.76–1.27)
GFR calc Af Amer: 111 mL/min/{1.73_m2} (ref >59)
GFR calc non Af Amer: 96 mL/min/{1.73_m2} (ref >59)
Globulin, Total: 2.6 g/dL (ref 1.5–4.5)
Glucose: 281 mg/dL — ABNORMAL HIGH (ref 65–99)
Potassium: 4.9 mmol/L (ref 3.5–5.2)
Sodium: 139 mmol/L (ref 134–144)
Total Protein: 7.3 g/dL (ref 6.0–8.5)

## 2018-04-28 LAB — LIPID PANEL
Chol/HDL Ratio: 1.9 ratio (ref 0.0–5.0)
Cholesterol, Total: 128 mg/dL (ref 100–199)
HDL: 68 mg/dL (ref >39)
LDL Calculated: 36 mg/dL (ref 0–99)
Triglycerides: 121 mg/dL (ref 0–149)
VLDL Cholesterol Cal: 24 mg/dL (ref 5–40)

## 2018-04-30 NOTE — Assessment & Plan Note (Signed)
Well controlled on keppra.

## 2018-04-30 NOTE — Assessment & Plan Note (Signed)
Diagnosed with BPH at urology office. Refill flomax, encouraged patient to follow up with urology as previously recommended.

## 2018-04-30 NOTE — Assessment & Plan Note (Signed)
Stable but could stand to have better control Increase zoloft to 162mmg daily. If not improved in a month or so patient will call for an appointment Otherwise see me in 4 months.

## 2018-05-04 ENCOUNTER — Telehealth: Payer: Self-pay | Admitting: Family Medicine

## 2018-05-04 DIAGNOSIS — R739 Hyperglycemia, unspecified: Secondary | ICD-10-CM

## 2018-05-04 NOTE — Telephone Encounter (Signed)
Please let patient know his labs look good except that his blood sugar was high. I would like him to come for a lab visit to check his hemoglobin A1c. I have entered the order, please schedule lab visit for patient.  Thanks! Leeanne Rio, MD

## 2018-05-07 ENCOUNTER — Telehealth: Payer: Self-pay

## 2018-05-07 NOTE — Telephone Encounter (Signed)
Attempted to call patient to schedule a lab appointment per Dr. Ardelia Mems to check A1C, but, there was no answer and no voice mail.  Steven Frey, [name]

## 2018-05-08 ENCOUNTER — Telehealth: Payer: Self-pay

## 2018-05-08 NOTE — Telephone Encounter (Signed)
Pt informed and appt made for Friday.Steven Frey, [name], [place]

## 2018-05-08 NOTE — Telephone Encounter (Signed)
Attempted to call patient again to schedule lab visit. No answer and no voice mail.  Steven Frey, [place]

## 2018-05-11 ENCOUNTER — Other Ambulatory Visit: Payer: Medicare Other

## 2018-05-12 ENCOUNTER — Other Ambulatory Visit: Payer: Self-pay | Admitting: Family Medicine

## 2018-05-13 ENCOUNTER — Other Ambulatory Visit: Payer: Self-pay | Admitting: Family Medicine

## 2018-05-21 ENCOUNTER — Other Ambulatory Visit: Payer: Self-pay | Admitting: Family Medicine

## 2018-07-29 ENCOUNTER — Other Ambulatory Visit: Payer: Self-pay | Admitting: Family Medicine

## 2018-07-30 ENCOUNTER — Other Ambulatory Visit: Payer: Self-pay | Admitting: Family Medicine

## 2018-08-07 DIAGNOSIS — H53461 Homonymous bilateral field defects, right side: Secondary | ICD-10-CM | POA: Diagnosis not present

## 2018-08-07 DIAGNOSIS — H401131 Primary open-angle glaucoma, bilateral, mild stage: Secondary | ICD-10-CM | POA: Diagnosis not present

## 2018-08-07 DIAGNOSIS — H40053 Ocular hypertension, bilateral: Secondary | ICD-10-CM | POA: Diagnosis not present

## 2018-08-31 DIAGNOSIS — H40053 Ocular hypertension, bilateral: Secondary | ICD-10-CM | POA: Diagnosis not present

## 2018-08-31 DIAGNOSIS — H401131 Primary open-angle glaucoma, bilateral, mild stage: Secondary | ICD-10-CM | POA: Diagnosis not present

## 2018-10-20 ENCOUNTER — Other Ambulatory Visit: Payer: Self-pay | Admitting: Family Medicine

## 2018-10-22 ENCOUNTER — Other Ambulatory Visit: Payer: Self-pay | Admitting: Family Medicine

## 2018-10-22 NOTE — Telephone Encounter (Signed)
Called patient to make and appointment per Dr. Ardelia Mems.  No answer and no voice mail.  If patient calls please have him set up and appointment.  Steven Frey, [place]

## 2018-10-22 NOTE — Telephone Encounter (Signed)
Please remind patient to schedule follow up with me, thanks! Leeanne Rio, MD

## 2018-10-22 NOTE — Telephone Encounter (Signed)
Attempted to call patient again and there was still no answer or voicemail.  If patient happens to call please let him know that he needs a follow up appointment with Dr. Ardelia Mems.  Thanks,  .Ozella Almond, CMA

## 2018-10-25 ENCOUNTER — Other Ambulatory Visit: Payer: Self-pay | Admitting: Family Medicine

## 2018-11-07 ENCOUNTER — Other Ambulatory Visit: Payer: Self-pay | Admitting: Family Medicine

## 2018-11-07 ENCOUNTER — Telehealth: Payer: Self-pay

## 2018-11-07 NOTE — Telephone Encounter (Signed)
Please ask patient to schedule follow up with me, thanks! Steven Frey J Izzak Fries, MD  

## 2018-11-07 NOTE — Telephone Encounter (Signed)
Attempted to call patient to schedule a follow up appointment with Dr. Ardelia Mems. No answer or voicemail.  If patient calls back please schedule him and appointment with her.  Thanks, .Ozella Almond, CMA

## 2018-11-08 ENCOUNTER — Telehealth: Payer: Self-pay

## 2018-11-08 NOTE — Telephone Encounter (Signed)
Call was second attempt to reach patient.  Steven Frey, [place]

## 2018-11-08 NOTE — Telephone Encounter (Signed)
Attempted to reach patient for a follow up appointment with Dr. Ardelia Mems.  No answer and no voice mail.  Steven Frey, [place]

## 2019-01-31 ENCOUNTER — Other Ambulatory Visit: Payer: Self-pay | Admitting: Family Medicine

## 2019-02-26 ENCOUNTER — Inpatient Hospital Stay (HOSPITAL_COMMUNITY)
Admission: AD | Admit: 2019-02-26 | Discharge: 2019-03-01 | DRG: 640 | Disposition: A | Discharge status: Skilled Nursing Facility | Payer: Medicare Other | Source: Ambulatory Visit | Attending: Family Medicine | Admitting: Family Medicine

## 2019-02-26 ENCOUNTER — Inpatient Hospital Stay (HOSPITAL_COMMUNITY): Payer: Medicare Other

## 2019-02-26 ENCOUNTER — Other Ambulatory Visit: Payer: Self-pay

## 2019-02-26 ENCOUNTER — Inpatient Hospital Stay (HOSPITAL_COMMUNITY)
Admission: AD | Admit: 2019-02-26 | Discharge: 2019-02-26 | Disposition: A | Discharge status: Home / Self Care | Payer: Medicare Other | Source: Ambulatory Visit | Attending: Family Medicine | Admitting: Family Medicine

## 2019-02-26 ENCOUNTER — Encounter (HOSPITAL_COMMUNITY): Payer: Self-pay | Admitting: Physician Assistant

## 2019-02-26 ENCOUNTER — Ambulatory Visit (INDEPENDENT_AMBULATORY_CARE_PROVIDER_SITE_OTHER): Payer: Medicare Other | Admitting: Family Medicine

## 2019-02-26 VITALS — BP 118/62 | HR 87

## 2019-02-26 DIAGNOSIS — Z803 Family history of malignant neoplasm of breast: Secondary | ICD-10-CM

## 2019-02-26 DIAGNOSIS — W19XXXA Unspecified fall, initial encounter: Secondary | ICD-10-CM | POA: Diagnosis present

## 2019-02-26 DIAGNOSIS — Z8249 Family history of ischemic heart disease and other diseases of the circulatory system: Secondary | ICD-10-CM

## 2019-02-26 DIAGNOSIS — I10 Essential (primary) hypertension: Secondary | ICD-10-CM | POA: Diagnosis present

## 2019-02-26 DIAGNOSIS — F17210 Nicotine dependence, cigarettes, uncomplicated: Secondary | ICD-10-CM | POA: Diagnosis present

## 2019-02-26 DIAGNOSIS — F1290 Cannabis use, unspecified, uncomplicated: Secondary | ICD-10-CM | POA: Diagnosis present

## 2019-02-26 DIAGNOSIS — E1151 Type 2 diabetes mellitus with diabetic peripheral angiopathy without gangrene: Secondary | ICD-10-CM | POA: Diagnosis present

## 2019-02-26 DIAGNOSIS — R079 Chest pain, unspecified: Secondary | ICD-10-CM | POA: Diagnosis not present

## 2019-02-26 DIAGNOSIS — D72829 Elevated white blood cell count, unspecified: Secondary | ICD-10-CM | POA: Diagnosis present

## 2019-02-26 DIAGNOSIS — E785 Hyperlipidemia, unspecified: Secondary | ICD-10-CM | POA: Diagnosis present

## 2019-02-26 DIAGNOSIS — Z811 Family history of alcohol abuse and dependence: Secondary | ICD-10-CM

## 2019-02-26 DIAGNOSIS — K219 Gastro-esophageal reflux disease without esophagitis: Secondary | ICD-10-CM | POA: Diagnosis present

## 2019-02-26 DIAGNOSIS — F329 Major depressive disorder, single episode, unspecified: Secondary | ICD-10-CM | POA: Diagnosis present

## 2019-02-26 DIAGNOSIS — F330 Major depressive disorder, recurrent, mild: Secondary | ICD-10-CM | POA: Diagnosis not present

## 2019-02-26 DIAGNOSIS — K860 Alcohol-induced chronic pancreatitis: Secondary | ICD-10-CM | POA: Diagnosis present

## 2019-02-26 DIAGNOSIS — E43 Unspecified severe protein-calorie malnutrition: Secondary | ICD-10-CM | POA: Diagnosis present

## 2019-02-26 DIAGNOSIS — I2510 Atherosclerotic heart disease of native coronary artery without angina pectoris: Secondary | ICD-10-CM | POA: Diagnosis present

## 2019-02-26 DIAGNOSIS — E876 Hypokalemia: Secondary | ICD-10-CM | POA: Diagnosis present

## 2019-02-26 DIAGNOSIS — I253 Aneurysm of heart: Secondary | ICD-10-CM | POA: Diagnosis present

## 2019-02-26 DIAGNOSIS — H547 Unspecified visual loss: Secondary | ICD-10-CM | POA: Diagnosis present

## 2019-02-26 DIAGNOSIS — Z1159 Encounter for screening for other viral diseases: Secondary | ICD-10-CM | POA: Diagnosis not present

## 2019-02-26 DIAGNOSIS — Z681 Body mass index (BMI) 19 or less, adult: Secondary | ICD-10-CM | POA: Diagnosis not present

## 2019-02-26 DIAGNOSIS — S61412A Laceration without foreign body of left hand, initial encounter: Secondary | ICD-10-CM | POA: Diagnosis present

## 2019-02-26 DIAGNOSIS — E1165 Type 2 diabetes mellitus with hyperglycemia: Secondary | ICD-10-CM | POA: Diagnosis not present

## 2019-02-26 DIAGNOSIS — E440 Moderate protein-calorie malnutrition: Secondary | ICD-10-CM

## 2019-02-26 DIAGNOSIS — K861 Other chronic pancreatitis: Secondary | ICD-10-CM | POA: Diagnosis not present

## 2019-02-26 DIAGNOSIS — R55 Syncope and collapse: Secondary | ICD-10-CM

## 2019-02-26 DIAGNOSIS — K7030 Alcoholic cirrhosis of liver without ascites: Secondary | ICD-10-CM | POA: Diagnosis present

## 2019-02-26 DIAGNOSIS — Z888 Allergy status to other drugs, medicaments and biological substances status: Secondary | ICD-10-CM

## 2019-02-26 DIAGNOSIS — S299XXA Unspecified injury of thorax, initial encounter: Secondary | ICD-10-CM | POA: Diagnosis not present

## 2019-02-26 DIAGNOSIS — G40909 Epilepsy, unspecified, not intractable, without status epilepticus: Secondary | ICD-10-CM | POA: Diagnosis present

## 2019-02-26 DIAGNOSIS — G931 Anoxic brain damage, not elsewhere classified: Secondary | ICD-10-CM | POA: Diagnosis not present

## 2019-02-26 DIAGNOSIS — J449 Chronic obstructive pulmonary disease, unspecified: Secondary | ICD-10-CM | POA: Diagnosis present

## 2019-02-26 DIAGNOSIS — Z7982 Long term (current) use of aspirin: Secondary | ICD-10-CM

## 2019-02-26 DIAGNOSIS — Z8674 Personal history of sudden cardiac arrest: Secondary | ICD-10-CM

## 2019-02-26 DIAGNOSIS — R278 Other lack of coordination: Secondary | ICD-10-CM | POA: Diagnosis not present

## 2019-02-26 DIAGNOSIS — R634 Abnormal weight loss: Secondary | ICD-10-CM | POA: Diagnosis not present

## 2019-02-26 DIAGNOSIS — F1020 Alcohol dependence, uncomplicated: Secondary | ICD-10-CM | POA: Diagnosis present

## 2019-02-26 DIAGNOSIS — Z9114 Patient's other noncompliance with medication regimen: Secondary | ICD-10-CM

## 2019-02-26 DIAGNOSIS — G9389 Other specified disorders of brain: Secondary | ICD-10-CM | POA: Diagnosis not present

## 2019-02-26 DIAGNOSIS — R296 Repeated falls: Secondary | ICD-10-CM | POA: Diagnosis present

## 2019-02-26 DIAGNOSIS — Z8673 Personal history of transient ischemic attack (TIA), and cerebral infarction without residual deficits: Secondary | ICD-10-CM

## 2019-02-26 DIAGNOSIS — I252 Old myocardial infarction: Secondary | ICD-10-CM

## 2019-02-26 DIAGNOSIS — Z23 Encounter for immunization: Secondary | ICD-10-CM

## 2019-02-26 DIAGNOSIS — Z8042 Family history of malignant neoplasm of prostate: Secondary | ICD-10-CM

## 2019-02-26 DIAGNOSIS — S61411D Laceration without foreign body of right hand, subsequent encounter: Secondary | ICD-10-CM | POA: Diagnosis not present

## 2019-02-26 DIAGNOSIS — S61219D Laceration without foreign body of unspecified finger without damage to nail, subsequent encounter: Secondary | ICD-10-CM | POA: Diagnosis not present

## 2019-02-26 DIAGNOSIS — N400 Enlarged prostate without lower urinary tract symptoms: Secondary | ICD-10-CM | POA: Diagnosis present

## 2019-02-26 DIAGNOSIS — S61012S Laceration without foreign body of left thumb without damage to nail, sequela: Secondary | ICD-10-CM | POA: Diagnosis not present

## 2019-02-26 DIAGNOSIS — Z743 Need for continuous supervision: Secondary | ICD-10-CM | POA: Diagnosis not present

## 2019-02-26 DIAGNOSIS — F1010 Alcohol abuse, uncomplicated: Secondary | ICD-10-CM | POA: Diagnosis not present

## 2019-02-26 DIAGNOSIS — Z833 Family history of diabetes mellitus: Secondary | ICD-10-CM

## 2019-02-26 DIAGNOSIS — R279 Unspecified lack of coordination: Secondary | ICD-10-CM | POA: Diagnosis not present

## 2019-02-26 DIAGNOSIS — Z8349 Family history of other endocrine, nutritional and metabolic diseases: Secondary | ICD-10-CM

## 2019-02-26 DIAGNOSIS — Z9119 Patient's noncompliance with other medical treatment and regimen: Secondary | ICD-10-CM

## 2019-02-26 DIAGNOSIS — Z79899 Other long term (current) drug therapy: Secondary | ICD-10-CM

## 2019-02-26 DIAGNOSIS — R2681 Unsteadiness on feet: Secondary | ICD-10-CM | POA: Diagnosis not present

## 2019-02-26 DIAGNOSIS — Z881 Allergy status to other antibiotic agents status: Secondary | ICD-10-CM

## 2019-02-26 DIAGNOSIS — Z79891 Long term (current) use of opiate analgesic: Secondary | ICD-10-CM

## 2019-02-26 DIAGNOSIS — K859 Acute pancreatitis without necrosis or infection, unspecified: Secondary | ICD-10-CM | POA: Diagnosis not present

## 2019-02-26 DIAGNOSIS — R41841 Cognitive communication deficit: Secondary | ICD-10-CM | POA: Diagnosis not present

## 2019-02-26 LAB — LIPID PANEL
Cholesterol: 136 mg/dL (ref 0–200)
HDL: 96 mg/dL (ref >40)
LDL Cholesterol: 26 mg/dL (ref 0–99)
Total CHOL/HDL Ratio: 1.4 RATIO
Triglycerides: 68 mg/dL (ref <150)
VLDL: 14 mg/dL (ref 0–40)

## 2019-02-26 LAB — CBC
HCT: 39 % (ref 39.0–52.0)
Hemoglobin: 12.2 g/dL — ABNORMAL LOW (ref 13.0–17.0)
MCH: 31.9 pg (ref 26.0–34.0)
MCHC: 31.3 g/dL (ref 30.0–36.0)
MCV: 101.8 fL — ABNORMAL HIGH (ref 80.0–100.0)
Platelets: 279 10*3/uL (ref 150–400)
RBC: 3.83 MIL/uL — ABNORMAL LOW (ref 4.22–5.81)
RDW: 12.5 % (ref 11.5–15.5)
WBC: 15 10*3/uL — ABNORMAL HIGH (ref 4.0–10.5)
nRBC: 0 % (ref 0.0–0.2)

## 2019-02-26 LAB — COMPREHENSIVE METABOLIC PANEL
ALT: 28 U/L (ref 0–44)
AST: 31 U/L (ref 15–41)
Albumin: 3.7 g/dL (ref 3.5–5.0)
Alkaline Phosphatase: 72 U/L (ref 38–126)
Anion gap: 15 (ref 5–15)
BUN: 8 mg/dL (ref 8–23)
CO2: 17 mmol/L — ABNORMAL LOW (ref 22–32)
Calcium: 8.7 mg/dL — ABNORMAL LOW (ref 8.9–10.3)
Chloride: 110 mmol/L (ref 98–111)
Creatinine, Ser: 0.78 mg/dL (ref 0.61–1.24)
GFR calc Af Amer: >60 mL/min (ref >60)
GFR calc non Af Amer: >60 mL/min (ref >60)
Glucose, Bld: 130 mg/dL — ABNORMAL HIGH (ref 70–99)
Potassium: 3.9 mmol/L (ref 3.5–5.1)
Sodium: 142 mmol/L (ref 135–145)
Total Bilirubin: 0.8 mg/dL (ref 0.3–1.2)
Total Protein: 7.3 g/dL (ref 6.5–8.1)

## 2019-02-26 LAB — MAGNESIUM: Magnesium: 1.5 mg/dL — ABNORMAL LOW (ref 1.7–2.4)

## 2019-02-26 LAB — HEMOGLOBIN A1C
Hgb A1c MFr Bld: 7.9 % — ABNORMAL HIGH (ref 4.8–5.6)
Mean Plasma Glucose: 180.03 mg/dL

## 2019-02-26 LAB — LIPASE, BLOOD: Lipase: 18 U/L (ref 11–51)

## 2019-02-26 LAB — ETHANOL: Alcohol, Ethyl (B): <10 mg/dL (ref <10)

## 2019-02-26 LAB — GLUCOSE, CAPILLARY: Glucose-Capillary: 134 mg/dL — ABNORMAL HIGH (ref 70–99)

## 2019-02-26 LAB — TSH: TSH: 1.427 u[IU]/mL (ref 0.350–4.500)

## 2019-02-26 LAB — PHOSPHORUS: Phosphorus: 2.8 mg/dL (ref 2.5–4.6)

## 2019-02-26 LAB — ECHOCARDIOGRAM COMPLETE

## 2019-02-26 LAB — TROPONIN I: Troponin I: <0.03 ng/mL (ref <0.03)

## 2019-02-26 MED ORDER — NICOTINE 7 MG/24HR TD PT24
7.0000 mg | MEDICATED_PATCH | Freq: Every day | TRANSDERMAL | Status: DC
  Administered 2019-02-26 – 2019-03-01 (×4): 7 mg via TRANSDERMAL
  Filled 2019-02-26 (×4): qty 1

## 2019-02-26 MED ORDER — AMLODIPINE BESYLATE 5 MG PO TABS
5.0000 mg | ORAL_TABLET | Freq: Every day | ORAL | Status: DC
  Administered 2019-02-26 – 2019-03-01 (×4): 5 mg via ORAL
  Filled 2019-02-26 (×4): qty 1

## 2019-02-26 MED ORDER — OXYCODONE HCL 5 MG PO TABS
2.5000 mg | ORAL_TABLET | ORAL | Status: DC | PRN
  Administered 2019-02-26 – 2019-02-28 (×10): 2.5 mg via ORAL
  Filled 2019-02-26 (×10): qty 1

## 2019-02-26 MED ORDER — ASPIRIN EC 81 MG PO TBEC
81.0000 mg | DELAYED_RELEASE_TABLET | Freq: Every day | ORAL | Status: DC
  Administered 2019-02-26 – 2019-03-01 (×4): 81 mg via ORAL
  Filled 2019-02-26 (×5): qty 1

## 2019-02-26 MED ORDER — ACETAMINOPHEN 650 MG RE SUPP: 650.0000 mg | Freq: Four times a day (QID) | RECTAL | Status: DC | PRN

## 2019-02-26 MED ORDER — PANCRELIPASE (LIP-PROT-AMYL) 12000-38000 UNITS PO CPEP
12000.0000 [IU] | ORAL_CAPSULE | Freq: Three times a day (TID) | ORAL | Status: DC
  Administered 2019-02-27 – 2019-03-01 (×8): 12000 [IU] via ORAL
  Filled 2019-02-26 (×8): qty 1

## 2019-02-26 MED ORDER — CEPHALEXIN 500 MG PO CAPS: 500.0000 mg | ORAL_CAPSULE | Freq: Four times a day (QID) | ORAL | 0 refills | Status: DC

## 2019-02-26 MED ORDER — SERTRALINE HCL 100 MG PO TABS
100.0000 mg | ORAL_TABLET | Freq: Every day | ORAL | Status: DC
  Administered 2019-02-26 – 2019-03-01 (×4): 100 mg via ORAL
  Filled 2019-02-26 (×4): qty 1

## 2019-02-26 MED ORDER — LORAZEPAM 2 MG/ML IJ SOLN: 1.0000 mg | Freq: Four times a day (QID) | INTRAMUSCULAR | Status: AC | PRN

## 2019-02-26 MED ORDER — ENOXAPARIN SODIUM 40 MG/0.4ML ~~LOC~~ SOLN
40.0000 mg | SUBCUTANEOUS | Status: DC
  Administered 2019-02-26 – 2019-02-28 (×3): 40 mg via SUBCUTANEOUS
  Filled 2019-02-26 (×3): qty 0.4

## 2019-02-26 MED ORDER — INSULIN ASPART 100 UNIT/ML ~~LOC~~ SOLN
0.0000 [IU] | Freq: Three times a day (TID) | SUBCUTANEOUS | Status: DC
  Administered 2019-02-27: 1 [IU] via SUBCUTANEOUS
  Administered 2019-02-27: 2 [IU] via SUBCUTANEOUS
  Administered 2019-02-27: 1 [IU] via SUBCUTANEOUS
  Administered 2019-02-28: 3 [IU] via SUBCUTANEOUS
  Administered 2019-02-28 (×2): 2 [IU] via SUBCUTANEOUS
  Administered 2019-03-01: 5 [IU] via SUBCUTANEOUS

## 2019-02-26 MED ORDER — SODIUM CHLORIDE 0.9% FLUSH
3.0000 mL | Freq: Two times a day (BID) | INTRAVENOUS | Status: DC
  Administered 2019-02-27 – 2019-03-01 (×4): 3 mL via INTRAVENOUS

## 2019-02-26 MED ORDER — ACETAMINOPHEN 325 MG PO TABS
650.0000 mg | ORAL_TABLET | Freq: Four times a day (QID) | ORAL | Status: DC | PRN
  Administered 2019-02-26 – 2019-03-01 (×3): 650 mg via ORAL
  Filled 2019-02-26 (×3): qty 2

## 2019-02-26 MED ORDER — VITAMIN B-1 100 MG PO TABS
100.0000 mg | ORAL_TABLET | Freq: Every day | ORAL | Status: DC
  Administered 2019-02-26 – 2019-03-01 (×4): 100 mg via ORAL
  Filled 2019-02-26 (×6): qty 1

## 2019-02-26 MED ORDER — LORAZEPAM 1 MG PO TABS
1.0000 mg | ORAL_TABLET | Freq: Four times a day (QID) | ORAL | Status: AC | PRN
  Administered 2019-02-26: 1 mg via ORAL
  Filled 2019-02-26: qty 1

## 2019-02-26 MED ORDER — TAMSULOSIN HCL 0.4 MG PO CAPS
0.4000 mg | ORAL_CAPSULE | Freq: Every day | ORAL | Status: DC
  Administered 2019-02-26 – 2019-03-01 (×4): 0.4 mg via ORAL
  Filled 2019-02-26 (×4): qty 1

## 2019-02-26 MED ORDER — FOLIC ACID 1 MG PO TABS
1.0000 mg | ORAL_TABLET | Freq: Every day | ORAL | Status: DC
  Administered 2019-02-26 – 2019-03-01 (×4): 1 mg via ORAL
  Filled 2019-02-26 (×6): qty 1

## 2019-02-26 MED ORDER — INSULIN ASPART 100 UNIT/ML ~~LOC~~ SOLN
0.0000 [IU] | Freq: Every day | SUBCUTANEOUS | Status: DC
  Administered 2019-02-28: 2 [IU] via SUBCUTANEOUS

## 2019-02-26 MED ORDER — THIAMINE HCL 100 MG/ML IJ SOLN: 100.0000 mg | Freq: Every day | INTRAMUSCULAR | Status: DC

## 2019-02-26 MED ORDER — MAGNESIUM SULFATE 2 GM/50ML IV SOLN
2.0000 g | Freq: Once | INTRAVENOUS | Status: AC
  Administered 2019-02-27: 2 g via INTRAVENOUS
  Filled 2019-02-26: qty 50

## 2019-02-26 MED ORDER — CARVEDILOL 3.125 MG PO TABS
3.1250 mg | ORAL_TABLET | Freq: Two times a day (BID) | ORAL | Status: DC
  Administered 2019-02-26 – 2019-03-01 (×6): 3.125 mg via ORAL
  Filled 2019-02-26 (×6): qty 1

## 2019-02-26 MED ORDER — CEPHALEXIN 500 MG PO CAPS
500.0000 mg | ORAL_CAPSULE | Freq: Four times a day (QID) | ORAL | Status: DC
  Administered 2019-02-26 – 2019-03-01 (×11): 500 mg via ORAL
  Filled 2019-02-26 (×11): qty 1

## 2019-02-26 MED ORDER — ADULT MULTIVITAMIN W/MINERALS CH
1.0000 | ORAL_TABLET | Freq: Every day | ORAL | Status: DC
  Administered 2019-02-26 – 2019-03-01 (×4): 1 via ORAL
  Filled 2019-02-26 (×4): qty 1

## 2019-02-26 MED ORDER — ATORVASTATIN CALCIUM 40 MG PO TABS
40.0000 mg | ORAL_TABLET | Freq: Every day | ORAL | Status: DC
  Administered 2019-02-26 – 2019-03-01 (×4): 40 mg via ORAL
  Filled 2019-02-26 (×4): qty 1

## 2019-02-26 MED ORDER — HYDROCODONE-ACETAMINOPHEN 5-325 MG PO TABS: 1.0000 | ORAL_TABLET | Freq: Four times a day (QID) | ORAL | 0 refills | Status: DC | PRN

## 2019-02-26 NOTE — Progress Notes (Signed)
Patient arrived without appointment.  Large  4 cm l;aceration in web space of left hand. [place] this morning and cut it on the door of a cabinet. Had another fall at home after that and then a third fall in our parking lot.Here with his daughter.  PROCEDURE NOTE: laceration repair left hand. Patient given informed consent ad signed copy in the chart. Area cleaned with soap and water. 10 cc of 2% lidocaine used to numb the area. Then area prepped and draped in usual sterile fashion. Copious irrigation of the large jagged laceration with sterile saline using a syringe for deep irrigation. Eleven interrupted sutures  Of 4-0 nylon used to close the wound. Hemostasis was obtained with sutures. Applied small smear of bactroban topical antibiotic, then covered with sterile pad, creating a bolster within the web space of the hand. This was then wrapped with kerlex and covered lightly with an ace bandage. Blood loss , 10 cc. Patient tolerated procedure well and there were no complications.  Patient instructed on 'red flags" such as increased severe pain, numbness in hand,  Excess drainage from wound, fever, red streaks of hand or arm. Also instructions to keep dry and elevate.    Given the large lceration with jagged edges, uncertain cleanliness of wound prior to arrival, multiple medical problkems including cachexia, I am concerned for wound infection. Plan 500 mg keflex tid for 5 days. Tetanus updated as well as I consider this a dirty wound. Will plan wound check 48 hours I clinic. After procedure was complete, we talked with his PCP, Dr. Ardelia Mems who discussed the issue of his falls with him. Dr. Ardelia Mems and the patient decided to be admitted for further evaluation of his multiple falls.  Laceration repair assistance by Dr. Tammi Klippel

## 2019-02-26 NOTE — H&P (Addendum)
[facility] Admission History and Physical Service Pager: [phone]  Patient name: Steven Frey. Medical record number: [id number] Date of birth: 03/16/1956 Age: 63 y.o. Gender: male  Primary Care Provider: Leeanne Rio, MD Consultants: None Code Status: Full, verified with patient on admission Preferred Emergency Contact: Father, Steven, Frey [phone]  Chief Complaint: Syncope  Assessment and Plan: Steven Frey. is a 63 y.o. male presenting with syncope as direct admission from [facility]. PMH is significant for HTN, Seizure Disorder, Depression, BPH, Hx CVA, Chronic Pancreatitis s/p pancreatic duct stenting, Alcoholic Cirrhosis, Hx Cardiac Arrest.  Syncope Multiple reported syncopal events in the last 2 months.  1 witness today in [facility].  No pre-syncope and patient denies confusion or somnolence afterwards.  States "I just wake up on the floor."  Does not report recent seizure like activity and reports non-compliance with Keppra, states "I sometimes take it."  Wells score 0, therefore unlikely PE.  Differential is broad given hx known seizures, cardiac arrest, CVAs, alcoholism.  Patient has significant history for V Fib cardiac arrest in 2011 and given this with lack of pre-syncope, cardiac origin is most likely etiology, therefore will consult cardiology.  Brainstem stroke (although very unlikely), uncontrolled seizures given lack of Keppra use, metabolic disturbance, orthostatic hypotension, arrhytmia also on differential.  Vitals stable on admission with only mildly hypertensive BP to 143/80.  Will admit for further syncopal workup.   - admit to telemtry, Steven Frey service - cardiac monitoring - continuous pulse ox - orthostatic vitals - CMP, Mg, Phos - CBC - A1c, TSH, Lipid  - Keppra Level, restart pending levels - CXR - non-contrast CT: no acute changes - cardiology consult - EEG - Echo - EKG, troponin - UDS - CIWA protocol - Lovenox  for DVT PPx - Heart Healthy Diet  Hand Laceration Occurred following fall yesterday evening.  Repaired in [facility] today.  Clean bandage in place on exam.  Keflex prescribed by Steven Frey for 5 days. Neurovascularly intact on exam. - cont Keflex 500mg  TID x 5 days - general wound care by nursing  Hypomagnesmia  Mag 1.5, K WNL.   - replete mag sulfate x1 - recheck Mag in AM  Leukocytosis WBC 15, afebrile. Could be 2/2 demargination from wound. -Continue to monitor CBC  Diabetes: New Diagnosis Hgb A1c 7.9 on admission.   - sSSI - CBGs AC/HC -DM education  HTN On Norvasc 5 mg at home, likely not taking.  Very low suspicion for CVA, therefore will continue home medication. BP 143/80 on admission. - cont norvasc  - orthostatic vitals  Seizure Disorder On Keppra 1000 milligrams BID, but non-compliant.  Patient's unsure of last known seizure.  Did note some "shaking" a few weeks ago for a few hrs resolved by an OTC "pain medicine" per his report, but unclear on the name.  Neuro exam on admission with decreased vision in left temporal visual field, decreased sensation in left lower extremity, contracture of right hand, which patient reports to be his baseline. - Keppra level - EEG - cont home keppra pending levels  Depression Takes Zoloft 100mg  QD, although unclear if is compliant.   - cont home zoloft  BPH On Flomax at home, but states that he does not take it all the time.  - cont flomax pending orthostatic vitals   Chronic Pancreatitis  Alcoholic Cirrhosis Takes Creon 12,000 units 3 times daily before meals (ran out and hasn't taken in months).   C/o of diarrhea and had  minor belly pain No evidence of acute pancreatitis on exam today. Last drink 2 days ago. - obtain CMP -check lipase - monitor on CIWA protocol  Hx V. Fib Cardiac Arrest June 2011, Chronic Atypical Chest Pain Occurred following pancreatic duct stenting in June 2019, during which he had anoxic brain injury.  Has  had life vest in the past and refused to continue wearing it.  Last seen by EP cardiologist Steven Frey 01/2018.  Low risk cardiac CT at that time, no indication for ICD as cardiac arrest is setting of malnutrition, ETOH, metabolic derangements.  Continues to endorse intermittent chest pain that occurs rarely and is unchanged from previous workups. On Coreg 3.125mg  BID at home, also unclear if compliant.   - cont home coreg - cardiology consult - EKG - cardiac monitoring  Hx CVA 2013, Wernicke's Encephalopathy, Hx Anoxic Brain Injury during Cardiac Arrest Hx Left parietal infarct on MRI with atrophy and small vessel disease in 2013.  CT head pending today.  On Lipitor 40mg , Coreg 3.125 BID, ASA 81mg  at home. - cont home lipitor, ASA, coreg - f/u CT head today  Tobacco abuse, THC use -Nicotine patch -UDS  FEN/GI: Heart Healthy Prophylaxis: Lovenox for DVT PPx  Disposition: admit to telemetry  History of Present Illness:  Steven Frey. is a 63 y.o. male presenting with syncope, as direct admission from [facility].  Patient seen in [facility] today, as he reported a syncopal episode last night, 5/18, when he cut his hand on a cabinet.  Patient came to [facility] today for his hand laceration.  Patient also noted to have syncopal episode in clinic, therefore after suturing of his hand laceration, patient was admitted directly to the hospital, vital signs stable in clinic.  He notes that he has been "blacking out "for the last month "once every 2 weeks."  He states that this usually occurs while he is standing still smoking a cigarette.  He denies chest pain, palpitations, shortness of breath, dizziness, vision changes prior to LOC.  Patient states "I just wake up on the ground."  Denies any confusion or somnolence immediately after awakening.  He states that his daughter has witnessed 1 of these episodes, but he has not been told what they look like.  Patient also notes that 2 weeks ago he had an episode of  "shaking" that was not like previous seizures which improved with some over-the-counter pain medication, which he is unable to recall the name of.  He does not recall any binge drinking around this time or possibility for withdrawal from alcohol.   He does mention some intermittent chest pain, sometimes sharp and sometimes "regular pain". Not connected with activity.  He has a history of atypical chest pain for which she has seen cardiology, the patient states that this is very similar to his previous chest pain.  He also reports a chronic occasional cough, smokes 2 to 3 cigarettes/day since age of 74.  He does insist that he drinks 4-5 beers per day, but does state that he sometimes drinks more.  He notes that his last drink was about 2 days ago.   Review Of Systems: Per HPI with the following additions:   Review of Systems  Constitutional: Positive for chills. Negative for fever.  Eyes: Negative for blurred vision.       Chronically has poor vision in left temporal visual field  Respiratory: Positive for cough. Negative for hemoptysis, sputum production and shortness of breath.   Cardiovascular: Positive for  chest pain (intermittent, chronic). Negative for palpitations and leg swelling.  Gastrointestinal: Positive for diarrhea. Negative for abdominal pain, blood in stool, constipation, nausea and vomiting.  Genitourinary:       Occasional hesitancy  Musculoskeletal: Positive for falls.  Neurological: Positive for sensory change (Chronic decreased sensation in left lower extremity), focal weakness and loss of consciousness. Negative for dizziness, speech change, seizures and headaches.  Psychiatric/Behavioral: Positive for substance abuse.       Marijuana, alcohol, tobacco AOx3    Patient Active Problem List   Diagnosis Date Noted  . Polyuria 03/14/2016  . Alcohol-induced chronic pancreatitis ([place]) 01/18/2016  . Pancreas divisum 01/18/2016  . 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 Cirrhosis of liver without ascites ([place])   . Abdominal pain   . Acute on chronic pancreatitis ([place])   . Pleural effusion   . Pancreatic ascites 11/12/2015  . Tachycardia 11/12/2015  . Spontaneous bacterial peritonitis ([place])   . Ascites   . Protein-calorie malnutrition, severe ([place]) 11/03/2015  . Pancreatitis, acute   . Pancreatitis 11/02/2015  . Hyperglycemia 03/05/2015  . Pain and swelling of right forearm 03/05/2015  . Diastolic dysfunction 25/00/3704  . Syncope and collapse 12/26/2014  . Constipation 10/26/2014  . Major depression, single episode 09/26/2014  . Preoperative cardiovascular examination 03/28/2013  . 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 Left inguinal hernia 10/30/2012  . Seizures ([place]) 09/03/2012  . History of CVA (cerebrovascular accident) 01/30/2012  . Sinus tachycardia 10/26/2011  . Pre-operative cardiovascular examination 10/26/2011  . 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 ARM PAIN, RIGHT 11/02/2010  . ANXIETY 10/08/2010  . LEG EDEMA 08/24/2010  . ABSCESS OF LIVER 08/13/2010  . Anoxic brain damage ([place]) 07/07/2010  . CARDIAC ARREST 05/24/2010  . VITAMIN B12 DEFICIENCY 03/25/2010  . Hyperlipidemia 03/15/2010  . Anemia 03/15/2010  . CIRRHOSIS, ALCOHOLIC 88/89/1694  . Chronic pancreatitis ([place]) 02/23/2010  . Chest pain 02/23/2010  . ERECTILE DYSFUNCTION 01/26/2009  . Alcohol abuse 01/26/2009  . VISUAL IMPAIRMENT 01/26/2009  . CIGARETTE SMOKER 03/10/2008  . HYPERTENSION, BENIGN 03/10/2008    Past Medical History: Past Medical History:  Diagnosis Date  . 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 Anemia   . Angioedema    rx requiring intubation/vent support suspected secondary to ACE1 (but also on zithromax) 5/09 hospitalization   . Anoxic brain damage (HCC)   . Arthritis   . Chronic pain 2011  . Chronic pancreatitis ([place])    (10/08 hops). admx 02/2010 pseudocyst aspirated during admxn. pancreatic dual stent placement at  [facility] 11/11  . Cirrhosis ([place]) 2011   due to ETOH with recent hepatic abcess and possible HCC.   Marland Kitchen COPD (chronic obstructive pulmonary disease) ([place])    DOE  . Depression with anxiety    no hx of meds  . Diabetes mellitus without complication ([place])    Type 2, pt reports no longer takes meds & A1C was normal.  . Edema   . ETOH abuse   . Gastritis    alcohol induced  . GERD (gastroesophageal reflux disease)   . History of stroke 2011   reports it was caused by pain from pancreatitis- caused heart to stop and stroke   . Hyperlipidemia 2011  . Hypertension 2011  . Inguinal hernia 2011  . Myocardial infarction ([place])   . Pneumonia    hx of  . Seizures ([place]) 2011   had seizure 11-24-14/went to ED per pt.   . Smoker   .  Stroke [facility])    right sided weakness; affected long term memory  . Syncope and collapse 12/26/2014  . 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 VF (ventricular fibrillation) ([place]) 2011   arrest 6/11. successfully rescucitated w/prolinged hospitalization due to respitatory failure. QT prolongation during cooling, now resolved    Past Surgical History: Past Surgical History:  Procedure Laterality Date  . [place]   right  . INGUINAL HERNIA REPAIR Left 07/12/2013   Procedure: HERNIA REPAIR INGUINAL ADULT;  Surgeon: Adin Hector, MD;  Location: [facility];  Service: General;  Laterality: Left;  . INSERTION OF MESH Left 07/12/2013   Procedure: INSERTION OF MESH;  Surgeon: Adin Hector, MD;  Location: [place];  Service: General;  Laterality: Left;  . pancreatic stent placement    . right wrist ORIF  1976   for fx    Social History: Social History   Tobacco Use  . Smoking status: Current Every Day Smoker    Packs/day: 0.25    Types: Cigarettes  . Smokeless tobacco: Never Used  Substance Use Topics  . Alcohol use: Yes    Alcohol/week: 0.0 standard drinks    Comment: hx of alcohol abuse, every 2-3 weeks beer  on and off   . Drug use: No    Comment: h/o THC, cocaine, heroin; no longer using since 2011   Additional social history: Reports drinking 4-5 beers a day, sometimes more, states that he only smokes THC and cigarettes Please also refer to relevant sections of EMR.  Family History: Family History  Problem Relation Age of Onset  . Alcohol abuse Mother   . Early death Mother   . Alcohol abuse Father   . Prostate cancer Father 73  . Alcohol abuse Other   . Diabetes Sister   . Hyperlipidemia Sister   . Hypertension Sister   . Breast cancer Paternal Grandmother   . Coronary artery disease Neg Hx   . Colon cancer Neg Hx     Allergies and Medications: Allergies  Allergen Reactions  . Ace Inhibitors Anaphylaxis and Hives    Other reaction(s): Other (See Comments) On MAR   . Azithromycin Anaphylaxis and Swelling    Throat swelling, body swelling  . Lisinopril Anaphylaxis, Hives and Swelling    REACTION: facial/neck edema   No current facility-administered medications on file prior to encounter.    Current Outpatient Medications on File Prior to Encounter  Medication Sig Dispense Refill  . acetaminophen (TYLENOL) 500 MG tablet Take 500 mg by mouth 2 (two) times daily as needed.    Marland Kitchen amLODipine (NORVASC) 5 MG tablet TAKE 1 TABLET BY MOUTH EVERY DAY 30 tablet 1  . ASPIRIN ADULT LOW STRENGTH 81 MG EC tablet TAKE 1 TABLET BY MOUTH EVERY DAY 90 tablet 3  . atorvastatin (LIPITOR) 40 MG tablet TAKE 1 TABLET BY MOUTH EVERY DAY 90 tablet 3  . carvedilol (COREG) 3.125 MG tablet TAKE 1 TABLET BY MOUTH TWICE A DAY WITH A MEAL 180 tablet 1  . cephALEXin (KEFLEX) 500 MG capsule Take 1 capsule (500 mg total) by mouth 4 (four) times daily for 5 days. 20 capsule 0  . [facility]S B-1 100 MG tablet TAKE 1 TABLET BY MOUTH EVERY DAY 100 tablet 3  . CVS B-12 1000 MCG TBCR TAKE 1 TABLET (1,000 MCG TOTAL) BY MOUTH DAILY. 90 tablet 0  . folic acid (FOLVITE) 1 MG tablet TAKE 1 TABLET BY MOUTH EVERY DAY 90  tablet 3   . HYDROcodone-acetaminophen (NORCO) 5-325 MG tablet Take 1 tablet by mouth every 6 (six) hours as needed for moderate pain. 5 tablet 0  . levETIRAcetam (KEPPRA) 1000 MG tablet TAKE 1 TABLET BY MOUTH TWICE A DAY 180 tablet 1  . lipase/protease/amylase (CREON) 12000 units CPEP capsule TAKE 1 CAPSULE BY MOUTH 3 TIMES A DAY BEFORE MEALS 270 capsule 3  . sertraline (ZOLOFT) 100 MG tablet TAKE 1 TABLET BY MOUTH EVERY DAY 90 tablet 0  . tamsulosin (FLOMAX) 0.4 MG CAPS capsule TAKE 1 CAPSULE BY MOUTH EVERY DAY 90 capsule 0  . thiamine 100 MG tablet Take 100 mg by mouth daily.      Objective: BP (!) 143/80 (BP Location: Left Arm)   Pulse 83   Temp 98.8 F (37.1 C) (Oral)   Resp 18   SpO2 98%   Physical Exam Constitutional:      General: He is not in acute distress. HENT:     Head: Normocephalic and atraumatic.     Mouth/Throat:     Mouth: Mucous membranes are moist.  Eyes:     General: No scleral icterus.    Pupils: Pupils are equal, round, and reactive to light.     Comments: Does not look to left temporally and reports chronic poor vision   Neck:     Musculoskeletal: Neck supple.  Cardiovascular:     Rate and Rhythm: Normal rate and regular rhythm.     Heart sounds: No murmur. No friction rub. No gallop.   Pulmonary:     Effort: Pulmonary effort is normal. No respiratory distress.     Breath sounds: Normal breath sounds. No wheezing, rhonchi or rales.  Abdominal:     General: There is no distension.     Tenderness: There is abdominal tenderness (diffusely). There is guarding (active). There is no rebound.  Musculoskeletal:        General: No tenderness.     Right lower leg: No edema.     Left lower leg: No edema.     Comments: Clean bandage on left hand, sensation intact in distal fingers  Skin:    General: Skin is warm and dry.     Coloration: Skin is not jaundiced.  Neurological:     Mental Status: He is alert and oriented to person, place, and time. Mental status is at  baseline.     Comments: Chronic right hand contracture, chronic decreased sensation LLE, 4/5 strength BLE  Psychiatric:        Mood and Affect: Mood normal.        Behavior: Behavior normal.     Labs and Imaging: CBC BMET  Recent Labs  Lab 02/26/19 1624  WBC 15.0*  HGB 12.2*  HCT 39.0  PLT 279   Recent Labs  Lab 02/26/19 1624  NA 142  K 3.9  CL 110  CO2 17*  BUN 8  CREATININE 0.78  GLUCOSE 130*  CALCIUM 8.7*     Ct Head Wo Contrast  Result Date: 02/26/2019 CLINICAL DATA:  Syncope EXAM: CT HEAD WITHOUT CONTRAST TECHNIQUE: Contiguous axial images were obtained from the base of the skull through the vertex without intravenous contrast. COMPARISON:  01/30/2012 FINDINGS: Brain: No evidence of acute infarction, hemorrhage, hydrocephalus, extra-axial collection or mass lesion/mass effect. There is left parietal encephalomalacia in keeping with prior infarction. Vascular: No hyperdense vessel or unexpected calcification. Skull: Normal. Negative for fracture or focal lesion. Sinuses/Orbits: No acute finding. Other: None. IMPRESSION: No acute  intracranial pathology. There is a left parietal encephalomalacia in keeping with prior infarction demonstrated on CT dated 01/30/2012. Electronically Signed   By: Eddie Candle M.D.   On: 02/26/2019 16:10   Meccariello, Bernita Raisin, DO 02/26/2019, 5:15 PM PGY-1, [facility] pager: [phone], text pages welcome   FPTS Upper-Level Resident Addendum   I have independently interviewed and examined the patient. I have discussed the above with the original author and agree with their documentation. My edits for correction/addition/clarification are in blue. Please see also any attending notes.    Sherene Sires, DO PGY-2, [facility] 02/26/2019 6:43 PM  FPTS Service pager: [phone] (text pages welcome through [facility])

## 2019-02-26 NOTE — Progress Notes (Signed)
EEG Complete  Results Pending 

## 2019-02-26 NOTE — Progress Notes (Signed)
   Subjective:       Assessment & Plan:     Caroline More, DO, PGY-2

## 2019-02-26 NOTE — Patient Instructions (Signed)
It was a pleasure seeing you today.   Today we discussed your hand. You have 11 stitches in that hand   For your hand: take keflex for 5 days. I have given you some pain meds as well. Do not take them with alcohol.   If you have fever, more redness, pus drainage or can't feel your fingers please come to clinic ASAP or go to the emergency room.   Please follow up in 2 days or sooner if symptoms persist or worsen. Please call the clinic immediately if you have any concerns.   Our clinic's number is [phone]. Please call with questions or concerns.    Thank you,  Caroline More, DO

## 2019-02-26 NOTE — Progress Notes (Signed)
Received patient and did assessment.  Upon doing assessment patient stated he had staples put in left hand but wrapped up in kerlix and bandages.

## 2019-02-26 NOTE — Progress Notes (Signed)
  Echocardiogram 2D Echocardiogram has been performed.  Randa Lynn Aayansh Codispoti 02/26/2019, 6:06 PM

## 2019-02-26 NOTE — Consult Note (Addendum)
Cardiology Consultation:   Patient ID: Steven Frey.; [id number]; 08/30/1956   Admit date: 02/26/2019 Date of Consult: 02/26/2019  Primary Care Provider: Leeanne Rio, MD Primary Cardiologist: No primary care provider on file.  Primary Electrophysiologist:  Thompson Grayer, MD 01/17/2018   Patient Profile:   Steven Frey. is a 63 y.o. male with a hx of HTN, Seizure Disorder, Depression, BPH, Hx CVA, Chronic Pancreatitis s/p pancreatic duct stenting, Alcoholic Cirrhosis, Hx Cardiac Arrest (in setting of ETOH, malnutrition and metabolic derangements) w/ nl cath 2011, CP w/ no obs dz on cardiac CT 08/2017, L iliac thrombus (incidental finding),  who is being seen today for the evaluation of syncope at the request of Dr Andria Frames.  History of Present Illness:   Steven Frey went as a walk-in to Dr Verlon Frey office today after 3 falls. L hand laceration required 11 sutures, started on Keflex and given Tetanus shot. Falls were from syncope, pt admitted and cards asked to see.   Steven Frey states that the syncope only occurs when he is walking.  He denies orthostatic dizziness or any lightheaded feelings.  He will get up and walk outside to smoke a cigarette and wake up on the ground.  He has not had palpitations.  He has had chest pain, states the chest pain is been going on for about 2 years.  The episodes will come and go.  They are not exertional.  He cannot think of any precipitating factors and has never taken any medicine to make them go away.  He cannot say how long they last.  He cannot describe them further.  He has chronic dyspnea on exertion.  His ambulation is limited by musculoskeletal limitations and by dyspnea on exertion.  He does not think he can walk 50 feet without stopping.  This is a chronic problem with no recent change.  He denies any wheezing.  There is no lower extremity edema.  He denies any recent illness.  He feels he has been eating and drinking normally.  He  lives with his father who does the cooking.  His brother comes by the house and goes to the store for them.  He and his father will go through a 15 pack of beer in a couple of days.   Past Medical History:  Diagnosis Date   Abscess of liver(572.0)    Anemia    Angioedema    rx requiring intubation/vent support suspected secondary to ACE1 (but also on zithromax) 5/09 hospitalization    Anoxic brain damage (HCC)    Arthritis    Chronic pain 2011   Chronic pancreatitis ([place])    (10/08 hops). admx 02/2010 pseudocyst aspirated during admxn. pancreatic dual stent placement at [facility] 11/11   Cirrhosis [facility]) 2011   due to ETOH with recent hepatic abcess and possible HCC.    COPD (chronic obstructive pulmonary disease) ([place])    DOE   Depression with anxiety    no hx of meds   Diabetes mellitus without complication ([place])    Type 2, pt reports no longer takes meds & A1C was normal.   Edema    ETOH abuse    Gastritis    alcohol induced   GERD (gastroesophageal reflux disease)    History of stroke 2011   reports it was caused by pain from pancreatitis- caused heart to stop and stroke    Hyperlipidemia 2011   Hypertension 2011   Inguinal hernia 2011   Myocardial infarction [facility])  Pneumonia    hx of   Seizures ([place]) 2011   had seizure 11-24-14/went to ED per pt.    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 successfully rescucitated w/prolinged hospitalization due to respitatory failure. QT prolongation during cooling, now resolved    Past Surgical History:  Procedure Laterality Date   CARDIAC CATHETERIZATION  2011   non-obs dz, post-cardiac arrest   INGUINAL HERNIA REPAIR  1960   right   INGUINAL HERNIA REPAIR Left 07/12/2013   Procedure: HERNIA REPAIR  INGUINAL ADULT;  Surgeon: Adin Hector, MD;  Location: [place];  Service: General;  Laterality: Left;   INSERTION OF MESH Left 07/12/2013   Procedure: INSERTION OF MESH;  Surgeon: Adin Hector, MD;  Location: [place];  Service: General;  Laterality: Left;   pancreatic stent placement     right wrist ORIF  1976   for fx     Prior to Admission medications   Medication Sig Start Date End Date Taking? Authorizing Provider  acetaminophen (TYLENOL) 500 MG tablet Take 500 mg by mouth 2 (two) times daily as needed.    [provider]  amLODipine (NORVASC) 5 MG tablet TAKE 1 TABLET BY MOUTH EVERY DAY 08/09/17   Leeanne Rio, MD  ASPIRIN ADULT LOW STRENGTH 81 MG EC tablet TAKE 1 TABLET BY MOUTH EVERY DAY 10/22/18   Leeanne Rio, MD  atorvastatin (LIPITOR) 40 MG tablet TAKE 1 TABLET BY MOUTH EVERY DAY 05/15/18   Leeanne Rio, MD  carvedilol (COREG) 3.125 MG tablet TAKE 1 TABLET BY MOUTH TWICE A DAY WITH A MEAL 10/22/18   Leeanne Rio, MD  cephALEXin (KEFLEX) 500 MG capsule Take 1 capsule (500 mg total) by mouth 4 (four) times daily for 5 days. 02/26/19 03/03/19  Caroline More, DO  [facility] B-1 100 MG tablet TAKE 1 TABLET BY MOUTH EVERY DAY 05/15/18   Leeanne Rio, MD  [facility] B-12 1000 MCG TBCR TAKE 1 TABLET (1,000 MCG TOTAL) BY MOUTH DAILY. 05/10/17   Leeanne Rio, MD  folic acid (FOLVITE) 1 MG tablet TAKE 1 TABLET BY MOUTH EVERY DAY 07/30/18   McDiarmid, Blane Ohara, MD  HYDROcodone-acetaminophen (NORCO) 5-325 MG tablet Take 1 tablet by mouth every 6 (six) hours as needed for moderate pain. 02/26/19   Caroline More, DO  levETIRAcetam (KEPPRA) 1000 MG tablet TAKE 1 TABLET BY MOUTH TWICE A DAY 10/22/18   Leeanne Rio, MD  lipase/protease/amylase (CREON) 12000 units CPEP capsule TAKE 1 CAPSULE BY MOUTH 3 TIMES A DAY BEFORE MEALS 05/15/18   Leeanne Rio, MD  sertraline (ZOLOFT) 100 MG tablet TAKE 1 TABLET BY MOUTH EVERY DAY 02/01/19   Leeanne Rio, MD  tamsulosin (FLOMAX) 0.4 MG CAPS capsule TAKE 1 CAPSULE BY MOUTH EVERY DAY 10/29/18   Leeanne Rio, MD  thiamine 100 MG tablet Take 100 mg by mouth daily. 11/25/18   [provider]    Inpatient Medications: Scheduled Meds:  enoxaparin (LOVENOX) injection  40 mg Subcutaneous H67R   folic acid  1 mg Oral Daily   insulin aspart  0-5 Units Subcutaneous QHS   [START ON 02/27/2019] insulin aspart  0-9 Units Subcutaneous TID WC   multivitamin with minerals  1 tablet Oral Daily  nicotine  7 mg Transdermal Daily   sodium chloride flush  3 mL Intravenous Q12H   thiamine  100 mg Oral Daily   Or   thiamine  100 mg Intravenous Daily   Continuous Infusions:  PRN Meds: acetaminophen **OR** acetaminophen, LORazepam **OR** LORazepam  Allergies:    Allergies  Allergen Reactions   Ace Inhibitors Anaphylaxis and Hives    Other reaction(s): Other (See Comments) On MAR    Azithromycin Anaphylaxis and Swelling    Throat swelling, body swelling   Lisinopril Anaphylaxis, Hives and Swelling    REACTION: facial/neck edema    Social History:   Social History   Socioeconomic History   Marital status: Divorced    Spouse name: Not on file   Number of children: Not on file   Years of education: Not on file   Highest education level: Not on file  Occupational History   Not on file  Social Needs   Financial resource strain: Not on file   Food insecurity:    Worry: Not on file    Inability: Not on file   Transportation needs:    Medical: Not on file    Non-medical: Not on file  Tobacco Use   Smoking status: Current Every Day Smoker    Packs/day: 0.25    Types: Cigarettes   Smokeless tobacco: Never Used  Substance and Sexual Activity   Alcohol use: Yes    Alcohol/week: 0.0 standard drinks    Comment: hx of alcohol abuse, every 2-3 weeks beer on and off    Drug use: No    Comment: h/o THC, cocaine, heroin; no longer using since 2011   Sexual  activity: Never  Lifestyle   Physical activity:    Days per week: Not on file    Minutes per session: Not on file   Stress: Not on file  Relationships   Social connections:    Talks on phone: Not on file    Gets together: Not on file    Attends religious service: Not on file    Active member of club or organization: Not on file    Attends meetings of clubs or organizations: Not on file    Relationship status: Not on file   Intimate partner violence:    Fear of current or ex partner: Not on file    Emotionally abused: Not on file    Physically abused: Not on file    Forced sexual activity: Not on file  Other Topics Concern   Not on file  Social History Narrative   Divorced, 2 children. 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 Rayann Heman is his cardiologist, last seen 03/2012; sees 1x/year    Dr. Krista Blue, [facility], is neurologist for seizures       Single, unemployed    Lives with his 43yo father   Current smoker since 1966    Family History:   Family History  Problem Relation Age of Onset   Alcohol abuse Mother    Early death Mother    Alcohol abuse Father    Prostate cancer Father 28   Alcohol abuse Other    Diabetes Sister    Hyperlipidemia Sister    Hypertension Sister    Breast cancer Paternal Grandmother    Coronary artery disease Neg Hx    Colon cancer Neg Hx  Family Status:  Family Status  Relation Name Status   Mother  Deceased at age 71   Father  Alive       HTN   Sister  Alive       HTN   Other aunt/uncle (Not Specified)   Sister  (Not Specified)   Sister  (Not Specified)   Sister  (Not Specified)   PGM  (Not Specified)   Neg Hx  (Not Specified)    ROS:  Please see the history of present illness.  All other ROS reviewed and negative.     Physical Exam/Data:   Vitals:   02/26/19 1411  BP: (!) 143/80  Pulse: 83   Resp: 18  Temp: 98.8 F (37.1 C)  TempSrc: Oral  SpO2: 98%   No intake or output data in the 24 hours ending 02/26/19 1717 There were no vitals filed for this visit. There is no height or weight on file to calculate BMI.  General:  Chronically ill-appearing, cachectic male, appears older than his stated age, in no acute distress HEENT: normal for age Lymph: no adenopathy Neck: JVP normal Endocrine:  No thryomegaly Vascular: No carotid bruits; 4/4 extremity pulses 2+, without bruits  Cardiac:  normal S1, S2; RRR; no murmur  Lungs:  Decreased BS bases bilaterally, no wheezing, rhonchi or rales  Abd: soft, tender, no hepatomegaly  Ext: no edema Musculoskeletal:  Chronic deformity R, BUE and BLE strength weak (R more than L)   Skin: warm and dry  Neuro:  CNs 2-12 intact, no focal abnormalities noted Psych:  Normal affect   EKG:  The EKG was personally reviewed and demonstrates:  Normal sinus rhythm 87 bpm, single PVC, within normal limits  Relevant CV Studies:  ECHO: 02/26/2019 IMPRESSIONS    1. The left ventricle has hyperdynamic systolic function, with an ejection fraction of >65%. The cavity size was normal. Left ventricular diastolic Doppler parameters are consistent with impaired relaxation. No evidence of left ventricular regional wall  motion abnormalities.  2. The right ventricle has normal systolic function. The cavity was normal. There is no increase in right ventricular wall thickness. Right ventricular systolic pressure could not be assessed.  3. The interatrial septum appears to be lipomatous.  4. The interatrial septum is aneurysmal.  5. There is right bowing of the interatrial septum, suggestive of elevated left atrial pressure.  ECHO: 12/31/2014 - Left ventricle: The cavity size was normal. Wall thickness was normal. The estimated ejection fraction was 55%. Wall motion was normal; there were no regional wall motion abnormalities. Doppler parameters are  consistent with abnormal left ventricular relaxation (grade 1 diastolic dysfunction). - Aortic valve: There was no stenosis. - Mitral valve: There was no significant regurgitation. - Right ventricle: The cavity size was normal. Systolic function was normal. - Atrial septum: Atrial septal aneurysm noted. - Pulmonary arteries: No complete TR doppler jet so unable to estimate PA systolic pressure. - Inferior vena cava: The vessel was normal in size. The respirophasic diameter changes were in the normal range (>= 50%), consistent with normal central venous pressure.  Impressions:  - Normal LV size and systolic function, EF 54%. Normal RV size and systolic function. No significant valvular abnormalities.  CATH: 2011 Non-obs dz  CARDIAC CT: 09/01/2017 FINDINGS: Non-cardiac: See separate report from [facility] Radiology. No significant findings on limited lung and soft tissue windows.  Calcium Score: Dense calcium seen in proximal LAD Aortic root normal 2.9 cm Dilated main pulmonary artery 3.0 cm  Coronary Arteries:  Right dominant with no anomalies Most of the PDA territory is supplied by a large wrap around LAD  LM:  Normal  LAD: Less than 50% calcified disease in proximal vessel. Less than 50% soft plaque in the mid LAD  D1: Normal  D2: NormaL  D3: Normal  D4 Normal  Circumflex: Normal there appears to be a small branch off the proximal circumflex that drains into the RA  OM1: Normal  RCA:  Normal does not supply the PDA/PLA  IMPRESSION: 1) Calcium score 286 which is 49 th percentile for age and sex  2) Less than 50% non obstructive calcified plaque in proximal LAD and less than 50% soft plaque in mid LAD  3) Dilated main pulmonary artery 3.0 cm  4) Appears to be a small vessel emerging from proximal circumflex that may drain into the RA. This should not cause a significant left to right shunt  Laboratory Data:  Chemistry Recent  Labs  Lab 02/26/19 1624  NA 142  K 3.9  CL 110  CO2 17*  GLUCOSE 130*  BUN 8  CREATININE 0.78  CALCIUM 8.7*  GFRNONAA >60  GFRAA >60  ANIONGAP 15    Lab Results  Component Value Date   ALT 28 02/26/2019   AST 31 02/26/2019   ALKPHOS 72 02/26/2019   BILITOT 0.8 02/26/2019   Hematology Recent Labs  Lab 02/26/19 1624  WBC 15.0*  RBC 3.83*  HGB 12.2*  HCT 39.0  MCV 101.8*  MCH 31.9  MCHC 31.3  RDW 12.5  PLT 279   Cardiac Enzymes Recent Labs  Lab 02/26/19 1624  TROPONINI <0.03   TSH:  Lab Results  Component Value Date   TSH 1.722 12/26/2014   Lipids: Lab Results  Component Value Date   CHOL 136 02/26/2019   HDL 96 02/26/2019   LDLCALC 26 02/26/2019   TRIG 68 02/26/2019   CHOLHDL 1.4 02/26/2019   HgbA1c: Lab Results  Component Value Date   HGBA1C 7.9 (H) 02/26/2019   Magnesium:  Magnesium  Date Value Ref Range Status  02/26/2019 1.5 (L) 1.7 - 2.4 mg/dL Final    Comment:    Performed at [facility], [place] [address], [place], [place] [address]     Radiology/Studies:  Ct Head Wo Contrast  Result Date: 02/26/2019 CLINICAL DATA:  Syncope EXAM: CT HEAD WITHOUT CONTRAST TECHNIQUE: Contiguous axial images were obtained from the base of the skull through the vertex without intravenous contrast. COMPARISON:  01/30/2012 FINDINGS: Brain: No evidence of acute infarction, hemorrhage, hydrocephalus, extra-axial collection or mass lesion/mass effect. There is left parietal encephalomalacia in keeping with prior infarction. Vascular: No hyperdense vessel or unexpected calcification. Skull: Normal. Negative for fracture or focal lesion. Sinuses/Orbits: No acute finding. Other: None. IMPRESSION: No acute intracranial pathology. There is a left parietal encephalomalacia in keeping with prior infarction demonstrated on CT dated 01/30/2012. Electronically Signed   By: Eddie Candle M.D.   On: 02/26/2019 16:10    Assessment and Plan:   Active Problems:   Syncope  and collapse -By his appearance, p.o. intake is poor. - Check orthostatic vital signs - Follow-up on echocardiogram results. - Magnesium is low, potassium 3.9, supplement both - ECG in putting the patient on telemetry are pending, follow-up on these -If no arrhythmia on telemetry and echo is normal, discuss with MD if outpatient monitor or loop recorder is indicated  Otherwise, per IM  For questions or updates, please contact [place] Please consult [email or web address] for contact  info under Cardiology/STEMI.   Signed, Rosaria Ferries, PA-C  02/26/2019 5:17 PM  Patient seen, examined. Available data reviewed. Agree with findings, assessment, and plan as outlined by Rosaria Ferries, PA-C. Exam findings above reflect the personal finding's of my exam today. This patient with poor health and nutritional status is admitted with frequent falls and questionable syncope. He has no evidence of conduction disease or structural heart disease. The patient has known, nonobstructive CAD by gated coronary CTA. He has atypical chest pain with no ST changes on EKG and normal cardiac biomarkers. I think his very heavy alcohol use, seizure disorder, previous stroke, and other comorbid conditions are much more likely causes of frequent falls and/or syncopal episodes than any specific cardiac abnormality. No further cardiac evaluation recommended at this time. Please call if any other concerns. thx  Sherren Mocha, M.D. 02/26/2019 10:42 PM

## 2019-02-27 ENCOUNTER — Encounter (HOSPITAL_COMMUNITY): Payer: Self-pay | Admitting: *Deleted

## 2019-02-27 DIAGNOSIS — G40909 Epilepsy, unspecified, not intractable, without status epilepticus: Secondary | ICD-10-CM

## 2019-02-27 DIAGNOSIS — E440 Moderate protein-calorie malnutrition: Secondary | ICD-10-CM

## 2019-02-27 DIAGNOSIS — F1010 Alcohol abuse, uncomplicated: Secondary | ICD-10-CM

## 2019-02-27 LAB — GLUCOSE, CAPILLARY
Glucose-Capillary: 149 mg/dL — ABNORMAL HIGH (ref 70–99)
Glucose-Capillary: 129 mg/dL — ABNORMAL HIGH (ref 70–99)
Glucose-Capillary: 165 mg/dL — ABNORMAL HIGH (ref 70–99)
Glucose-Capillary: 195 mg/dL — ABNORMAL HIGH (ref 70–99)

## 2019-02-27 LAB — CBC WITH DIFFERENTIAL/PLATELET
Abs Immature Granulocytes: 0.04 10*3/uL (ref 0.00–0.07)
Basophils Absolute: 0 10*3/uL (ref 0.0–0.1)
Basophils Relative: 0 %
Eosinophils Absolute: 0.1 10*3/uL (ref 0.0–0.5)
Eosinophils Relative: 1 %
HCT: 34.5 % — ABNORMAL LOW (ref 39.0–52.0)
Hemoglobin: 10.9 g/dL — ABNORMAL LOW (ref 13.0–17.0)
Immature Granulocytes: 0 %
Lymphocytes Relative: 13 %
Lymphs Abs: 1.4 10*3/uL (ref 0.7–4.0)
MCH: 31.9 pg (ref 26.0–34.0)
MCHC: 31.6 g/dL (ref 30.0–36.0)
MCV: 100.9 fL — ABNORMAL HIGH (ref 80.0–100.0)
Monocytes Absolute: 0.9 10*3/uL (ref 0.1–1.0)
Monocytes Relative: 8 %
Neutro Abs: 8.1 10*3/uL — ABNORMAL HIGH (ref 1.7–7.7)
Neutrophils Relative %: 78 %
Platelets: 241 10*3/uL (ref 150–400)
RBC: 3.42 MIL/uL — ABNORMAL LOW (ref 4.22–5.81)
RDW: 12.4 % (ref 11.5–15.5)
WBC: 10.5 10*3/uL (ref 4.0–10.5)
nRBC: 0.2 % (ref 0.0–0.2)

## 2019-02-27 LAB — COMPREHENSIVE METABOLIC PANEL
ALT: 23 U/L (ref 0–44)
AST: 24 U/L (ref 15–41)
Albumin: 3.2 g/dL — ABNORMAL LOW (ref 3.5–5.0)
Alkaline Phosphatase: 67 U/L (ref 38–126)
Anion gap: 10 (ref 5–15)
BUN: 9 mg/dL (ref 8–23)
CO2: 23 mmol/L (ref 22–32)
Calcium: 8.4 mg/dL — ABNORMAL LOW (ref 8.9–10.3)
Chloride: 106 mmol/L (ref 98–111)
Creatinine, Ser: 0.77 mg/dL (ref 0.61–1.24)
GFR calc Af Amer: >60 mL/min (ref >60)
GFR calc non Af Amer: >60 mL/min (ref >60)
Glucose, Bld: 173 mg/dL — ABNORMAL HIGH (ref 70–99)
Potassium: 3.4 mmol/L — ABNORMAL LOW (ref 3.5–5.1)
Sodium: 139 mmol/L (ref 135–145)
Total Bilirubin: 1.6 mg/dL — ABNORMAL HIGH (ref 0.3–1.2)
Total Protein: 6.2 g/dL — ABNORMAL LOW (ref 6.5–8.1)

## 2019-02-27 LAB — LEVETIRACETAM LEVEL: Levetiracetam Lvl: 11.8 ug/mL (ref 10.0–40.0)

## 2019-02-27 LAB — VITAMIN B12: Vitamin B-12: 1035 pg/mL — ABNORMAL HIGH (ref 180–914)

## 2019-02-27 LAB — HIV ANTIBODY (ROUTINE TESTING W REFLEX): HIV Screen 4th Generation wRfx: NONREACTIVE

## 2019-02-27 LAB — MAGNESIUM: Magnesium: 2.7 mg/dL — ABNORMAL HIGH (ref 1.7–2.4)

## 2019-02-27 MED ORDER — LEVETIRACETAM 500 MG PO TABS
500.0000 mg | ORAL_TABLET | Freq: Two times a day (BID) | ORAL | Status: DC
  Administered 2019-02-27 – 2019-03-01 (×5): 500 mg via ORAL
  Filled 2019-02-27 (×5): qty 1

## 2019-02-27 MED ORDER — LIVING WELL WITH DIABETES BOOK
Freq: Once | Status: AC
  Administered 2019-02-27: 12:00:00
  Filled 2019-02-27: qty 1

## 2019-02-27 MED ORDER — POTASSIUM CHLORIDE CRYS ER 20 MEQ PO TBCR
40.0000 meq | EXTENDED_RELEASE_TABLET | Freq: Two times a day (BID) | ORAL | Status: AC
  Administered 2019-02-27 (×2): 40 meq via ORAL
  Filled 2019-02-27 (×2): qty 2

## 2019-02-27 NOTE — Progress Notes (Signed)
Occupational Therapy Evaluation Patient Details Name: Steven Frey. MRN: [id number] DOB: 1956/02/24 Today's Date: 02/27/2019    History of Present Illness Steven Frey. is a 63 y.o. male presenting with syncope as direct admission from [facility]. PMH is significant for HTN, Seizure Disorder, Depression, BPH, Hx CVA, Chronic Pancreatitis s/p pancreatic duct stenting, Alcoholic Cirrhosis, Hx Cardiac Arrest.   Clinical Impression   PTA, pt was living at home with his father who he took care of, pt reports he was independent with ADL/IADL and modified independent with functional mobility at spc level. Pt currently requires modA for ADL and functional mobility. Upon initial standing pt had LOB falling backward requiring assistance and vc to safely progress to return to sitting on EOb. During visual assessment, pt had brief episode of inattention with an upward eye gaze, followed by a 'shake' and pt refocused visual gaze to pen. Pt unaware of moment. Session limited due to pt orthostatic with standing. Due to decline in current level of function, pt would benefit from acute OT to address established goals to facilitate safe D/C to venue listed below. At this time, recommend SNF follow-up. Will continue to follow acutely.  Orthostatic Blood Pressure:  Sitting: 140/77  Standing: 116/87  Return to sitting: 144/81     Follow Up Recommendations  SNF;Supervision/Assistance - 24 hour    Equipment Recommendations  3 in 1 bedside commode    Recommendations for Other Services       Precautions / Restrictions Precautions Precautions: Fall Restrictions Weight Bearing Restrictions: No      Mobility Bed Mobility Overal bed mobility: Modified Independent                Transfers Overall transfer level: Needs assistance Equipment used: 1 person hand held assist Transfers: Sit to/from Stand Sit to Stand: Mod assist         General transfer comment: modAfor stability due to  several posterior LOB back onto bed, required vc to sit as opposed to falling straight back.     Balance Overall balance assessment: History of Falls                                         ADL either performed or assessed with clinical judgement   ADL Overall ADL's : Needs assistance/impaired Eating/Feeding: Set up;Sitting   Grooming: Sitting;Set up   Upper Body Bathing: Set up;Sitting   Lower Body Bathing: Minimal assistance   Upper Body Dressing : Minimal assistance Upper Body Dressing Details (indicate cue type and reason): difficulty with fine motor Lower Body Dressing: Moderate assistance Lower Body Dressing Details (indicate cue type and reason): requires assistance to don socks and shoes;modA for sit<>stand  Toilet Transfer: Moderate assistance;Stand-pivot Toilet Transfer Details (indicate cue type and reason): instability with standing         Functional mobility during ADLs: Minimal assistance;Moderate assistance;Rolling walker General ADL Comments: pt limited with ADL this session due to orthostatics;     Vision Baseline Vision/History: Wears glasses Wears Glasses: At all times Patient Visual Report: No change from baseline Vision Assessment?: Yes Ocular Range of Motion: Restricted on the left Tracking/Visual Pursuits: Decreased smoothness of horizontal tracking;Decreased smoothness of eye movement to RIGHT superior field;Unable to hold eye position out of midline Saccades: Decreased speed of saccadic movement;Undershoots Visual Fields: No apparent deficits Additional Comments: 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;reports he can see  OT, my glasses, mask, and headband;pt reports he is unable to see the TV mounted on the wall     Perception     Praxis      Pertinent Vitals/Pain Pain Assessment: Faces Faces Pain Scale: Hurts little more Pain Location: L HAND Pain Descriptors / Indicators: Aching Pain Intervention(s): Monitored during session      Hand Dominance Right   Extremity/Trunk Assessment Upper Extremity Assessment Upper Extremity Assessment: RUE deficits/detail;LUE deficits/detail RUE Deficits / Details: 5th digit flexed contracture;4th digit swan neck;wrist in flexed contracture;no active movement from elbow to hand;shoulder flexion about 45degrees;no functional use of right hand RUE Sensation: WNL RUE Coordination: decreased fine motor;decreased gross motor LUE Deficits / Details: L hand laceration;limited extension of digits;limited flexion of digits, able to make an open fist, unable to make closed fist;uses L hand to compensate for contracture in dominant hand;shoulder ROM about 100degrees;increased pain with movement  LUE Sensation: WNL LUE Coordination: decreased fine motor;decreased gross motor   Lower Extremity Assessment Lower Extremity Assessment: Defer to PT evaluation       Communication Communication Communication: No difficulties   Cognition Arousal/Alertness: Awake/alert Behavior During Therapy: WFL for tasks assessed/performed Overall Cognitive Status: History of cognitive impairments - at baseline                                 General Comments: hx of WE, was AOx4 today, following all cues and commands.    General Comments  pt had multiple full body 'twitches' during session, appeared twitching occurred when OT made contact with pt appearing like he was "startled" pt aware of these moments, unable to verbalize reason for occurring.      Exercises     Shoulder Instructions      Home Living Family/patient expects to be discharged to:: Private residence Living Arrangements: Parent Available Help at Discharge: Available 24 hours/day Type of Home: Mobile home Home Access: Stairs to enter [employer] of Steps: 3 Entrance Stairs-Rails: Can reach both Home Layout: One level     Bathroom Shower/Tub: Teacher, early years/pre: Standard     Home  Equipment: Cane - single point          Prior Functioning/Environment Level of Independence: Independent with assistive device(s)        Comments: reports indepdnence         OT Problem List: Decreased strength;Decreased range of motion;Decreased activity tolerance;Impaired balance (sitting and/or standing);Impaired vision/perception;Decreased safety awareness;Decreased knowledge of use of DME or AE;Decreased knowledge of precautions;Cardiopulmonary status limiting activity;Pain;Impaired UE functional use;Impaired tone      OT Treatment/Interventions: Self-care/ADL training;Therapeutic exercise;Energy conservation;DME and/or AE instruction;Therapeutic activities;Patient/family education;Balance training;Visual/perceptual remediation/compensation;Cognitive remediation/compensation;Splinting    OT Goals(Current goals can be found in the care plan section) Acute Rehab OT Goals Patient Stated Goal: to go home OT Goal Formulation: With patient Time For Goal Achievement: 03/13/19 Potential to Achieve Goals: Good ADL Goals Pt Will Perform Grooming: with modified independence;standing Pt Will Perform Lower Body Dressing: with modified independence;sit to/from stand Pt Will Transfer to Toilet: with modified independence;ambulating Additional ADL Goal #1: Pt demonstrate 3 strategies to decrease risk of falling.  OT Frequency: Min 2X/week   Barriers to D/C: Decreased caregiver support  pt lives with his father whom he takes care of        Co-evaluation              AM-PAC OT "6 Clicks" Daily  Activity     Outcome Measure Help from another person eating meals?: A Little Help from another person taking care of personal grooming?: A Little Help from another person toileting, which includes using toliet, bedpan, or urinal?: A Lot Help from another person bathing (including washing, rinsing, drying)?: A Lot Help from another person to put on and taking off regular upper body  clothing?: A Little Help from another person to put on and taking off regular lower body clothing?: A Lot 6 Click Score: 15   End of Session Equipment Utilized During Treatment: Gait belt;Rolling walker Nurse Communication: Mobility status;Other (comment)('twitching' and orthostatics)  Activity Tolerance: Treatment limited secondary to medical complications (Comment)(orthostatics) Patient left: in bed;with call bell/phone within reach;with bed alarm set  OT Visit Diagnosis: Unsteadiness on feet (R26.81);Other abnormalities of gait and mobility (R26.89);Repeated falls (R29.6);History of falling (Z91.81);Muscle weakness (generalized) (M62.81);Low vision, both eyes (H54.2);Pain Pain - Right/Left: Left Pain - part of body: Hand                Time: 1339-1400 OT Time Calculation (min): 21 min Charges:  OT General Charges $OT Visit: 1 Visit OT Evaluation $OT Eval Moderate Complexity: [place] OTR/L Acute Rehabilitation Services Office: [place] 02/27/2019, 3:24 PM

## 2019-02-27 NOTE — Discharge Summary (Signed)
[facility] Discharge Summary  Patient name: Steven Frey. Medical record number: [id number] Date of birth: [birth year] Age: 63 y.o. Gender: male Date of Admission: 02/26/2019  Date of Discharge: 03/01/2019 Admitting Physician: Steven Resides, MD  Primary Care Provider: Leeanne Rio, MD Consultants: Cardiology  Indication for Hospitalization: syncope  Discharge Diagnoses/Problem List:  Syncope Hand laceration Macrocytic anemia Diabetes Hypertension Seizure disorder Depression BPH Chronic pancreatitis, alcoholic cirrhosis, EtOHuse History of V. fib cardiac arrest June 2011, chronic atypical chest pain History of CVA 2013, Warnicke's encephalopathy, history of anoxic brain injury during cardiac arrest Tobacco abuse THC use  Disposition: SNF  Discharge Condition: stable  Discharge Exam:   Physical Exam:  General: 63 y.o. male in NAD Cardio: RRR no m/r/g Lungs: CTAB, no wheezing, no rhonchi, no crackles, no IWOB on RA Abdomen: Soft, non-tender to palpation, non-distended, positive bowel sounds Skin: warm and dry Extremities: No edema, chronic left hand contracture, right hand laceration between first and second digit without surrounding erythema, sutures in place, see below       Brief Hospital Course:  Steven Frey. is a 63 y.o. male presenting with syncope as direct admission from [facility]. PMH is significant for HTN, Seizure Disorder, Depression, BPH, Hx CVA, Chronic Pancreatitis s/p pancreatic duct stenting, Alcoholic Cirrhosis, Hx Cardiac Arrest.  His hospital course as outlined below.  Syncope Patient admitted to the hospital directly from [facility].  Had reported multiple syncopal episodes in the last 2 months, and had one witnessed in the [facility] on the day of admission.  Initial troponin negative, EKG sinus rhythm with PAC.  Chest x-ray suggestive of COPD.  Ethanol level is negative.  Initial electrolytes within normal limits aside  from mild hypomagnesemia, that was corrected with repletion.  TSH was within normal limits.  CT head was without any acute changes and showed old left parietal infarct.  Cardiology was consulted given patient's history of V. fib cardiac arrest, he did not believe that this was caused by cardiac abnormality.  Patient also had echo with EF greater than 65%, impaired relaxation, aneurysmal intra-atrial septum, elevated LA pressure.  Patient also had orthostatic vital signs while inpatient that were negative.  Etiology unclear, although patient is very noncompliant with Keppra at home.  Keppra level was 11.4, barely therapeutic.  EEG in hospital was within normal limits, patient did not have any further episodes while inpatient.  Patient should continue to follow-up with cardiology and neurology as outpatient.  At the time of discharge, patient had been without syncopal event since admission and vital signs were stable.  Hand laceration During syncopal episode on 5/18, patient cut open hand.  This was repaired with sutures on 5/19 at [facility].  He was placed on a 5-day course of Keflex and should have sutures removed 10 to 14 days after placement.  During his hospitalization, the laceration appeared to be clean, and healing well without surrounding erythema or discharge.  Diabetes Patient found to have A1c of 7.9 on admission.  Likely caused by pancreatic burn out due to chronic pancreatitis given malnutrition and thin body habitus.  Given patient's comorbidities, would recommend that goal A1c be less than 8 for this patient.  Given his history of chronic pancreatitis and need for pancreatic enzymes chronically, it is likely that his pancreas is burned out.  He was discharged home on metformin 500mg  QD.  Issues for Follow Up:  1. Patient has had 50 lb weight loss in last year.  Would recommend  outpatient GI consult and colonoscopy, PSA, CT chest. 2. Should follow up with Neuro for seizure disorder.  Referral placed  on discharge. 3. Should follow up with cardiology given Echo results and cardiac history. 4. Patient now with newly diagnosed diabetes.  He was discharged on metformin 500mg  Daily.  Would operate with goal of A1c less than 8 for this patient given his numerous comorbidities.  Diabetes education provided to patient while inpatient. 5. Recommend checking Hep C at follow up appointment, as bili elevated on admission. 6. Sutures need removed on 5/29.  Dressing changed prior to discharge and should be changed QD-QOD.  Significant Procedures:  None  Significant Labs and Imaging:  Recent Labs  Lab 02/26/19 1624 02/27/19 0303 02/27/19 1238 02/28/19 0320  WBC 15.0* 10.5  --  8.1  HGB 12.2* 10.9*  --  10.9*  HCT 39.0 34.5* 34.5* 34.8*  PLT 279 241  --  235   Recent Labs  Lab 02/26/19 1624 02/27/19 0303 02/28/19 0320  NA 142 139 137  K 3.9 3.4* 4.1  CL 110 106 107  CO2 17* 23 22  GLUCOSE 130* 173* 152*  BUN 8 9 9   CREATININE 0.78 0.77 0.83  CALCIUM 8.7* 8.4* 8.5*  MG 1.5* 2.7*  --   PHOS 2.8  --   --   ALKPHOS 72 67 66  AST 31 24 20   ALT 28 23 19   ALBUMIN 3.7 3.2* 3.0*   Echo 5/19  1. The left ventricle has hyperdynamic systolic function, with an ejection fraction of >65%. The cavity size was normal. Left ventricular diastolic Doppler parameters are consistent with impaired relaxation. No evidence of left ventricular regional wall  motion abnormalities.  2. The right ventricle has normal systolic function. The cavity was normal. There is no increase in right ventricular wall thickness. Right ventricular systolic pressure could not be assessed.  3. The interatrial septum appears to be lipomatous.  4. The interatrial septum is aneurysmal.  5. There is right bowing of the interatrial septum, suggestive of elevated left atrial pressure.  EEG This is a normal awake electroencephalogram with activation procedures. There are no focal lateralizing or epileptiform features.  X-ray  Chest Pa And Lateral  Result Date: 02/26/2019 CLINICAL DATA:  Pain status post fall EXAM: CHEST - 2 VIEW COMPARISON:  Chest x-ray dated 11/13/2015 FINDINGS: The cardiac silhouette is unremarkable. There is no pneumothorax. No large pleural effusion. There is mild blunting of the left costophrenic angle. The lungs are hyperexpanded which can be seen in patients with COPD. 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 IMPRESSION: 1. No acute cardiopulmonary process. 2. COPD. Electronically Signed   By: Constance Holster M.D.   On: 02/26/2019 17:30   Ct Head Wo Contrast  Result Date: 02/26/2019 CLINICAL DATA:  Syncope EXAM: CT HEAD WITHOUT CONTRAST TECHNIQUE: Contiguous axial images were obtained from the base of the skull through the vertex without intravenous contrast. COMPARISON:  01/30/2012 FINDINGS: Brain: No evidence of acute infarction, hemorrhage, hydrocephalus, extra-axial collection or mass lesion/mass effect. There is left parietal encephalomalacia in keeping with prior infarction. Vascular: No hyperdense vessel or unexpected calcification. Skull: Normal. Negative for fracture or focal lesion. Sinuses/Orbits: No acute finding. Other: None. IMPRESSION: No acute intracranial pathology. There is a left parietal encephalomalacia in keeping with prior infarction demonstrated on CT dated 01/30/2012. Electronically Signed   By: Eddie Candle M.D.   On: 02/26/2019 16:10   Results/Tests Pending at Time  of Discharge: none  Discharge Medications:  Allergies as of 03/01/2019      Reactions   Ace Inhibitors Anaphylaxis, Hives   Other reaction(s): Other (See Comments) On MAR   Azithromycin Anaphylaxis, Swelling   Throat swelling, body swelling   Lisinopril Anaphylaxis, Hives, Swelling   REACTION: facial/neck edema      Medication List    STOP taking these medications   HYDROcodone-acetaminophen 5-325 MG tablet Commonly known as:  Norco     TAKE these  medications   acetaminophen 500 MG tablet Commonly known as:  TYLENOL Take 500 mg by mouth 2 (two) times daily as needed for mild pain or headache.   amLODipine 5 MG tablet Commonly known as:  NORVASC TAKE 1 TABLET BY MOUTH EVERY DAY   Aspirin Adult Low Strength 81 MG EC tablet Generic drug:  aspirin TAKE 1 TABLET BY MOUTH EVERY DAY What changed:  how much to take   atorvastatin 40 MG tablet Commonly known as:  LIPITOR TAKE 1 TABLET BY MOUTH EVERY DAY What changed:  when to take this   carvedilol 3.125 MG tablet Commonly known as:  COREG TAKE 1 TABLET BY MOUTH TWICE A DAY WITH A MEAL What changed:  See the new instructions.   cephALEXin 500 MG capsule Commonly known as:  KEFLEX Take 1 capsule (500 mg total) by mouth 4 (four) times daily for 3 days.   [employer] B-1 100 MG tablet Generic drug:  thiamine TAKE 1 TABLET BY MOUTH EVERY DAY What changed:  how much to take   thiamine 100 MG tablet Take 100 mg by mouth daily. What changed:  Another medication with the same name was changed. Make sure you understand how and when to take each.   [employer] B-12 1000 MCG Tbcr Generic drug:  Cyanocobalamin TAKE 1 TABLET (1,000 MCG TOTAL) BY MOUTH DAILY.   folic acid 1 MG tablet Commonly known as:  FOLVITE TAKE 1 TABLET BY MOUTH EVERY DAY   levETIRAcetam 500 MG tablet Commonly known as:  KEPPRA Take 1 tablet (500 mg total) by mouth 2 (two) times daily. What changed:    medication strength  how much to take   lipase/protease/amylase 12000 units Cpep capsule Commonly known as:  Creon TAKE 1 CAPSULE BY MOUTH 3 TIMES A DAY BEFORE MEALS What changed:    how much to take  how to take this  when to take this  additional instructions   metFORMIN 500 MG tablet Commonly known as:  GLUCOPHAGE Take 1 tablet (500 mg total) by mouth daily with breakfast.   nicotine 7 mg/24hr patch Commonly known as:  NICODERM CQ - dosed in mg/24 hr Place 1 patch (7 mg total) onto the skin daily.    sertraline 100 MG tablet Commonly known as:  ZOLOFT TAKE 1 TABLET BY MOUTH EVERY DAY   tamsulosin 0.4 MG Caps capsule Commonly known as:  FLOMAX TAKE 1 CAPSULE BY MOUTH EVERY DAY       Discharge Instructions: Please refer to Patient Instructions section of EMR for full details.  Patient was counseled important signs and symptoms that should prompt return to medical care, changes in medications, dietary instructions, activity restrictions, and follow up appointments.   Follow-Up Appointments: Follow-up Information    Mullis, Kiersten P, DO. Go on 03/08/2019.   Specialty:  Family Medicine Why:  at 2:15 pm Contact information: [address] [place] [address] [phone]           Sandi Carne, Bernita Raisin, DO  03/01/2019, 9:31 AM PGY-1, [place]

## 2019-02-27 NOTE — Evaluation (Signed)
Physical Therapy Evaluation Patient Details Name: Steven Frey. MRN: [id number] DOB: 22-May-1956 Today's Date: 02/27/2019   History of Present Illness  Montey Ebel. is a 63 y.o. male presenting with syncope as direct admission from [facility]. PMH is significant for HTN, Seizure Disorder, Depression, BPH, Hx CVA, Chronic Pancreatitis s/p pancreatic duct stenting, Alcoholic Cirrhosis, Hx Cardiac Arrest.    Clinical Impression  Pt admitted with above diagnosis. Pt currently with functional limitations due to the deficits listed below (see PT Problem List). PTA, pt living at home with father, reports mod I with [facility] for ambulation. Today found saturated in urine pt AOx4, ambulating with ataxic gait in room with several LOB noted. Pt reports he feels more unsteady than baseline. Will trial AD next visit and update recs if needed. Pt wishes to return home will need to progress safety.  Pt will benefit from skilled PT to increase their independence and safety with mobility to allow discharge to the venue listed below.       BP: Supine 144/ 90 Seated 144/98 Standing 140/78 3 minute standing 144/80  HR 98    Follow Up Recommendations SNF;Supervision/Assistance - 24 hour(HHPT with progress,)    Equipment Recommendations  (TBD)    Recommendations for Other Services       Precautions / Restrictions Precautions Precautions: Fall Restrictions Weight Bearing Restrictions: No      Mobility  Bed Mobility Overal bed mobility: Modified Independent                Transfers Overall transfer level: Needs assistance Equipment used: 1 person hand held assist Transfers: Sit to/from Stand Sit to Stand: Min assist         General transfer comment: min A for stability due to several posterior LOB back onto bed.   Ambulation/Gait Ambulation/Gait assistance: Min assist Gait Distance (Feet): 15 Feet Assistive device: 1 person hand held assist Gait Pattern/deviations: Step-to  pattern Gait velocity: decraesed   General Gait Details: patient unsteady on his feet with ataxia, pt reports that is more unsteady today than PTA. need to trial [facility] but may be challanging with L hand wound.   Stairs            Wheelchair Mobility    Modified Rankin (Stroke Patients Only)       Balance Overall balance assessment: History of Falls                                           Pertinent Vitals/Pain Pain Assessment: Faces Faces Pain Scale: Hurts little more Pain Location: L HAND Pain Descriptors / Indicators: Aching Pain Intervention(s): Limited activity within patient's tolerance    Home Living Family/patient expects to be discharged to:: Private residence Living Arrangements: Parent Available Help at Discharge: Available 24 hours/day Type of Home: Mobile home Home Access: Stairs to enter Entrance Stairs-Rails: Can reach both Entrance Stairs-Number of Steps: 3 Home Layout: One level Home Equipment: Cane - single point      Prior Function Level of Independence: Independent with assistive device(s)         Comments: reports indepdnence      Hand Dominance        Extremity/Trunk Assessment   Upper Extremity Assessment Upper Extremity Assessment: Defer to OT evaluation    Lower Extremity Assessment Lower Extremity Assessment: RLE deficits/detail RLE Deficits / Details: RLE 3/5 strength, LLE 4/5  Communication      Cognition Arousal/Alertness: Awake/alert Behavior During Therapy: WFL for tasks assessed/performed Overall Cognitive Status: History of cognitive impairments - at baseline                                 General Comments: hx of WE, was AOx4 today, following all cues and commands.       General Comments      Exercises     Assessment/Plan    PT Assessment Patient needs continued PT services  PT Problem List Decreased strength       PT Treatment Interventions DME  instruction;Gait training;Stair training;Functional mobility training;Therapeutic activities;Therapeutic exercise;Balance training    PT Goals (Current goals can be found in the Care Plan section)  Acute Rehab PT Goals Patient Stated Goal: to go home PT Goal Formulation: With patient Time For Goal Achievement: 03/13/19 Potential to Achieve Goals: Fair    Frequency Min 3X/week   Barriers to discharge        Co-evaluation               AM-PAC PT "6 Clicks" Mobility  Outcome Measure Help needed turning from your back to your side while in a flat bed without using bedrails?: A Little Help needed moving from lying on your back to sitting on the side of a flat bed without using bedrails?: A Little Help needed moving to and from a bed to a chair (including a wheelchair)?: A Little Help needed standing up from a chair using your arms (e.g., wheelchair or bedside chair)?: A Lot Help needed to walk in hospital room?: A Lot Help needed climbing 3-5 steps with a railing? : A Lot 6 Click Score: 15    End of Session Equipment Utilized During Treatment: Gait belt Activity Tolerance: Patient tolerated treatment well Patient left: in chair;with call bell/phone within reach;with chair alarm set Nurse Communication: Mobility status PT Visit Diagnosis: Unsteadiness on feet (R26.81)    Time: 1000-1025 PT Time Calculation (min) (ACUTE ONLY): 25 min   Charges:   PT Evaluation $PT Eval Moderate Complexity: 1 Mod PT Treatments $Gait Training: 8-22 mins        Reinaldo Berber, PT, DPT Acute Rehabilitation Services Pager: [phone] Office: [place] 02/27/2019, 10:46 AM

## 2019-02-27 NOTE — Progress Notes (Signed)
Inpatient Diabetes Program Recommendations  AACE/ADA: New Consensus Statement on Inpatient Glycemic Control (2015)  Target Ranges:  Prepandial:   less than 140 mg/dL      Peak postprandial:   less than 180 mg/dL (1-2 hours)      Critically ill patients:  140 - 180 mg/dL   Lab Results  Component Value Date   GLUCAP 129 (H) 02/27/2019   HGBA1C 7.9 (H) 02/26/2019    Review of Glycemic Control Results for Steven Frey, Steven Frey (MRN [id number]) as of 02/27/2019 15:09  Ref. Range 02/26/2019 22:55 02/27/2019 06:53 02/27/2019 11:28  Glucose-Capillary Latest Ref Range: 70 - 99 mg/dL 134 (H) 149 (H) 129 (H)   Diabetes history: DM- new dx. (A1C=7.9%) Outpatient Diabetes medications: None Current orders for Inpatient glycemic control:  Novolog sensitive tid with meals and HS  Inpatient Diabetes Program Recommendations:    Referral received regarding new dx. Of DM.  A1C=7.9%.  Visited with patient to discuss.  Discussed elevated A1C however pt. Did not seem interested.  Patient is very thin (BMI=16.5) and therefore do not want him to limit calories however did encourage nutrient rich foods.  Also encouraged him to avoid sugar/sweet beverages.  Consider avoiding Metformin due to hx. Of ETOH use/cirrhosis.  Blood sugars have been ok here in the hospital.  If he d/c's to [facility], May consider just monitoring blood sugars to assess for possible medication needs. Patient did not seem engaged and "growled" when I said diabetes.  Attempted to encourage him to live healthy lifestyle.   Thanks Adah Perl, RN, BC-ADM Inpatient Diabetes Coordinator Pager [phone] (8a-5p)

## 2019-02-27 NOTE — Progress Notes (Addendum)
Family Medicine Teaching Service Daily Progress Note Intern Pager: [phone]  Patient name: Steven Frey. Medical record number: [id number] Date of birth: December 12, 1955 Age: 63 y.o. Gender: male  Primary Care Provider: Leeanne Rio, MD Consultants: Cardiology Code Status: Full  Pt Overview and Major Events to Date:  5/19 direct admit to FPTS  Assessment and Plan: Steven Frey. is a 63 y.o. male presenting with syncope as direct admission from [facility] PMH is significant for HTN, Seizure Disorder, Depression, BPH, Hx CVA, Chronic Pancreatitis s/p pancreatic duct stenting, Alcoholic Cirrhosis, Hx Cardiac Arrest.  Syncope Multiple reported syncopal episodes in the last two months and one observed in clinic on 5/19.  Keppra level pending, but doubt compliance.  Echo with EF greater than 65%, impaired relaxation, aneurysmal intra-atrial septum, elevated LA pressure.  EKG sinus rhythm with PAC, QTC 459.  Troponin negative, chest x-ray is suggestive of COPD, ethanol level negative.  Initial electrolytes within normal limits aside from mild hypomagnesemia.  TSH WNL.   CT head with no acute changes, showed old left parietal infarct.  Cardiology was consulted and believe is less likely caused by cardiac abnormality and signed off.  EEG read WNL.  Orthostatic blood pressures negative for orthostatic hypotension, no heart rate recorded or standing at 5 minutes, will have repeat ordered. - cardiac monitoring - continuous pulse ox -Repeat orthostatic vitals -UDS pending - Keppra Level - will restart Keppra at 500mg  BID as he was non-compliant with 1000 BID - cardiology consult, appreciate recs, have signed off - f/u cardiology as outpatient  - f/u neuro as outpatient - CIWA protocol -PT/OT: Snf, patient agrees, consult to [place] in  Hand Laceration Occurred following fall 5/18.  Repaired in [facility] 5/19.    This a.m. well-appearing without surrounding erythema, dressing changed on rounds.  Pain  well-controlled. - cont Keflex 500mg  TID x 5 days - general wound care by nursing - remove sutures in 10-14 days  Hypomagnesmia: Resolved, hypokalemia Magnesium 1.5>2.7.  K3.9>3.4. -K-dur x2 -Monitor BMP  Macrocytic anemia Patient chronic alcoholic, likely cause.  Hemoglobin today 10.9, MCV 100.9.  Takes vitamin B12 at home, and is likely noncompliant as well. -Monitor CBC -Check vitamin B12 and folate  Leukocytosis: Resolved WBC 15>10.5.  Most likely secondary to demargination at wound.  Patient has remained afebrile. -Continue to monitor CBC  Diabetes: New Diagnosis Hgb A1c 7.9 on admission.    CBG this a.m. 149.  Did not receive any insulin yesterday. - sSSI - CBGs AC/HC - DM education - plan to start metformin 500 on d/c  HTN On Norvasc 5 mg at home, likely not taking.    BP this a.m. 123/76. - cont norvasc  -Repeat orthostatic vitals  Seizure Disorder On Keppra 1000 milligrams BID, but non-compliant.  Neuro exam on admission with decreased vision in left temporal visual field, decreased sensation in left lower extremity, contracture of right hand, which patient reports to be his baseline.  CT head with no acute changes.  EEG pending this AM. - Keppra level - EEG - restart keppra 500mg  BID  Depression Takes Zoloft 100mg  QD, although unclear if is compliant.   - cont home zoloft  BPH On Flomax at home, but states that he does not take it all the time.  - cont flomax pending repeat orthostatic vitals   Chronic Pancreatitis   Alcoholic Cirrhosis, EtOH Use Takes Creon 12,000 units 3 times daily before meals, had not been taking in months.  Lipase 18 on admission.  Abdomen  diffusely mildly tender.  LFTs within normal limits on admission. CIWA overnight 7, did not receive ativan. - monitor on CIWA protocol -Continue Creon 3 times daily  Hx V. Fib Cardiac Arrest June 2011, Chronic Atypical Chest Pain History of CAD on cardiac CT.  Occurred following pancreatic  duct stenting in June 2019, during which he had anoxic brain injury.  Has had life vest in the past and refused to continue wearing it.  Last seen by EP cardiologist Dr. Rayann Heman 01/2018. - No indication for ICD as cardiac arrest is setting of malnutrition, ETOH, metabolic derangements per EP cardiology note.  On Coreg 3.125mg  BID at home, also unclear if compliant.    Endorses chronic intermittent chest pain, known to cardiology. - cont home coreg - cardiology consulted, appreciate recs, has signed off - cardiac monitoring  Hx CVA 2013, Wernicke's Encephalopathy, Hx Anoxic Brain Injury during Cardiac Arrest Hx Left parietal infarct on MRI with atrophy and small vessel disease in 2013.  CT head 5/19 with left parietal infarct, unchanged from previous.  On Lipitor 40mg , Coreg 3.125 BID, ASA 81mg  at home. - cont home lipitor, ASA, coreg  Tobacco abuse, THC use -Nicotine patch -UDS  FEN/GI: Heart healthy PPx: Lovenox  Disposition: SNF, medically cleared for discharge  Subjective:  Patient reports no further syncopal episodes while inpatient.  States that pain is well controlled in his hand with current regimen.  Denies any complaints at this time.  Objective: Temp:  [97.6 F (36.4 C)-98.8 F (37.1 C)] 97.6 F (36.4 C) (05/20 0339) Pulse Rate:  [67-87] 67 (05/20 0339) Resp:  [18] 18 (05/20 0339) BP: (118-143)/(62-80) 123/76 (05/20 0339) SpO2:  [98 %-100 %] 100 % (05/20 0339) Weight:  [49.3 kg] 49.3 kg (05/19 1950)  Physical Exam:  General: 63 y.o. male in NAD, thin Cardio: RRR no m/r/g Lungs: CTAB, no wheezing, no rhonchi, no crackles, no IWOB on RA Abdomen: Soft, mildly diffusely tender, non-distended, positive bowel sounds Skin: warm and dry Extremities: No edema, thin, hand laceration and right hand between first and second digits, sutures in place, no surrounding erythema or discharge, see below         Laboratory: Recent Labs  Lab 02/26/19 1624 02/27/19 0303  WBC  15.0* 10.5  HGB 12.2* 10.9*  HCT 39.0 34.5*  PLT 279 241   Recent Labs  Lab 02/26/19 1624 02/27/19 0303  NA 142 139  K 3.9 3.4*  CL 110 106  CO2 17* 23  BUN 8 9  CREATININE 0.78 0.77  CALCIUM 8.7* 8.4*  PROT 7.3 6.2*  BILITOT 0.8 1.6*  ALKPHOS 72 67  ALT 28 23  AST 31 24  GLUCOSE 130* 173*    Imaging/Diagnostic Tests: X-ray Chest Pa And Lateral  Result Date: 02/26/2019 CLINICAL DATA:  Pain status post fall EXAM: CHEST - 2 VIEW COMPARISON:  Chest x-ray dated 11/13/2015 FINDINGS: The cardiac silhouette is unremarkable. There is no pneumothorax. No large pleural effusion. There is mild blunting of the left costophrenic angle. The lungs are hyperexpanded which can be seen in patients with COPD. 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 IMPRESSION: 1. No acute cardiopulmonary process. 2. COPD. Electronically Signed   By: Constance Holster M.D.   On: 02/26/2019 17:30   Ct Head Wo Contrast  Result Date: 02/26/2019 CLINICAL DATA:  Syncope EXAM: CT HEAD WITHOUT CONTRAST TECHNIQUE: Contiguous axial images were obtained from the base of the skull through the vertex without intravenous  contrast. COMPARISON:  01/30/2012 FINDINGS: Brain: No evidence of acute infarction, hemorrhage, hydrocephalus, extra-axial collection or mass lesion/mass effect. There is left parietal encephalomalacia in keeping with prior infarction. Vascular: No hyperdense vessel or unexpected calcification. Skull: Normal. Negative for fracture or focal lesion. Sinuses/Orbits: No acute finding. Other: None. IMPRESSION: No acute intracranial pathology. There is a left parietal encephalomalacia in keeping with prior infarction demonstrated on CT dated 01/30/2012. Electronically Signed   By: Eddie Candle M.D.   On: 02/26/2019 16:10    Gomer France, Bernita Raisin, DO 02/27/2019, 7:42 AM PGY-1, [place] Intern pager: [phone], text pages welcome

## 2019-02-27 NOTE — Procedures (Signed)
ELECTROENCEPHALOGRAM REPORT   Patient: Steven Frey.       Room #: 5M01C EEG No. ID: [id number] Age: 62 y.o.        Sex: male Referring Physician: Hensel Report Date:  02/27/2019        Interpreting Physician: Alexis Goodell  History: Bryten Maher. is an 63 y.o. male with syncope  Medications:  Norvasc, ASA, Lipitor, Coreg, Keflex, Folvite, Insulin, MVI, K-dur, Zoloft, Flomax, Thiamine  Conditions of Recording:  This is a 21 channel routine scalp EEG performed with bipolar and monopolar montages arranged in accordance to the international 10/20 system of electrode placement. One channel was dedicated to EKG recording.  The patient is in the awake state.  Description:  The waking background activity consists of a low voltage, symmetrical, fairly well organized, 8-9 Hz alpha activity, seen from the parieto-occipital and posterior temporal regions.  Low voltage fast activity, poorly organized, is seen anteriorly and is at times superimposed on more posterior regions.  A mixture of theta and alpha rhythms are seen from the central and temporal regions. The patient does not drowse or sleep. No epileptiform activity is noted.   Hyperventilation was not performed.  Intermittent photic stimulation was performed but failed to illicit any change in the tracing.   IMPRESSION: This is a normal awake electroencephalogram with activation procedures. There are no focal lateralizing or epileptiform features.   Alexis Goodell, MD Neurology [phone] 02/27/2019, 9:56 AM

## 2019-02-28 ENCOUNTER — Ambulatory Visit: Payer: Medicare Other | Admitting: Family Medicine

## 2019-02-28 DIAGNOSIS — S61219D Laceration without foreign body of unspecified finger without damage to nail, subsequent encounter: Secondary | ICD-10-CM

## 2019-02-28 LAB — CBC
HCT: 34.8 % — ABNORMAL LOW (ref 39.0–52.0)
Hemoglobin: 10.9 g/dL — ABNORMAL LOW (ref 13.0–17.0)
MCH: 31.6 pg (ref 26.0–34.0)
MCHC: 31.3 g/dL (ref 30.0–36.0)
MCV: 100.9 fL — ABNORMAL HIGH (ref 80.0–100.0)
Platelets: 235 10*3/uL (ref 150–400)
RBC: 3.45 MIL/uL — ABNORMAL LOW (ref 4.22–5.81)
RDW: 12 % (ref 11.5–15.5)
WBC: 8.1 10*3/uL (ref 4.0–10.5)
nRBC: 0.2 % (ref 0.0–0.2)

## 2019-02-28 LAB — COMPREHENSIVE METABOLIC PANEL
ALT: 19 U/L (ref 0–44)
AST: 20 U/L (ref 15–41)
Albumin: 3 g/dL — ABNORMAL LOW (ref 3.5–5.0)
Alkaline Phosphatase: 66 U/L (ref 38–126)
Anion gap: 8 (ref 5–15)
BUN: 9 mg/dL (ref 8–23)
CO2: 22 mmol/L (ref 22–32)
Calcium: 8.5 mg/dL — ABNORMAL LOW (ref 8.9–10.3)
Chloride: 107 mmol/L (ref 98–111)
Creatinine, Ser: 0.83 mg/dL (ref 0.61–1.24)
GFR calc Af Amer: >60 mL/min (ref >60)
GFR calc non Af Amer: >60 mL/min (ref >60)
Glucose, Bld: 152 mg/dL — ABNORMAL HIGH (ref 70–99)
Potassium: 4.1 mmol/L (ref 3.5–5.1)
Sodium: 137 mmol/L (ref 135–145)
Total Bilirubin: 1.1 mg/dL (ref 0.3–1.2)
Total Protein: 6.1 g/dL — ABNORMAL LOW (ref 6.5–8.1)

## 2019-02-28 LAB — SARS CORONAVIRUS 2 BY RT PCR (HOSPITAL ORDER, PERFORMED IN ~~LOC~~ HOSPITAL LAB): SARS Coronavirus 2: NEGATIVE

## 2019-02-28 LAB — FOLATE RBC
Folate, Hemolysate: >620 ng/mL
Folate, RBC: >1797 ng/mL (ref >498)
Hematocrit: 34.5 % — ABNORMAL LOW (ref 37.5–51.0)

## 2019-02-28 LAB — GLUCOSE, CAPILLARY
Glucose-Capillary: 151 mg/dL — ABNORMAL HIGH (ref 70–99)
Glucose-Capillary: 214 mg/dL — ABNORMAL HIGH (ref 70–99)
Glucose-Capillary: 190 mg/dL — ABNORMAL HIGH (ref 70–99)
Glucose-Capillary: 210 mg/dL — ABNORMAL HIGH (ref 70–99)

## 2019-02-28 MED ORDER — METFORMIN HCL 500 MG PO TABS
500.0000 mg | ORAL_TABLET | Freq: Every day | ORAL | Status: DC
  Administered 2019-03-01: 500 mg via ORAL
  Filled 2019-02-28: qty 1

## 2019-02-28 NOTE — Progress Notes (Addendum)
Family Medicine Teaching Service Daily Progress Note Intern Pager: [phone]  Patient name: Steven Frey. Medical record number: [id number] Date of birth: 1955-12-21 Age: 63 y.o. Gender: male  Primary Care Provider: Leeanne Rio, MD Consultants: Cardiology Code Status: Full  Pt Overview and Major Events to Date:  5/19 direct admit to FPTS  Assessment and Plan: Steven Frey. is a 63 y.o. male presenting with syncope as direct admission from [facility]. PMH is significant for HTN, Seizure Disorder, Depression, BPH, Hx CVA, Chronic Pancreatitis s/p pancreatic duct stenting, Alcoholic Cirrhosis, Hx Cardiac Arrest.  Syncope Multiple reported syncopal episodes in the last two months and one observed in clinic on 5/19.  Echo with EF greater than 65%, impaired relaxation, aneurysmal intra-atrial septum, elevated LA pressure.  EKG sinus rhythm with PAC, QTC 459.  Troponin negative, chest x-ray is suggestive of COPD, ethanol level negative.  Initial electrolytes within normal limits aside from mild hypomagnesemia.  TSH WNL.   CT head with no acute changes, showed old left parietal infarct.  Cardiology was consulted and believe is less likely caused by cardiac abnormality and signed off.  EEG read WNL.  Orthostatic vitals repeat pending.  Keppra level 11.8, has been taking some Keppra, although reports occasional non-compliance.  No further episodes while inpatient. -Repeat orthostatic vitals -UDS pending - cont Keppra 500mg  BID - cardiology consult, appreciate recs, have signed off - f/u cardiology as outpatient  - f/u neuro as outpatient - CIWA protocol: 0>0 -PT/OT: Snf, patient agrees, consult to [place] in  Hand Laceration Occurred following fall 5/18.  Repaired in [facility] 5/19.  Nursing changed today.  Wound is well-appearing without surrounding erythema or discharge. - cont Keflex 500mg  TID x 5 days (5/19- ) - general wound care by nursing - remove sutures in 10-14 days  Hypomagnesmia,  hypokalemia: Resolved K 4.1 today -Monitor BMP  Macrocytic anemia Patient chronic alcoholic, likely cause.  Hemoglobin stable 10.9.   Takes vitamin B12 at home, level 1035. -Monitor CBC -folate pending  Diabetes: New Diagnosis Hgb A1c 7.9 on admission.    CBG this a.m. 151.  Did not receive any insulin yesterday. - sSSI - CBGs AC/HC - DM education  HTN On Norvasc 5 mg at home, likely not taking.    BP this a.m. 127/68. - cont norvasc  -Repeat orthostatic vitals  Seizure Disorder On Keppra 1000 milligrams BID, but reports non-compliance, level 11.4, in low-therapeutic range.  Neuro exam on admission with decreased vision in left temporal visual field, decreased sensation in left lower extremity, contracture of right hand, which patient reports to be his baseline.  CT head with no acute changes.  EEG WNL. - cont keppra 500mg  BID  Depression Takes Zoloft 100mg  QD, although unclear if is compliant.   - cont home zoloft  BPH On Flomax at home, but states that he does not take it all the time.  - cont flomax pending repeat orthostatic vitals   Chronic Pancreatitis  Alcoholic Cirrhosis, EtOH Use Takes Creon 12,000 units 3 times daily before meals, had not been taking in months.  Lipase 18 on admission.  Abdomen diffusely mildly tender.  LFTs within normal limits on admission.  - monitor on CIWA protocol: 0>0 -Continue Creon 3 times daily  Hx V. Fib Cardiac Arrest June 2011, Chronic Atypical Chest Pain History of CAD on cardiac CT.  Occurred following pancreatic duct stenting in June 2019, during which he had anoxic brain injury.  Has had life vest in the past and refused  to continue wearing it.  Last seen by EP cardiologist Dr. Rayann Heman 01/2018. - No indication for ICD as cardiac arrest is setting of malnutrition, ETOH, metabolic derangements per EP cardiology note.  On Coreg 3.125mg  BID at home, also unclear if compliant.    Endorses chronic intermittent chest pain, known to  cardiology. - cont home coreg - cardiology consulted, appreciate recs, has signed off - cardiac monitoring  Hx CVA 2013, Wernicke's Encephalopathy, Hx Anoxic Brain Injury during Cardiac Arrest Hx Left parietal infarct on MRI with atrophy and small vessel disease in 2013.  CT head 5/19 with left parietal infarct, unchanged from previous.  On Lipitor 40mg , Coreg 3.125 BID, ASA 81mg  at home. - cont home lipitor, ASA, coreg  Tobacco abuse, THC use -Nicotine patch -UDS  FEN/GI: Heart healthy PPx: Lovenox  Disposition: SNF, medically cleared for discharge  Subjective:  Patient denies any complaints this morning.  States that he has not had any further syncopal episodes.  Notes that he is feeling well.  Objective: Temp:  [97.9 F (36.6 C)-98.6 F (37 C)] 97.9 F (36.6 C) (05/21 0426) Pulse Rate:  [70] 70 (05/21 0426) Resp:  [18] 18 (05/21 0426) BP: (118-143)/(68-78) 127/68 (05/21 0426) SpO2:  [99 %-100 %] 99 % (05/21 0426) Weight:  [49.2 kg] 49.2 kg (05/20 2007)  Physical Exam:  General: 63 y.o. male in NAD, thin Cardio: RRR no m/r/g Lungs: CTAB, no wheezing, no rhonchi, no crackles, no IWOB on RA Abdomen: Soft, mildly diffusely tender to palpation, non-distended, positive bowel sounds Skin: warm and dry Extremities: Thin, no edema, laceration on right hand between first and second digit without surrounding erythema or discharge, see below, bandage placed after examination        Laboratory: Recent Labs  Lab 02/26/19 1624 02/27/19 0303 02/28/19 0320  WBC 15.0* 10.5 8.1  HGB 12.2* 10.9* 10.9*  HCT 39.0 34.5* 34.8*  PLT 279 241 235   Recent Labs  Lab 02/26/19 1624 02/27/19 0303 02/28/19 0320  NA 142 139 137  K 3.9 3.4* 4.1  CL 110 106 107  CO2 17* 23 22  BUN 8 9 9   CREATININE 0.78 0.77 0.83  CALCIUM 8.7* 8.4* 8.5*  PROT 7.3 6.2* 6.1*  BILITOT 0.8 1.6* 1.1  ALKPHOS 72 67 66  ALT 28 23 19   AST 31 24 20   GLUCOSE 130* 173* 152*    Imaging/Diagnostic  Tests: No results found.  [place], DO 02/28/2019, 7:54 AM PGY-1, [place] Intern pager: [phone], text pages welcome

## 2019-02-28 NOTE — TOC Initial Note (Signed)
Transition of Care (TOC) - Initial/Assessment Note    Patient Details  Name: Steven Frey. MRN: [id number] Date of Birth: Nov 13, 1955  Transition of Care [facility]) CM/SW Contact:    Sable Feil, LCSW Phone Number: 02/28/2019, 1:35 PM  Clinical Narrative:  CSW talked with patient at the bedside regarding his discharge plan. Steven Frey was sitting up in bed and was alert, oriented and agreeable to talking with CSW. Patient advised of PT/OT recommendations of SNF for ST rehab and Steven Frey expressed agreement, stating that he needs to "get back on his feet". Patient reported that he and his dad live together and his daughter, Steven Frey. Harts recently moved in with them after losing her job due to COVID-19. When asked, patient reported that he has been to SNF before for rehab several years ago, but could not remember the names of the facilities. Steven Frey reported (smiling) that he and his dad take care of each other and that his dad gets around better then he does. Patient also reported that his mom is deceased and he has another  The facility search process was explained and Steven Frey was provided with the Medicare.gov SNF list.                Expected Discharge Plan: Skilled Nursing Facility Barriers to Discharge: SNF Pending bed offer, Other (comment)(COVID-19 test result)   Patient Goals and CMS Choice Patient states their goals for this hospitalization and ongoing recovery are:: Patient indicated that he needs to "get back on his feet" CMS Medicare.gov Compare Post Acute Care list provided to:: Patient Choice offered to / list presented to : Patient  Expected Discharge Plan and Services Expected Discharge Plan: [place] In-house Referral: Clinical Social Work Discharge Planning Services: Other - See comment(SNF) Post Acute Care Choice: [place] arrangements for the past 2 months: Mobile Home                                       Prior Living Arrangements/Services Living arrangements for the past 2 months: Mobile Home Lives with:: Parents, Adult Children(Patient and his dad live together. Steven Frey also reported that his daughter recently moved in with them) Patient language and need for interpreter reviewed:: No Do you feel safe going back to the place where you live?: No   Patient agreeable to [facility] re[facility] before returning home  Need for Family Participation in Patient Care: No (Comment) Care giver support system in place?: No (comment) Current home services: Other (comment)(No home services) Criminal Activity/Legal Involvement Pertinent to Current Situation/Hospitalization: No - Comment as needed  Activities of Daily Living Home Assistive Devices/Equipment: Cane (specify quad or straight) ADL Screening (condition at time of admission) Patient's cognitive ability adequate to safely complete daily activities?: Yes Is the patient deaf or have difficulty hearing?: Yes Does the patient have difficulty seeing, even when wearing glasses/contacts?: No Does the patient have difficulty concentrating, remembering, or making decisions?: No Patient able to express need for assistance with ADLs?: Yes Does the patient have difficulty dressing or bathing?: Yes Independently performs ADLs?: No Does the patient have difficulty walking or climbing stairs?: Yes Weakness of Legs: Both Weakness of Arms/Hands: Both  Permission Sought/Granted Permission sought to share information with : Other (comment)(Did not request to speak with family)                Emotional  Assessment Appearance:: Appears stated age Attitude/Demeanor/Rapport: Engaged Affect (typically observed): Appropriate, Pleasant Orientation: : Oriented to Self, Oriented to Situation, Oriented to Place, Oriented to  Time Alcohol / Substance Use: Tobacco Use, Alcohol Use, Illicit Drugs(Patient reported (per H&P) that he currently smokes and drinks and  has had no drug use sine 2011) Psych Involvement: No (comment)  Admission diagnosis:  syncope Patient Active Problem List   Diagnosis Date Noted  . Polyuria 03/14/2016  . Alcohol-induced chronic pancreatitis ([place]) 01/18/2016  . Pancreas divisum 01/18/2016  . 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 Cirrhosis of liver without ascites ([place])   . Abdominal pain   . Acute on chronic pancreatitis ([place])   . Pleural effusion   . Pancreatic ascites 11/12/2015  . Tachycardia 11/12/2015  . Spontaneous bacterial peritonitis ([place])   . Ascites   . Protein-calorie malnutrition, moderate ([place]) 11/03/2015  . Pancreatitis, acute   . Pancreatitis 11/02/2015  . Hyperglycemia 03/05/2015  . Pain and swelling of right forearm 03/05/2015  . Diastolic dysfunction 39/76/7341  . Syncope and collapse 12/26/2014  . Constipation 10/26/2014  . Major depression, single episode 09/26/2014  . Preoperative cardiovascular examination 03/28/2013  . 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 Left inguinal hernia 10/30/2012  . Seizure disorder ([place]) 09/03/2012  . History of CVA (cerebrovascular accident) 01/30/2012  . Sinus tachycardia 10/26/2011  . Pre-operative cardiovascular examination 10/26/2011  . 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 ARM PAIN, RIGHT 11/02/2010  . ANXIETY 10/08/2010  . LEG EDEMA 08/24/2010  . ABSCESS OF LIVER 08/13/2010  . Anoxic brain damage ([place]) 07/07/2010  . CARDIAC ARREST 05/24/2010  . VITAMIN B12 DEFICIENCY 03/25/2010  . Hyperlipidemia 03/15/2010  . Anemia 03/15/2010  . CIRRHOSIS, ALCOHOLIC 93/79/0240  . Chronic pancreatitis ([place]) 02/23/2010  . Chest pain 02/23/2010  . ERECTILE DYSFUNCTION 01/26/2009  . Alcohol abuse 01/26/2009  . VISUAL IMPAIRMENT 01/26/2009  . CIGARETTE SMOKER 03/10/2008  . HYPERTENSION, BENIGN 03/10/2008   PCP:  Leeanne Rio, MD Pharmacy:   [id number] - [place], [place] [address] [place] [address] Phone: [phone] Fax: [phone]   Social Determinants of Health (SDOH) Interventions  No SDOH intervention needed at this time.  Readmission Risk Interventions No flowsheet data found.

## 2019-02-28 NOTE — TOC Progression Note (Signed)
Transition of Care (TOC) - Progression Note    Patient Details  Name: Steven Frey. MRN: [id number] Date of Birth: 05/25/1956  Transition of Care [facility]) CM/SW Contact  Sharlet Salina Mila Homer, LCSW Phone Number: 02/28/2019, 4:26 PM  Clinical Narrative:  Facility search initiated today and [facility] [employer]. Contacted [facility] this afternoon and they requested a family member come tomorrow early to do paperwork before patient discharges. CSW talked with patient regarding possibly his daughter assisting with paperwork and, however patient explained that he will have to talk with her as she mat have to [facility] tomorrow as her child is there with the dad. Contacted Janie, admissions director while in room with patient and she indicated that if no family available to do paperwork, they will do with patient tomorrow. MD paged and updated.   Expected Discharge Plan: [place] Barriers to Discharge: SNF Pending bed offer, Other (comment)(COVID-19 test result)  Expected Discharge Plan and Services Expected Discharge Plan: [place]-5/22 In-house Referral: Clinical Social Work Discharge Planning Services: Other - See comment(SNF) Post Acute Care Choice: [place] arrangements for the past 2 months: Mobile Home                                     Social Determinants of Health (SDOH) Interventions  No SDOH interventions needed at this time.  Readmission Risk Interventions No flowsheet data found.

## 2019-02-28 NOTE — Progress Notes (Signed)
Physical Therapy Treatment Patient Details Name: Domanic Matusek. MRN: [id number] DOB: 03/30/1956 Today's Date: 02/28/2019    History of Present Illness Garren Greenman. is a 63 y.o. male presenting with syncope as direct admission from [facility]. PMH is significant for HTN, Seizure Disorder, Depression, BPH, Hx CVA, Chronic Pancreatitis s/p pancreatic duct stenting, Alcoholic Cirrhosis, Hx Cardiac Arrest.    PT Comments    Patient tried cane but was unable to because of laceration on his left hand and inability to use his right hand. He was able to ambulate 30' but reported some syncope. Patient would benefit from rehabilitation in order to improve hissafety with mobility.    Follow Up Recommendations  SNF;Supervision/Assistance - 24 hour     Equipment Recommendations       Recommendations for Other Services       Precautions / Restrictions Precautions Precautions: Fall Restrictions Weight Bearing Restrictions: No    Mobility  Bed Mobility Overal bed mobility: Modified Independent                Transfers Overall transfer level: Needs assistance Equipment used: 1 person hand held assist Transfers: Sit to/from Stand Sit to Stand: Min assist         General transfer comment: Min a for inital standing balance   Ambulation/Gait Ambulation/Gait assistance: Min assist   Assistive device: None Gait Pattern/deviations: Step-to pattern Gait velocity: decraesed Gait velocity interpretation: <1.31 ft/sec, indicative of household ambulator General Gait Details: tried cane but patient felt pulling in his stitches. Cane stopped immedietly.    Stairs             Wheelchair Mobility    Modified Rankin (Stroke Patients Only)       Balance Overall balance assessment: History of Falls                                          Cognition Arousal/Alertness: Awake/alert Behavior During Therapy: WFL for tasks assessed/performed Overall  Cognitive Status: History of cognitive impairments - at baseline                                 General Comments: hx of WE, was AOx4 today, following all cues and commands.       Exercises      General Comments        Pertinent Vitals/Pain Pain Assessment: Faces Faces Pain Scale: Hurts a little bit Pain Location: L HAND Pain Descriptors / Indicators: Aching Pain Intervention(s): Monitored during session;Limited activity within patient's tolerance    Home Living                      Prior Function            PT Goals (current goals can now be found in the care plan section) Acute Rehab PT Goals Patient Stated Goal: to go home PT Goal Formulation: With patient Time For Goal Achievement: 03/13/19 Potential to Achieve Goals: Fair Progress towards PT goals: Progressing toward goals    Frequency    Min 3X/week      PT Plan Current plan remains appropriate    Co-evaluation              AM-PAC PT "6 Clicks" Mobility   Outcome Measure  Help needed turning from your back to  your side while in a flat bed without using bedrails?: A Little Help needed moving from lying on your back to sitting on the side of a flat bed without using bedrails?: A Little Help needed moving to and from a bed to a chair (including a wheelchair)?: A Little Help needed standing up from a chair using your arms (e.g., wheelchair or bedside chair)?: A Lot Help needed to walk in hospital room?: A Lot Help needed climbing 3-5 steps with a railing? : A Lot 6 Click Score: 15    End of Session Equipment Utilized During Treatment: Gait belt Activity Tolerance: Patient tolerated treatment well Patient left: in chair;with call bell/phone within reach;with chair alarm set Nurse Communication: Mobility status PT Visit Diagnosis: Unsteadiness on feet (R26.81)     Time: 1440-1455 PT Time Calculation (min) (ACUTE ONLY): 15 min  Charges:  $Gait Training: 8-22  mins    Carney Living  PT DPT 02/28/2019, 4:28 PM

## 2019-02-28 NOTE — NC FL2 (Signed)
[place] LEVEL OF CARE SCREENING TOOL     IDENTIFICATION  Patient Name: Steven Frey. Birthdate: 02-Jul-1956 Sex: male Admission Date (Current Location): 02/26/2019  [facility] and [place] Number:  Herbalist and Address:  The . [facility], [place] [address], [place], [place] [address]      Provider Number: [id number]  Attending Physician Name and Address:  Martyn Malay, MD  Relative Name and Phone Number:  Cameron Ali; [phone] (cell), [phone] (home)    Current Level of Care: Hospital Recommended Level of Care: [place] Prior Approval Number:    Date Approved/Denied:   PASRR Number: [id number](Eff. 04/05/10)  Discharge Plan: SNF    Current Diagnoses: Patient Active Problem List   Diagnosis Date Noted  . Polyuria 03/14/2016  . Alcohol-induced chronic pancreatitis ([place]) 01/18/2016  . Pancreas divisum 01/18/2016  . 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 Cirrhosis of liver without ascites ([place])   . Abdominal pain   . Acute on chronic pancreatitis ([place])   . Pleural effusion   . Pancreatic ascites 11/12/2015  . Tachycardia 11/12/2015  . Spontaneous bacterial peritonitis ([place])   . Ascites   . Protein-calorie malnutrition, moderate ([place]) 11/03/2015  . Pancreatitis, acute   . Pancreatitis 11/02/2015  . Hyperglycemia 03/05/2015  . Pain and swelling of right forearm 03/05/2015  . Diastolic dysfunction 34/19/3790  . Syncope and collapse 12/26/2014  . Constipation 10/26/2014  . Major depression, single episode 09/26/2014  . Preoperative cardiovascular examination 03/28/2013  . 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 Left inguinal hernia 10/30/2012  . Seizure disorder ([place]) 09/03/2012  . History of CVA (cerebrovascular accident) 01/30/2012  . Sinus tachycardia 10/26/2011  . Pre-operative cardiovascular examination 10/26/2011  . ORGANIC BRAIN SYNDROME  01/04/2011  . ARM PAIN, RIGHT 11/02/2010  . ANXIETY 10/08/2010  . LEG EDEMA 08/24/2010  . ABSCESS OF LIVER 08/13/2010  . Anoxic brain damage ([place]) 07/07/2010  . CARDIAC ARREST 05/24/2010  . VITAMIN B12 DEFICIENCY 03/25/2010  . Hyperlipidemia 03/15/2010  . Anemia 03/15/2010  . CIRRHOSIS, ALCOHOLIC 24/06/7352  . Chronic pancreatitis ([place]) 02/23/2010  . Chest pain 02/23/2010  . ERECTILE DYSFUNCTION 01/26/2009  . Alcohol abuse 01/26/2009  . VISUAL IMPAIRMENT 01/26/2009  . CIGARETTE SMOKER 03/10/2008  . HYPERTENSION, BENIGN 03/10/2008    Orientation RESPIRATION BLADDER Height & Weight     Self, Time, Situation, Place  Normal Continent Weight: 108 lb 7.5 oz (49.2 kg) Height:  5\' 8"  (172.7 cm)  BEHAVIORAL SYMPTOMS/MOOD NEUROLOGICAL BOWEL NUTRITION STATUS    Convulsions/Seizures(History seizure disorder) Continent Diet(Regular)  AMBULATORY STATUS COMMUNICATION OF NEEDS Skin   Limited Assist(Min assist) Verbally Skin abrasions, Other (Comment)(Abrasion knee, Laceration left antrior hand with dressing. )                       Personal Care Assistance Level of Assistance  Bathing, Feeding, Dressing Bathing Assistance: Limited assistance(Upper body-set-up; Lower body min assist) Feeding assistance: Limited assistance(ASsistance with set-up) Dressing Assistance: Maximum assistance(Upper body min assist, Lower body mod assist)     Functional Limitations Info  Sight, Hearing, Speech Sight Info: Adequate(Wears glasses) Hearing Info: Adequate Speech Info: Adequate    SPECIAL CARE FACTORS FREQUENCY  PT (By licensed PT), OT (By licensed OT)     PT Frequency: PT evaluation at hospital 5/20. PT at SNF Eval and Treat, minimum of 5 days per week OT Frequency: OT evaluation at hospital 5/20. OT at [facility]  Eval and Treat, minimum of 5 days per week            Contractures Contractures Info: Present(2 fingers of right hand contracted)    Additional Factors Info  Code Status,  Allergies Code Status Info: Full Allergies Info: Ace Inhibitors, Azithromycin, Lisinopril           Current Medications (02/28/2019):  This is the current hospital active medication list Current Facility-Administered Medications  Medication Dose Route Frequency Provider Last Rate Last Dose  . acetaminophen (TYLENOL) tablet 650 mg  650 mg Oral Q6H PRN Meccariello, Bernita Raisin, DO   650 mg at 02/26/19 1643   Or  . acetaminophen (TYLENOL) suppository 650 mg  650 mg Rectal Q6H PRN Meccariello, Bernita Raisin, DO      . amLODipine (NORVASC) tablet 5 mg  5 mg Oral Daily Meccariello, Bailey J, DO   5 mg at 02/28/19 0819  . aspirin EC tablet 81 mg  81 mg Oral Daily Meccariello, Bernita Raisin, DO   81 mg at 02/28/19 0818  . atorvastatin (LIPITOR) tablet 40 mg  40 mg Oral Daily Meccariello, Bernita Raisin, DO   40 mg at 02/28/19 0819  . carvedilol (COREG) tablet 3.125 mg  3.125 mg Oral BID WC Meccariello, Bailey J, DO   3.125 mg at 02/28/19 0818  . cephALEXin (KEFLEX) capsule 500 mg  500 mg Oral QID Meccariello, Bailey J, DO   500 mg at 02/28/19 0818  . enoxaparin (LOVENOX) injection 40 mg  40 mg Subcutaneous Q24H Bland, Scott, DO   40 mg at 02/27/19 1946  . folic acid (FOLVITE) tablet 1 mg  1 mg Oral Daily Meccariello, Bailey J, DO   1 mg at 02/28/19 0818  . insulin aspart (novoLOG) injection 0-5 Units  0-5 Units Subcutaneous QHS Bland, Scott, DO      . insulin aspart (novoLOG) injection 0-9 Units  0-9 Units Subcutaneous TID WC Bland, Scott, DO   2 Units at 02/28/19 4132  . levETIRAcetam (KEPPRA) tablet 500 mg  500 mg Oral BID Meccariello, Bernita Raisin, DO   500 mg at 02/28/19 0818  . lipase/protease/amylase (CREON) capsule 12,000 Units  12,000 Units Oral TID WC Meccariello, Bernita Raisin, DO   12,000 Units at 02/28/19 0818  . LORazepam (ATIVAN) tablet 1 mg  1 mg Oral Q6H PRN Meccariello, Bernita Raisin, DO   1 mg at 02/26/19 2151   Or  . LORazepam (ATIVAN) injection 1 mg  1 mg Intravenous Q6H PRN Meccariello, Bernita Raisin, DO      .  [START ON 03/01/2019] metFORMIN (GLUCOPHAGE) tablet 500 mg  500 mg Oral Q breakfast Meccariello, Bernita Raisin, DO      . multivitamin with minerals tablet 1 tablet  1 tablet Oral Daily Meccariello, Bernita Raisin, DO   1 tablet at 02/28/19 4401  . nicotine (NICODERM CQ - dosed in mg/24 hr) patch 7 mg  7 mg Transdermal Daily Meccariello, Bailey J, DO   7 mg at 02/28/19 0820  . oxyCODONE (Oxy IR/ROXICODONE) immediate release tablet 2.5 mg  2.5 mg Oral Q4H PRN Meccariello, Bernita Raisin, DO   2.5 mg at 02/28/19 0820  . sertraline (ZOLOFT) tablet 100 mg  100 mg Oral Daily Meccariello, Bernita Raisin, DO   100 mg at 02/28/19 0820  . sodium chloride flush (NS) 0.9 % injection 3 mL  3 mL Intravenous Q12H Meccariello, Bailey J, DO   3 mL at 02/28/19 0826  . tamsulosin (FLOMAX) capsule 0.4 mg  0.4 mg Oral  Daily Meccariello, Bernita Raisin, DO   0.4 mg at 02/28/19 0819  . thiamine (VITAMIN B-1) tablet 100 mg  100 mg Oral Daily Meccariello, Bernita Raisin, DO   100 mg at 02/28/19 9935   Or  . thiamine (B-1) injection 100 mg  100 mg Intravenous Daily Meccariello, Bernita Raisin, DO         Discharge Medications: Please see discharge summary for a list of discharge medications.  Relevant Imaging Results:  Relevant Lab Results:   Additional Information ss#[id number].   Sable Feil, LCSW

## 2019-03-01 DIAGNOSIS — R41841 Cognitive communication deficit: Secondary | ICD-10-CM | POA: Diagnosis not present

## 2019-03-01 DIAGNOSIS — R634 Abnormal weight loss: Secondary | ICD-10-CM | POA: Diagnosis not present

## 2019-03-01 DIAGNOSIS — E1165 Type 2 diabetes mellitus with hyperglycemia: Secondary | ICD-10-CM | POA: Diagnosis not present

## 2019-03-01 DIAGNOSIS — S61411D Laceration without foreign body of right hand, subsequent encounter: Secondary | ICD-10-CM | POA: Diagnosis not present

## 2019-03-01 DIAGNOSIS — K861 Other chronic pancreatitis: Secondary | ICD-10-CM | POA: Diagnosis not present

## 2019-03-01 DIAGNOSIS — Z743 Need for continuous supervision: Secondary | ICD-10-CM | POA: Diagnosis not present

## 2019-03-01 DIAGNOSIS — R2681 Unsteadiness on feet: Secondary | ICD-10-CM | POA: Diagnosis not present

## 2019-03-01 DIAGNOSIS — K859 Acute pancreatitis without necrosis or infection, unspecified: Secondary | ICD-10-CM | POA: Diagnosis not present

## 2019-03-01 DIAGNOSIS — R55 Syncope and collapse: Secondary | ICD-10-CM | POA: Diagnosis not present

## 2019-03-01 DIAGNOSIS — F330 Major depressive disorder, recurrent, mild: Secondary | ICD-10-CM | POA: Diagnosis not present

## 2019-03-01 DIAGNOSIS — R278 Other lack of coordination: Secondary | ICD-10-CM | POA: Diagnosis not present

## 2019-03-01 DIAGNOSIS — S61012S Laceration without foreign body of left thumb without damage to nail, sequela: Secondary | ICD-10-CM

## 2019-03-01 DIAGNOSIS — G931 Anoxic brain damage, not elsewhere classified: Secondary | ICD-10-CM | POA: Diagnosis not present

## 2019-03-01 DIAGNOSIS — E785 Hyperlipidemia, unspecified: Secondary | ICD-10-CM | POA: Diagnosis not present

## 2019-03-01 DIAGNOSIS — G40909 Epilepsy, unspecified, not intractable, without status epilepticus: Secondary | ICD-10-CM | POA: Diagnosis not present

## 2019-03-01 DIAGNOSIS — E118 Type 2 diabetes mellitus with unspecified complications: Secondary | ICD-10-CM | POA: Diagnosis not present

## 2019-03-01 DIAGNOSIS — N400 Enlarged prostate without lower urinary tract symptoms: Secondary | ICD-10-CM | POA: Diagnosis not present

## 2019-03-01 DIAGNOSIS — I10 Essential (primary) hypertension: Secondary | ICD-10-CM | POA: Diagnosis not present

## 2019-03-01 DIAGNOSIS — R279 Unspecified lack of coordination: Secondary | ICD-10-CM | POA: Diagnosis not present

## 2019-03-01 DIAGNOSIS — E440 Moderate protein-calorie malnutrition: Secondary | ICD-10-CM | POA: Diagnosis not present

## 2019-03-01 DIAGNOSIS — G40309 Generalized idiopathic epilepsy and epileptic syndromes, not intractable, without status epilepticus: Secondary | ICD-10-CM | POA: Diagnosis not present

## 2019-03-01 DIAGNOSIS — S61412D Laceration without foreign body of left hand, subsequent encounter: Secondary | ICD-10-CM | POA: Diagnosis not present

## 2019-03-01 LAB — GLUCOSE, CAPILLARY
Glucose-Capillary: 275 mg/dL — ABNORMAL HIGH (ref 70–99)
Glucose-Capillary: 91 mg/dL (ref 70–99)

## 2019-03-01 MED ORDER — NICOTINE 7 MG/24HR TD PT24: 7.0000 mg | MEDICATED_PATCH | Freq: Every day | TRANSDERMAL | 0 refills | Status: DC

## 2019-03-01 MED ORDER — CEPHALEXIN 500 MG PO CAPS: 500.0000 mg | ORAL_CAPSULE | Freq: Four times a day (QID) | ORAL | 0 refills | Status: AC

## 2019-03-01 MED ORDER — LEVETIRACETAM 500 MG PO TABS: 500.0000 mg | ORAL_TABLET | Freq: Two times a day (BID) | ORAL | 0 refills | Status: DC

## 2019-03-01 MED ORDER — PANCRELIPASE (LIP-PROT-AMYL) 12000-38000 UNITS PO CPEP: ORAL_CAPSULE | ORAL | 0 refills | Status: DC

## 2019-03-01 MED ORDER — METFORMIN HCL 500 MG PO TABS: 500.0000 mg | ORAL_TABLET | Freq: Every day | ORAL | 0 refills | Status: DC

## 2019-03-01 NOTE — Discharge Instructions (Signed)
Patient should continue to take Keflex until 5/23.  He was newly diagnosed with diabetes and should take metformin daily as well.   He has an appointment for suture removal on 5/29 at [facility].    Please ensure that dressing is changed every day or every other day until suture removal. Patient should also have f/u cardiology appointment.  Neurology referral placed on discharge.  Syncope Syncope is when you pass out (faint) for a short time. It is caused by a sudden decrease in blood flow to the brain. Signs that you may be about to pass out include:  Feeling dizzy or light-headed.  Feeling sick to your stomach (nauseous).  Seeing all white or all black.  Having cold, clammy skin. If you pass out, get help right away. Call your local emergency services (911 in the [place] Do not drive yourself to the hospital. Follow these instructions at home: Watch for any changes in your symptoms. Take these actions to stay safe and help with your symptoms: Lifestyle  Do not drive, use machinery, or play sports until your doctor says it is okay.  Do not drink alcohol.  Do not use any products that contain nicotine or tobacco, such as cigarettes and e-cigarettes. If you need help quitting, ask your doctor.  Drink enough fluid to keep your pee (urine) pale yellow. General instructions  Take over-the-counter and prescription medicines only as told by your doctor.  If you are taking blood pressure or heart medicine, sit up and stand up slowly. Spend a few minutes getting ready to sit and then stand. This can help you feel less dizzy.  Have someone stay with you until you feel stable.  If you start to feel like you might pass out, lie down right away and raise (elevate) your feet above the level of your heart. Breathe deeply and steadily. Wait until all of the symptoms are gone.  Keep all follow-up visits as told by your doctor. This is important. Get help right away if:  You have a very bad  headache.  You pass out once or more than once.  You have pain in your chest, belly, or back.  You have a very fast or uneven heartbeat (palpitations).  It hurts to breathe.  You are bleeding from your mouth or your bottom (rectum).  You have black or tarry poop (stool).  You have jerky movements that you cannot control (seizure).  You are confused.  You have trouble walking.  You are very weak.  You have vision problems. These symptoms may be an emergency. Do not wait to see if the symptoms will go away. Get medical help right away. Call your local emergency services (911 in the [place] Do not drive yourself to the hospital. Summary  Syncope is when you pass out (faint) for a short time. It is caused by a sudden decrease in blood flow to the brain.  Signs that you may be about to faint include feeling dizzy, light-headed, or sick to your stomach, seeing all white or all black, or having cold, clammy skin.  If you start to feel like you might pass out, lie down right away and raise (elevate) your feet above the level of your heart. Breathe deeply and steadily. Wait until all of the symptoms are gone. This information is not intended to replace advice given to you by your health care provider. Make sure you discuss any questions you have with your health care provider. Document Released: 03/14/2008 Document Revised: 11/08/2017  Document Reviewed: 11/08/2017 Elsevier Interactive Patient Education  [employer].

## 2019-03-01 NOTE — Progress Notes (Signed)
Occupational Therapy Treatment Patient Details Name: Steven Frey. MRN: [id number] DOB: 02-Dec-1955 Today's Date: 03/01/2019    History of present illness Steven Frey. is a 63 y.o. male presenting with syncope as direct admission from [facility]. PMH is significant for HTN, Seizure Disorder, Depression, BPH, Hx CVA, Chronic Pancreatitis s/p pancreatic duct stenting, Alcoholic Cirrhosis, Hx Cardiac Arrest.   OT comments  Pt. Seen for skilled OT session.  Provided with adaptive foam to place over utensils and t.brush handle to aide in increasing functional use of L hand during ADLs.  Pt. Able to return demo for oral care with use of toothbrush with min a after set up.  Note d/c likely to snf later today for continued therapies.    Follow Up Recommendations  SNF;Supervision/Assistance - 24 hour    Equipment Recommendations  3 in 1 bedside commode    Recommendations for Other Services      Precautions / Restrictions Precautions Precautions: Fall       Mobility Bed Mobility                  Transfers                      Balance                                           ADL either performed or assessed with clinical judgement   ADL Overall ADL's : Needs assistance/impaired     Grooming: Oral care;Minimal assistance;Bed level;Sitting Grooming Details (indicate cue type and reason): pt. unable to use right hand functionally, was utilizing L hand but now has limited use and grasp due to recent sx. provided with built up foam handle to aide in ability of use of l hand for grooming and feeding. pt. able to brush teeth after set up and adaptations for t.brush handle                               General ADL Comments: provided built up foam for use on t.brush handle, utensils, and any pens/pensils      Vision       Perception     Praxis      Cognition Arousal/Alertness: Awake/alert Behavior During Therapy: WFL for tasks  assessed/performed Overall Cognitive Status: History of cognitive impairments - at baseline                                          Exercises     Shoulder Instructions       General Comments      Pertinent Vitals/ Pain       Faces Pain Scale: Hurts even more Pain Location: L HAND-once we began using it functionally Pain Descriptors / Indicators: Aching Pain Intervention(s): Limited activity within patient's tolerance;Monitored during session;Premedicated before session  Home Living                                          Prior Functioning/Environment              Frequency  Min 2X/week  Progress Toward Goals  OT Goals(current goals can now be found in the care plan section)  Progress towards OT goals: Progressing toward goals     Plan      Co-evaluation                 AM-PAC OT "6 Clicks" Daily Activity     Outcome Measure   Help from another person eating meals?: A Little Help from another person taking care of personal grooming?: A Little Help from another person toileting, which includes using toliet, bedpan, or urinal?: A Lot Help from another person bathing (including washing, rinsing, drying)?: A Lot Help from another person to put on and taking off regular upper body clothing?: A Little Help from another person to put on and taking off regular lower body clothing?: A Lot 6 Click Score: 15    End of Session    OT Visit Diagnosis: Unsteadiness on feet (R26.81);Other abnormalities of gait and mobility (R26.89);Repeated falls (R29.6);History of falling (Z91.81);Muscle weakness (generalized) (M62.81);Low vision, both eyes (H54.2);Pain Pain - Right/Left: Left Pain - part of body: Hand   Activity Tolerance     Patient Left in bed;with call bell/phone within reach;with bed alarm set   Nurse Communication          Time: 9355-2174 OT Time Calculation (min): 30 min  Charges: OT General  Charges $OT Visit: 1 Visit OT Treatments $Self Care/Home Management : 23-37 mins  Janice Coffin, COTA/L 03/01/2019, 9:22 AM

## 2019-03-01 NOTE — Care Management Important Message (Signed)
Important Message  Patient Details  Name: Steven Frey. MRN: [id number] Date of Birth: [birth year]   Medicare Important Message Given:  Yes    Amarius Toto 03/01/2019, 2:11 PM

## 2019-03-01 NOTE — Progress Notes (Signed)
Physical Therapy Treatment Patient Details Name: Steven Frey. MRN: [id number] DOB: 1955-12-02 Today's Date: 03/01/2019    History of Present Illness Shivansh Hardaway. is a 63 y.o. male presenting with syncope as direct admission from [facility]. PMH is significant for HTN, Seizure Disorder, Depression, BPH, Hx CVA, Chronic Pancreatitis s/p pancreatic duct stenting, Alcoholic Cirrhosis, Hx Cardiac Arrest.    PT Comments    Pt continues to demonstrate instability with standing and ambulation. He appears to be at a very high risk for falls; therefore, continue to recommend pt d/c to SNF for further therapy services. Pt would continue to benefit from skilled physical therapy services at this time while admitted and after d/c to address the below listed limitations in order to improve overall safety and independence with functional mobility.    Follow Up Recommendations  SNF;Supervision/Assistance - 24 hour     Equipment Recommendations  None recommended by PT    Recommendations for Other Services       Precautions / Restrictions Precautions Precautions: Fall Restrictions Weight Bearing Restrictions: No    Mobility  Bed Mobility Overal bed mobility: Modified Independent                Transfers Overall transfer level: Needs assistance Equipment used: 1 person hand held assist Transfers: Sit to/from Stand Sit to Stand: Min assist         General transfer comment: Min a for inital standing balance   Ambulation/Gait Ambulation/Gait assistance: Min guard;Min assist Gait Distance (Feet): 20 Feet Assistive device: 1 person hand held assist Gait Pattern/deviations: Step-to pattern;Step-through pattern;Decreased step length - right;Decreased step length - left;Decreased stride length Gait velocity: decreased   General Gait Details: pt ambulated from his bed to the bathroom as he felt he needed to have a BM; pt with modest instability with very short step lengths and  narrow BoS   Stairs             Wheelchair Mobility    Modified Rankin (Stroke Patients Only)       Balance Overall balance assessment: Needs assistance Sitting-balance support: Feet supported Sitting balance-Leahy Scale: Good     Standing balance support: During functional activity Standing balance-Leahy Scale: Fair                              Cognition Arousal/Alertness: Awake/alert Behavior During Therapy: WFL for tasks assessed/performed Overall Cognitive Status: History of cognitive impairments - at baseline                                 General Comments: cognition WFL for general conversation and able to follow commands      Exercises      General Comments        Pertinent Vitals/Pain Pain Assessment: Faces Faces Pain Scale: Hurts a little bit Pain Location: L hand Pain Descriptors / Indicators: Sore Pain Intervention(s): Monitored during session;Repositioned    Home Living                      Prior Function            PT Goals (current goals can now be found in the care plan section) Acute Rehab PT Goals PT Goal Formulation: With patient Time For Goal Achievement: 03/13/19 Potential to Achieve Goals: Fair Progress towards PT goals: Progressing toward goals  Frequency    Min 3X/week      PT Plan Current plan remains appropriate    Co-evaluation              AM-PAC PT "6 Clicks" Mobility   Outcome Measure  Help needed turning from your back to your side while in a flat bed without using bedrails?: A Little Help needed moving from lying on your back to sitting on the side of a flat bed without using bedrails?: A Little Help needed moving to and from a bed to a chair (including a wheelchair)?: A Little Help needed standing up from a chair using your arms (e.g., wheelchair or bedside chair)?: A Little Help needed to walk in hospital room?: A Little Help needed climbing 3-5 steps with a  railing? : A Lot 6 Click Score: 17    End of Session   Activity Tolerance: Patient tolerated treatment well Patient left: with call bell/phone within reach;Other (comment)(seated on toilet in bathroom to attempt to have BM) Nurse Communication: Mobility status PT Visit Diagnosis: Unsteadiness on feet (R26.81)     Time: 1346-1400 PT Time Calculation (min) (ACUTE ONLY): 14 min  Charges:  $Gait Training: 8-22 mins                     Sherie Don, [place], DPT  Acute Rehabilitation Services Pager [phone] Office [place] 03/01/2019, 2:58 PM

## 2019-03-01 NOTE — Progress Notes (Signed)
DISCHARGE NOTE SNF [name]. to be discharged Skilled nursing facility per MD order. Patient verbalized understanding.  Skin clean, dry and intact without evidence of skin break down, no evidence of skin tears noted. IV catheter discontinued intact. Site without signs and symptoms of complications. Dressing and pressure applied. Pt denies pain at the site currently. No complaints noted.  Patient free of lines, drains, and wounds.   Discharge packet assembled. An After Visit Summary (AVS) was printed and given to the EMS personnel. Patient escorted via stretcher and discharged to [facility] via ambulance. Report called to accepting facility; all questions and concerns addressed.   Dolores Hoose, RN

## 2019-03-01 NOTE — TOC Transition Note (Signed)
Transition of Care [facility]) - CM/SW Discharge Note   Patient Details  Name: Steven Frey. MRN: [id number] Date of Birth: Feb 26, 1956  Transition of Care [facility]) CM/SW Contact:  Sable Feil, LCSW Phone Number: 03/01/2019, 5:10 PM   Clinical Narrative:  Patient discharged to [facility], transported by ambulance. Daughters informed by patient of discharge. Discharge clinicals transmitted to facility.   Final next level of care: Skilled Nursing Facility([facility]) Barriers to Discharge: No Barriers Identified   Patient Goals and CMS Choice Patient states their goals for this hospitalization and ongoing recovery are:: Patient stated that he wants to "get back on his feet" CMS Medicare.gov Compare Post Acute Care list provided to:: Patient Choice offered to / list presented to : Patient  Discharge Placement   Existing PASRR number confirmed : 02/28/19          Patient chooses bed at: [facility] Patient to be transferred to facility by: Ambulance Name of family member notified: Patient talked with his daughters by phone while CSW in room and informed them of discharge and if they could assist with his admissions paperwork at SNF Patient and family notified of of transfer: 03/01/19  Discharge Plan and Services In-house Referral: Clinical Social Work Discharge Planning Services: Other - See comment(SNF) Post Acute Care Choice: [place]          DME Arranged: N/A                    Social Determinants of Health (SDOH) Interventions  No SDOH interventions needed at discharge   Readmission Risk Interventions No flowsheet data found.

## 2019-03-06 DIAGNOSIS — E118 Type 2 diabetes mellitus with unspecified complications: Secondary | ICD-10-CM | POA: Diagnosis not present

## 2019-03-06 DIAGNOSIS — R55 Syncope and collapse: Secondary | ICD-10-CM | POA: Diagnosis not present

## 2019-03-06 DIAGNOSIS — G40309 Generalized idiopathic epilepsy and epileptic syndromes, not intractable, without status epilepticus: Secondary | ICD-10-CM | POA: Diagnosis not present

## 2019-03-06 DIAGNOSIS — S61412D Laceration without foreign body of left hand, subsequent encounter: Secondary | ICD-10-CM | POA: Diagnosis not present

## 2019-03-08 ENCOUNTER — Inpatient Hospital Stay: Payer: Medicare Other | Admitting: Family Medicine

## 2019-03-11 ENCOUNTER — Other Ambulatory Visit: Payer: Self-pay | Admitting: *Deleted

## 2019-03-11 DIAGNOSIS — N400 Enlarged prostate without lower urinary tract symptoms: Secondary | ICD-10-CM | POA: Diagnosis not present

## 2019-03-11 DIAGNOSIS — G40309 Generalized idiopathic epilepsy and epileptic syndromes, not intractable, without status epilepticus: Secondary | ICD-10-CM | POA: Diagnosis not present

## 2019-03-11 DIAGNOSIS — R55 Syncope and collapse: Secondary | ICD-10-CM | POA: Diagnosis not present

## 2019-03-11 DIAGNOSIS — S61412D Laceration without foreign body of left hand, subsequent encounter: Secondary | ICD-10-CM | POA: Diagnosis not present

## 2019-03-11 NOTE — Patient Outreach (Addendum)
Member screened for potential [facility] Care Management services. Confirmed in [place] that Steven Frey remains at [facility] SNF for rehab therapy.  Collaboration with [facility] UM RN. Writer made aware that Steven Frey likely to discharge soon. He will return home with his father at discharge.  Writer attempted to outreach to member on telephone number in system. However, it was his home phone. Writer made aware that member does not have a cell phone. Writer also attempted to call [facility] main line and the phone just rang with no answer.   Will plan to make referral for [facility] for complex case management upon SNF discharge. Steven Frey has multiple co-morbidities, per chart, to include COPD, alcoholic cirrhosis, ETOH,  recent dx DM, seizures, syncopal episodes. Per chart records, questionable medication adherence with Keppra.   Will continue to follow and make appropriate [facility] Care Management referral upon discharge from [facility] SNF.    Marthenia Rolling, MSN-Ed, RN,BSN [place] Acute Care Coordinator [phone]

## 2019-03-14 ENCOUNTER — Other Ambulatory Visit: Payer: Self-pay | Admitting: *Deleted

## 2019-03-14 DIAGNOSIS — I10 Essential (primary) hypertension: Secondary | ICD-10-CM

## 2019-03-14 NOTE — Patient Outreach (Signed)
Confirmed with [facility] UM RN that member discharged from [facility] SNF.  Telephone call made to Mr. Sansom at [phone]. Patient identifiers confirmed. Explained [place] Management program services. Mr. Wegner reports he was just recently discharged. He is agreeable to [place] Management services. However, he asks that contact is made next week since " I am just getting home and I need to get settled."   Mr. Barga reports he will have trouble affording his medications after the medications he received from SNF run out. He is agreeable to [facility] Pharmacist referral for medication affordability. He states the recent hospital discharge summary medications are the same as what he is taking.   When asked about home health. Mr. Penson endorses that " I did not need home health."  He reports he lives with his father and his daughter recently moved out. Denies having any concerns with transportation or meals.  Mr. Mayorga confirms that the best number to call him is on his home phone at [phone].   Mr. Jacob also reports he was recently diagnosed DM. He reports that he will be appreciative of any DM education and information.  Will make [place] Management referral for [facility] RNCM and [facility] Pharmacist.   Marthenia Rolling, MSN-Ed, RN,BSN [place] Acute Care Coordinator [phone]

## 2019-03-18 ENCOUNTER — Other Ambulatory Visit: Payer: Self-pay | Admitting: Pharmacist

## 2019-03-18 ENCOUNTER — Other Ambulatory Visit: Payer: Self-pay | Admitting: Pharmacy Technician

## 2019-03-18 ENCOUNTER — Other Ambulatory Visit: Payer: Self-pay | Admitting: Family Medicine

## 2019-03-18 ENCOUNTER — Other Ambulatory Visit: Payer: Self-pay

## 2019-03-18 NOTE — Patient Outreach (Addendum)
[facility] [facility]) Care Management  03/18/2019  Jasmine Maceachern. 1955-12-13 [id number]   Unsuccessful Outreach x1  Received referral from [name], [place] on 03-15-2019 requesting to follow up with member for Complex CM COPD/PNA. Member with one hospitalization in last 6 months. Last admission from 02-26-2019 to 03-01-2019 due to c/o syncope with hand laceration requiring sutures and discharged to SNF. Member recently discharged from [facility] to home. RN CM will complete TOC.  COPD, alcoholic cirrhosis, ETOH, chronic pancreatitis s/p stenting, V. Fib cardiac arrest June 2011, CVA 2013, seizure disorder, syncopal episodes, macrocytic anemia; BPH; newly diagnosed DM with A1c goal less than 8 per last hospital visit notation, 50 lbs weight loss in last year.   Called member at preferred number and phone continuously ringing then asks for remote access code. Unable to leave HIPPA compliant voice mail message.  Will call member within 3-4 business days. Will send Unsuccessful Outreach Letter with pamphlet and [facility] nurse-on-call magnet.   [name], RN-BSN  [facility] Care Management  Community Care Management Coordinator  [phone] [email or web address]

## 2019-03-18 NOTE — Patient Outreach (Signed)
[place] [facility]) Care Management  03/18/2019  Eron Staat. 28-Mar-1956 [id number]                          Medication Assistance Referral  Referral From: [place]  Medication/Company: Creon Durenda Hurt Patient application portion:  Mailed Provider application portion: Faxed  to Dr. Ardelia Mems   Follow up:  Will follow up with patient in 7-10 business days to confirm application(s) have been received.  Maud Deed Chana Bode [place]ley Certified Pharmacy Technician [place] Management Direct Dial:[phone]

## 2019-03-18 NOTE — Patient Outreach (Signed)
[place] [facility]) Care Management  [place]   03/18/2019  Lain Tetterton. 04/21/1956 [id number]  Reason for referral: Medication Assistance  Referral source: [facility] Inpatient Liaison Current insurance: [facility] Medicare RX Saver [place] [id number]  PMHx includes but not limited to:  HTN, seizure disorder, depression, polysubstance abuse, alcoholic cirrhosis, hx CVA, cardiac arrest, chronic pancreatitis s/p duct stents, BPH, syncopal events, medication non-compliance, recent hospitalization for syncopal episodes and hand laceration, also found to have new diagnosis of T2DM, discharged to SNF for rehab, now back at home.   Outreach:  Successful telephone call with Mr. Winterhalter.  HIPAA identifiers verified.   Subjective:  Patient reports difficulty paying for Keppra, Creon.  [facility] but is not sure what the co-pay is for these medications.  He states he has medications from SNF therefore has not needed to refill any medications from pharmacy yet.    \Objective: The 10-year ASCVD risk score Mikey Bussing DC Jr., et al., 2013) is: 15.3%   Values used to calculate the score:     Age: 63 years     Sex: Male     Is Non-Hispanic African American: Yes     Diabetic: No     Tobacco smoker: Yes     Systolic Blood Pressure: 950 mmHg     Is BP treated: Yes     HDL Cholesterol: 96 mg/dL     Total Cholesterol: 136 mg/dL  Lab Results  Component Value Date   CREATININE 0.83 02/28/2019   CREATININE 0.77 02/27/2019   CREATININE 0.78 02/26/2019    Lab Results  Component Value Date   HGBA1C 7.9 (H) 02/26/2019    Lipid Panel     Component Value Date/Time   CHOL 136 02/26/2019 1624   CHOL 128 04/27/2018 1054   TRIG 68 02/26/2019 1624   HDL 96 02/26/2019 1624   HDL 68 04/27/2018 1054   CHOLHDL 1.4 02/26/2019 1624   VLDL 14 02/26/2019 1624   LDLCALC 26 02/26/2019 1624   LDLCALC 36 04/27/2018 1054    BP Readings from Last 3 Encounters:  03/01/19 117/79   02/26/19 118/62  04/27/18 130/72    Allergies  Allergen Reactions  . Ace Inhibitors Anaphylaxis and Hives    Other reaction(s): Other (See Comments) On MAR   . Azithromycin Anaphylaxis and Swelling    Throat swelling, body swelling  . Lisinopril Anaphylaxis, Hives and Swelling    REACTION: facial/neck edema    Medications Reviewed Today    Reviewed by Daymon Larsen, CPhT (Pharmacy Technician) on 02/27/19 at [place] List Status: Complete  Medication Order Taking? Sig Documenting Provider Last Dose Status Informant  acetaminophen (TYLENOL) 500 MG tablet [id number] Yes Take 500 mg by mouth 2 (two) times daily as needed for mild pain or headache.  [provider] 02/26/2019 Active Self  amLODipine (NORVASC) 5 MG tablet [id number] Yes TAKE 1 TABLET BY MOUTH EVERY DAY  Patient taking differently:  Take 5 mg by mouth daily.    Leeanne Rio, MD 02/26/2019 Active Self  ASPIRIN ADULT LOW STRENGTH 81 MG EC tablet [id number] Yes TAKE 1 TABLET BY MOUTH EVERY DAY  Patient taking differently:  Take 81 mg by mouth daily.    Leeanne Rio, MD 02/26/2019 Active Self  atorvastatin (LIPITOR) 40 MG tablet [id number] Yes TAKE 1 TABLET BY MOUTH EVERY DAY  Patient taking differently:  Take 40 mg by mouth daily at 6 PM.    Leeanne Rio, MD  02/26/2019 Active Self  carvedilol (COREG) 3.125 MG tablet [id number] Yes TAKE 1 TABLET BY MOUTH TWICE A DAY WITH A MEAL  Patient taking differently:  Take 3.125 mg by mouth 2 (two) times daily with a meal.    Leeanne Rio, MD 02/26/2019 5-7pm Active Self  cephALEXin (KEFLEX) 500 MG capsule [id number] Yes Take 1 capsule (500 mg total) by mouth 4 (four) times daily for 5 days. Caroline More, DO 02/26/2019 Active Self  [facility] B-1 100 MG tablet [id number] Yes TAKE 1 TABLET BY MOUTH EVERY DAY  Patient taking differently:  Take 100 mg by mouth daily.    Leeanne Rio, MD 02/26/2019 Active Self  [facility] B-12 1000 MCG TBCR [id number] Yes TAKE 1  TABLET (1,000 MCG TOTAL) BY MOUTH DAILY. Leeanne Rio, MD 05/19/1750 Active Self  folic acid (FOLVITE) 1 MG tablet [id number] Yes TAKE 1 TABLET BY MOUTH EVERY DAY McDiarmid, Blane Ohara, MD 02/26/2019 Active Self  HYDROcodone-acetaminophen (NORCO) 5-325 MG tablet [id number] Yes Take 1 tablet by mouth every 6 (six) hours as needed for moderate pain. Caroline More, DO 02/26/2019 Active Self  levETIRAcetam (KEPPRA) 1000 MG tablet [id number] Yes TAKE 1 TABLET BY MOUTH TWICE A DAY  Patient taking differently:  Take 1,000 mg by mouth 2 (two) times daily.    Leeanne Rio, MD 02/26/2019 Active Self  lipase/protease/amylase (CREON) 12000 units CPEP capsule [id number] Yes TAKE 1 CAPSULE BY MOUTH 3 TIMES A DAY BEFORE MEALS  Patient taking differently:  Take 12,000 Units by mouth 3 (three) times daily before meals.    Leeanne Rio, MD 02/26/2019 Active Self  sertraline (ZOLOFT) 100 MG tablet [id number] Yes TAKE 1 TABLET BY MOUTH EVERY DAY  Patient taking differently:  Take 100 mg by mouth daily.    Leeanne Rio, MD 02/26/2019 Active Self  tamsulosin (FLOMAX) 0.4 MG CAPS capsule [id number] Yes TAKE 1 CAPSULE BY MOUTH EVERY DAY  Patient taking differently:  Take 0.4 mg by mouth daily.    Leeanne Rio, MD 02/26/2019 Active Self  thiamine 100 MG tablet [id number] Yes Take 100 mg by mouth daily. [provider] 02/26/2019 Active Self          Assessment: Drugs sorted by system:  Neurologic/Psychologic: levetiracetam, sertraline  Hematologic: aspirin 71m  Cardiovascular: amlodipine, atorvastatin, carvedilol  Gastrointestinal: Creon  Endocrine: metformin  Topical: nicotine patch  Pain: acetaminophen  Genitourinary: tamsulosin  Vitamins/Minerals/Supplements: vitamin B1, vitamin BF79 folic acid   Medication Assistance Findings:  Medication assistance needs identified: Keppra, Creon   [facility]   -Creon - last filled 08/2018, co-pay $370.98  for 90 DS -Keppra last filled 02/23/2019, co-pay $53.63 for 90 day supply, no refills.  -Pharmacist will request refill on Keppra.  Patient should have enough medication to last for at least 2 more months.   Patient may have not paid $435 deductible which is why co-pays are not reflective of formulary.  Per formulary, once deductible met, Keppra co-pay = $2, Creon co-pay= $28.    Extra Help: Patient may be eligible for Partial Extra Help based on reported income.  Unable to apply over the phone with patient today.    Patient Assistance Programs: Creon made by [place] Income requirement met: Yes o Out-of-pocket prescription expenditure met:   Not Applicable - Patient has met application requirements to apply for this patient assistance program.    - Prescriber: Dr. BChrisandra Netters -No patient assistance program available for Keppra.  -RX Outreach has  Keppra $50 / 90 day supply which is very similar to current co-pay.     Plan: . I will route patient assistance letter to [place] technician who will coordinate patient assistance program application process for medications listed above.  [facility] pharmacy technician will assist with obtaining all required documents from both patient and provider(s) and submit application(s) once completed.   . I will f/u with patient again later this week to apply for Extra Help L[place] program.    Ralene Bathe, PharmD, [place] [phone]

## 2019-03-19 ENCOUNTER — Ambulatory Visit: Payer: Self-pay | Admitting: Pharmacist

## 2019-03-19 ENCOUNTER — Other Ambulatory Visit: Payer: Self-pay | Admitting: Pharmacist

## 2019-03-19 NOTE — Patient Outreach (Signed)
[facility] [facility]) Care Management  [place] 03/19/2019  Steven Frey. 1955/10/15 [id number]  Reason for call: assist with applying for Extra Help LIS  Outreach:  Unsuccessful telephone call attempt #1 to patient. Unable to leave message  Plan:  -I will make another outreach attempt to patient within 3-4 business days.    Ralene Bathe, PharmD, [place] [phone]

## 2019-03-21 ENCOUNTER — Other Ambulatory Visit: Payer: Self-pay | Admitting: Pharmacist

## 2019-03-21 ENCOUNTER — Ambulatory Visit: Payer: Self-pay | Admitting: Pharmacist

## 2019-03-21 NOTE — Patient Outreach (Signed)
[place] [facility]) Care Management  [place] 03/21/2019  Ilia Engelbert. 01-Jul-1956 [id number]  Reason for call: assist with applying for Extra Help LIS  Outreach:  Unsuccessful telephone call attempt #2 to patient. Unable to leave message  Plan:  -I will make another outreach attempt to patient within 3-4 business days.    Ralene Bathe, PharmD, [place] [phone]

## 2019-03-22 ENCOUNTER — Other Ambulatory Visit: Payer: Self-pay

## 2019-03-22 MED ORDER — LEVETIRACETAM 500 MG PO TABS: 500.0000 mg | ORAL_TABLET | Freq: Two times a day (BID) | ORAL | 3 refills | Status: DC

## 2019-03-22 NOTE — Patient Outreach (Signed)
[facility]) Care Management  03/22/2019  Steven Frey. 23-Dec-1955 [id number]   Unsuccessful Outreach x2  Received referral from [name], [place] on 03-15-2019 requesting to follow up with member for Complex CM COPD/PNA. Member with one hospitalization in last 6 months. Last admission from 02-26-2019 to 03-01-2019 due to c/o syncope with hand laceration requiring sutures and discharged to SNF. Member recently discharged from [facility] to home. RN CM will complete TOC.  COPD, alcoholic cirrhosis, ETOH, chronic pancreatitis s/p stenting, V. Fib cardiac arrest June 2011, CVA 2013, seizure disorder, syncopal episodes, macrocytic anemia; BPH; newly diagnosed DM with A1c goal less than 8 per last hospital visit notation, 50 lbs weight loss in last year.   Called member at preferred number and phone continuously ringing then asks for remote access code. Unable to leave HIPPA compliant voice mail message.  Will call member within 3-4 business days.   Steven Mola "[name]" [name], RN-BSN  [facility] Care Management  Community Care Management Coordinator  [phone] [email or web address]

## 2019-03-26 ENCOUNTER — Ambulatory Visit: Payer: Self-pay | Admitting: Pharmacist

## 2019-03-26 ENCOUNTER — Other Ambulatory Visit: Payer: Self-pay | Admitting: Pharmacist

## 2019-03-26 NOTE — Patient Outreach (Signed)
[facility]) Care Management  [place] 03/26/2019  Steven Frey. [birth year] [id number]  Reason for call: assist with applying for Extra Help LIS  Successful call with [name].  [name] reports he has not had a chance to check his mailbox to see if Creon patient assistance program application has arrived.  He will try to check in the next few days.  He is agreeable to also apply for Extra Help LIS over the phone today.  He may be eligible for partial extra help based on reported income.    Plan: -Assisted patient with submitting Partial Extra Help LIS application online -Will f/u in 2-4 weeks regarding decision  Ralene Bathe, PharmD, [place] [phone]

## 2019-03-28 ENCOUNTER — Other Ambulatory Visit: Payer: Self-pay

## 2019-03-28 NOTE — Patient Outreach (Signed)
[facility]) Care Management  03/28/2019   Steven Frey. [birth year] [id number]  Subjective:  Today's Vitals   03/28/19 1538  PainSc: 7     Member states left hand pain exacerbated with movement due to post laceration with removal of stitches. Member states tylenol is effective pain medication and states wound closed with scabs.  Objective:  Negative SARS-CoV-2 results on 02-28-2019. Member newly diagnosed DM with Hgb A1c 7.9 on 02-26-2019 with MD goal <8.0 on metformin.   Member states he does not have DM equipment as he does not have order to check glucose levels.   Current Medications:  Current Outpatient Medications  Medication Sig Dispense Refill  . acetaminophen (TYLENOL) 500 MG tablet Take 500 mg by mouth 2 (two) times daily as needed for mild pain or headache.     Marland Kitchen amLODipine (NORVASC) 5 MG tablet TAKE 1 TABLET BY MOUTH EVERY DAY (Patient taking differently: Take 5 mg by mouth daily. ) 30 tablet 1  . ASPIRIN ADULT LOW STRENGTH 81 MG EC tablet TAKE 1 TABLET BY MOUTH EVERY DAY (Patient taking differently: Take 81 mg by mouth daily. ) 90 tablet 3  . atorvastatin (LIPITOR) 40 MG tablet TAKE 1 TABLET BY MOUTH EVERY DAY (Patient taking differently: Take 40 mg by mouth daily at 6 PM. ) 90 tablet 3  . carvedilol (COREG) 3.125 MG tablet TAKE 1 TABLET BY MOUTH TWICE A DAY WITH A MEAL (Patient taking differently: Take 3.125 mg by mouth 2 (two) times daily with a meal. ) 180 tablet 1  . [employer] B-1 100 MG tablet TAKE 1 TABLET BY MOUTH EVERY DAY (Patient taking differently: Take 100 mg by mouth daily. ) 100 tablet 3  . [employer]S B-12 1000 MCG TBCR TAKE 1 TABLET (1,000 MCG TOTAL) BY MOUTH DAILY. 90 tablet 0  . folic acid (FOLVITE) 1 MG tablet TAKE 1 TABLET BY MOUTH EVERY DAY 90 tablet 3  . levETIRAcetam (KEPPRA) 500 MG tablet Take 1 tablet (500 mg total) by mouth 2 (two) times daily. 60 tablet 3  . lipase/protease/amylase (CREON) 12000 units CPEP capsule TAKE 1 CAPSULE BY MOUTH 3  TIMES A DAY BEFORE MEALS 270 capsule 0  . metFORMIN (GLUCOPHAGE) 500 MG tablet Take 1 tablet (500 mg total) by mouth daily with breakfast. 30 tablet 0  . sertraline (ZOLOFT) 100 MG tablet TAKE 1 TABLET BY MOUTH EVERY DAY (Patient taking differently: Take 100 mg by mouth daily. ) 90 tablet 0  . tamsulosin (FLOMAX) 0.4 MG CAPS capsule TAKE 1 CAPSULE BY MOUTH EVERY DAY (Patient taking differently: Take 0.4 mg by mouth daily. ) 90 capsule 0  . nicotine (NICODERM CQ - DOSED IN MG/24 HR) 7 mg/24hr patch Place 1 patch (7 mg total) onto the skin daily. (Patient not taking: Reported on 03/18/2019) 28 patch 0   No current facility-administered medications for this visit.     Functional Status:  In your present state of health, do you have any difficulty performing the following activities: 02/26/2019 02/26/2019  Hearing? - Y  Vision? - -  Difficulty concentrating or making decisions? - N  Walking or climbing stairs? Y Y  Dressing or bathing? - Y  Doing errands, shopping? - Y  Some recent data might be hidden    Fall/Depression Screening: Fall Risk  03/28/2019 02/26/2019 04/27/2018  Falls in the past year? 1 1 No  Number falls in past yr: 1 1 -  Comment member states he fell because he quit taking his seizure  medication and he thought it was ok to quit - -  Injury with Fall? 1 1 -  Comment - - -  Risk Factor Category  - - -  Risk for fall due to : History of fall(s);Other (Comment) Impaired mobility -  Risk for fall due to: Comment non-compliance to seizure medication - -  Follow up Falls evaluation completed;Education provided;Falls prevention discussed Falls prevention discussed;Falls evaluation completed -  Comment encouraged member to call PCP for post SNF follow up appointment and member states he will call next week - -   PHQ 2/9 Scores 02/26/2019 04/27/2018 10/20/2017 07/14/2017 06/02/2017 03/10/2016 11/12/2015  PHQ - 2 Score 1 0 0 0 0 0 0  PHQ- 9 Score - - - - - - -  Exception Documentation - - - -  - - -   THN CM Care Plan Problem One     Most Recent Value  Role Documenting the Problem One  Care Management [place] for Problem One  Active  THN Long Term Goal   Member will verbalize understanding of DM within next 60 days  THN Long Term Goal Start Date  03/28/19  Interventions for Problem One Long Term Goal  Discussed recent facility discharge medications  THN CM Short Term Goal #1   Member will call to schedule follow up appointment with PCP within next 7 days  THN CM Short Term Goal #1 Start Date  03/28/19  Interventions for Short Term Goal #1  Encouraged member to call and schedule PCP appointment  [facility] CM Short Term Goal #2   Member will state importance of compliance with medications within next 30 days  Interventions for Short Term Goal #2  Educated member concerning importance of compliance with taking medications as prescribed    [facility] CM Care Plan Problem Two     Most Recent Value  Care Plan Problem Two  Knowledge Deficit related to COVID 19  Role Documenting the Problem Two  Care Management [place] for Problem Two  Active  Interventions for Problem Two Long Term Goal   will send member written education/information related to [place] Term Goal  Member will not be hospitalized for next 45 days related to COVID-19 symptoms  THN Long Term Goal Start Date  03/28/19  THN CM Short Term Goal #1   Over the next 30 days, patient will verbalize basic understanding of COVID-19 impact on individual health and self-health management as evidenced by verbalization of basic understanding of COVID-19 as a viral disease, measures to prevent exposure, signs and symptoms, when to contact provider  [facility] CM Short Term Goal #1 Start Date  03/28/19  Interventions for Short Term Goal #2   Discussed importance of compliance with COVID-19 restrictions      Assessment: Received referral from [name], [place] on 03-15-2019 requesting to follow up with member for  Complex CM COPD/PNA. Member with one hospitalization in last 6 months. Last admission from 02-26-2019 to 03-01-2019 due to c/o syncope with hand laceration requiring sutures and discharged to SNF. Member recently discharged from [facility] to home. RN CM will complete TOC.  PMH: COPD, HTN, HLD, alcoholic cirrhosis, ETOH, chronic pancreatitis s/p stenting, V. Fib cardiac arrest June 2011, CVA 2013, Depression on Zoloft, seizure disorder on Keppra, syncopal episodes, macrocytic anemia on vitamin D62 and folic acid; BPH; newly diagnosed DM with A1c goal less than 8 per last hospital visit notation on metformin; 50 lbs weight loss in  last year. Member reports he has increased meal intake from 1 meal a day to 3 meals a day and tolerating well. Member states current use of marijuana, cigarette smoking, drinks beer. Education provided.  Received call from member and HIPPA identities verified. Introduced self to member and [facility] benefits explained. Member verbalized agreement with services.  Member stated that he lives with his father Steven Sanagustin Sr. and is able to bathe and dress self. Member stated his dad assists with meals, groceries, paying bills; his sister, brother, and daughter assist with transportation.  Member stated reason for hospitalization is because he had not experienced seizure activity in a long time and thought it was ok to stop seizure medication. Member stated when he recently experienced seizure, he received a laceration to his left hand that required stitches and stated he is unable to use his R hand as well.  Appointments: Member encouraged member to call PCP for post SNF follow up appointment and member states he will call next week. Last visit 02-26-2019 per member.  Steven Frey: Cardiologist- last visit 01-18-2019 and next visit annually due to partial blockage per member.  Dentist-none and reports missing teeth, no dentures.'  Dr. Krista Blue at [facility] unable to state  appointment date.  Eye: unable to state name of provider, last visit Feb. 2020, reports cataracts and reading glasses.  Urologist: visited last year per member due to penile erection problems per member.  Fall: member states falls/syncope due to non-compliance with taking seizure medication. Member states he thought "it was ok to quit since I had not had a seizure in a long time".  Meals: member states he now has appetite and has increased from 1 meal a day to 3 meals a day. Member newly DM diagnosis; however, states no specific diet at this time.  Depression: Member denies feelings of sadness, little interest in doing things. Member denies thoughts of harming self.   Plan: Will send 22 barrier letter. Will send member welcome and successful outreach letter, advance directives packet, [facility] calendar and nurse on call magnet. Will follow up with member next week as requested by member.  Steven Mola "[name]" [name], RN-BSN  [facility] Care Management  Community Care Management Coordinator  [phone] [email or web address]

## 2019-04-01 ENCOUNTER — Other Ambulatory Visit: Payer: Self-pay | Admitting: Family Medicine

## 2019-04-02 ENCOUNTER — Other Ambulatory Visit: Payer: Self-pay

## 2019-04-04 ENCOUNTER — Other Ambulatory Visit: Payer: Self-pay

## 2019-04-04 NOTE — Patient Outreach (Addendum)
[facility]) Care Management  04/04/2019  Steven Frey. 06-Oct-1956 [id number]  Unsuccessful Outreach x1.   Received referral from [name], [name] on 03-15-2019 requesting to follow up with member for Complex CM COPD/PNA. Member with one hospitalization in last 6 months. Last admission from 02-26-2019 to 03-01-2019 due to c/o syncope with hand laceration requiring sutures and discharged to SNF. Member recently discharged from [facility] to home. RN CM will complete TOC.Received referral from [name], [name] on 03-15-2019 requesting to follow up with member for Complex CM COPD/PNA. Member with one hospitalization in last 6 months. Last admission from 02-26-2019 to 03-01-2019 due to c/o syncope with hand laceration requiring sutures and discharged to SNF. Member recently discharged from [facility] to home. RN CM will complete TOC.  Called member at preferred number to complete initial assessment as agreed with member and no answer. Called secondary number and HIPPA compliant voicemail message left requesting return call when available.   Will call member in 3-4 business days if no return call.   [name], RN-BSN  [facility] Care Management  Community Care Management Coordinator  [phone] [email or web address]

## 2019-04-05 MED ORDER — METFORMIN HCL 500 MG PO TABS: 500.0000 mg | ORAL_TABLET | Freq: Every day | ORAL | 0 refills | Status: DC

## 2019-04-05 NOTE — Telephone Encounter (Signed)
Attempted to call patient to schedule an appointment.  No answer and no voice mail.  Steven Frey, [place]

## 2019-04-05 NOTE — Telephone Encounter (Signed)
Please ask patient to schedule follow up with me in the office Leeanne Rio, MD

## 2019-04-08 ENCOUNTER — Telehealth: Payer: Self-pay

## 2019-04-08 NOTE — Telephone Encounter (Signed)
Attempted to call patient again to schedule an appointment.  No answer and no voice mail.  Steven Frey, [place]

## 2019-04-10 ENCOUNTER — Other Ambulatory Visit: Payer: Self-pay

## 2019-04-10 NOTE — Patient Outreach (Addendum)
[facility]) Care Management  04/10/2019  Ethin Drummond. 1955/12/03 [id number]  Unsuccessful x2 for [facility]  Called member at preferred number in order to complete initial assessment and member answered the phone. HIPPA identities verified.   Member stated that now is not a good time to complete initial assessment and prefers to schedule for a later date and time.   Will call member within 10 days to complete initial assessment and member verbalized agreement. Unsuccessful Outreach Letter sent.  [name], RN-BSN  [facility] Care Management  Community Care Management Coordinator  [phone] [email or web address]

## 2019-04-15 ENCOUNTER — Other Ambulatory Visit: Payer: Self-pay | Admitting: Pharmacy Technician

## 2019-04-15 ENCOUNTER — Ambulatory Visit: Payer: Medicare Other

## 2019-04-15 ENCOUNTER — Other Ambulatory Visit: Payer: Self-pay

## 2019-04-15 NOTE — Patient Outreach (Addendum)
[facility]) Care Management  04/15/2019  Steven Frey. August 13, 1956 [id number]    Unsuccessful call #1 placed to patient regarding patient assistance application(s) for Creon , HIPAA compliant voicemail left.   Follow up:  Will make 2nd call attempt in 3-5 business days if call has not been returned.  Maud Deed Chana Bode [place] Certified Pharmacy Technician [place] Management Direct Dial:[phone]   ADDENDUM 12:50 pm  Incoming call from patient, HIPAA identifiers verified. Patient states that he has not received the patient assistance application for Creon that was mailed to him. Confirmed patients mailing address and will prepare application to be mailed out to him again.  Will follow up with patient in 7-10 business days to confirm application has been received.  Maud Deed Chana Bode [place] Certified Pharmacy Technician [place] Management Direct Dial:[phone]

## 2019-04-15 NOTE — Patient Outreach (Addendum)
[facility]) Care Management  04/15/2019  Steven Frey. [birth year] [id number]  Unsuccessful Outreach x 3.  Called member at for [facility] follow up and complete initial assessment at preferred number and no answer. Called member at secondary umber and no answer. HIPPA compliant voice mail message left introducing self and requested return call when available.   Will call member within 3-4 business days.  [name], RN-BSN  [facility] Care Management  Community Care Management Coordinator  [phone] [email or web address]

## 2019-04-19 ENCOUNTER — Other Ambulatory Visit: Payer: Self-pay

## 2019-04-19 NOTE — Patient Outreach (Signed)
[facility]) Care Management  04/19/2019  [name]. 1955-10-30 [id number]  Updated previous TOC unsuccessful outreach attempts x 3 on 04-04-2019; 04-10-2019; 04-15-2019. No return calls from member. Member on hold 10 days according to [facility] workflow.  Will close case. Will send discipline closure letter to PCP. Will update Ralene Bathe, [facility] RPH and Etter Sjogren, [facility] CPhT of case closure. Will not make inactive due to other [facility] disciplines following up with member.   [name], RN-BSN  [facility] Care Management  Community Care Management Coordinator  [phone] [email or web address]

## 2019-04-29 ENCOUNTER — Ambulatory Visit: Payer: Self-pay | Admitting: Pharmacist

## 2019-04-29 ENCOUNTER — Other Ambulatory Visit: Payer: Self-pay | Admitting: Pharmacist

## 2019-04-29 NOTE — Patient Outreach (Signed)
[facility]) Care Management  [place]  04/29/2019  Steven Frey. [birth year] [id number]  Reason for call: f/u on Extra Help application / receipt of [employer] PAP application  Outreach:  Unsuccessful telephone call attempt #1 to patient.   HIPAA compliant voicemail left requesting a return call  Plan:  -I will make another outreach attempt to patient within 3-4 business days.    Ralene Bathe, PharmD, [place] [phone]

## 2019-05-01 ENCOUNTER — Other Ambulatory Visit: Payer: Self-pay | Admitting: Pharmacist

## 2019-05-01 NOTE — Patient Outreach (Signed)
[facility]) Care Management  [place]  05/01/2019  Steven Frey. [birth year] [id number]   Reason for call: f/u on Extra Help application / receipt of [employer] PAP applicaiton  Outreach:  Unsuccessful telephone call attempt #2 to patient.   HIPAA compliant voicemail left requesting a return call  Plan:  -I will make another outreach attempt to patient within 3-4 business days.    Ralene Bathe, PharmD, [place] [phone]

## 2019-05-03 ENCOUNTER — Ambulatory Visit: Payer: Self-pay | Admitting: Pharmacist

## 2019-05-07 ENCOUNTER — Other Ambulatory Visit: Payer: Self-pay | Admitting: Pharmacist

## 2019-05-07 ENCOUNTER — Ambulatory Visit: Payer: Self-pay | Admitting: Pharmacist

## 2019-05-07 NOTE — Patient Outreach (Signed)
[facility]) Care Management [place]  05/07/2019  Steven Frey. Mar 09, 1956 [id number]  Reason for call: f/u on Extra Help application / receipt of [employer] PAP applicaiton  Outreach:  Unsuccessful telephone call attempt #3 to patient. HIPAA compliant voicemail left requesting a return call  Plan:  -I will close [place] case at this time as I have been unable to establish and/or maintain contact with patient.  -I am happy to assist in the future as needed.    Ralene Bathe, PharmD, [place] [phone]

## 2019-05-17 ENCOUNTER — Other Ambulatory Visit: Payer: Self-pay | Admitting: Family Medicine

## 2019-05-17 NOTE — Telephone Encounter (Signed)
Red team, please contact patient and ask him to schedule a follow up visit with me Thanks Leeanne Rio, MD

## 2019-05-20 NOTE — Telephone Encounter (Signed)
LVM for patient to call office to schedule and appointment.  Steven Frey, [place]

## 2019-05-20 NOTE — Telephone Encounter (Signed)
Attempted to call pt no answer, just kept ringing. Will try again later. Deseree Kennon Holter, CMA

## 2019-05-23 ENCOUNTER — Other Ambulatory Visit: Payer: Self-pay | Admitting: Family Medicine

## 2019-06-18 ENCOUNTER — Other Ambulatory Visit: Payer: Self-pay | Admitting: Family Medicine

## 2019-06-24 ENCOUNTER — Ambulatory Visit (INDEPENDENT_AMBULATORY_CARE_PROVIDER_SITE_OTHER): Payer: Medicare Other | Admitting: Family Medicine

## 2019-06-24 ENCOUNTER — Other Ambulatory Visit: Payer: Self-pay

## 2019-06-24 ENCOUNTER — Encounter: Payer: Self-pay | Admitting: Family Medicine

## 2019-06-24 VITALS — BP 108/62 | HR 73 | Wt 115.0 lb

## 2019-06-24 DIAGNOSIS — Z23 Encounter for immunization: Secondary | ICD-10-CM

## 2019-06-24 DIAGNOSIS — R569 Unspecified convulsions: Secondary | ICD-10-CM

## 2019-06-24 DIAGNOSIS — E119 Type 2 diabetes mellitus without complications: Secondary | ICD-10-CM

## 2019-06-24 DIAGNOSIS — G40909 Epilepsy, unspecified, not intractable, without status epilepticus: Secondary | ICD-10-CM

## 2019-06-24 DIAGNOSIS — N429 Disorder of prostate, unspecified: Secondary | ICD-10-CM

## 2019-06-24 DIAGNOSIS — R55 Syncope and collapse: Secondary | ICD-10-CM | POA: Diagnosis not present

## 2019-06-24 DIAGNOSIS — K861 Other chronic pancreatitis: Secondary | ICD-10-CM | POA: Diagnosis not present

## 2019-06-24 DIAGNOSIS — R634 Abnormal weight loss: Secondary | ICD-10-CM

## 2019-06-24 DIAGNOSIS — F320 Major depressive disorder, single episode, mild: Secondary | ICD-10-CM

## 2019-06-24 DIAGNOSIS — Z125 Encounter for screening for malignant neoplasm of prostate: Secondary | ICD-10-CM | POA: Diagnosis not present

## 2019-06-24 DIAGNOSIS — R931 Abnormal findings on diagnostic imaging of heart and coronary circulation: Secondary | ICD-10-CM | POA: Diagnosis not present

## 2019-06-24 LAB — POCT GLYCOSYLATED HEMOGLOBIN (HGB A1C): HbA1c, POC (controlled diabetic range): 7 % (ref 0.0–7.0)

## 2019-06-24 MED ORDER — SERTRALINE HCL 50 MG PO TABS: 150.0000 mg | ORAL_TABLET | Freq: Every day | ORAL | 1 refills | Status: DC

## 2019-06-24 NOTE — Patient Instructions (Signed)
Checking labwork today  Go up to 150mg  daily on zoloft. Sent in new prescription.  Flu shot today  Call eye doctor for an appointment and tell them you have diabetes  Try stopping creon and see how you do  Referring to neurologist for seizures Referring to heart doctor for abnormal echocardiogram (heart ultrasound) Referring to GI for colonoscopy since you've lost so much weight  Can take tylenol 500mg  three times daily   Follow up with me in 1 month, sooner if needed  Be well, Dr. Ardelia Mems

## 2019-06-24 NOTE — Progress Notes (Signed)
Date of Visit: 06/24/2019   HPI:  Steven Frey presents for follow up. I have not seen him since he was admitted to the hospital in May. He was discharged to a SNF and was there for several weeks.   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 Taking tylenol 500mg  twice daily. It helps when he takes it, but wears off.   Mood - taking sertraline 100mg  daily. Patient reports mood is "up and down", has "good days and bad days". Good days are more than bad days. Denies SI/HI. Thinks maybe he would benefit from higher dose of zoloft.  Seizures - taking keppra 500mg  twice daily. No recent seizures at all. Does not drive. Has not seen neurology in a long time.  Syncope - was evaluated in the hospital and cardiology did not think syncope was cardiogenic. Is past due for outpatient cardiology follow up. Had abnormal echo in the hospital - showed lipomatous and aneurysmal interatrial septum. No current cardiac symptoms.   Pancreatic insufficiency - on three times daily creon. Has been on this for a long time. Thinks he may want to try going without it, is not sure.  Weight loss - recent abnormal weight loss, has not tried to lose weight. Weight is actually up today from prior (was 108lb on 5/27, now 115lb). Last colonoscopy in Feb 2016, due for follow up in Feb 2021. Smoking 2-3 cigarettes per day currently. Smoked since age 15, never more than 2-4 cigs/day. No family history of prostate cancer. No recent PSA. Patient feels well overall (does not complain of cough, fever etc)  ROS: See HPI.  [place]: history of seizure disorder, cirrhosis, chronic pancreaittis, prior CVA, hyperlipidemia, hypertension   PHYSICAL EXAM: BP 108/62   Pulse 73   Wt 115 lb (52.2 kg)   SpO2 99%   BMI 17.49 kg/m  Gen: no acute distress, pleasant, cooeprative HEENT: normocephalic, atraumatic Heart: regular rate and rhythm, no murmur Lungs: clear to auscultation bilaterally, normal work of breathing   Neuro: alert grossly nonfocal, speech normal Ext: No appreciable lower extremity edema bilaterally, chronic R hand contracture without erythema or swelling  ASSESSMENT/PLAN:  Health maintenance:  -hep C screen today (elevated LFT in the hospital noted) -flu shot given today -check cmet & CBC as part of hospital follow up recommendations   Type 2 diabetes mellitus (HCC) A1c today 7.0, not on any medications. Continue off medications at present. Advised to get eye exam. Not able to give urine sample today for urine microalbumin.  Abnormal weight loss Recent weight loss of unclear etiology. Fortunately weight back up some today. Need to ensure current on age appropriate cancer screenings - refer for colonoscopy (will have him go a little early, not technically due until Feb 2021) - does not qualify for low dose CT lung cancer screening due to inadequate pack years, consider regular CT chest with contrast, hold for now - check PSA today with labs  Syncope and collapse Refer back to cardiology for eval of abnormal cardiac echo, past due for outpatient follow up with them  Chronic pancreatitis On creon for pancreatic insufficiency. Patient wants to consider trying without this medication. Reasonable to try. He will let me know if he has symptoms after stopping  Major depression, single episode Uncontrolled. Increase zoloft to 150mg  daily. Follow up in 1 month.   Seizure disorder ([place]) Stable on keppra. Referring back to neurology.   Hand pain - advised ok to go up on  tylenol to 500mg  three times daily   FOLLOW UP: Follow up in 1 mo for above issues Referring to GI for colonoscopy Referring to cardiology for abnormal echo Referring to neuro for prior stroke/seizures  Steven Frey, [place]

## 2019-06-25 LAB — CBC
Hematocrit: 35.2 % — ABNORMAL LOW (ref 37.5–51.0)
Hemoglobin: 11.6 g/dL — ABNORMAL LOW (ref 13.0–17.7)
MCH: 31.6 pg (ref 26.6–33.0)
MCHC: 33 g/dL (ref 31.5–35.7)
MCV: 96 fL (ref 79–97)
Platelets: 229 10*3/uL (ref 150–450)
RBC: 3.67 x10E6/uL — ABNORMAL LOW (ref 4.14–5.80)
RDW: 11.2 % — ABNORMAL LOW (ref 11.6–15.4)
WBC: 8.2 10*3/uL (ref 3.4–10.8)

## 2019-06-25 LAB — CMP14+EGFR
ALT: 23 IU/L (ref 0–44)
AST: 29 IU/L (ref 0–40)
Albumin/Globulin Ratio: 1.6 (ref 1.2–2.2)
Albumin: 4.2 g/dL (ref 3.8–4.8)
Alkaline Phosphatase: 70 IU/L (ref 39–117)
BUN/Creatinine Ratio: 12 (ref 10–24)
BUN: 10 mg/dL (ref 8–27)
Bilirubin Total: 0.6 mg/dL (ref 0.0–1.2)
CO2: 25 mmol/L (ref 20–29)
Calcium: 9.3 mg/dL (ref 8.6–10.2)
Chloride: 104 mmol/L (ref 96–106)
Creatinine, Ser: 0.85 mg/dL (ref 0.76–1.27)
GFR calc Af Amer: 108 mL/min/{1.73_m2} (ref >59)
GFR calc non Af Amer: 93 mL/min/{1.73_m2} (ref >59)
Globulin, Total: 2.7 g/dL (ref 1.5–4.5)
Glucose: 145 mg/dL — ABNORMAL HIGH (ref 65–99)
Potassium: 4.2 mmol/L (ref 3.5–5.2)
Sodium: 139 mmol/L (ref 134–144)
Total Protein: 6.9 g/dL (ref 6.0–8.5)

## 2019-06-25 LAB — PSA: Prostate Specific Ag, Serum: 1.1 ng/mL (ref 0.0–4.0)

## 2019-06-25 LAB — HEPATITIS C ANTIBODY: Hep C Virus Ab: <0.1 s/co ratio (ref 0.0–0.9)

## 2019-06-27 DIAGNOSIS — R634 Abnormal weight loss: Secondary | ICD-10-CM | POA: Insufficient documentation

## 2019-06-27 NOTE — Assessment & Plan Note (Signed)
Refer back to cardiology for eval of abnormal cardiac echo, past due for outpatient follow up with them

## 2019-06-27 NOTE — Assessment & Plan Note (Signed)
Recent weight loss of unclear etiology. Fortunately weight back up some today. Need to ensure current on age appropriate cancer screenings - refer for colonoscopy (will have him go a little early, not technically due until Feb 2021) - does not qualify for low dose CT lung cancer screening due to inadequate pack years, consider regular CT chest with contrast, hold for now - check PSA today with labs

## 2019-06-27 NOTE — Assessment & Plan Note (Signed)
On creon for pancreatic insufficiency. Patient wants to consider trying without this medication. Reasonable to try. He will let me know if he has symptoms after stopping

## 2019-06-27 NOTE — Assessment & Plan Note (Signed)
Uncontrolled. Increase zoloft to 150mg  daily. Follow up in 1 month.

## 2019-06-27 NOTE — Assessment & Plan Note (Signed)
Stable on keppra. Referring back to neurology.

## 2019-06-27 NOTE — Assessment & Plan Note (Signed)
A1c today 7.0, not on any medications. Continue off medications at present. Advised to get eye exam. Not able to give urine sample today for urine microalbumin.

## 2019-06-28 ENCOUNTER — Encounter: Payer: Self-pay | Admitting: Internal Medicine

## 2019-06-28 ENCOUNTER — Encounter: Payer: Self-pay | Admitting: Family Medicine

## 2019-06-28 ENCOUNTER — Other Ambulatory Visit: Payer: Self-pay | Admitting: Family Medicine

## 2019-07-01 ENCOUNTER — Other Ambulatory Visit: Payer: Self-pay | Admitting: Family Medicine

## 2019-07-25 ENCOUNTER — Other Ambulatory Visit: Payer: Self-pay | Admitting: Family Medicine

## 2019-07-26 NOTE — Telephone Encounter (Signed)
Please remind patient to schedule a follow up visit with me Thanks Leeanne Rio, MD

## 2019-07-26 NOTE — Telephone Encounter (Signed)
Attempted to call patient to make an appointment.  There was no answer and no voicemail available.  Steven Frey, [place]

## 2019-08-01 ENCOUNTER — Ambulatory Visit (INDEPENDENT_AMBULATORY_CARE_PROVIDER_SITE_OTHER): Payer: Medicare Other | Admitting: Internal Medicine

## 2019-08-01 ENCOUNTER — Encounter: Payer: Self-pay | Admitting: Internal Medicine

## 2019-08-01 ENCOUNTER — Other Ambulatory Visit: Payer: Self-pay

## 2019-08-01 ENCOUNTER — Other Ambulatory Visit (INDEPENDENT_AMBULATORY_CARE_PROVIDER_SITE_OTHER): Payer: Medicare Other

## 2019-08-01 VITALS — BP 104/70 | HR 76 | Temp 97.6°F | Ht 68.0 in | Wt 114.6 lb

## 2019-08-01 DIAGNOSIS — K861 Other chronic pancreatitis: Secondary | ICD-10-CM

## 2019-08-01 DIAGNOSIS — F1020 Alcohol dependence, uncomplicated: Secondary | ICD-10-CM

## 2019-08-01 DIAGNOSIS — K8689 Other specified diseases of pancreas: Secondary | ICD-10-CM

## 2019-08-01 DIAGNOSIS — R197 Diarrhea, unspecified: Secondary | ICD-10-CM

## 2019-08-01 DIAGNOSIS — K219 Gastro-esophageal reflux disease without esophagitis: Secondary | ICD-10-CM

## 2019-08-01 DIAGNOSIS — R634 Abnormal weight loss: Secondary | ICD-10-CM

## 2019-08-01 DIAGNOSIS — R1084 Generalized abdominal pain: Secondary | ICD-10-CM

## 2019-08-01 LAB — BASIC METABOLIC PANEL
BUN: 11 mg/dL (ref 6–23)
CO2: 26 mEq/L (ref 19–32)
Calcium: 9.4 mg/dL (ref 8.4–10.5)
Chloride: 105 mEq/L (ref 96–112)
Creatinine, Ser: 0.91 mg/dL (ref 0.40–1.50)
GFR: 101.98 mL/min (ref >60.00)
Glucose, Bld: 161 mg/dL — ABNORMAL HIGH (ref 70–99)
Potassium: 4.2 mEq/L (ref 3.5–5.1)
Sodium: 139 mEq/L (ref 135–145)

## 2019-08-01 MED ORDER — PANTOPRAZOLE SODIUM 40 MG PO TBEC: 40.0000 mg | DELAYED_RELEASE_TABLET | Freq: Every day | ORAL | 3 refills | Status: DC

## 2019-08-01 MED ORDER — PANCRELIPASE (LIP-PROT-AMYL) 36000-114000 UNITS PO CPEP: ORAL_CAPSULE | ORAL | 6 refills | Status: DC

## 2019-08-01 NOTE — Progress Notes (Signed)
HISTORY OF PRESENT ILLNESS:  Steven Frey. is a 63 y.o. male chronic alcoholic with MULTIPLE Significant medical problems as listed below.  He is sent today by his primary care provider Dr. Ardelia Mems regarding myriad of GI complaints and wondering about surveillance colonoscopy.  He has a history of chronic alcoholism with chronic alcoholic pancreatitis with malabsorption for which he has been seen by GI specialist elsewhere.  I have reviewed this encounters.  He also has a history of GERD for which she is currently on no therapy.  I did see the patient on one occasion February 2016 when he was sent for routine colon cancer screening.  He was found to have a 5 mm ascending colon polyp which was removed and found to be a tubular adenoma.  Also moderate pandiverticulosis.  No other abnormalities.  Recommended routine follow-up based on guidelines at that time was 5 years.  The patient has had a stroke 9 years ago with resultant right-sided weakness and contracturing of the upper extremity.  He was hospitalized earlier this year (May 19 through Mar 01, 2019 for syncope, hand laceration, anemia, diabetes, hypertension, and seizure disorder) and discharged to skilled nursing facility for several weeks thereafter.  He was last seen by Dr. Ardelia Mems June 24, 2019.  Patient's chief complaints today include 40 pound weight loss over the past 6 to 12 months (though he has gained 6 pounds back over the past few months), chronic diarrhea, active GERD symptoms without dysphagia, and intermittent generalized abdominal pain occasionally exacerbated by meals.  Patient continues to drink alcohol stating 3 or 4 beers daily.  He is a chronic smoker.  He is divorced with 2 daughters.  His daughter Steven Frey accompanies him today.  For his pancreatic insufficiency he is only on 1200 units of Creon with each meal.  No PPI.  Review of blood work from June 24, 2019 shows normal comprehensive metabolic panel except for glucose  145.  Albumin is 4.2.  Normal liver test.  CBC with mild anemia with hemoglobin 11.6.  MCV 96.  Normal platelets at 229,000.  Hemoglobin A1c 7.0.  Review of x-ray file shows that his last abdominal imaging occurred February 2017 to evaluate intermittent left upper quadrant pain.  He was found to have complicated pancreatitis with progression of pseudocysts, 1 with hemorrhagic contents.  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  Also found to have complex ascites, left pleural effusion, and newly formed thrombus projecting into the left common iliac artery with luminal stenosis.  REVIEW OF SYSTEMS:  All non-GI ROS negative unless otherwise stated in the HPI except for sinus and allergy, anxiety, arthritis, back pain, visual change, confusion, depression, fatigue, fever, hearing problems, itching, muscle cramps, night sweats, shortness of breath, lower extremity swelling, excessive urination, urinary leakage  Past Medical History:  Diagnosis Date  . 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 Anemia   . Angioedema    rx requiring intubation/vent support suspected secondary to ACE1 (but also on zithromax) 5/09 hospitalization   . Anoxic brain damage (HCC)   . Arthritis   . Chronic pain 2011  . Chronic pancreatitis ([place])    (10/08 hops). admx 02/2010 pseudocyst aspirated during admxn. pancreatic dual stent placement at [facility] 11/11  . Cirrhosis ([place]) 2011   due to ETOH with recent hepatic abcess and possible HCC.   Marland Kitchen COPD (chronic obstructive pulmonary disease) ([place])    DOE  . Depression with anxiety    no hx  of meds  . Diabetes mellitus without complication ([place])    Type 2, pt reports no longer takes meds & A1C was normal.  . Edema   . ETOH abuse   . Gastritis    alcohol induced  . GERD (gastroesophageal reflux disease)   . History of stroke 2011   reports it was caused by pain from pancreatitis- caused heart to stop and stroke   . Hyperlipidemia  2011  . Hypertension 2011  . Inguinal hernia 2011  . Myocardial infarction [facility]) 2011   in setting of cardiac arrest, no obstructive CAD at cath  . Pneumonia    hx of  . Seizures ([place]) 2011   had seizure 11-24-14/went to ED per pt.   . Smoker   . Stroke [facility])    right sided weakness; affected long term memory  . Syncope and collapse 12/26/2014  . 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 VF (ventricular fibrillation) ([place]) 2011   arrest 6/11. successfully rescucitated w/prolinged hospitalization due to respitatory failure. QT prolongation during cooling, now resolved    Past Surgical History:  Procedure Laterality Date  . CARDIAC CATHETERIZATION  2011   non-obs dz, post-cardiac arrest  . [place]   right  . INGUINAL HERNIA REPAIR Left 07/12/2013   Procedure: HERNIA REPAIR INGUINAL ADULT;  Surgeon: Adin Hector, MD;  Location: [facility];  Service: General;  Laterality: Left;  . INSERTION OF MESH Left 07/12/2013   Procedure: INSERTION OF MESH;  Surgeon: Adin Hector, MD;  Location: [facility];  Service: General;  Laterality: Left;  . pancreatic stent placement    . right wrist ORIF  1976   for fx    Social History [name].  reports that he has been smoking cigarettes. He has been smoking about 0.25 packs per day. He has never used smokeless tobacco. He reports current alcohol use. He reports current drug use. Frequency: 2.00 times per week. Drug: Marijuana.  family history includes Alcohol abuse in his father, mother, and another family member; Breast cancer in his paternal grandmother; Diabetes in his sister; Early death in his mother; Hyperlipidemia in his sister; Hypertension in his sister; Prostate cancer (age of onset: 57) in his father.  Allergies  Allergen Reactions  . Ace Inhibitors Anaphylaxis and Hives    Other reaction(s): Other (See Comments) On MAR   . Azithromycin Anaphylaxis and Swelling    Throat  swelling, body swelling  . Lisinopril Anaphylaxis, Hives and Swelling    REACTION: facial/neck edema       PHYSICAL EXAMINATION: Vital signs: BP 104/70   Pulse 76   Temp 97.6 F (36.4 C)   Ht 5' 8"  (1.727 m)   Wt 114 lb 9.6 oz (52 kg)   BMI 17.42 kg/m   Constitutional: Thin chronically ill-appearing male in no acute distress Psychiatric: alert and oriented x3, cooperative Eyes: extraocular movements intact, anicteric, conjunctiva pink Mouth: oral pharynx moist, no lesions Neck: supple no lymphadenopathy Cardiovascular: heart regular rate and rhythm, no murmur Lungs: clear to auscultation bilaterally.  Slight decreased breath sounds left base. Abdomen: soft, nontender, nondistended, no obvious ascites, no peritoneal signs, normal bowel sounds, no organomegaly Rectal: Omitted Extremities: no clubbing, cyanosis, or lower extremity edema bilaterally.  Severe contracturing of the right upper extremity Skin: no lesions on visible extremities Neuro: No focal deficits. No asterixis.   ASSESSMENT:  1.  Weight loss.  Almost certainly due to malabsorption from pancreatic insufficiency.  Currently on an inadequate dose of Creon.  He is at risk for pancreatic cancer 2.  Chronic pancreatitis secondary to chronic alcoholism 3.  Chronic alcoholism.  Ongoing 4.  GERD.  Active symptoms off medical therapy 5.  Chronic abdominal pain.  Likely related to pancreatitis and/or acid peptic disorders 6.  History of diminutive adenoma February 2016.  Based on current guidelines revised earlier this year would be due for routine follow-up between 2018 and 2026 7.  Multiple significant medical problems, ongoing. 8.  Ongoing tobacco abuse  PLAN:  1.  Increase Creon to 3600 units.  Take 2 prior to each meal and snacks 2.  Prescribe pantoprazole 40 mg daily for active GERD symptoms and treatment of potential concurrent acid peptic disorder such as ulcers 3.  Stop drinking alcohol forever.  He will not  he states 4.  Stop smoking.  He will not he states 5.  Schedule contrast-enhanced CT scan of the abdomen with fine cuts through the pancreas to rule out occult neoplasm.  We will contact patient after the results are available 6.  Office follow-up with myself in 4 to 6 weeks 7.  Ongoing general medical care with Dr. Ardelia Mems 45 minutes spent face-to-face with the patient.  Greater than 50% of the time used for counseling regarding his multiple GI issues, their origins, treatment plans, and recommendations.

## 2019-08-01 NOTE — Patient Instructions (Signed)
We have sent the following medications to your pharmacy for you to pick up at your convenience:  Pantoprazole, Creon.  Increase your Creon to 2 capsules with each meal and a snack.  You have been scheduled for a CT scan of the abdomen and pelvis at [facility] ([address]---this is in the same building as [employer]).   You are scheduled on 08/06/2019 at 3:00pm. You should arrive 15 minutes prior to your appointment time for registration. Please follow the written instructions below on the day of your exam:  WARNING: IF YOU ARE ALLERGIC TO IODINE/X-RAY DYE, PLEASE NOTIFY RADIOLOGY IMMEDIATELY AT [phone]! YOU WILL BE GIVEN A 13 HOUR PREMEDICATION PREP.  1) Do not eat anything after 11:00am (4 hours prior to your test) 2) You have been given 2 bottles of oral contrast to drink. The solution may taste better if refrigerated, but do NOT add ice or any other liquid to this solution. Shake well before drinking.    Drink 1 bottle of contrast @ 1:00pm (2 hours prior to your exam)  Drink 1 bottle of contrast @ 2:00pm (1 hour prior to your exam)  You may take any medications as prescribed with a small amount of water, if necessary. If you take any of the following medications: METFORMIN, GLUCOPHAGE, GLUCOVANCE, AVANDAMET, RIOMET, FORTAMET, [place] MET, JANUMET, GLUMETZA or METAGLIP, you MAY be asked to HOLD this medication 48 hours AFTER the exam.  Per Dr. Henrene Pastor, try to stop drinking  The purpose of you drinking the oral contrast is to aid in the visualization of your intestinal tract. The contrast solution may cause some diarrhea. Depending on your individual set of symptoms, you may also receive an intravenous injection of x-ray contrast/dye. Plan on being at [facility] for 30 minutes or longer, depending on the type of exam you are having performed.  This test typically takes 30-45 minutes to complete.  If you have any questions regarding your exam or if you  need to reschedule, you may call the CT department at [phone] between the hours of 8:00 am and 5:00 pm, Monday-Friday.  ________________________________________________________________________

## 2019-08-06 ENCOUNTER — Ambulatory Visit (HOSPITAL_COMMUNITY): Payer: Medicare Other

## 2019-08-15 ENCOUNTER — Ambulatory Visit (HOSPITAL_COMMUNITY)
Admission: RE | Admit: 2019-08-15 | Discharge: 2019-08-15 | Disposition: A | Discharge status: Home / Self Care | Payer: Medicare Other | Source: Ambulatory Visit | Attending: Internal Medicine | Admitting: Internal Medicine

## 2019-08-15 ENCOUNTER — Other Ambulatory Visit: Payer: Self-pay

## 2019-08-15 DIAGNOSIS — K861 Other chronic pancreatitis: Secondary | ICD-10-CM | POA: Diagnosis not present

## 2019-08-15 DIAGNOSIS — R197 Diarrhea, unspecified: Secondary | ICD-10-CM | POA: Insufficient documentation

## 2019-08-15 DIAGNOSIS — R634 Abnormal weight loss: Secondary | ICD-10-CM

## 2019-08-15 DIAGNOSIS — K219 Gastro-esophageal reflux disease without esophagitis: Secondary | ICD-10-CM | POA: Insufficient documentation

## 2019-08-15 DIAGNOSIS — K859 Acute pancreatitis without necrosis or infection, unspecified: Secondary | ICD-10-CM | POA: Diagnosis not present

## 2019-08-15 MED ORDER — IOHEXOL 300 MG/ML  SOLN
100.0000 mL | Freq: Once | INTRAMUSCULAR | Status: AC | PRN
  Administered 2019-08-15: 10:00:00 100 mL via INTRAVENOUS

## 2019-08-15 MED ORDER — SODIUM CHLORIDE (PF) 0.9 % IJ SOLN
INTRAMUSCULAR | Status: AC
  Filled 2019-08-15: qty 50

## 2019-08-16 ENCOUNTER — Telehealth: Payer: Self-pay | Admitting: Internal Medicine

## 2019-08-16 ENCOUNTER — Other Ambulatory Visit: Payer: Self-pay | Admitting: Family Medicine

## 2019-08-16 NOTE — Telephone Encounter (Signed)
Virtual Visit Pre-Appointment Phone Call  "(Name), I am calling you today to discuss your upcoming appointment. We are currently trying to limit exposure to the virus that causes COVID-19 by seeing patients at home rather than in the office."  1. "What is the BEST phone number to call the day of the visit?" - include this in appointment notes  2. Do you have or have access to (through a family member/friend) a smartphone with video capability that we can use for your visit?" a. If yes - list this number in appt notes as cell (if different from BEST phone #) and list the appointment type as a VIDEO visit in appointment notes b. If no - list the appointment type as a PHONE visit in appointment notes  3. Confirm consent - "In the setting of the current Covid19 crisis, you are scheduled for a (phone or video) visit with your provider on (date) at (time).  Just as we do with many in-office visits, in order for you to participate in this visit, we must obtain consent.  If you'd like, I can send this to your mychart (if signed up) or email for you to review.  Otherwise, I can obtain your verbal consent now.  All virtual visits are billed to your insurance company just like a normal visit would be.  By agreeing to a virtual visit, we'd like you to understand that the technology does not allow for your provider to perform an examination, and thus may limit your provider's ability to fully assess your condition. If your provider identifies any concerns that need to be evaluated in person, we will make arrangements to do so.  Finally, though the technology is pretty good, we cannot assure that it will always work on either your or our end, and in the setting of a video visit, we may have to convert it to a phone-only visit.  In either situation, we cannot ensure that we have a secure connection.  Are you willing to proceed?" STAFF: Did the patient verbally acknowledge consent to telehealth visit? Document  YES/NO here: yes  4. Advise patient to be prepared - "Two hours prior to your appointment, go ahead and check your blood pressure, pulse, oxygen saturation, and your weight (if you have the equipment to check those) and write them all down. When your visit starts, your provider will ask you for this information. If you have an Apple Watch or Kardia device, please plan to have heart rate information ready on the day of your appointment. Please have a pen and paper handy nearby the day of the visit as well."  5. Give patient instructions for MyChart download to smartphone OR Doximity/Doxy.me as below if video visit (depending on what platform provider is using)  6. Inform patient they will receive a phone call 15 minutes prior to their appointment time (may be from unknown caller ID) so they should be prepared to answer    TELEPHONE CALL NOTE  Steven Frey. has been deemed a candidate for a follow-up tele-health visit to limit community exposure during the Covid-19 pandemic. I spoke with the patient via phone to ensure availability of phone/video source, confirm preferred email & phone number, and discuss instructions and expectations.  I reminded Steven Frey. to be prepared with any vital sign and/or heart rhythm information that could potentially be obtained via home monitoring, at the time of his visit. I reminded Steven Frey. to expect a phone call prior to  his visit.  Steven Frey 08/16/2019 5:49 PM   INSTRUCTIONS FOR DOWNLOADING THE MYCHART APP TO SMARTPHONE  - The patient must first make sure to have activated MyChart and know their login information - If Apple, go to [employer] and type in MyChart in the search bar and download the app. If Android, ask patient to go to [employer] and type in [place] in the search bar and download the app. The app is free but as with any other app downloads, their phone may require them to verify saved payment information or  Apple/Android password.  - The patient will need to then log into the app with their MyChart username and password, and select [place] as their healthcare provider to link the account. When it is time for your visit, go to the MyChart app, find appointments, and click Begin Video Visit. Be sure to Select Allow for your device to access the Microphone and Camera for your visit. You will then be connected, and your provider will be with you shortly.  **If they have any issues connecting, or need assistance please contact MyChart service desk [phone])**  **If using a computer, in order to ensure the best quality for their visit they will need to use either of the following Internet Browsers: [employer], or Google Chrome**  IF USING DOXIMITY or DOXY.ME - The patient will receive a link just prior to their visit by text.     FULL LENGTH CONSENT FOR TELE-HEALTH VISIT   I hereby voluntarily request, consent and authorize [facility] and its employed or contracted physicians, physician assistants, nurse practitioners or other licensed health care professionals (the Practitioner), to provide me with telemedicine health care services (the Services") as deemed necessary by the treating Practitioner. I acknowledge and consent to receive the Services by the Practitioner via telemedicine. I understand that the telemedicine visit will involve communicating with the Practitioner through live audiovisual communication technology and the disclosure of certain medical information by electronic transmission. I acknowledge that I have been given the opportunity to request an in-person assessment or other available alternative prior to the telemedicine visit and am voluntarily participating in the telemedicine visit.  I understand that I have the right to withhold or withdraw my consent to the use of telemedicine in the course of my care at any time, without affecting my right to future care  or treatment, and that the Practitioner or I may terminate the telemedicine visit at any time. I understand that I have the right to inspect all information obtained and/or recorded in the course of the telemedicine visit and may receive copies of available information for a reasonable fee.  I understand that some of the potential risks of receiving the Services via telemedicine include:   Delay or interruption in medical evaluation due to technological equipment failure or disruption;  Information transmitted may not be sufficient (e.g. poor resolution of images) to allow for appropriate medical decision making by the Practitioner; and/or   In rare instances, security protocols could fail, causing a breach of personal health information.  Furthermore, I acknowledge that it is my responsibility to provide information about my medical history, conditions and care that is complete and accurate to the best of my ability. I acknowledge that Practitioner's advice, recommendations, and/or decision may be based on factors not within their control, such as incomplete or inaccurate data provided by me or distortions of diagnostic images or specimens that may result from electronic transmissions. I  understand that the practice of medicine is not an exact science and that Practitioner makes no warranties or guarantees regarding treatment outcomes. I acknowledge that I will receive a copy of this consent concurrently upon execution via email to the email address I last provided but may also request a printed copy by calling the office of [facility].    I understand that my insurance will be billed for this visit.   I have Frey or had this consent Frey to me.  I understand the contents of this consent, which adequately explains the benefits and risks of the Services being provided via telemedicine.   I have been provided ample opportunity to ask questions regarding this consent and the Services and have had  my questions answered to my satisfaction.  I give my informed consent for the services to be provided through the use of telemedicine in my medical care  By participating in this telemedicine visit I agree to the above.

## 2019-08-20 ENCOUNTER — Other Ambulatory Visit: Payer: Self-pay | Admitting: Family Medicine

## 2019-08-30 ENCOUNTER — Other Ambulatory Visit: Payer: Self-pay | Admitting: Family Medicine

## 2019-09-19 ENCOUNTER — Ambulatory Visit (INDEPENDENT_AMBULATORY_CARE_PROVIDER_SITE_OTHER): Payer: Medicare Other | Admitting: Internal Medicine

## 2019-09-19 ENCOUNTER — Encounter: Payer: Self-pay | Admitting: Internal Medicine

## 2019-09-19 VITALS — BP 118/70 | HR 80 | Temp 98.8°F | Ht 65.5 in | Wt 115.5 lb

## 2019-09-19 DIAGNOSIS — K219 Gastro-esophageal reflux disease without esophagitis: Secondary | ICD-10-CM

## 2019-09-19 DIAGNOSIS — K861 Other chronic pancreatitis: Secondary | ICD-10-CM

## 2019-09-19 DIAGNOSIS — R634 Abnormal weight loss: Secondary | ICD-10-CM

## 2019-09-19 DIAGNOSIS — K8689 Other specified diseases of pancreas: Secondary | ICD-10-CM

## 2019-09-19 DIAGNOSIS — R197 Diarrhea, unspecified: Secondary | ICD-10-CM

## 2019-09-19 DIAGNOSIS — F1020 Alcohol dependence, uncomplicated: Secondary | ICD-10-CM | POA: Diagnosis not present

## 2019-09-19 NOTE — Progress Notes (Signed)
HISTORY OF PRESENT ILLNESS:  Steven Frey. is a 63 y.o. male with multiple significant medical problems who was last seen in the office August 01, 2019 regarding problems with his chronic pancreatitis secondary to ongoing chronic alcoholism with associated pancreatic insufficiency.  See that dictation for details.  He was also having active GERD symptoms off medical therapy.  He was prescribed pantoprazole 40 mg daily for GERD.  Also asked to increase his Creon to 7200 units daily.  He was told to stop smoking and drinking alcohol.  A contrast-enhanced CT scan of the abdomen pelvis revealed chronic pancreatitis but no evidence for cancer.  He follows up at this time.  He is pleased to report that his diarrhea has resolved with adjusted Creon dose.  GERD symptoms also resolved on PPI therapy.  He continues to drink and smoke.  He is accompanied by his daughter.  Basic metabolic panel August 01, 2019 revealed normal electrolytes  REVIEW OF SYSTEMS:  All non-GI ROS negative unless otherwise stated in the HPI except for right-sided weakness  Past Medical History:  Diagnosis Date  . 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 Anemia   . Angioedema    rx requiring intubation/vent support suspected secondary to ACE1 (but also on zithromax) 5/09 hospitalization   . Anoxic brain damage (HCC)   . Arthritis   . Chronic pain 2011  . Chronic pancreatitis ([place])    (10/08 hops). admx 02/2010 pseudocyst aspirated during admxn. pancreatic dual stent placement at [facility] 11/11  . Cirrhosis ([place]) 2011   due to ETOH with recent hepatic abcess and possible HCC.   . Colon polyps   . COPD (chronic obstructive pulmonary disease) ([place])    DOE  . Depression with anxiety    no hx of meds  . Diabetes mellitus without complication ([place])    Type 2, pt reports no longer takes meds & A1C was normal.  . Diverticulosis   . Edema   . ETOH abuse   . Gastritis    alcohol induced  . GERD (gastroesophageal reflux disease)   .  History of stroke 2011   reports it was caused by pain from pancreatitis- caused heart to stop and stroke   . Hyperlipidemia 2011  . Hypertension 2011  . Inguinal hernia 2011  . Myocardial infarction [facility]) 2011   in setting of cardiac arrest, no obstructive CAD at cath  . Pneumonia    hx of  . Seizures ([place]) 2011   had seizure 11-24-14/went to ED per pt.   . Smoker   . Stroke [facility])    right sided weakness; affected long term memory  . Syncope and collapse 12/26/2014  . 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 VF (ventricular fibrillation) ([place]) 2011   arrest 6/11. successfully rescucitated w/prolinged hospitalization due to respitatory failure. QT prolongation during cooling, now resolved    Past Surgical History:  Procedure Laterality Date  . CARDIAC CATHETERIZATION  2011   non-obs dz, post-cardiac arrest  . [place]   right  . INGUINAL HERNIA REPAIR Left 07/12/2013   Procedure: HERNIA REPAIR INGUINAL ADULT;  Surgeon: Adin Hector, MD;  Location: [place];  Service: General;  Laterality: Left;  . INSERTION OF MESH Left 07/12/2013   Procedure: INSERTION OF MESH;  Surgeon: Adin Hector, MD;  Location: [place];  Service: General;  Laterality: Left;  . pancreatic stent placement    . right wrist ORIF  1976  for fx    Social History Legend Tumminello.  reports that he has been smoking cigarettes. He has been smoking about 0.25 packs per day. He has never used smokeless tobacco. He reports current alcohol use. He reports current drug use. Frequency: 2.00 times per week. Drug: Marijuana.  family history includes Alcohol abuse in his father, mother, and another family member; Breast cancer in his paternal grandmother; Diabetes in his sister; Early death in his mother; Hyperlipidemia in his sister; Hypertension in his sister; Prostate cancer (age of onset: 25) in his father.  Allergies  Allergen Reactions  . Ace Inhibitors  Anaphylaxis and Hives    Other reaction(s): Other (See Comments) On MAR   . Azithromycin Anaphylaxis and Swelling    Throat swelling, body swelling  . Lisinopril Anaphylaxis, Hives and Swelling    REACTION: facial/neck edema       PHYSICAL EXAMINATION: Vital signs: BP 118/70 (BP Location: Left Arm, Patient Position: Sitting, Cuff Size: Normal)   Pulse 80 Comment: irregular  Temp 98.8 F (37.1 C)   Ht 5' 5.5" (1.664 m)   Wt 115 lb 8 oz (52.4 kg)   BMI 18.93 kg/m   Constitutional: Thin, chronically ill-appearing, no acute distress Psychiatric: alert and oriented x3, cooperative Eyes: extraocular movements intact, anicteric, conjunctiva pink Mouth: Mask Abdomen: Not reexamined Rectal: Omitted Neuro: Right-sided hemiparesis.    ASSESSMENT:  1.  Chronic alcoholic pancreatitis with pancreatic insufficiency. 2.  Diarrhea secondary to #1 above.  Improved with increased dose of Creon.  Currently 7200 units daily 3.  GERD.  Symptoms resolved on once daily PPI 4.  Ongoing alcohol and tobacco abuse 5.  Multiple significant medical problems 6.  History of diminutive colonic adenoma 2016   PLAN:  1.  Continue Creon 2.  Continue PPI 3.  Stop smoking and drinking alcohol.  Again advised 4.  Routine office follow-up 1 year 5.  Surveillance colonoscopy pending overall health between 2023 and 2026 6.  Resume general medical care with PCP team 25-minute spent face-to-face with the patient.  Greater than 50% of the time used for counseling regarding his myriad of problems related to chronic pancreatitis, alcoholism, stearrhea, and GERD

## 2019-09-19 NOTE — Patient Instructions (Signed)
If you are age 62 or older, your body mass index should be between 23-30. Your Body mass index is 18.93 kg/m. If this is out of the aforementioned range listed, please consider follow up with your Primary Care Provider.  If you are age 72 or younger, your body mass index should be between 19-25. Your Body mass index is 18.93 kg/m. If this is out of the aformentioned range listed, please consider follow up with your Primary Care Provider.   Please follow up with Dr. Henrene Pastor in 1 year. Call back to schedule appointment.

## 2019-09-20 ENCOUNTER — Ambulatory Visit (INDEPENDENT_AMBULATORY_CARE_PROVIDER_SITE_OTHER): Payer: Medicare Other

## 2019-09-20 ENCOUNTER — Other Ambulatory Visit: Payer: Self-pay

## 2019-09-20 DIAGNOSIS — Z0000 Encounter for general adult medical examination without abnormal findings: Secondary | ICD-10-CM

## 2019-09-20 NOTE — Progress Notes (Signed)
Subjective:   Steven Frey. is a 63 y.o. male who presents for Medicare Annual/Subsequent preventive examination.  The patient consented to a virtual visit.   Review of Systems: Defer to PCP.  Cardiac Risk Factors include: sedentary lifestyle;smoking/ tobacco exposure  Objective:    Vitals: There were no vitals taken for this visit.  There is no height or weight on file to calculate BMI.  Advanced Directives 09/20/2019 06/24/2019 03/28/2019 02/26/2019 02/26/2019 04/27/2018 10/20/2017  Does Patient Have a Medical Advance Directive? Yes No No No No No No  Type of Advance Directive [place] in Chart? No - copy requested - - - - - -  Would patient like information on creating a medical advance directive? - No - Patient declined Yes (MAU/Ambulatory/Procedural Areas - Information given) No - Patient declined No - Patient declined No - Patient declined No - Patient declined  Pre-existing out of facility DNR order (yellow form or pink MOST form) - - - - - - -    Tobacco Social History   Tobacco Use  Smoking Status Current Every Day Smoker  . Packs/day: 0.25  . Types: Cigarettes  Smokeless Tobacco Never Used  Tobacco Comment   5-6 cigarettes per day     Ready to quit: Yes Counseling given: Yes Comment: 5-6 cigarettes per day  Clinical Intake:  Pre-visit preparation completed: Yes  How often do you need to have someone help you when you read instructions, pamphlets, or other written materials from your doctor or pharmacy?: 2 - Rarely What is the last grade level you completed in school?: high school  Interpreter Needed?: No  Past Medical History:  Diagnosis Date  . 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 Anemia   . Angioedema    rx requiring intubation/vent support suspected secondary to ACE1 (but also on zithromax) 5/09 hospitalization   . Anoxic brain damage (HCC)   . Arthritis   . Chronic pain 2011  . Chronic  pancreatitis ([place])    (10/08 hops). admx 02/2010 pseudocyst aspirated during admxn. pancreatic dual stent placement at [facility] 11/11  . 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   . Colon polyps   . COPD (chronic obstructive pulmonary disease) ([place])    DOE  . Depression with anxiety    no hx of meds  . Diabetes mellitus without complication ([place])    Type 2, pt reports no longer takes meds & A1C was normal.  . Diverticulosis   . Edema   . ETOH abuse   . Gastritis    alcohol induced  . GERD (gastroesophageal reflux disease)   . History of stroke 2011   reports it was caused by pain from pancreatitis- caused heart to stop and stroke   . Hyperlipidemia 2011  . Hypertension 2011  . Inguinal hernia 2011  . Myocardial infarction [facility]) 2011   in setting of cardiac arrest, no obstructive CAD at cath  . Pneumonia    hx of  . Seizures ([name]) 2011   had seizure 11-24-14/went to ED per pt.   . Smoker   . Stroke [facility])    right sided weakness; affected long term memory  . Syncope and collapse 12/26/2014  . 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 VF (ventricular fibrillation) ([place]) 2011   arrest 6/11. successfully rescucitated w/prolinged hospitalization due to respitatory failure.  QT prolongation during cooling, now resolved   Past Surgical History:  Procedure Laterality Date  . CARDIAC CATHETERIZATION  2011   non-obs dz, post-cardiac arrest  . [place]   right  . INGUINAL HERNIA REPAIR Left 07/12/2013   Procedure: HERNIA REPAIR INGUINAL ADULT;  Surgeon: Adin Hector, MD;  Location: [place];  Service: General;  Laterality: Left;  . INSERTION OF MESH Left 07/12/2013   Procedure: INSERTION OF MESH;  Surgeon: Adin Hector, MD;  Location: [place];  Service: General;  Laterality: Left;  . pancreatic stent placement    . right wrist ORIF  1976   for fx   Family History  Problem Relation Age of  Onset  . Alcohol abuse Mother   . Early death Mother   . Alcohol abuse Father   . Prostate cancer Father 37  . Alcohol abuse Other   . Diabetes Sister   . Hyperlipidemia Sister   . Hypertension Sister   . Breast cancer Paternal Grandmother   . Coronary artery disease Neg Hx   . Colon cancer Neg Hx    Social History   Socioeconomic History  . Marital status: Single    Spouse name: Not on file  . Number of children: 2  . Years of education: 48  . Highest education level: High school graduate  Occupational History  . Occupation: disabled   Tobacco Use  . Smoking status: Current Every Day Smoker    Packs/day: 0.25    Types: Cigarettes  . Smokeless tobacco: Never Used  . Tobacco comment: 5-6 cigarettes per day  Substance and Sexual Activity  . Alcohol use: Yes    Comment: hx of alcohol abuse; 4 drinks per day as of 08/01/19  . Drug use: Yes    Frequency: 2.0 times per week    Types: Marijuana, Heroin, Cocaine    Comment: h/o THC, cocaine, heroin; no longer using since 2011  . Sexual activity: Not Currently  Other Topics Concern  . Not on file  Social History Narrative   Patient lives with his father in [place].    Patient is disabled and has not worked in over ~10 years.    Patient is close with his family, his childern and siblings.       Social Determinants of Health   Financial Resource Strain: Low Risk   . Difficulty of Paying Living Expenses: Not hard at all  Food Insecurity: No Food Insecurity  . Worried About Charity fundraiser in the Last Year: Never true  . Ran Out of Food in the Last Year: Never true  Transportation Needs: No Transportation Needs  . Lack of Transportation (Medical): No  . Lack of Transportation (Non-Medical): No  Physical Activity: Inactive  . Days of Exercise per Week: 0 days  . Minutes of Exercise per Session: 0 min  Stress: No Stress Concern Present  . Feeling of Stress : Not at all  Social Connections: Moderately Isolated  .  Frequency of Communication with Friends and Family: More than three times a week  . Frequency of Social Gatherings with Friends and Family: More than three times a week  . Attends Religious Services: Never  . Active Member of Clubs or Organizations: No  . Attends Archivist Meetings: Never  . Marital Status: Never married    Outpatient Encounter Medications as of 09/20/2019  Medication Sig  . acetaminophen (TYLENOL) 500 MG tablet Take 500 mg by mouth 2 (two) times  daily as needed for mild pain or headache.   Marland Kitchen amLODipine (NORVASC) 5 MG tablet TAKE 1 TABLET BY MOUTH EVERY DAY (Patient taking differently: Take 5 mg by mouth daily. )  . ASPIRIN ADULT LOW STRENGTH 81 MG EC tablet TAKE 1 TABLET BY MOUTH EVERY DAY (Patient taking differently: Take 81 mg by mouth daily. )  . atorvastatin (LIPITOR) 40 MG tablet TAKE 1 TABLET BY MOUTH EVERY DAY  . carvedilol (COREG) 3.125 MG tablet TAKE 1 TABLET BY MOUTH TWICE A DAY WITH MEALS  . [facility] B-1 100 MG tablet TAKE 1 TABLET BY MOUTH EVERY DAY  . [facility] B-12 1000 MCG TBCR TAKE 1 TABLET (1,000 MCG TOTAL) BY MOUTH DAILY.  . folic acid (FOLVITE) 1 MG tablet TAKE 1 TABLET BY MOUTH EVERY DAY  . levETIRAcetam (KEPPRA) 500 MG tablet TAKE 1 TABLET BY MOUTH TWICE A DAY  . lipase/protease/amylase (CREON) 36000 UNITS CPEP capsule Take 2 capsules with each meal and a snack (total of 8)  . sertraline (ZOLOFT) 50 MG tablet TAKE 3 TABLETS BY MOUTH EVERY DAY  . pantoprazole (PROTONIX) 40 MG tablet Take 1 tablet (40 mg total) by mouth daily. (Patient not taking: Reported on 09/20/2019)   No facility-administered encounter medications on file as of 09/20/2019.   Activities of Daily Living In your present state of health, do you have any difficulty performing the following activities: 09/20/2019 03/28/2019  Hearing? N N  Vision? N N  Difficulty concentrating or making decisions? Tempie Donning  Comment Stroke -  Walking or climbing stairs? Y N  Comment Stroke- right side -    Dressing or bathing? N N  Doing errands, shopping? Tempie Donning  Comment family members help with shopping member states his dad and family shops  Preparing Food and eating ? N Y  Comment - member states his dad cooks  Using the Toilet? N N  In the past six months, have you accidently leaked urine? N -  Do you have problems with loss of bowel control? N -  Managing your Medications? N N  Managing your Finances? N Y  Comment - member states his dad pays bills  Housekeeping or managing your Housekeeping? N Y  Comment - member states his dad cleans as member unable to use his hand  Some recent data might be hidden   Patient Care Team: Leeanne Rio, MD as PCP - General (Pediatrics) Thompson Grayer, MD as PCP - Electrophysiology (Cardiology) Marcial Pacas, MD as Consulting Physician (Neurology)   Assessment:   This is a routine wellness examination for Gwyndolyn Saxon.  Exercise Activities and Dietary recommendations Current Exercise Habits: Home exercise routine, Type of exercise: walking, Time (Minutes): 10, Frequency (Times/Week): 1, Weekly Exercise (Minutes/Week): 10, Intensity: Mild, Exercise limited by: orthopedic condition(s);neurologic condition(s);Other - see comments  Goals    . Quit Smoking     Currently smokes 5-6 cigarettes a day. Counseling given.       Fall Risk Fall Risk  09/20/2019 06/24/2019 03/28/2019 02/26/2019 04/27/2018  Falls in the past year? 1 0 1 1 No  Number falls in past yr: 1 0 1 1 -  Comment - - member states he fell because he quit taking his seizure medication and he thought it was ok to quit - -  Injury with Fall? 0 - 1 1 -  Comment - - - - -  Risk Factor Category  - - - - -  Risk for fall due to : Impaired balance/gait;Impaired mobility - History  of fall(s);Other (Comment) Impaired mobility -  Risk for fall due to: Comment - - non-compliance to seizure medication - -  Follow up - - Falls evaluation completed;Education provided;Falls prevention discussed Falls  prevention discussed;Falls evaluation completed -  Comment - - encouraged member to call PCP for post SNF follow up appointment and member states he will call next week - -   Is the patient's home free of loose throw rugs in walkways, pet beds, electrical cords, etc?   no      Grab bars in the bathroom? no      Handrails on the stairs?   yes      Adequate lighting?   yes  Patient rating of health (0-10): 6  Depression Screen PHQ 2/9 Scores 09/20/2019 06/24/2019 03/28/2019 02/26/2019  PHQ - 2 Score 2 0 0 1  PHQ- 9 Score - - - -  Exception Documentation - - - -   Cognitive Function  6CIT Screen 09/20/2019  What Year? 0 points  What month? 0 points  What time? 0 points  Count back from 20 0 points  Months in reverse 4 points  Repeat phrase 2 points  Total Score 6   Immunization History  Administered Date(s) Administered  . Influenza Whole 08/24/2010  . Influenza,inj,Quad PF,6+ Mos 09/26/2014, 07/14/2017, 06/24/2019  . Pneumococcal Polysaccharide-23 08/24/2010  . Tdap 03/10/2016, 02/26/2019   Screening Tests Health Maintenance  Topic Date Due  . FOOT EXAM  01/23/1967  . OPHTHALMOLOGY EXAM  01/23/1967  . URINE MICROALBUMIN  01/23/1967  . COLONOSCOPY  11/27/2019  . HEMOGLOBIN A1C  12/22/2019  . TETANUS/TDAP  02/25/2029  . INFLUENZA VACCINE  Completed  . PNEUMOCOCCAL POLYSACCHARIDE VACCINE AGE 83-64 HIGH RISK  Completed  . Hepatitis C Screening  Completed  . HIV Screening  Completed   Cancer Screenings: Lung: Low Dose CT Chest recommended if Age 13-80 years, 30 pack-year currently smoking OR have quit w/in 15years. Patient does qualify. Colorectal: Completed-next due 11/2019  Additional Screenings: Hepatitis Screening: Completed  HIV Screening: Completed  PNA Vaccine: Completed   Plan:  Continue to try and quit smoking!  1-800-Quit-NOW Apt with PCP 10/15/2019 @9 :30am  I have personally reviewed and noted the following in the patient's chart:   . Medical and social  history . Use of alcohol, tobacco or illicit drugs  . Current medications and supplements . Functional ability and status . Nutritional status . Physical activity . Advanced directives . List of other physicians . Hospitalizations, surgeries, and ER visits in previous 12 months . Vitals . Screenings to include cognitive, depression, and falls . Referrals and appointments  In addition, I have reviewed and discussed with patient certain preventive protocols, quality metrics, and best practice recommendations. A written personalized care plan for preventive services as well as general preventive health recommendations were provided to patient.  This visit was conducted virtually in the setting of the [place] pandemic.   Dorna Bloom, [place]  09/24/2019

## 2019-09-24 ENCOUNTER — Other Ambulatory Visit: Payer: Self-pay | Admitting: Family Medicine

## 2019-09-24 NOTE — Patient Instructions (Addendum)
You spoke to Steven Frey, [place] over the phone for your annual wellness visit.  We discussed goals: Goals    . Quit Smoking     Currently smokes 5-6 cigarettes a day. Counseling given.       We also discussed recommended health maintenance. As discussed, you are pretty much up to date with everything. We have scheduled you an apt with PCP, January 5th @9 :30am.  Health Maintenance  Topic Date Due  . FOOT EXAM  01/23/1967  . OPHTHALMOLOGY EXAM  01/23/1967  . URINE MICROALBUMIN  01/23/1967  . COLONOSCOPY  11/27/2019  . HEMOGLOBIN A1C  12/22/2019  . TETANUS/TDAP  02/25/2029  . INFLUENZA VACCINE  Completed  . PNEUMOCOCCAL POLYSACCHARIDE VACCINE AGE 71-64 HIGH RISK  Completed  . Hepatitis C Screening  Completed  . HIV Screening  Completed   Continue to try and quit smoking!  1-800-Quit-NOW Apt with PCP 10/15/2019 @9 :30am  Preventive Care 36-10 Years Old, Male Preventive care refers to lifestyle choices and visits with your health care provider that can promote health and wellness. This includes:  A yearly physical exam. This is also called an annual well check.  Regular dental and eye exams.  Immunizations.  Screening for certain conditions.  Healthy lifestyle choices, such as eating a healthy diet, getting regular exercise, not using drugs or products that contain nicotine and tobacco, and limiting alcohol use. What can I expect for my preventive care visit? Physical exam Your health care provider will check:  Height and weight. These may be used to calculate body mass index (BMI), which is a measurement that tells if you are at a healthy weight.  Heart rate and blood pressure.  Your skin for abnormal spots. Counseling Your health care provider may ask you questions about:  Alcohol, tobacco, and drug use.  Emotional well-being.  Home and relationship well-being.  Sexual activity.  Eating habits.  Work and work Statistician. What immunizations do I  need?  Influenza (flu) vaccine  This is recommended every year. Tetanus, diphtheria, and pertussis (Tdap) vaccine  You may need a Td booster every 10 years. Varicella (chickenpox) vaccine  You may need this vaccine if you have not already been vaccinated. Zoster (shingles) vaccine  You may need this after age 84. Measles, mumps, and rubella (MMR) vaccine  You may need at least one dose of MMR if you were born in 1957 or later. You may also need a second dose. Pneumococcal conjugate (PCV13) vaccine  You may need this if you have certain conditions and were not previously vaccinated. Pneumococcal polysaccharide (PPSV23) vaccine  You may need one or two doses if you smoke cigarettes or if you have certain conditions. Meningococcal conjugate (MenACWY) vaccine  You may need this if you have certain conditions. Hepatitis A vaccine  You may need this if you have certain conditions or if you travel or work in places where you may be exposed to hepatitis A. Hepatitis B vaccine  You may need this if you have certain conditions or if you travel or work in places where you may be exposed to hepatitis B. Haemophilus influenzae type b (Hib) vaccine  You may need this if you have certain risk factors. Human papillomavirus (HPV) vaccine  If recommended by your health care provider, you may need three doses over 6 months. You may receive vaccines as individual doses or as more than one vaccine together in one shot (combination vaccines). Talk with your health care provider about the risks and benefits  of combination vaccines. What tests do I need? Blood tests  Lipid and cholesterol levels. These may be checked every 5 years, or more frequently if you are over 53 years old.  Hepatitis C test.  Hepatitis B test. Screening  Lung cancer screening. You may have this screening every year starting at age 11 if you have a 30-pack-year history of smoking and currently smoke or have quit within  the past 15 years.  Prostate cancer screening. Recommendations will vary depending on your family history and other risks.  Colorectal cancer screening. All adults should have this screening starting at age 68 and continuing until age 56. Your health care provider may recommend screening at age 36 if you are at increased risk. You will have tests every 1-10 years, depending on your results and the type of screening test.  Diabetes screening. This is done by checking your blood sugar (glucose) after you have not eaten for a while (fasting). You may have this done every 1-3 years.  Sexually transmitted disease (STD) testing. Follow these instructions at home: Eating and drinking  Eat a diet that includes fresh fruits and vegetables, whole grains, lean protein, and low-fat dairy products.  Take vitamin and mineral supplements as recommended by your health care provider.  Do not drink alcohol if your health care provider tells you not to drink.  If you drink alcohol: ? Limit how much you have to 0-2 drinks a day. ? Be aware of how much alcohol is in your drink. In the [place] one drink equals one 12 oz bottle of beer (355 mL), one 5 oz glass of wine (148 mL), or one 1 oz glass of hard liquor (44 mL). Lifestyle  Take daily care of your teeth and gums.  Stay active. Exercise for at least 30 minutes on 5 or more days each week.  Do not use any products that contain nicotine or tobacco, such as cigarettes, e-cigarettes, and chewing tobacco. If you need help quitting, ask your health care provider.  If you are sexually active, practice safe sex. Use a condom or other form of protection to prevent STIs (sexually transmitted infections).  Talk with your health care provider about taking a low-dose aspirin every day starting at age 99. What's next?  Go to your health care provider once a year for a well check visit.  Ask your health care provider how often you should have your eyes and teeth  checked.  Stay up to date on all vaccines. This information is not intended to replace advice given to you by your health care provider. Make sure you discuss any questions you have with your health care provider. Document Released: 10/23/2015 Document Revised: 09/20/2018 Document Reviewed: 09/20/2018 Elsevier Patient Education  2020 [place] [facility]'s number is [phone]. Please call with questions or concerns about what we discussed today.

## 2019-09-27 ENCOUNTER — Other Ambulatory Visit: Payer: Self-pay | Admitting: Family Medicine

## 2019-09-27 NOTE — Telephone Encounter (Signed)
Pt called and verified that he is still taking the flomax everyday. He would like medication to be refilled and to [facility] [address] in [place].   To PCP  Talbot Grumbling, RN

## 2019-09-27 NOTE — Telephone Encounter (Signed)
Please clarify with patient whether he is taking flomax - it's not currently on his medication list. Thanks! Leeanne Rio, MD

## 2019-10-01 ENCOUNTER — Other Ambulatory Visit: Payer: Self-pay | Admitting: Family Medicine

## 2019-10-02 MED ORDER — SERTRALINE HCL 50 MG PO TABS: 150.0000 mg | ORAL_TABLET | Freq: Every day | ORAL | 0 refills | Status: DC

## 2019-10-02 NOTE — Progress Notes (Signed)
I have reviewed this visit and agree with the documentation.  ?Melonie Germani J Sirenity Shew, MD  ?

## 2019-10-15 ENCOUNTER — Ambulatory Visit: Payer: Medicare Other | Admitting: Family Medicine

## 2019-10-29 ENCOUNTER — Telehealth: Payer: Self-pay

## 2019-10-29 NOTE — Telephone Encounter (Signed)
Left a message regarding appt on 10/30/19.

## 2019-10-30 ENCOUNTER — Telehealth (INDEPENDENT_AMBULATORY_CARE_PROVIDER_SITE_OTHER): Payer: Medicare (Managed Care) | Admitting: Internal Medicine

## 2019-10-30 ENCOUNTER — Other Ambulatory Visit: Payer: Self-pay

## 2019-10-30 DIAGNOSIS — I10 Essential (primary) hypertension: Secondary | ICD-10-CM | POA: Diagnosis not present

## 2019-10-30 DIAGNOSIS — I469 Cardiac arrest, cause unspecified: Secondary | ICD-10-CM | POA: Diagnosis not present

## 2019-10-30 NOTE — Progress Notes (Signed)
Electrophysiology TeleHealth Note  Due to national recommendations of social distancing due to [place] 19, an audio telehealth visit is felt to be most appropriate for this patient at this time.  Verbal consent was obtained by me for the telehealth visit today.  The patient does not have capability for a virtual visit.  A phone visit is therefore required today.   Date:  10/30/2019   ID:  Steven Frey., DOB Jul 27, 1956, MRN [id number]  Location: patient's home  Provider location:  [facility]  Evaluation Performed: Follow-up visit  PCP:  Leeanne Rio, MD   Electrophysiologist:  Dr Rayann Heman  Chief Complaint:  palpitations  History of Present Illness:    Steven Frey. is a 64 y.o. male who presents via telehealth conferencing today.  Since last being seen in our clinic, the patient reports doing very well. Unfortunately, he continues to drink ETOH and smoke.  He struggles with chronic pancreatitis.  He also has chronic stable atypical chest pain.  Today, he denies symptoms of palpitations, exertional chest pain, lower extremity edema, dizziness, presyncope, or syncope.  The patient is otherwise without complaint today.   Past Medical History:  Diagnosis Date  . 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 Anemia   . Angioedema    rx requiring intubation/vent support suspected secondary to ACE1 (but also on zithromax) 5/09 hospitalization   . Anoxic brain damage (HCC)   . Arthritis   . Chronic pain 2011  . Chronic pancreatitis ([place])    (10/08 [place]). admx 02/2010 pseudocyst aspirated during admxn. pancreatic dual stent placement at [facility] 11/11  . Cirrhosis ([name]) 2011   due to ETOH with recent hepatic abcess and possible HCC.   . Colon polyps   . COPD (chronic obstructive pulmonary disease) ([name])    DOE  . Depression with anxiety    no hx of meds  . Diabetes mellitus without complication ([name])    Type 2, pt reports no longer takes meds & A1C was normal.  . Diverticulosis   .  Edema   . ETOH abuse   . Gastritis    alcohol induced  . GERD (gastroesophageal reflux disease)   . History of stroke 2011   reports it was caused by pain from pancreatitis- caused heart to stop and stroke   . Hyperlipidemia 2011  . Hypertension 2011  . Inguinal hernia 2011  . Myocardial infarction [facility]) 2011   in setting of cardiac arrest, no obstructive CAD at cath  . Pneumonia    hx of  . Seizures ([place]) 2011   had seizure 11-24-14/went to ED per pt.   . Smoker   . Stroke [facility])    right sided weakness; affected long term memory  . Syncope and collapse 12/26/2014  . 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 VF (ventricular fibrillation) ([name]) 2011   arrest 6/11. successfully rescucitated w/prolinged hospitalization due to respitatory failure. QT prolongation during cooling, now resolved    Past Surgical History:  Procedure Laterality Date  . CARDIAC CATHETERIZATION  2011   non-obs dz, post-cardiac arrest  . [place]   right  . INGUINAL HERNIA REPAIR Left 07/12/2013   Procedure: HERNIA REPAIR INGUINAL ADULT;  Surgeon: Adin Hector, MD;  Location: [place];  Service: General;  Laterality: Left;  . INSERTION OF MESH Left 07/12/2013   Procedure: INSERTION OF MESH;  Surgeon: Adin Hector, MD;  Location: [place];  Service: General;  Laterality: Left;  . pancreatic stent placement    . right wrist ORIF  1976   for fx    Current Outpatient Medications  Medication Sig Dispense Refill  . acetaminophen (TYLENOL) 500 MG tablet Take 500 mg by mouth 2 (two) times daily as needed for mild pain or headache.     Marland Kitchen amLODipine (NORVASC) 5 MG tablet TAKE 1 TABLET BY MOUTH EVERY DAY (Patient taking differently: Take 5 mg by mouth daily. ) 30 tablet 1  . ASPIRIN ADULT LOW STRENGTH 81 MG EC tablet TAKE 1 TABLET BY MOUTH EVERY DAY (Patient taking differently: Take 81 mg by mouth daily. ) 90 tablet 3  . atorvastatin (LIPITOR) 40 MG tablet TAKE  1 TABLET BY MOUTH EVERY DAY 90 tablet 3  . carvedilol (COREG) 3.125 MG tablet TAKE 1 TABLET BY MOUTH TWICE A DAY WITH MEALS 180 tablet 1  . CV[facility] B-12 1000 MCG TBCR TAKE 1 TABLET (1,000 MCG TOTAL) BY MOUTH DAILY. 90 tablet 0  . folic acid (FOLVITE) 1 MG tablet TAKE 1 TABLET BY MOUTH EVERY DAY 90 tablet 3  . levETIRAcetam (KEPPRA) 500 MG tablet TAKE 1 TABLET BY MOUTH TWICE A DAY 180 tablet 0  . lipase/protease/amylase (CREON) 36000 UNITS CPEP capsule Take 2 capsules with each meal and a snack (total of 8) 240 capsule 6  . pantoprazole (PROTONIX) 40 MG tablet Take 1 tablet (40 mg total) by mouth daily. (Patient not taking: Reported on 09/20/2019) 90 tablet 3  . sertraline (ZOLOFT) 50 MG tablet Take 3 tablets (150 mg total) by mouth daily. 270 tablet 0  . tamsulosin (FLOMAX) 0.4 MG CAPS capsule TAKE 1 CAPSULE BY MOUTH EVERY DAY 90 capsule 0  . thiamine 100 MG tablet TAKE 1 TABLET BY MOUTH EVERY DAY 100 tablet 3   No current facility-administered medications for this visit.    Allergies:   Ace inhibitors, Azithromycin, and Lisinopril   Social History:  The patient  reports that he has been smoking cigarettes. He has been smoking about 0.25 packs per day. He has never used smokeless tobacco. He reports current alcohol use. He reports current drug use. Frequency: 2.00 times per week. Drugs: Marijuana, Heroin, and Cocaine.   Family History:  The patient's family history includes Alcohol abuse in his father, mother, and another family member; Breast cancer in his paternal grandmother; Diabetes in his sister; Early death in his mother; Hyperlipidemia in his sister; Hypertension in his sister; Prostate cancer (age of onset: 33) in his father.   ROS:  Please see the history of present illness.   All other systems are personally reviewed and negative.    Exam:    Vital Signs:  There were no vitals taken for this visit.  Well sounding, alert and conversant  Labs/Other Tests and Data Reviewed:     Recent Labs: 02/26/2019: TSH 1.427 02/27/2019: Magnesium 2.7 06/24/2019: ALT 23; Hemoglobin 11.6; Platelets 229 08/01/2019: BUN 11; Creatinine, Ser 0.91; Potassium 4.2; Sodium 139   Wt Readings from Last 3 Encounters:  09/19/19 115 lb 8 oz (52.4 kg)  08/01/19 114 lb 9.6 oz (52 kg)  06/24/19 115 lb (52.2 kg)     ASSESSMENT & PLAN:    1.  H/o ETOH and tobacco Cessation again advised today  2. HTN Stable No change required today  3. Atypical chest pain stable  4. Prior cardiac arrest In the setting of malnutrition, ETOh, and metabolic derangements No indication for ICD or further EP workup Lifestyle modification is required  Follow-up:  12 months with EP APP   Patient Risk:  after full review of this patients clinical status, I feel that they are at moderate risk at this time.  Today, I have spent 15 minutes with the patient with telehealth technology discussing arrhythmia management .    Army Fossa, MD  10/30/2019 1:48 PM     [address] [place] [address] [phone] (office) [phone] (fax)

## 2019-10-31 ENCOUNTER — Other Ambulatory Visit: Payer: Self-pay | Admitting: Family Medicine

## 2019-11-07 ENCOUNTER — Ambulatory Visit: Payer: Medicare Other | Admitting: Family Medicine

## 2019-11-26 ENCOUNTER — Other Ambulatory Visit: Payer: Self-pay

## 2019-11-26 ENCOUNTER — Ambulatory Visit (INDEPENDENT_AMBULATORY_CARE_PROVIDER_SITE_OTHER): Payer: Medicare (Managed Care) | Admitting: Family Medicine

## 2019-11-26 DIAGNOSIS — E119 Type 2 diabetes mellitus without complications: Secondary | ICD-10-CM | POA: Diagnosis not present

## 2019-11-26 DIAGNOSIS — G8929 Other chronic pain: Secondary | ICD-10-CM | POA: Diagnosis not present

## 2019-11-26 DIAGNOSIS — F329 Major depressive disorder, single episode, unspecified: Secondary | ICD-10-CM

## 2019-11-26 LAB — POCT GLYCOSYLATED HEMOGLOBIN (HGB A1C): HbA1c, POC (controlled diabetic range): 12.4 % — AB (ref 0.0–7.0)

## 2019-11-26 MED ORDER — METFORMIN HCL 500 MG PO TABS: 1000.0000 mg | ORAL_TABLET | Freq: Two times a day (BID) | ORAL | 1 refills | Status: DC

## 2019-11-26 MED ORDER — CLOTRIMAZOLE 1 % EX CREA: 1.0000 "application " | TOPICAL_CREAM | Freq: Two times a day (BID) | CUTANEOUS | 0 refills | Status: DC

## 2019-11-26 NOTE — Patient Instructions (Signed)
Schedule your eye exam  Referring to foot specialist Put cream between your toes  Start metformin. For the first week take one tablet in the morning After that, take one tablet twice a day After that, do two tablets in the morning and one in the evening After that, do two tablets twice daily   Referring to knee specialist and home health  Follow up with me in 1-2 months  Be well, Dr. Ardelia Mems

## 2019-11-26 NOTE — Assessment & Plan Note (Addendum)
A1c increased to 12.4 in the setting of pancreatic insufficiency tx with Creon. Patient hesitant to start medications for diabetes, but after discussion today he is agreeable. Start metformin, given instructions to titrate upward to 1000 mg BID. Anticipate will need other medications but will start with metformin first.  Diabetic foot exam performed and revealed normal sensation with interdigital tinea pedis. Toenails long and in need of care. Will refer to podiatry and begin 1% clotrimazole antifungal cream. Patient tasked with scheduling ophthalmology appointment. Follow up in 1-2 months.

## 2019-11-26 NOTE — Assessment & Plan Note (Addendum)
Well controlled. Continue sertraline.

## 2019-11-26 NOTE — Progress Notes (Signed)
    CHIEF COMPLAINT / HPI: follow up   Depression: - patient's father passed away in 2019-10-15. They lived together and he comments the transition has been difficult. States "some good days and some bad days" - He does have a support system mostly made of family members - Denies SI/HI - States zoloft has continued to help control his mood. Improved after we increased dose to 150mg  daily at last visit. Overall thinks he is coping well despite loss of his father.  Diabetes:  - A1c this morning was 12.4, an increase of his last A1c of 7.0 five months ago - Notably, patient has pancreatic insufficiency treated with Creon. He notes increased weight gain and increased appetite related to his use of Creon  - Recent CT scan November 2020 demonstrated pancreatic changes that are likely associated with known chronic pancreatitis  Chronic Pain: - following his prior stroke, patient has had chronic right sided pain. Today, he complains of mostly right sided knee pain  - he has been taking 1 tablet of tylenol TID which gives him partial relief   PERTINENT  PMH / PSH: anxiety, HTN, HLD, chronic pancreatitis, T2DM, MDD  OBJECTIVE: Physical Exam  Constitutional: He is well-developed, well-nourished, and in no distress. No distress.  Cardiovascular: Normal rate, regular rhythm and normal heart sounds. Exam reveals no gallop and no friction rub.  No murmur heard. Pulmonary/Chest: Effort normal and breath sounds normal. No respiratory distress.  Musculoskeletal:     Right knee: No swelling. No LCL laxity or MCL laxity. mild tenderness with mobility of patella.  Psych: normal range of affect, well groomed, speech normal in rate and volume, normal eye contact   ASSESSMENT / PLAN:  Health maintenance: - foot exam done today - advised to schedule eye exam - patient unable to void specimen today for microalbumin (had just urinated) - per most recent GI note, next colonoscopy between 2023 and 2026 -  updated HM modifiers to colonoscopy q7 years  Major depression, single episode Well controlled. Continue sertraline.  Chronic pain Ongoing chronic pain related to contractures from prior stroke. Will continue management with 500 mg tylenol BID as needed. He is complaining of R sided knee pain. Given his overall musculoskeletal contractures and challenges with mobility, believe he would be best served by seeing orthopedics to see if they recommend any specific adaptive equipment/evaluation. Also refer to home health physical therapy.  Type 2 diabetes mellitus (HCC) A1c increased to 12.4 in the setting of pancreatic insufficiency tx with Creon. Patient hesitant to start medications for diabetes, but after discussion today he is agreeable. Start metformin, given instructions to titrate upward to 1000 mg BID. Anticipate will need other medications but will start with metformin first.  Diabetic foot exam performed and revealed normal sensation with interdigital tinea pedis. Toenails long and in need of care. Will refer to podiatry and begin 1% clotrimazole antifungal cream. Patient tasked with scheduling ophthalmology appointment. Follow up in 1-2 months.    [place]  Patient seen along with MS3 student Maudry Diego. I personally evaluated this patient along with the student, and verified all aspects of the history, physical exam, and medical decision making as documented by the student. I agree with the student's documentation and have made all necessary edits.  Chrisandra Netters, MD [place]

## 2019-11-26 NOTE — Assessment & Plan Note (Addendum)
Ongoing chronic pain related to contractures from prior stroke. Will continue management with 500 mg tylenol BID as needed. He is complaining of R sided knee pain. Given his overall musculoskeletal contractures and challenges with mobility, believe he would be best served by seeing orthopedics to see if they recommend any specific adaptive equipment/evaluation. Also refer to home health physical therapy.

## 2019-12-09 ENCOUNTER — Encounter: Payer: Self-pay | Admitting: Internal Medicine

## 2019-12-21 ENCOUNTER — Other Ambulatory Visit: Payer: Self-pay | Admitting: Family Medicine

## 2019-12-22 ENCOUNTER — Other Ambulatory Visit: Payer: Self-pay | Admitting: Family Medicine

## 2020-01-01 ENCOUNTER — Other Ambulatory Visit: Payer: Self-pay | Admitting: Family Medicine

## 2020-01-06 ENCOUNTER — Other Ambulatory Visit: Payer: Self-pay | Admitting: Family Medicine

## 2020-03-16 ENCOUNTER — Other Ambulatory Visit: Payer: Self-pay | Admitting: Family Medicine

## 2020-03-18 NOTE — Telephone Encounter (Signed)
Attempted to call patient and was not able to get a connection.  Recording states that" number is not in service."  .Ozella Almond, CMA

## 2020-03-18 NOTE — Telephone Encounter (Signed)
Please ask patient to schedule a follow up visit with me.  Thanks! Basem Yannuzzi J Danitza Schoenfeldt, MD  

## 2020-04-05 ENCOUNTER — Telehealth: Payer: Self-pay | Admitting: Family Medicine

## 2020-04-07 NOTE — Telephone Encounter (Signed)
Please ask patient to schedule a follow up visit with me.  Thanks! Slayton Lubitz J Jacqueline Delapena, MD  

## 2020-04-08 NOTE — Telephone Encounter (Signed)
LMOVM for callback. Lianny Molter, CMA  

## 2020-05-10 ENCOUNTER — Other Ambulatory Visit: Payer: Self-pay | Admitting: Family Medicine

## 2020-05-11 NOTE — Telephone Encounter (Signed)
Please ask patient to schedule follow up with me, thanks! Amel Gianino J Shekia Kuper, MD  

## 2020-05-12 NOTE — Telephone Encounter (Signed)
LVM for patient to call office to schedule appointment per Dr. Ardelia Mems.  Steven Frey, [place]

## 2020-06-02 ENCOUNTER — Ambulatory Visit: Payer: Medicare (Managed Care) | Admitting: Family Medicine

## 2020-06-11 ENCOUNTER — Other Ambulatory Visit: Payer: Self-pay | Admitting: Family Medicine

## 2020-06-23 ENCOUNTER — Telehealth: Payer: Self-pay | Admitting: Family Medicine

## 2020-06-23 ENCOUNTER — Ambulatory Visit: Payer: Medicare (Managed Care) | Admitting: Family Medicine

## 2020-06-23 NOTE — Telephone Encounter (Signed)
Called patient to discuss missed appointment from this AM. No answer. LVM asking him to call us back.  If he returns the call please let him know I was checking on him and I'd like him to reschedule his appointment. He is past due for several things.  Thanks Leeanne Rio, MD

## 2020-07-07 ENCOUNTER — Ambulatory Visit: Payer: Medicare (Managed Care) | Admitting: Family Medicine

## 2020-07-25 ENCOUNTER — Other Ambulatory Visit: Payer: Self-pay | Admitting: Family Medicine

## 2020-08-04 ENCOUNTER — Ambulatory Visit: Payer: Medicare (Managed Care) | Admitting: Family Medicine

## 2020-08-05 ENCOUNTER — Other Ambulatory Visit: Payer: Self-pay

## 2020-08-05 ENCOUNTER — Encounter: Payer: Self-pay | Admitting: Family Medicine

## 2020-08-05 ENCOUNTER — Ambulatory Visit (INDEPENDENT_AMBULATORY_CARE_PROVIDER_SITE_OTHER): Payer: Medicare (Managed Care) | Admitting: Family Medicine

## 2020-08-05 VITALS — BP 102/62 | HR 74 | Wt 104.6 lb

## 2020-08-05 DIAGNOSIS — G8110 Spastic hemiplegia affecting unspecified side: Secondary | ICD-10-CM

## 2020-08-05 DIAGNOSIS — E538 Deficiency of other specified B group vitamins: Secondary | ICD-10-CM

## 2020-08-05 DIAGNOSIS — Z23 Encounter for immunization: Secondary | ICD-10-CM | POA: Diagnosis not present

## 2020-08-05 DIAGNOSIS — R634 Abnormal weight loss: Secondary | ICD-10-CM | POA: Diagnosis not present

## 2020-08-05 DIAGNOSIS — K860 Alcohol-induced chronic pancreatitis: Secondary | ICD-10-CM

## 2020-08-05 DIAGNOSIS — E119 Type 2 diabetes mellitus without complications: Secondary | ICD-10-CM

## 2020-08-05 LAB — POCT GLYCOSYLATED HEMOGLOBIN (HGB A1C): Hemoglobin A1C: 6.9 % — AB (ref 4.0–5.6)

## 2020-08-05 NOTE — Progress Notes (Signed)
    SUBJECTIVE:   CHIEF COMPLAINT / HPI:   Last [facility] visit 8 months ago.  Last lab work > year  MOOD He often feels tired and down  PHQ9 is 7 although checked one in question 9.  When he first had his stroke he felt life was not worth living but does not now.     R LIMB PAIN  Nearly constant pain in RUE that was affected by his stroke.  Can't recall ever taking gabapentin   ABDOMINAL PAIN Epigastric chronic.  Worse when he does not take Creon which he has trouble affording Has lost 11 lbs  since December 2020 ALCOHOL  Drinks 1-2 beers a day.  TOBACCO Smokes 1/3 ppd   PERTINENT  PMH / PSH: Attempted to update medication list but he can not recall what he takes and did not bring his medications  Lives alone in [place] but his daughter who accompanies him today live in [place]:   BP 102/62   Pulse 74   Wt 104 lb 9.6 oz (47.4 kg)   SpO2 92%   BMI 17.14 kg/m   Thin male no acute distress Mobility:able to get up and down from exam table with some assistance getting down  Heart - Regular rate and rhythm.  No murmurs, gallops or rubs.    Lungs:  Normal respiratory effort, chest expands symmetrically. Lungs are clear to auscultation, no crackles or wheezes. Extremities:  No cyanosis, edema, RUE significant contractures   ASSESSMENT/PLAN:   Alcohol-induced chronic pancreatitis ([place]) Will need to see if can get assistance with affording enzymes.  Asked him to bring in his medications   Type 2 diabetes mellitus (HCC) A1c is controlled.  Check CMET and CBC  Spastic hemiplegia affecting dominant side (HCC) Seems to have significant pain in his RUE.  Maybe gabapentin candidate.  Await full medication review and recent labs   Abnormal weight loss Worsened.  Check labs and TSH.  Might have a mood component     Lind Covert, MD [place]

## 2020-08-05 NOTE — Assessment & Plan Note (Signed)
Will need to see if can get assistance with affording enzymes.  Asked him to bring in his medications

## 2020-08-05 NOTE — Assessment & Plan Note (Signed)
Seems to have significant pain in his RUE.  Maybe gabapentin candidate.  Await full medication review and recent labs

## 2020-08-05 NOTE — Assessment & Plan Note (Signed)
A1c is controlled.  Check CMET and CBC

## 2020-08-05 NOTE — Assessment & Plan Note (Signed)
Worsened.  Check labs and TSH.  Might have a mood component

## 2020-08-05 NOTE — Patient Instructions (Addendum)
Good to see you today!  Thanks for coming in.  Please bring all your medication bottles next visit  I will call you if your blood tests are concerning other wise discuss with Dr Ardelia Mems when you come back  Could consider a medicine Gabapentin to help with your pain   Consider cutting down and stopping both cigarettes and alcohol  If you start feeling more depressed call us  Make an appointment to see Dr Ardelia Mems in the next few weeks

## 2020-08-06 LAB — CMP14+EGFR
ALT: 18 IU/L (ref 0–44)
AST: 19 IU/L (ref 0–40)
Albumin/Globulin Ratio: 1.5 (ref 1.2–2.2)
Albumin: 4.3 g/dL (ref 3.8–4.8)
Alkaline Phosphatase: 86 IU/L (ref 44–121)
BUN/Creatinine Ratio: 12 (ref 10–24)
BUN: 15 mg/dL (ref 8–27)
Bilirubin Total: <0.2 mg/dL (ref 0.0–1.2)
CO2: 17 mmol/L — ABNORMAL LOW (ref 20–29)
Calcium: 9.4 mg/dL (ref 8.6–10.2)
Chloride: 107 mmol/L — ABNORMAL HIGH (ref 96–106)
Creatinine, Ser: 1.21 mg/dL (ref 0.76–1.27)
GFR calc Af Amer: 73 mL/min/{1.73_m2} (ref >59)
GFR calc non Af Amer: 63 mL/min/{1.73_m2} (ref >59)
Globulin, Total: 2.9 g/dL (ref 1.5–4.5)
Glucose: 137 mg/dL — ABNORMAL HIGH (ref 65–99)
Potassium: 5.2 mmol/L (ref 3.5–5.2)
Sodium: 141 mmol/L (ref 134–144)
Total Protein: 7.2 g/dL (ref 6.0–8.5)

## 2020-08-06 LAB — CBC
Hematocrit: 30.6 % — ABNORMAL LOW (ref 37.5–51.0)
Hemoglobin: 9.9 g/dL — ABNORMAL LOW (ref 13.0–17.7)
MCH: 30.8 pg (ref 26.6–33.0)
MCHC: 32.4 g/dL (ref 31.5–35.7)
MCV: 95 fL (ref 79–97)
Platelets: 255 10*3/uL (ref 150–450)
RBC: 3.21 x10E6/uL — ABNORMAL LOW (ref 4.14–5.80)
RDW: 12.6 % (ref 11.6–15.4)
WBC: 8.2 10*3/uL (ref 3.4–10.8)

## 2020-08-06 LAB — VITAMIN B12: Vitamin B-12: 727 pg/mL (ref 232–1245)

## 2020-08-06 LAB — TSH: TSH: 3.32 u[IU]/mL (ref 0.450–4.500)

## 2020-08-18 ENCOUNTER — Other Ambulatory Visit: Payer: Self-pay | Admitting: Family Medicine

## 2020-08-21 ENCOUNTER — Other Ambulatory Visit: Payer: Self-pay | Admitting: Family Medicine

## 2020-08-25 ENCOUNTER — Ambulatory Visit: Payer: Medicare (Managed Care) | Admitting: Family Medicine

## 2020-08-25 DIAGNOSIS — E119 Type 2 diabetes mellitus without complications: Secondary | ICD-10-CM

## 2020-08-25 DIAGNOSIS — D649 Anemia, unspecified: Secondary | ICD-10-CM

## 2020-08-27 ENCOUNTER — Telehealth: Payer: Self-pay | Admitting: Internal Medicine

## 2020-08-28 MED ORDER — PANTOPRAZOLE SODIUM 40 MG PO TBEC
40.0000 mg | DELAYED_RELEASE_TABLET | Freq: Every day | ORAL | 3 refills | Status: DC
Start: 2020-08-28 — End: 2020-10-16

## 2020-08-28 MED ORDER — PANCRELIPASE (LIP-PROT-AMYL) 36000-114000 UNITS PO CPEP
ORAL_CAPSULE | ORAL | 6 refills | Status: DC
Start: 2020-08-28 — End: 2020-09-25

## 2020-08-28 NOTE — Telephone Encounter (Signed)
Creon and Protonix refilled

## 2020-09-05 ENCOUNTER — Telehealth: Payer: Self-pay | Admitting: Family Medicine

## 2020-09-07 NOTE — Telephone Encounter (Signed)
Please ask patient to schedule (and keep) a follow up appointment with me. He has canceled or no-showed to all appts with me since February. I need to see him in order to continue filling his medications.  Thanks Leeanne Rio, MD

## 2020-09-10 NOTE — Telephone Encounter (Signed)
Attempted to call pt, no answer/voicemail set up. Will try again later. Steaven Wholey Kennon Holter, CMA

## 2020-09-14 NOTE — Telephone Encounter (Signed)
Attempted to reach patient x 2 on number listed in chart.  No answer and no ability to leave voice mail.  Messages can be left with patient's daughter Corey Skains or sister Vaughan Basta.  No answer at [name]'s number and no voicemail.  Called Corey Skains (daughter) and LVM informing her to have patient call office concerning further RX's and to make an appointment with Dr. Ardelia Mems.  Will await call back.  Ozella Almond, [place]

## 2020-09-20 ENCOUNTER — Other Ambulatory Visit: Payer: Self-pay | Admitting: Family Medicine

## 2020-09-24 NOTE — Telephone Encounter (Signed)
Pt's daughter is calling states the pt's pharmacy has not received the pt's Creon medication.

## 2020-09-25 MED ORDER — PANCRELIPASE (LIP-PROT-AMYL) 36000-114000 UNITS PO CPEP
ORAL_CAPSULE | ORAL | 6 refills | Status: DC
Start: 2020-09-25 — End: 2020-10-16

## 2020-09-25 NOTE — Addendum Note (Signed)
Addended by: Audrea Muscat on: 09/25/2020 09:33 AM   Modules accepted: Orders

## 2020-09-25 NOTE — Telephone Encounter (Signed)
Resent Creon to [facility] on [address].

## 2020-10-16 ENCOUNTER — Encounter: Payer: Self-pay | Admitting: Internal Medicine

## 2020-10-16 ENCOUNTER — Ambulatory Visit (INDEPENDENT_AMBULATORY_CARE_PROVIDER_SITE_OTHER): Payer: Medicare (Managed Care) | Admitting: Internal Medicine

## 2020-10-16 VITALS — BP 110/70 | HR 68 | Ht 65.75 in | Wt 106.1 lb

## 2020-10-16 DIAGNOSIS — K861 Other chronic pancreatitis: Secondary | ICD-10-CM | POA: Diagnosis not present

## 2020-10-16 DIAGNOSIS — K219 Gastro-esophageal reflux disease without esophagitis: Secondary | ICD-10-CM | POA: Diagnosis not present

## 2020-10-16 DIAGNOSIS — R197 Diarrhea, unspecified: Secondary | ICD-10-CM

## 2020-10-16 DIAGNOSIS — R634 Abnormal weight loss: Secondary | ICD-10-CM | POA: Diagnosis not present

## 2020-10-16 DIAGNOSIS — K8689 Other specified diseases of pancreas: Secondary | ICD-10-CM

## 2020-10-16 DIAGNOSIS — F1020 Alcohol dependence, uncomplicated: Secondary | ICD-10-CM

## 2020-10-16 DIAGNOSIS — R1084 Generalized abdominal pain: Secondary | ICD-10-CM

## 2020-10-16 MED ORDER — PANCRELIPASE (LIP-PROT-AMYL) 36000-114000 UNITS PO CPEP
ORAL_CAPSULE | ORAL | 11 refills | Status: DC
Start: 2020-10-16 — End: 2022-12-12

## 2020-10-16 NOTE — Patient Instructions (Signed)
We have sent the following medications to your pharmacy for you to pick up at your convenience:  Creon  Please follow up in one year.

## 2020-10-16 NOTE — Progress Notes (Signed)
HISTORY OF PRESENT ILLNESS:  Steven Frey. is a 65 y.o. male with multiple medical problems including chronic pancreatitis secondary to chronic alcoholism with associated pancreatic insufficiency, chronic GERD, and weight loss secondary to [place].  He was last seen in this office December 2020.  At that time his diarrhea resolved on Creon.  He gained weight.  As well reflux symptoms were improved on daily PPI.  He was told to discontinue alcohol and tobacco.  He was told to continue his medications.  He was told to follow-up in 1 year.  Patient is accompanied by his daughter.  He ran out of Creon and requests a refill.  Creon he has had recurrent diarrhea and weight loss.  At some point he stopped his PPI.  He is having intermittent reflux symptoms.  He is not interested in resuming PPI therapy.  He has continued to smoke and drink alcohol.  He admits to 2 beers per day.  His last colonoscopy 2016 revealed 1 diminutive adenoma.  REVIEW OF SYSTEMS:  All non-GI ROS negative unless otherwise stated in the HPI except for... Essentially positive none GI review of systems except for hematuria, breast change, heart murmur, and voice change  Past Medical History:  Diagnosis Date  . 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 Anemia   . Angioedema    rx requiring intubation/vent support suspected secondary to ACE1 (but also on zithromax) 5/09 hospitalization   . Anoxic brain damage (HCC)   . Arthritis   . Chronic pain 2011  . Chronic pancreatitis ([place])    (10/08 hops). admx 02/2010 pseudocyst aspirated during admxn. pancreatic dual stent placement at [facility] 11/11  . Cirrhosis ([place]) 2011   due to ETOH with recent hepatic abcess and possible HCC.   . Colon polyps   . COPD (chronic obstructive pulmonary disease) ([place])    DOE  . Depression with anxiety    no hx of meds  . Diabetes mellitus without complication ([place])    Type 2, pt reports no longer takes meds & A1C was normal.  . Diverticulosis   . Edema   .  ETOH abuse   . Gastritis    alcohol induced  . GERD (gastroesophageal reflux disease)   . History of stroke 2011   reports it was caused by pain from pancreatitis- caused heart to stop and stroke   . Hyperlipidemia 2011  . Hypertension 2011  . Inguinal hernia 2011  . Myocardial infarction [facility]) 2011   in setting of cardiac arrest, no obstructive CAD at cath  . Pneumonia    hx of  . Seizures ([place]) 2011   had seizure 11-24-14/went to ED per pt.   . Smoker   . Stroke [facility])    right sided weakness; affected long term memory  . Syncope and collapse 12/26/2014  . 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 VF (ventricular fibrillation) ([place]) 2011   arrest 6/11. successfully rescucitated w/prolinged hospitalization due to respitatory failure. QT prolongation during cooling, now resolved    Past Surgical History:  Procedure Laterality Date  . CARDIAC CATHETERIZATION  2011   non-obs dz, post-cardiac arrest  . [place]   right  . INGUINAL HERNIA REPAIR Left 07/12/2013   Procedure: HERNIA REPAIR INGUINAL ADULT;  Surgeon: Adin Hector, MD;  Location: [place];  Service: General;  Laterality: Left;  . INSERTION OF MESH Left 07/12/2013   Procedure: INSERTION OF MESH;  Surgeon: Adin Hector, MD;  Location: [facility] OR;  Service: General;  Laterality: Left;  . pancreatic stent placement    . right wrist ORIF  1976   for fx    Social History [place].  reports that he has been smoking cigarettes. He has been smoking about 0.25 packs per day. He has never used smokeless tobacco. He reports current alcohol use. He reports current drug use. Frequency: 2.00 times per week. Drugs: Marijuana, Heroin, and Cocaine.  family history includes Alcohol abuse in his father, mother, and another family member; Breast cancer in his paternal grandmother; Diabetes in his sister; Early death in his mother; Hyperlipidemia in his sister; Hypertension in his  sister; Prostate cancer (age of onset: 40) in his father.  Allergies  Allergen Reactions  . Ace Inhibitors Anaphylaxis and Hives    Other reaction(s): Other (See Comments) On MAR   . Azithromycin Anaphylaxis and Swelling    Throat swelling, body swelling  . Lisinopril Anaphylaxis, Hives and Swelling    REACTION: facial/neck edema       PHYSICAL EXAMINATION: Vital signs: BP 110/70 (BP Location: Left Arm, Patient Position: Sitting, Cuff Size: Normal)   Pulse 68   Ht 5' 5.75" (1.67 m) Comment: height measured without shoes  Wt 106 lb 2 oz (48.1 kg)   BMI 17.26 kg/m   Constitutional: Thin, chronically ill-appearing male, no acute distress Psychiatric: alert and oriented x3, cooperative Eyes: extraocular movements intact, anicteric, conjunctiva pink Mouth: Mask Neck: supple no lymphadenopathy Cardiovascular: heart regular rate and rhythm, no murmur Lungs: clear to auscultation bilaterally Abdomen: soft, nontender, nondistended, no obvious ascites, no peritoneal signs, normal bowel sounds, no organomegaly.  Scaphoid abdomen Rectal: Omitted Extremities: no clubbing, cyanosis, or lower extremity edema bilaterally Skin: no lesions on visible extremities Neuro: Right-sided weakness and contracture secondary to stroke.  No nerves intact  ASSESSMENT:  1.  Chronic alcoholic pancreatitis with pancreatic insufficiency.  Ongoing.  Worse off Creon 2.  GERD.  Active symptoms off PPI 3.  Ongoing alcohol and tobacco abuse 4.  Multiple significant medical problems 5.  Diminutive colonic adenoma 2016  PLAN:  1.  Stop drinking alcohol 2.  Stop smoking 3.  Refill Creon.  72,000 units with meals and snacks 4.  Reflux precautions 5.  Resume PPI if the patient wishes for severe reflux symptoms 6.  Recall colonoscopy 2026 based on most recent guidelines. 7.  Routine GI follow-up 1 year 8.  Ongoing general medical care with [facility]

## 2020-10-22 ENCOUNTER — Other Ambulatory Visit: Payer: Self-pay | Admitting: Family Medicine

## 2020-10-22 NOTE — Telephone Encounter (Signed)
Please ask patient to schedule a follow up visit with me, thanks! Steven Wamble J Cherylee Rawlinson, MD  

## 2020-10-23 ENCOUNTER — Telehealth: Payer: Self-pay

## 2020-10-23 NOTE — Telephone Encounter (Signed)
LVM for patient to call [facility] and schedule a follow up appointment.  Steven Frey, [place]

## 2020-10-27 ENCOUNTER — Other Ambulatory Visit: Payer: Self-pay | Admitting: Family Medicine

## 2020-11-01 ENCOUNTER — Other Ambulatory Visit: Payer: Self-pay | Admitting: Family Medicine

## 2020-11-10 ENCOUNTER — Other Ambulatory Visit: Payer: Self-pay | Admitting: Family Medicine

## 2020-11-12 NOTE — Telephone Encounter (Signed)
Spoke with pt daughter and scheduled him an appt. Kathyjo Briere Kennon Holter, CMA

## 2020-11-16 ENCOUNTER — Other Ambulatory Visit: Payer: Self-pay | Admitting: Family Medicine

## 2020-11-23 NOTE — Progress Notes (Signed)
  Date of Visit: 11/24/2020   SUBJECTIVE:   HPI:  Steven Frey presents today for routine follow up. He is accompanied by his daughter Steven Frey [phone]).  Weight loss - has had decreased appetite over the last 2 weeks. Has lost considerable weight since then. He weighed 106.13lb on 1/7 at the GI office, and now about 6 weeks later is weighing 91.4lb. Denies having pain in his abdomen, just feeling not hungry. Endorses night sweats on and off for the last year or so. No fevers. He smokes 1-1.5 cigarettes per day. Drinks 12oz of beer daily, denies any other drug use.   Hypertension - currently taking amlodipine 5mg  daily, coreg 3.125mg  twice daily. Does not check blood pressure at home  Diabetes - currently taking no medications. Ran out of metformin about a month ago, had been on 1000mg  twice daily of this.   Anemia - Hgb 9.9 when last checked. Is not on iron supplements.  BPH - has been out of flomax but was previously on 0.4mg  daily. Was helping his symptoms considerably.  Depression - currently taking zoloft 150mg  daily. Tolerating well, feels mood is well controlled. No SI/HI.  Seizure disorder - currently taking keppra 500mg  twice daily. No recent seizures.  Hyperlipidemia - taking atorvastatin 40mg  daily. Ate a smoothie this morning.   OBJECTIVE:   BP 92/62   Pulse (!) 102   Ht 5' 5.5" (1.664 m)   Wt 91 lb 6.4 oz (41.5 kg)   SpO2 97%   BMI 14.98 kg/m  Gen: no acute distress. Appears very malnourished and weak. HEENT: normocephalic, atraumatic. Temporal wasting present. No neck masses or lymphadenopathy palpable. Oral mucosa normal in appearance. Heart: regular rate and rhythm, no murmur Lungs: clear to auscultation bilaterally, normal work of breathing  Neuro: R arm contracted at baseline. Otherwise grossly nonfocal Abdomen: tender to palpation in epigastric area. No masses. Ext: No appreciable lower extremity edema bilaterally   ASSESSMENT/PLAN:   Health  maintenance:  -COVID vaccine: Pfizer booster given today (got J&J 12/19/19). Despite patient not feeling well, opted to get booster today as with his current frail state I worry he would not fare well should he contract COVID.  -eye exam: advised to schedule -urine micro: collected today  Major depression, single episode Adequate control. Continue zoloft.  Polyuria Refill flomax  HYPERTENSION, BENIGN Blood pressure low today, likely due to malnourishment. Stop amlodipine. Continue coreg.   Hyperlipidemia Check lipids today with labs. Continue statin.  Abnormal weight loss I am very concerned about his weight loss. He appears chronically ill and malnourished. Given history of alcoholic chronic pancreatitis, concern is for underlying pancreatic malignancy. Also smoking history does put him at risk for lung cancer or other general cancers. Abdominal exam with tenderness in epigastric area, despite him not complaining of outright abdominal pain. Will do broad workup to evaluate causes of significant weight loss and have him follow up in 2 weeks. Labs: CMET,CBC with diff, PSA, TSH, UA, HIV antibody, quant gold, lipase Also check CT chest/abdomen/pelvis.  Anemia Last Hgb 9.9. update Hgb today, as well as iron studies, b12, and folate.  Seizure disorder ([place]) Stable on keppra. Continue.  Type 2 diabetes mellitus (HCC) A1c 6.3 today despite being out of metformin for 1 month. Continue off metformin for now. Want to avoid any contributory GI upset that may occur from restarting.  FOLLOW UP: Follow up in 2 weeks for above issues.  [place]. Ardelia Mems, [place]

## 2020-11-24 ENCOUNTER — Ambulatory Visit (INDEPENDENT_AMBULATORY_CARE_PROVIDER_SITE_OTHER): Payer: Medicare (Managed Care)

## 2020-11-24 ENCOUNTER — Encounter: Payer: Self-pay | Admitting: Family Medicine

## 2020-11-24 ENCOUNTER — Ambulatory Visit (INDEPENDENT_AMBULATORY_CARE_PROVIDER_SITE_OTHER): Payer: Medicare (Managed Care) | Admitting: Family Medicine

## 2020-11-24 ENCOUNTER — Other Ambulatory Visit: Payer: Self-pay

## 2020-11-24 VITALS — BP 92/62 | HR 102 | Ht 65.5 in | Wt 91.4 lb

## 2020-11-24 DIAGNOSIS — D649 Anemia, unspecified: Secondary | ICD-10-CM

## 2020-11-24 DIAGNOSIS — G40909 Epilepsy, unspecified, not intractable, without status epilepticus: Secondary | ICD-10-CM

## 2020-11-24 DIAGNOSIS — E119 Type 2 diabetes mellitus without complications: Secondary | ICD-10-CM | POA: Diagnosis not present

## 2020-11-24 DIAGNOSIS — I10 Essential (primary) hypertension: Secondary | ICD-10-CM

## 2020-11-24 DIAGNOSIS — Z23 Encounter for immunization: Secondary | ICD-10-CM | POA: Diagnosis not present

## 2020-11-24 DIAGNOSIS — E785 Hyperlipidemia, unspecified: Secondary | ICD-10-CM

## 2020-11-24 DIAGNOSIS — F329 Major depressive disorder, single episode, unspecified: Secondary | ICD-10-CM | POA: Diagnosis not present

## 2020-11-24 DIAGNOSIS — R634 Abnormal weight loss: Secondary | ICD-10-CM

## 2020-11-24 DIAGNOSIS — R3589 Other polyuria: Secondary | ICD-10-CM

## 2020-11-24 LAB — POCT UA - MICROSCOPIC ONLY

## 2020-11-24 LAB — POCT URINALYSIS DIP (MANUAL ENTRY)
Bilirubin, UA: NEGATIVE
Blood, UA: NEGATIVE
Glucose, UA: 100 mg/dL — AB
Ketones, POC UA: NEGATIVE mg/dL
Leukocytes, UA: NEGATIVE
Nitrite, UA: NEGATIVE
Protein Ur, POC: 30 mg/dL — AB
Spec Grav, UA: 1.015 (ref 1.010–1.025)
Urobilinogen, UA: 0.2 E.U./dL
pH, UA: 5 (ref 5.0–8.0)

## 2020-11-24 LAB — POCT GLYCOSYLATED HEMOGLOBIN (HGB A1C): Hemoglobin A1C: 6.3 % — AB (ref 4.0–5.6)

## 2020-11-24 NOTE — Patient Instructions (Addendum)
It was great to see you again today!  We are checking a lot of bloodwork today to look into why you are losing so much weight. Get some boost shakes and drink them regularly  Let's hold off on restarting metformin for now Stop amlodipine.  COVID booster today  Be well, Dr. Ardelia Mems

## 2020-11-24 NOTE — Assessment & Plan Note (Signed)
Adequate control. Continue zoloft.

## 2020-11-24 NOTE — Assessment & Plan Note (Signed)
Blood pressure low today, likely due to malnourishment. Stop amlodipine. Continue coreg.

## 2020-11-24 NOTE — Assessment & Plan Note (Signed)
Check lipids today with labs. Continue statin.

## 2020-11-24 NOTE — Assessment & Plan Note (Signed)
Stable on keppra. Continue.

## 2020-11-24 NOTE — Assessment & Plan Note (Signed)
Last Hgb 9.9. update Hgb today, as well as iron studies, b12, and folate.

## 2020-11-24 NOTE — Assessment & Plan Note (Signed)
A1c 6.3 today despite being out of metformin for 1 month. Continue off metformin for now. Want to avoid any contributory GI upset that may occur from restarting.

## 2020-11-24 NOTE — Progress Notes (Signed)
   [id number]  Name:  Steven Frey.    MRN: [id number] DOB: 10/11/1955  11/24/2020  [name] was observed post Covid-19 immunization for 15 minutes without incident. He was provided with Vaccine Information Sheet and instruction to access the V-Safe system.   [name] was instructed to call 911 with any severe reactions post vaccine: Marland Kitchen Difficulty breathing  . Swelling of face and throat  . A fast heartbeat  . A bad rash all over body  . Dizziness and weakness

## 2020-11-24 NOTE — Assessment & Plan Note (Signed)
Refill flomax

## 2020-11-24 NOTE — Assessment & Plan Note (Addendum)
I am very concerned about his weight loss. He appears chronically ill and malnourished. Given history of alcoholic chronic pancreatitis, concern is for underlying pancreatic malignancy. Also smoking history does put him at risk for lung cancer or other general cancers. Abdominal exam with tenderness in epigastric area, despite him not complaining of outright abdominal pain. Will do broad workup to evaluate causes of significant weight loss and have him follow up in 2 weeks. Labs: CMET,CBC with diff, PSA, TSH, UA, HIV antibody, quant gold, lipase Also check CT chest/abdomen/pelvis.

## 2020-11-25 ENCOUNTER — Telehealth: Payer: Self-pay | Admitting: Family Medicine

## 2020-11-25 LAB — MICROALBUMIN / CREATININE URINE RATIO
Creatinine, Urine: 61.8 mg/dL
Microalb/Creat Ratio: 280 mg/g creat — ABNORMAL HIGH (ref 0–29)
Microalbumin, Urine: 172.9 ug/mL

## 2020-11-25 LAB — HIV ANTIBODY (ROUTINE TESTING W REFLEX): HIV Screen 4th Generation wRfx: NONREACTIVE

## 2020-11-25 NOTE — Telephone Encounter (Signed)
Called patient's daughter Latavious Bitter regarding patient's labwork.  Labs show significant AKI with creatinine of 5.38, BUN of 83, CO2 of 5.  Advised he needs to go to the ED urgently due to acute renal failure.  She reports they are in [place] (she moved him to [facility] recently). Advised he go to [facility] ED as they will be able to see our records through [place].  Provided Jada with labs to write down (creat, BUN, CO2) to share with ED provider to get them started on workup.  Daughter appreciative  Leeanne Rio, MD

## 2020-11-26 ENCOUNTER — Telehealth: Payer: Self-pay | Admitting: Family Medicine

## 2020-11-26 LAB — CMP14+EGFR
ALT: 16 IU/L (ref 0–44)
AST: 13 IU/L (ref 0–40)
Albumin/Globulin Ratio: 2.1 (ref 1.2–2.2)
Albumin: 4.9 g/dL — ABNORMAL HIGH (ref 3.8–4.8)
Alkaline Phosphatase: 86 IU/L (ref 44–121)
BUN/Creatinine Ratio: 15 (ref 10–24)
BUN: 83 mg/dL (ref 8–27)
Bilirubin Total: 0.3 mg/dL (ref 0.0–1.2)
CO2: 5 mmol/L — CL (ref 20–29)
Calcium: 9.1 mg/dL (ref 8.6–10.2)
Chloride: 106 mmol/L (ref 96–106)
Creatinine, Ser: 5.38 mg/dL — ABNORMAL HIGH (ref 0.76–1.27)
GFR calc Af Amer: 12 mL/min/{1.73_m2} — ABNORMAL LOW (ref >59)
GFR calc non Af Amer: 10 mL/min/{1.73_m2} — ABNORMAL LOW (ref >59)
Globulin, Total: 2.3 g/dL (ref 1.5–4.5)
Glucose: 340 mg/dL — ABNORMAL HIGH (ref 65–99)
Potassium: 4.7 mmol/L (ref 3.5–5.2)
Sodium: 138 mmol/L (ref 134–144)
Total Protein: 7.2 g/dL (ref 6.0–8.5)

## 2020-11-26 LAB — LIPID PANEL
Chol/HDL Ratio: 1.8 ratio (ref 0.0–5.0)
Cholesterol, Total: 76 mg/dL — ABNORMAL LOW (ref 100–199)
HDL: 42 mg/dL (ref >39)
LDL Chol Calc (NIH): 14 mg/dL (ref 0–99)
Triglycerides: 108 mg/dL (ref 0–149)
VLDL Cholesterol Cal: 20 mg/dL (ref 5–40)

## 2020-11-26 LAB — CBC WITH DIFFERENTIAL/PLATELET
Basophils Absolute: 0 10*3/uL (ref 0.0–0.2)
Basos: 0 %
EOS (ABSOLUTE): 0 10*3/uL (ref 0.0–0.4)
Eos: 1 %
Hematocrit: 27.3 % — ABNORMAL LOW (ref 37.5–51.0)
Hemoglobin: 8.7 g/dL — ABNORMAL LOW (ref 13.0–17.7)
Immature Grans (Abs): 0 10*3/uL (ref 0.0–0.1)
Immature Granulocytes: 0 %
Lymphocytes Absolute: 1.2 10*3/uL (ref 0.7–3.1)
Lymphs: 14 %
MCH: 29.9 pg (ref 26.6–33.0)
MCHC: 31.9 g/dL (ref 31.5–35.7)
MCV: 94 fL (ref 79–97)
Monocytes Absolute: 0.5 10*3/uL (ref 0.1–0.9)
Monocytes: 5 %
NRBC: 1 % — ABNORMAL HIGH (ref 0–0)
Neutrophils Absolute: 7 10*3/uL (ref 1.4–7.0)
Neutrophils: 80 %
Platelets: 206 10*3/uL (ref 150–450)
RBC: 2.91 x10E6/uL — ABNORMAL LOW (ref 4.14–5.80)
RDW: 12 % (ref 11.6–15.4)
WBC: 8.8 10*3/uL (ref 3.4–10.8)

## 2020-11-26 LAB — QUANTIFERON-TB GOLD PLUS
QuantiFERON Mitogen Value: 0.98 IU/mL
QuantiFERON Nil Value: 0.03 IU/mL
QuantiFERON TB1 Ag Value: 0.87 IU/mL
QuantiFERON TB2 Ag Value: 0.01 IU/mL
QuantiFERON-TB Gold Plus: POSITIVE — AB

## 2020-11-26 LAB — VITAMIN B12: Vitamin B-12: 996 pg/mL (ref 232–1245)

## 2020-11-26 LAB — IRON AND TIBC
Iron Saturation: 89 % (ref 15–55)
Iron: 177 ug/dL — ABNORMAL HIGH (ref 38–169)
Total Iron Binding Capacity: 200 ug/dL — ABNORMAL LOW (ref 250–450)
UIBC: 23 ug/dL — ABNORMAL LOW (ref 111–343)

## 2020-11-26 LAB — FERRITIN: Ferritin: 816 ng/mL — ABNORMAL HIGH (ref 30–400)

## 2020-11-26 LAB — FOLATE: Folate: 14.3 ng/mL (ref >3.0)

## 2020-11-26 LAB — PSA: Prostate Specific Ag, Serum: 1.6 ng/mL (ref 0.0–4.0)

## 2020-11-26 LAB — LIPASE: Lipase: 11 U/L — ABNORMAL LOW (ref 13–78)

## 2020-11-26 LAB — TSH: TSH: 2.98 u[IU]/mL (ref 0.450–4.500)

## 2020-11-26 NOTE — Telephone Encounter (Signed)
Patient currently admitted in ICU at [facility] - records visible in [place]. I called [facility] and reached physician taking care of patient, to let them know patient's quant gold has returned positive. They will get chest imaging on him.  Appreciate excellent care of his team at [facility].  Leeanne Rio, MD

## 2020-12-01 ENCOUNTER — Ambulatory Visit (HOSPITAL_COMMUNITY): Payer: Medicare (Managed Care)

## 2020-12-15 ENCOUNTER — Ambulatory Visit: Payer: Medicare (Managed Care) | Admitting: Family Medicine

## 2020-12-20 ENCOUNTER — Other Ambulatory Visit: Payer: Self-pay | Admitting: Family Medicine

## 2020-12-22 ENCOUNTER — Telehealth: Payer: Self-pay

## 2020-12-22 NOTE — Telephone Encounter (Signed)
Transition Care Management Unsuccessful Follow-up Telephone Call  Date of discharge and from where:  0314/2022 from [facility]  Attempts:  1st Attempt  Reason for unsuccessful TCM follow-up call:  Left voice message   Pt was admitted for 30 days at [facility].

## 2020-12-23 NOTE — Telephone Encounter (Signed)
Transition Care Management Unsuccessful Follow-up Telephone Call  Date of discharge and from where:  12/21/2020 [facility]  Attempts:  2nd Attempt  Reason for unsuccessful TCM follow-up call:  Left voice message

## 2020-12-24 NOTE — Telephone Encounter (Signed)
Called and spoke with daughter Corey Skains Patient does not need refill of this - he just got discharged from [place] a few days ago and is at a rehabilitation facility Was started on dialysis while at [facility] and several of his medications were changed Will not refill for now. Jada agreeable and will schedule him a follow up appointment when he is discharged from SNF.

## 2020-12-24 NOTE — Telephone Encounter (Signed)
Transition Care Management Unsuccessful Follow-up Telephone Call  Date of discharge and from where:  12/21/20 from [facility]  Attempts:  3rd Attempt  Reason for unsuccessful TCM follow-up call:  Left voice message

## 2021-02-15 NOTE — Progress Notes (Signed)
PCP:  Leeanne Rio, MD Primary Cardiologist: None Electrophysiologist: Thompson Grayer, MD   Steven Frey. is a 64 y.o. male seen today for Thompson Grayer, MD for routine electrophysiology followup.    Pt recently admitted to [facility] for acute renal failure. 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 He has now been started on iHD twice weekly, recently bumped up from once weekly. 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 He would prefer to keep his cardiac care in [place] for now, despite having moved to [facility]. He is overall doing well after prolonged admission. Not very active. Has chronic atypical chest pain. Not clearly related to exertion. Stable over years, and not especially exacerbated with recent admission/critical illness, which is a reassuring sign. No SOB but as above not very active. Denies peripheral edema or syncope.   Past Medical History:  Diagnosis Date  . 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 Anemia   . Angioedema    rx requiring intubation/vent support suspected secondary to ACE1 (but also on zithromax) 5/09 hospitalization   . Anoxic brain damage (HCC)   . Arthritis   . Chronic pain 2011  . Chronic pancreatitis ([place])    (10/08 hops). admx 02/2010 pseudocyst aspirated during admxn. pancreatic dual stent placement at [facility] 11/11  . 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   . Colon polyps   . COPD (chronic obstructive pulmonary disease) ([place])    DOE  . Depression with anxiety    no hx of meds  . Diabetes mellitus without complication ([place])    Type 2, pt reports no longer takes meds & A1C was normal.  . Diverticulosis   . Edema   . ETOH abuse   . Gastritis    alcohol induced  . GERD (gastroesophageal reflux disease)   . History of stroke 2011   reports it was caused by pain from pancreatitis- caused heart to stop and stroke   . Hyperlipidemia 2011  . Hypertension 2011  . Inguinal hernia 2011  . Myocardial infarction [facility]) 2011    in setting of cardiac arrest, no obstructive CAD at cath  . Pneumonia    hx of  . Seizures ([place]) 2011   had seizure 11-24-14/went to ED per pt.   . Smoker   . Stroke [facility])    right sided weakness; affected long term memory  . Syncope and collapse 12/26/2014  . 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 VF (ventricular fibrillation) ([place]) 2011   arrest 6/11. successfully rescucitated w/prolinged hospitalization due to respitatory failure. QT prolongation during cooling, now resolved   Past Surgical History:  Procedure Laterality Date  . CARDIAC CATHETERIZATION  2011   non-obs dz, post-cardiac arrest  . [place]   right  . INGUINAL HERNIA REPAIR Left 07/12/2013   Procedure: HERNIA REPAIR INGUINAL ADULT;  Surgeon: Adin Hector, MD;  Location: [place];  Service: General;  Laterality: Left;  . INSERTION OF MESH Left 07/12/2013   Procedure: INSERTION OF MESH;  Surgeon: Adin Hector, MD;  Location: [place];  Service: General;  Laterality: Left;  . pancreatic stent placement    . right wrist ORIF  1976   for fx    Current Outpatient Medications  Medication Sig Dispense Refill  . acetaminophen (TYLENOL) 500 MG tablet Take 500 mg by mouth 2 (two) times daily as needed for mild pain or headache.     Marland Kitchen  ASPIRIN LOW DOSE 81 MG EC tablet TAKE 1 TABLET BY MOUTH EVERY DAY 90 tablet 0  . atorvastatin (LIPITOR) 40 MG tablet TAKE 1 TABLET BY MOUTH EVERY DAY 90 tablet 0  . Calcium Carb-Cholecalciferol 600-400 MG-UNIT TABS Take by mouth in the morning and at bedtime.    . carvedilol (COREG) 3.125 MG tablet TAKE 1 TABLET BY MOUTH TWICE A DAY WITH MEALS 180 tablet 0  . clotrimazole (CLOTRIMAZOLE ANTI-FUNGAL) 1 % cream Apply 1 application topically 2 (two) times daily. 30 g 0  . folic acid (FOLVITE) 1 MG tablet TAKE 1 TABLET BY MOUTH EVERY DAY 90 tablet 3  . Insulin Glargine-yfgn 100 UNIT/ML SOLN Inject into the skin daily in the afternoon.    .  insulin lispro (HUMALOG) 100 UNIT/ML injection Inject into the skin 3 (three) times daily.    Marland Kitchen levETIRAcetam (KEPPRA) 500 MG tablet TAKE 1 TABLET BY MOUTH TWICE A DAY 180 tablet 0  . lipase/protease/amylase (CREON) 36000 UNITS CPEP capsule Take 2 capsules with each meal and a snack (total of 8) 240 capsule 11  . polyethylene glycol powder (GLYCOLAX/MIRALAX) 17 GM/SCOOP powder Take by mouth as needed.    . predniSONE (DELTASONE) 10 MG tablet Take by mouth as directed.    . sertraline (ZOLOFT) 50 MG tablet TAKE 3 TABLETS BY MOUTH EVERY DAY 270 tablet 0  . tamsulosin (FLOMAX) 0.4 MG CAPS capsule TAKE 1 CAPSULE BY MOUTH EVERY DAY 90 capsule 0   No current facility-administered medications for this visit.    Allergies  Allergen Reactions  . Ace Inhibitors Anaphylaxis and Hives    Other reaction(s): Other (See Comments) On MAR   . Azithromycin Anaphylaxis and Swelling    Throat swelling, body swelling  . Lisinopril Anaphylaxis, Hives and Swelling    REACTION: facial/neck edema    Social History   Socioeconomic History  . Marital status: Single    Spouse name: Not on file  . Number of children: 2  . Years of education: 106  . Highest education level: High school graduate  Occupational History  . Occupation: disabled   Tobacco Use  . Smoking status: Current Every Day Smoker    Packs/day: 0.25    Types: Cigarettes  . Smokeless tobacco: Never Used  . Tobacco comment: 1-2 cigarettes per day  Vaping Use  . Vaping Use: Never used  Substance and Sexual Activity  . Alcohol use: Yes    Comment: hx of alcohol abuse; 4 drinks per day as of 08/01/19  . Drug use: Yes    Frequency: 2.0 times per week    Types: Marijuana, Heroin, Cocaine    Comment: h/o THC, cocaine, heroin; no longer using since 2011  . Sexual activity: Not Currently  Other Topics Concern  . Not on file  Social History Narrative   Patient lives with his father in [place].    Patient is disabled and has not worked  in over ~10 years.    Patient is close with his family, his childern and siblings.       Social Determinants of Health   Financial Resource Strain: Not on file  Food Insecurity: Not on file  Transportation Needs: Not on file  Physical Activity: Not on file  Stress: Not on file  Social Connections: Not on file  Intimate Partner Violence: Not on file     Review of Systems: General: No chills, fever, night sweats or weight changes  Cardiovascular:  No chest pain, dyspnea on exertion, edema, orthopnea,  palpitations, paroxysmal nocturnal dyspnea Dermatological: No rash, lesions or masses Respiratory: No cough, dyspnea Urologic: No hematuria, dysuria Abdominal: No nausea, vomiting, diarrhea, bright red blood per rectum, melena, or hematemesis Neurologic: No visual changes, weakness, changes in mental status All other systems reviewed and are otherwise negative except as noted above.  Physical Exam: Vitals:   02/16/21 0913  BP: 122/64  Pulse: 78  SpO2: 98%  Weight: 136 lb 9.6 oz (62 kg)  Height: 5\' 6"  (1.676 m)    GEN- NAD, alert and oriented x 3 today.   HEENT: normocephalic, atraumatic; sclera clear, conjunctiva pink; hearing intact; oropharynx clear; neck supple, no JVP Lymph- no cervical lymphadenopathy Lungs- Clear to ausculation bilaterally, normal work of breathing.  No wheezes, rales, rhonchi Heart- Regular rate and rhythm, no murmurs, rubs or gallops, PMI not laterally displaced GI- soft, non-tender, non-distended, bowel sounds present, no hepatosplenomegaly Extremities- no clubbing, cyanosis, or edema; DP/PT/radial pulses 2+ bilaterally MS- no significant deformity or atrophy Skin- warm and dry, no rash or lesion Psych- euthymic mood, full affect Neuro- strength and sensation are intact  EKG is ordered. Personal review of EKG from today shows NSR 78 bpm, QRS 74 ms, PR interval 176 ms  Additional studies reviewed include: Previous EP office notes  Assessment and  Plan:  1.  H/o ETOH and tobacco Cessation again advised, especially in light of recent significant admission  2. HTN Continue current regimen  3. Atypical chest pain Stable as above. No clear exertional symptoms. If worsens could updating ischemic work up. The fact that he tolerated recent critical illness is re-assuring from a ?CAD perspective.   4. Prior cardiac arrest Echo 02/2019 LVEF >65% In the setting of malnutrition, ETOh, and metabolic derangements No indication for ICD or further EP workup.  Lifestyle modification is required No cardiac issues mentioned in review of notes from recent admission despite critical illness  5. ESRD on HD On HD Tues and Saturday. Confirmed with daughter only twice weekly currently, recently uptitrated from once weekly.  Shirley Friar, PA-C  02/16/21 9:18 AM

## 2021-02-16 ENCOUNTER — Encounter: Payer: Self-pay | Admitting: Student

## 2021-02-16 ENCOUNTER — Ambulatory Visit (INDEPENDENT_AMBULATORY_CARE_PROVIDER_SITE_OTHER): Payer: Medicare Other | Admitting: Student

## 2021-02-16 ENCOUNTER — Encounter (INDEPENDENT_AMBULATORY_CARE_PROVIDER_SITE_OTHER): Payer: Self-pay

## 2021-02-16 ENCOUNTER — Other Ambulatory Visit: Payer: Self-pay

## 2021-02-16 ENCOUNTER — Ambulatory Visit: Payer: Medicare Other | Admitting: Family Medicine

## 2021-02-16 VITALS — BP 122/64 | HR 78 | Ht 66.0 in | Wt 136.6 lb

## 2021-02-16 DIAGNOSIS — I10 Essential (primary) hypertension: Secondary | ICD-10-CM | POA: Diagnosis not present

## 2021-02-16 DIAGNOSIS — R0789 Other chest pain: Secondary | ICD-10-CM | POA: Diagnosis not present

## 2021-02-16 DIAGNOSIS — I469 Cardiac arrest, cause unspecified: Secondary | ICD-10-CM

## 2021-02-16 NOTE — Patient Instructions (Signed)
Medication Instructions:  Your physician recommends that you continue on your current medications as directed. Please refer to the Current Medication list given to you today.  *If you need a refill on your cardiac medications before your next appointment, please call your pharmacy*   Lab Work: None If you have labs (blood work) drawn today and your tests are completely normal, you will receive your results only by: Marland Kitchen MyChart Message (if you have MyChart) OR . A paper copy in the mail If you have any lab test that is abnormal or we need to change your treatment, we will call you to review the results.   Follow-Up: At [facility], you and your health needs are our priority.  As part of our continuing mission to provide you with exceptional heart care, we have created designated Provider Care Teams.  These Care Teams include your primary Cardiologist (physician) and Advanced Practice Providers (APPs -  Physician Assistants and Nurse Practitioners) who all work together to provide you with the care you need, when you need it.  We recommend signing up for the patient portal called "MyChart".  Sign up information is provided on this After Visit Summary.  MyChart is used to connect with patients for Virtual Visits (Telemedicine).  Patients are able to view lab/test results, encounter notes, upcoming appointments, etc.  Non-urgent messages can be sent to your provider as well.   To learn more about what you can do with MyChart, go to [email or web address].    Your next appointment:   6 month(s)  The format for your next appointment:   In Person  Provider:   Shirley Friar, PA-C

## 2021-02-23 ENCOUNTER — Ambulatory Visit: Payer: Medicare Other | Admitting: Family Medicine

## 2021-02-26 ENCOUNTER — Telehealth: Payer: Self-pay

## 2021-02-26 NOTE — Telephone Encounter (Signed)
[facility] PT calls nurse line requesting verbal orders for [facility] PT as follows.  1x a week for 6 weeks.   Verbal order given per [facility] protocol.

## 2021-03-02 ENCOUNTER — Other Ambulatory Visit: Payer: Self-pay

## 2021-03-02 ENCOUNTER — Encounter: Payer: Self-pay | Admitting: Family Medicine

## 2021-03-02 ENCOUNTER — Ambulatory Visit (INDEPENDENT_AMBULATORY_CARE_PROVIDER_SITE_OTHER): Payer: Medicare Other | Admitting: Family Medicine

## 2021-03-02 VITALS — BP 114/62 | HR 101 | Wt 137.2 lb

## 2021-03-02 DIAGNOSIS — N186 End stage renal disease: Secondary | ICD-10-CM

## 2021-03-02 DIAGNOSIS — B351 Tinea unguium: Secondary | ICD-10-CM

## 2021-03-02 DIAGNOSIS — Z8673 Personal history of transient ischemic attack (TIA), and cerebral infarction without residual deficits: Secondary | ICD-10-CM | POA: Diagnosis not present

## 2021-03-02 DIAGNOSIS — E119 Type 2 diabetes mellitus without complications: Secondary | ICD-10-CM

## 2021-03-02 DIAGNOSIS — L608 Other nail disorders: Secondary | ICD-10-CM | POA: Diagnosis not present

## 2021-03-02 LAB — POCT GLYCOSYLATED HEMOGLOBIN (HGB A1C): Hemoglobin A1C: 7.8 % — AB (ref 4.0–5.6)

## 2021-03-02 NOTE — Assessment & Plan Note (Addendum)
Will check continuous glucose monitor at next visit Continue Lantus 5u BID, and 3u with lunch Referral for [place]-based Opthalmology annual eye exam RTC in 3 months, sooner if any new concerns

## 2021-03-02 NOTE — Addendum Note (Signed)
Addended by: Drue Novel I on: 03/02/2021 01:56 PM   Modules accepted: Orders

## 2021-03-02 NOTE — Assessment & Plan Note (Signed)
Continue HD Check CBC and Magnesium with HD later today if not already drawn by HD since discharge

## 2021-03-02 NOTE — Assessment & Plan Note (Signed)
Referral to [facility] per pt preference

## 2021-03-02 NOTE — Progress Notes (Signed)
    SUBJECTIVE:   CHIEF COMPLAINT / HPI:   Steven Frey. returns today for follow up after his recent discharge from [facility] on 02/18/11 for a hypoglycemic episode. Since I last saw him he has been admitted to [facility] for ARF, then discharged to SNF after starting HD, then readmitted most recently for hypoglycemia.  T2DM Steven Frey's insulin was adjusted after his hypoglycemic admission. He has stopped taking Glargine and is taking Lispro 5u morning and evening, and 3u at lunch. He reports morning fastings no lower than 75 since discharge, and checks his sugars multiple times a day with highest readings 450-500, and most often sugars between 150-230. He has just qualified for a continuous glucose monitor and will receive this in the coming 1-2 weeks. Daughter is present and is well involved in this patient's care.   ESRD Steven Frey is about to increase to HD three times a week: Tuesday, Thursday, and Saturday, after beginning HD in March. He has been tolerating this alright but does report some mild ankle swelling which fluctuates throughout the week. No SOB or orthopnea.  Toenail Problems Steven Frey also notes that his toenails have had a fungus in them and he would like a referral to podiatry.  PERTINENT  PMH / PSH:  T2DM ESRD Onychomycosis   OBJECTIVE:   BP 114/62   Pulse (!) 101   Wt 137 lb 3.2 oz (62.2 kg)   SpO2 96%   BMI 22.14 kg/m   Physical Exam Constitutional:      Appearance: Normal appearance.  Cardiovascular:     Rate and Rhythm: Normal rate and regular rhythm.     Heart sounds: Normal heart sounds.  Pulmonary:     Effort: Pulmonary effort is normal.     Breath sounds: Normal breath sounds.  Musculoskeletal:     Right lower leg: 1+ Edema present.     Left lower leg: 1+ Edema present.  Skin:    General: Skin is warm and dry.  Neurological:     Mental Status: He is alert and oriented to person, place, and time. Mental status is at baseline.  Psychiatric:         Mood and Affect: Mood normal.        Behavior: Behavior normal.   long, curved, thickened toenails bilateral feet    ASSESSMENT/PLAN:   Type 2 diabetes mellitus (HCC) Will check continuous glucose monitor at next visit Continue Lantus 5u BID, and 3u with lunch Referral for [place]-based Opthalmology annual eye exam RTC in 3 months, sooner if any new concerns  ESRD (end stage renal disease) ([place]) Continue HD Check CBC and Magnesium with HD later today if not already drawn by HD since discharge  Toenail deformity Referral to [facility] Podiatry per pt preference  History of CVA (cerebrovascular accident) Restart aspirin in light of prior CVA (was held during recent hospitalization, but no signs of hemorrhage on imaging)   London Pepper, [place]    Patient seen along with MS3 student [place]. I personally evaluated this patient along with the student, and verified all aspects of the history, physical exam, and medical decision making as documented by the student. I agree with the student's documentation and have made all necessary edits.  Chrisandra Netters, MD  [place]

## 2021-03-02 NOTE — Patient Instructions (Signed)
It was great to see you again today!  Restart aspirin  Ask dialysis if they've checked your CBC and magnesium levels - the hospital wanted these rechecked  Referring to podiatrist and foot doctor.  Follow up with me in 3 months, sooner if needed  Be well, Dr. Ardelia Mems

## 2021-03-08 NOTE — Assessment & Plan Note (Signed)
Restart aspirin in light of prior CVA (was held during recent hospitalization, but no signs of hemorrhage on imaging)

## 2021-04-24 ENCOUNTER — Other Ambulatory Visit: Payer: Self-pay | Admitting: Family Medicine

## 2021-05-03 ENCOUNTER — Other Ambulatory Visit: Payer: Self-pay | Admitting: Family Medicine

## 2021-05-06 MED ORDER — CARVEDILOL 3.125 MG PO TABS: 3.1250 mg | ORAL_TABLET | Freq: Two times a day (BID) | ORAL | 0 refills | Status: DC

## 2021-05-06 MED ORDER — LEVETIRACETAM 250 MG PO TABS: ORAL_TABLET | ORAL | 3 refills | Status: DC

## 2021-05-06 MED ORDER — SERTRALINE HCL 50 MG PO TABS: 150.0000 mg | ORAL_TABLET | Freq: Every day | ORAL | 0 refills | Status: DC

## 2021-05-06 MED ORDER — LEVETIRACETAM 500 MG PO TABS: 500.0000 mg | ORAL_TABLET | Freq: Every morning | ORAL | 3 refills | Status: AC

## 2021-05-24 ENCOUNTER — Telehealth: Payer: Self-pay

## 2021-05-24 NOTE — Telephone Encounter (Signed)
Dr. [name] calls nurse line requesting PCP to call him back at her convenience.   [phone]

## 2021-05-24 NOTE — Telephone Encounter (Signed)
Called Dr. Felipa Emory back and answered his questions. He has been helping care for Mr. Bannister via an in-home primary care service through [place] and needed some background info about patient's chronic conditions.  Leeanne Rio, MD

## 2021-05-26 ENCOUNTER — Ambulatory Visit: Payer: Medicare Other

## 2021-05-26 ENCOUNTER — Other Ambulatory Visit: Payer: Self-pay | Admitting: Family Medicine

## 2021-05-26 NOTE — Telephone Encounter (Signed)
Refills were sent by Dr Ardelia Mems in July 2022 for all 3 requested medications

## 2021-06-10 ENCOUNTER — Ambulatory Visit: Payer: Medicare Other | Admitting: Family Medicine

## 2021-06-16 ENCOUNTER — Telehealth: Payer: Self-pay

## 2021-06-16 NOTE — Telephone Encounter (Signed)
Opened in error.   Amita Atayde C Javiel Canepa, RN  

## 2021-06-16 NOTE — Telephone Encounter (Signed)
Yvetta Coder, [facility] RN with [facility] calls nurse line regarding patient status. Reports that patient has not been to dialysis in over one week. Patient states that he has just not felt like going.   [place] RN educated on the importance of attending dialysis. Patient states that he will try to go tomorrow.   FYI to PCP.   Talbot Grumbling, RN

## 2021-06-16 NOTE — Telephone Encounter (Signed)
Noted. Hopefully patient will increase adherence with HD. He lives in [place] now so does not see me very often; gets admitted to [facility].  Leeanne Rio, MD

## 2021-06-29 ENCOUNTER — Ambulatory Visit: Payer: Medicare Other | Admitting: Family Medicine

## 2021-07-12 ENCOUNTER — Ambulatory Visit: Payer: Medicare Other

## 2021-08-02 ENCOUNTER — Other Ambulatory Visit: Payer: Self-pay | Admitting: Family Medicine

## 2021-08-22 ENCOUNTER — Other Ambulatory Visit: Payer: Self-pay | Admitting: Family Medicine

## 2021-09-14 ENCOUNTER — Ambulatory Visit (INDEPENDENT_AMBULATORY_CARE_PROVIDER_SITE_OTHER): Payer: Medicare Other | Admitting: Family Medicine

## 2021-09-14 ENCOUNTER — Other Ambulatory Visit: Payer: Self-pay

## 2021-09-14 VITALS — BP 156/69 | HR 95 | Wt 130.8 lb

## 2021-09-14 DIAGNOSIS — F17200 Nicotine dependence, unspecified, uncomplicated: Secondary | ICD-10-CM | POA: Diagnosis not present

## 2021-09-14 DIAGNOSIS — I10 Essential (primary) hypertension: Secondary | ICD-10-CM

## 2021-09-14 DIAGNOSIS — E119 Type 2 diabetes mellitus without complications: Secondary | ICD-10-CM

## 2021-09-14 DIAGNOSIS — R634 Abnormal weight loss: Secondary | ICD-10-CM | POA: Diagnosis not present

## 2021-09-14 LAB — POCT GLYCOSYLATED HEMOGLOBIN (HGB A1C): HbA1c, POC (controlled diabetic range): 5.9 % (ref 0.0–7.0)

## 2021-09-14 NOTE — Assessment & Plan Note (Signed)
A1c is too low.  Discussed lowering insulin dose.  He would like to do so gradually.  See after visit summary

## 2021-09-14 NOTE — Assessment & Plan Note (Signed)
His is eating ok. Has a stable home situation.   Will continue to try to gain weight.

## 2021-09-14 NOTE — Assessment & Plan Note (Signed)
Mildly elevated today. He did not bring his medications and is unsure of exactly his dosages.  Monitor at home and call if not controlled.

## 2021-09-14 NOTE — Patient Instructions (Signed)
Good to see you today - Thank you for coming in  Things we discussed today:  Diabetes  - cut back on the insulin to 3 units or less three times a day  - If you have low blood sugar ok to stop completely - Come back 3 months for an A1c check   Hypertension - Goal is less than 140/90  - Call me if regularly greater than that  Keep eating  Stop smoking   Please always bring your medication bottles  I will check on the [facility] paperwork .  If you dont hear from me in 2 weeks please call  Come back to see me or Dr Ardelia Mems in 3 months

## 2021-09-14 NOTE — Assessment & Plan Note (Signed)
Continues to smoke about 3-5 per day.  Discussed good reasons to stop

## 2021-09-14 NOTE — Progress Notes (Signed)
    SUBJECTIVE:   CHIEF COMPLAINT / HPI:   Comes in with his daughter.  Was discharged from rehab at [facility] 2 weeks ago.  He had been there for deconditioning after missing dialysis and sufffering a R lower leg fracture.  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  No recent low blood sugar but has had in past. Has a CBG monitor. Daughter is present and is well involved in this patient's care.   HTN Relates his blood pressure when checked by home doctor and Physical Therapy is in the 120s.   ESRD Going to dialysis regularly. His dtrs take him or he can ride medical transport.  Has labs there regularly  Mobility Has a quad cane and is getting Physical Therapy at home once a week.   Has a request in for a WC.    Did not bring medications and is not completely sure of them   PERTINENT  PMH / PSH:  Living with his 2 dtrs in [place].  Wants to keep Dr Ardelia Mems as his PCP. Has had home doctor organization visit him recently   PERTINENT  PMH / PSH: Last seen by Dr Ardelia Mems May 2022.   Recent visits to Ortho for leg fracture, Oncology for gammopathy   OBJECTIVE:   BP (!) 156/69   Pulse 95   Wt 130 lb 12.8 oz (59.3 kg)   SpO2 100%   BMI 21.11 kg/m   Present with his daughter Soft spoken Lungs - decreased sounds bilaterally Heart - Regular rate and rhythm.  No murmurs, gallops or rubs.    Decreased mobility on R side.    ASSESSMENT/PLAN:   HYPERTENSION, BENIGN Mildly elevated today. He did not bring his medications and is unsure of exactly his dosages.  Monitor at home and call if not controlled.    Type 2 diabetes mellitus (HCC) A1c is too low.  Discussed lowering insulin dose.  He would like to do so gradually.  See after visit summary   CIGARETTE SMOKER Continues to smoke about 3-5 per day.  Discussed good reasons to stop   Abnormal weight loss His is eating ok. Has a stable home  situation.   Will continue to try to gain weight.       Lind Covert, MD [place]

## 2021-09-28 ENCOUNTER — Telehealth: Payer: Self-pay

## 2021-09-28 NOTE — Telephone Encounter (Signed)
Patient's daughter calls nurse line regarding request for wheelchair from last appointment on 12/6. Daughter reports that she has not received an update on this request.   Per chart review, I am unable to find order for wheelchair. Will forward to Dr. Erin Hearing for order. Please see below for specifics if this is patient's first wheelchair.   Place a?Referral to Neuro Rehab?and write in comments:?Manual Wheelchair or Power Wheelchair Eval.?Neuro Rehab uses different companies based on patients' needs.   Talbot Grumbling, RN

## 2021-09-30 NOTE — Telephone Encounter (Signed)
Called went straight to VM Left message to call back to discuss WC I do not have any WC request forms for him.

## 2021-10-01 ENCOUNTER — Encounter: Payer: Self-pay | Admitting: Family Medicine

## 2021-10-01 NOTE — Telephone Encounter (Signed)
Called [phone].    Someone answered x 2 but did not speak after saying hello.  Will send [place]

## 2021-10-15 ENCOUNTER — Other Ambulatory Visit: Payer: Self-pay | Admitting: Family Medicine

## 2022-03-15 ENCOUNTER — Encounter: Payer: Self-pay | Admitting: *Deleted

## 2022-07-10 DEATH — deceased
